# Patient Record
Sex: Male | Born: 1945
Health system: Southern US, Academic
[De-identification: ages and names within clinical notes are randomized; demographics above are authoritative.]

## PROBLEM LIST (undated history)

## (undated) ENCOUNTER — Telehealth

## (undated) ENCOUNTER — Encounter

## (undated) ENCOUNTER — Telehealth: Attending: Podiatrist

## (undated) ENCOUNTER — Telehealth: Attending: Nurse Practitioner

## (undated) ENCOUNTER — Other Ambulatory Visit

## (undated) ENCOUNTER — Encounter: Attending: Student in an Organized Health Care Education/Training Program

## (undated) ENCOUNTER — Encounter: Attending: Family Medicine

## (undated) ENCOUNTER — Encounter: Attending: Podiatrist

## (undated) ENCOUNTER — Telehealth: Attending: Cardiovascular Disease

## (undated) ENCOUNTER — Telehealth: Attending: Family Medicine

## (undated) ENCOUNTER — Encounter: Attending: Physician Assistant

## (undated) ENCOUNTER — Telehealth: Attending: Student in an Organized Health Care Education/Training Program

## (undated) ENCOUNTER — Encounter: Payer: MEDICARE | Attending: Student in an Organized Health Care Education/Training Program

## (undated) ENCOUNTER — Other Ambulatory Visit: Attending: Family Medicine

## (undated) ENCOUNTER — Ambulatory Visit

## (undated) ENCOUNTER — Encounter: Attending: "Endocrinology

## (undated) ENCOUNTER — Encounter: Attending: Nurse Practitioner

## (undated) ENCOUNTER — Encounter: Payer: MEDICARE | Attending: Nurse Practitioner

## (undated) ENCOUNTER — Inpatient Hospital Stay

## (undated) ENCOUNTER — Telehealth: Attending: "Endocrinology

## (undated) DIAGNOSIS — I10 Essential (primary) hypertension: Secondary | ICD-10-CM

## (undated) DIAGNOSIS — C801 Malignant (primary) neoplasm, unspecified: Secondary | ICD-10-CM

## (undated) DIAGNOSIS — I219 Acute myocardial infarction, unspecified: Secondary | ICD-10-CM

## (undated) DIAGNOSIS — N189 Chronic kidney disease, unspecified: Secondary | ICD-10-CM

## (undated) DIAGNOSIS — D759 Disease of blood and blood-forming organs, unspecified: Secondary | ICD-10-CM

## (undated) DIAGNOSIS — E119 Type 2 diabetes mellitus without complications: Secondary | ICD-10-CM

## (undated) DIAGNOSIS — T4145XA Adverse effect of unspecified anesthetic, initial encounter: Secondary | ICD-10-CM

## (undated) HISTORY — PX: BACK SURGERY: SHX140

---

## 1999-02-19 ENCOUNTER — Ambulatory Visit (HOSPITAL_COMMUNITY): Admission: RE | Admit: 1999-02-19 | Discharge: 1999-02-19 | Payer: Self-pay | Admitting: Family Medicine

## 1999-02-19 ENCOUNTER — Encounter: Payer: Self-pay | Admitting: Family Medicine

## 2014-04-25 DIAGNOSIS — T8859XA Other complications of anesthesia, initial encounter: Secondary | ICD-10-CM

## 2014-04-25 DIAGNOSIS — N189 Chronic kidney disease, unspecified: Secondary | ICD-10-CM

## 2014-04-25 HISTORY — DX: Chronic kidney disease, unspecified: N18.9

## 2014-04-25 HISTORY — PX: CORONARY ARTERY BYPASS GRAFT: SHX141

## 2014-04-25 HISTORY — DX: Other complications of anesthesia, initial encounter: T88.59XA

## 2014-11-13 ENCOUNTER — Other Ambulatory Visit: Payer: Self-pay | Admitting: Neurological Surgery

## 2014-11-18 ENCOUNTER — Encounter (HOSPITAL_COMMUNITY): Payer: Self-pay | Admitting: Family Medicine

## 2014-11-18 ENCOUNTER — Emergency Department (HOSPITAL_COMMUNITY): Payer: Medicare (Managed Care)

## 2014-11-18 ENCOUNTER — Inpatient Hospital Stay (HOSPITAL_COMMUNITY): Payer: Medicare (Managed Care)

## 2014-11-18 ENCOUNTER — Observation Stay (HOSPITAL_COMMUNITY)
Admission: EM | Admit: 2014-11-18 | Discharge: 2014-11-20 | Disposition: A | Discharge status: Home / Self Care | Payer: Medicare (Managed Care) | Attending: Internal Medicine | Admitting: Internal Medicine

## 2014-11-18 ENCOUNTER — Encounter (HOSPITAL_COMMUNITY): Payer: Self-pay | Admitting: *Deleted

## 2014-11-18 DIAGNOSIS — M542 Cervicalgia: Secondary | ICD-10-CM | POA: Diagnosis present

## 2014-11-18 DIAGNOSIS — I129 Hypertensive chronic kidney disease with stage 1 through stage 4 chronic kidney disease, or unspecified chronic kidney disease: Secondary | ICD-10-CM | POA: Insufficient documentation

## 2014-11-18 DIAGNOSIS — R42 Dizziness and giddiness: Secondary | ICD-10-CM

## 2014-11-18 DIAGNOSIS — N189 Chronic kidney disease, unspecified: Secondary | ICD-10-CM | POA: Diagnosis not present

## 2014-11-18 DIAGNOSIS — M5012 Cervical disc disorder with radiculopathy, mid-cervical region: Secondary | ICD-10-CM | POA: Insufficient documentation

## 2014-11-18 DIAGNOSIS — Z794 Long term (current) use of insulin: Secondary | ICD-10-CM

## 2014-11-18 DIAGNOSIS — Z79899 Other long term (current) drug therapy: Secondary | ICD-10-CM | POA: Insufficient documentation

## 2014-11-18 DIAGNOSIS — Z951 Presence of aortocoronary bypass graft: Secondary | ICD-10-CM | POA: Diagnosis not present

## 2014-11-18 DIAGNOSIS — IMO0001 Reserved for inherently not codable concepts without codable children: Secondary | ICD-10-CM

## 2014-11-18 DIAGNOSIS — N179 Acute kidney failure, unspecified: Secondary | ICD-10-CM | POA: Insufficient documentation

## 2014-11-18 DIAGNOSIS — Z85820 Personal history of malignant melanoma of skin: Secondary | ICD-10-CM | POA: Diagnosis not present

## 2014-11-18 DIAGNOSIS — I10 Essential (primary) hypertension: Secondary | ICD-10-CM | POA: Diagnosis present

## 2014-11-18 DIAGNOSIS — M4802 Spinal stenosis, cervical region: Secondary | ICD-10-CM | POA: Diagnosis not present

## 2014-11-18 DIAGNOSIS — R7989 Other specified abnormal findings of blood chemistry: Secondary | ICD-10-CM | POA: Diagnosis present

## 2014-11-18 DIAGNOSIS — Z9641 Presence of insulin pump (external) (internal): Secondary | ICD-10-CM

## 2014-11-18 DIAGNOSIS — R55 Syncope and collapse: Secondary | ICD-10-CM

## 2014-11-18 DIAGNOSIS — M4712 Other spondylosis with myelopathy, cervical region: Secondary | ICD-10-CM | POA: Diagnosis not present

## 2014-11-18 DIAGNOSIS — Z419 Encounter for procedure for purposes other than remedying health state, unspecified: Secondary | ICD-10-CM

## 2014-11-18 DIAGNOSIS — Z981 Arthrodesis status: Secondary | ICD-10-CM | POA: Insufficient documentation

## 2014-11-18 DIAGNOSIS — M5002 Cervical disc disorder with myelopathy, mid-cervical region: Secondary | ICD-10-CM | POA: Diagnosis not present

## 2014-11-18 DIAGNOSIS — I2510 Atherosclerotic heart disease of native coronary artery without angina pectoris: Secondary | ICD-10-CM | POA: Insufficient documentation

## 2014-11-18 DIAGNOSIS — E119 Type 2 diabetes mellitus without complications: Secondary | ICD-10-CM | POA: Insufficient documentation

## 2014-11-18 DIAGNOSIS — I252 Old myocardial infarction: Secondary | ICD-10-CM | POA: Diagnosis not present

## 2014-11-18 HISTORY — DX: Type 2 diabetes mellitus without complications: E11.9

## 2014-11-18 HISTORY — DX: Essential (primary) hypertension: I10

## 2014-11-18 LAB — COMPREHENSIVE METABOLIC PANEL
ALT: 19 U/L (ref 0–53)
ANION GAP: 7 (ref 5–15)
AST: 21 U/L (ref 0–37)
Albumin: 3.2 g/dL — ABNORMAL LOW (ref 3.5–5.2)
Alkaline Phosphatase: 64 U/L (ref 39–117)
BUN: 32 mg/dL — AB (ref 6–23)
CHLORIDE: 107 mmol/L (ref 96–112)
CO2: 22 mmol/L (ref 19–32)
CREATININE: 1.41 mg/dL — AB (ref 0.50–1.35)
Calcium: 8.5 mg/dL (ref 8.4–10.5)
GFR, EST AFRICAN AMERICAN: 58 mL/min — AB (ref >90)
GFR, EST NON AFRICAN AMERICAN: 50 mL/min — AB (ref >90)
Glucose, Bld: 408 mg/dL — ABNORMAL HIGH (ref 70–99)
Potassium: 4.2 mmol/L (ref 3.5–5.1)
SODIUM: 136 mmol/L (ref 135–145)
Total Bilirubin: 0.4 mg/dL (ref 0.3–1.2)
Total Protein: 5.5 g/dL — ABNORMAL LOW (ref 6.0–8.3)

## 2014-11-18 LAB — GLUCOSE, CAPILLARY
GLUCOSE-CAPILLARY: 220 mg/dL — AB (ref 70–99)
Glucose-Capillary: 350 mg/dL — ABNORMAL HIGH (ref 70–99)

## 2014-11-18 LAB — CBC WITH DIFFERENTIAL/PLATELET
BASOS PCT: 0 % (ref 0–1)
Basophils Absolute: 0 10*3/uL (ref 0.0–0.1)
EOS ABS: 0.1 10*3/uL (ref 0.0–0.7)
Eosinophils Relative: 2 % (ref 0–5)
HCT: 40 % (ref 39.0–52.0)
HEMOGLOBIN: 14 g/dL (ref 13.0–17.0)
Lymphocytes Relative: 24 % (ref 12–46)
Lymphs Abs: 1.3 10*3/uL (ref 0.7–4.0)
MCH: 32.9 pg (ref 26.0–34.0)
MCHC: 35 g/dL (ref 30.0–36.0)
MCV: 94.1 fL (ref 78.0–100.0)
Monocytes Absolute: 0.7 10*3/uL (ref 0.1–1.0)
Monocytes Relative: 13 % — ABNORMAL HIGH (ref 3–12)
NEUTROS PCT: 61 % (ref 43–77)
Neutro Abs: 3.3 10*3/uL (ref 1.7–7.7)
Platelets: 195 10*3/uL (ref 150–400)
RBC: 4.25 MIL/uL (ref 4.22–5.81)
RDW: 13 % (ref 11.5–15.5)
WBC: 5.4 10*3/uL (ref 4.0–10.5)

## 2014-11-18 LAB — CBG MONITORING, ED: Glucose-Capillary: 78 mg/dL (ref 70–99)

## 2014-11-18 LAB — TROPONIN I: Troponin I: <0.03 ng/mL (ref <0.031)

## 2014-11-18 LAB — I-STAT TROPONIN, ED: Troponin i, poc: 0.01 ng/mL (ref 0.00–0.08)

## 2014-11-18 LAB — SURGICAL PCR SCREEN
MRSA, PCR: NEGATIVE
STAPHYLOCOCCUS AUREUS: NEGATIVE

## 2014-11-18 MED ORDER — MECLIZINE HCL 12.5 MG PO TABS
12.5000 mg | ORAL_TABLET | Freq: Two times a day (BID) | ORAL | Status: DC
  Administered 2014-11-18 – 2014-11-20 (×4): 12.5 mg via ORAL
  Filled 2014-11-18 (×5): qty 1

## 2014-11-18 MED ORDER — HEPARIN SODIUM (PORCINE) 5000 UNIT/ML IJ SOLN
5000.0000 [IU] | Freq: Three times a day (TID) | INTRAMUSCULAR | Status: DC
  Administered 2014-11-18 – 2014-11-20 (×3): 5000 [IU] via SUBCUTANEOUS
  Filled 2014-11-18 (×5): qty 1

## 2014-11-18 MED ORDER — INSULIN PUMP
Freq: Three times a day (TID) | SUBCUTANEOUS | Status: DC
  Administered 2014-11-18: 23:00:00 via SUBCUTANEOUS
  Filled 2014-11-18: qty 1

## 2014-11-18 MED ORDER — ASPIRIN 81 MG PO CHEW: 81.0000 mg | CHEWABLE_TABLET | Freq: Every day | ORAL | Status: DC

## 2014-11-18 MED ORDER — GABAPENTIN 300 MG PO CAPS
300.0000 mg | ORAL_CAPSULE | Freq: Every day | ORAL | Status: DC
  Administered 2014-11-18 – 2014-11-20 (×3): 300 mg via ORAL
  Filled 2014-11-18 (×3): qty 1

## 2014-11-18 MED ORDER — OXYCODONE-ACETAMINOPHEN 10-325 MG PO TABS
1.0000 | ORAL_TABLET | ORAL | Status: DC | PRN
Start: 2014-11-18 — End: 2014-11-18

## 2014-11-18 MED ORDER — SODIUM CHLORIDE 0.9 % IV SOLN
INTRAVENOUS | Status: DC
  Administered 2014-11-18: 23:00:00 via INTRAVENOUS
  Administered 2014-11-19: 1000 mL via INTRAVENOUS

## 2014-11-18 MED ORDER — INSULIN ASPART 100 UNIT/ML ~~LOC~~ SOLN
0.0000 [IU] | Freq: Three times a day (TID) | SUBCUTANEOUS | Status: DC
  Administered 2014-11-20: 5 [IU] via SUBCUTANEOUS
  Administered 2014-11-20: 9 [IU] via SUBCUTANEOUS

## 2014-11-18 MED ORDER — INSULIN PUMP
Freq: Three times a day (TID) | SUBCUTANEOUS | Status: DC
  Filled 2014-11-18: qty 1

## 2014-11-18 MED ORDER — ALUM & MAG HYDROXIDE-SIMETH 200-200-20 MG/5ML PO SUSP: 30.0000 mL | Freq: Four times a day (QID) | ORAL | Status: DC | PRN

## 2014-11-18 MED ORDER — DEXTROSE-NACL 5-0.45 % IV SOLN: INTRAVENOUS | Status: DC

## 2014-11-18 MED ORDER — ONDANSETRON HCL 4 MG PO TABS: 4.0000 mg | ORAL_TABLET | Freq: Four times a day (QID) | ORAL | Status: DC | PRN

## 2014-11-18 MED ORDER — SODIUM CHLORIDE 0.9 % IJ SOLN
3.0000 mL | Freq: Two times a day (BID) | INTRAMUSCULAR | Status: DC
  Administered 2014-11-18 – 2014-11-20 (×3): 3 mL via INTRAVENOUS

## 2014-11-18 MED ORDER — OXYCODONE HCL 5 MG PO TABS
5.0000 mg | ORAL_TABLET | ORAL | Status: DC | PRN
  Administered 2014-11-18: 5 mg via ORAL
  Filled 2014-11-18 (×2): qty 1

## 2014-11-18 MED ORDER — METOPROLOL TARTRATE 25 MG PO TABS
25.0000 mg | ORAL_TABLET | Freq: Two times a day (BID) | ORAL | Status: DC
  Administered 2014-11-18 – 2014-11-20 (×4): 25 mg via ORAL
  Filled 2014-11-18 (×5): qty 1

## 2014-11-18 MED ORDER — PANTOPRAZOLE SODIUM 40 MG PO TBEC
40.0000 mg | DELAYED_RELEASE_TABLET | Freq: Every day | ORAL | Status: DC
  Administered 2014-11-19 – 2014-11-20 (×2): 40 mg via ORAL
  Filled 2014-11-18 (×2): qty 1

## 2014-11-18 MED ORDER — SUCCINYLCHOLINE CHLORIDE 20 MG/ML IJ SOLN: INTRAMUSCULAR | Status: DC | PRN

## 2014-11-18 MED ORDER — PRAVASTATIN SODIUM 40 MG PO TABS
40.0000 mg | ORAL_TABLET | Freq: Every day | ORAL | Status: DC
  Administered 2014-11-18: 40 mg via ORAL
  Filled 2014-11-18 (×3): qty 1

## 2014-11-18 MED ORDER — SODIUM CHLORIDE 0.9 % IV SOLN: INTRAVENOUS | Status: DC

## 2014-11-18 MED ORDER — INSULIN REGULAR BOLUS VIA INFUSION
0.0000 [IU] | Freq: Three times a day (TID) | INTRAVENOUS | Status: DC
  Filled 2014-11-18: qty 10

## 2014-11-18 MED ORDER — ONDANSETRON HCL 4 MG/2ML IJ SOLN: 4.0000 mg | Freq: Four times a day (QID) | INTRAMUSCULAR | Status: DC | PRN

## 2014-11-18 MED ORDER — DOCUSATE SODIUM 100 MG PO CAPS
100.0000 mg | ORAL_CAPSULE | Freq: Two times a day (BID) | ORAL | Status: DC
  Administered 2014-11-18 – 2014-11-20 (×3): 100 mg via ORAL
  Filled 2014-11-18 (×5): qty 1

## 2014-11-18 MED ORDER — SODIUM CHLORIDE 0.9 % IV SOLN
INTRAVENOUS | Status: DC
  Filled 2014-11-18: qty 2.5

## 2014-11-18 MED ORDER — DEXTROSE 50 % IV SOLN: 25.0000 mL | INTRAVENOUS | Status: DC | PRN

## 2014-11-18 MED ORDER — ACETAMINOPHEN 325 MG PO TABS
650.0000 mg | ORAL_TABLET | Freq: Four times a day (QID) | ORAL | Status: DC | PRN
  Administered 2014-11-19 (×2): 650 mg via ORAL
  Filled 2014-11-18 (×2): qty 2

## 2014-11-18 MED ORDER — ETOMIDATE 2 MG/ML IV SOLN: INTRAVENOUS | Status: DC | PRN

## 2014-11-18 MED ORDER — OXYCODONE-ACETAMINOPHEN 5-325 MG PO TABS
1.0000 | ORAL_TABLET | ORAL | Status: DC | PRN
  Administered 2014-11-18: 1 via ORAL
  Filled 2014-11-18 (×2): qty 1

## 2014-11-18 MED ORDER — ACETAMINOPHEN 650 MG RE SUPP: 650.0000 mg | Freq: Four times a day (QID) | RECTAL | Status: DC | PRN

## 2014-11-18 MED ORDER — TAMSULOSIN HCL 0.4 MG PO CAPS
0.4000 mg | ORAL_CAPSULE | Freq: Every day | ORAL | Status: DC
  Administered 2014-11-18: 0.4 mg via ORAL
  Filled 2014-11-18 (×3): qty 1

## 2014-11-18 NOTE — Progress Notes (Signed)
Per ED notes written today, John Patton had open heart surgery Oct 2016.  I called John Patton at Dr Clarice Pole office and ask for any  information from that surgery.  John Patton reported that they did not have any information, but she would work on it.  I went to  Ed and saw patient and had him sign release t to send to [facility] and  Dr Truman Hayward, cardiothoracic surgeon.   I faxed request for information to both locations.  John Patton said that his post op visit, I help a visitor by getting a wheel chair and pushing him to the office he was going to be seen in.  "It was a good distance and I did fine, Dr Truman Hayward happened to witness this and said that I did not need any other testing since I did this withtout any discomfort or SOB and he released me."  John Patton is not sure who cardiologist is, "someone in Dr Marguerita Beards office."

## 2014-11-18 NOTE — ED Notes (Signed)
Spoke to [employer] Aware of pt's cbg and that pt is holding shortly for MD to eval prior to transport floor

## 2014-11-18 NOTE — ED Notes (Signed)
Paged MD to make aware of cbg and for her to come see pt prior to transport to floor per her request. Will hold pt in ED for short time

## 2014-11-18 NOTE — Progress Notes (Signed)
Pt going for sx tomorrow per note. Pt with no orders for NPO after midnight, has order for insulin pump as well as SSI. K Baltazar Najjar paged and ordered to stop insulin pump at midnight and check pt CBG Q4. Will continue to monitor. Ronnette Hila, RN

## 2014-11-18 NOTE — Consult Note (Signed)
CHIEF COMPLAINT:                                          Dysesthesias in the upper extremities, neck pain.  HISTORY OF PRESENT ILLNESS:                     John Patton is an old patient of mine who I had seen years ago for spondylosis of the cervical spine.  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  Mr. Frisina tells me that last year he had open heart surgery.  During his emergence from surgery he recalls becoming aware that his neck was hyperextended while he still had a breathing tube and he was feeling a glass like sensation of pins being hooked in the back of his neck.  He struggled to try to get himself free and he notes that this only yielded more discomfort.  Ultimately he recovered from that surgery but he notes that since that time he has been having chronic severe migraines with continued dysesthesias.  He was worked up by a physician locally and an MRI of his neck was performed on March 25 of this year.  This study demonstrates that Mr. 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  I demonstrated the findings to him.  REVIEW OF SYSTEMS:                                    Notable for hearing loss, balance disturbance, high blood pressure, arm weakness, neck pain, diabetes being insulin dependent using a pump.  He has a history of some skin disease and skin cancers resected a couple of times.  He also notes that he has the chronic headaches.  PAST MEDICAL HISTORY:                                Reveals that John Patton has had significant spondylosis in his neck.  He has had coronary artery disease requiring heart surgery in October of last year.  . Medications and Allergies:  His current medications include gabapentin, glucose strips for checking his sugar, insulin, losartan, metoprolol, omeprazole, oxycodone, pravastatin, sildenafil, tamsulosin, topiramate.  He  notes allergies to metformin which gives him stomach reaction.  He does not tolerate nonsteroidal anti-inflammatories.  PHYSICAL EXAMINATION:                                On examination, I note that [name] has Lhermitte's phenomenon every time he turns his neck slightly to the right into the left.  His motor function in the upper extremities reveal 4/5 strength in his grips, his intrinsics, his deltoids, his biceps and his triceps.  He has absent reflexes in the upper extremities and in the lower extremities.  He walks with a mildly wide-based gait.  IMPRESSION:  At this point given the fact that he is having significant signs of myelopathy, I have advised [name] that he should undergo one level anterior decompression and arthrodesis at the level of C4-C5.  The surgery would be not unlike what he had experienced previously where we removed the disk in its entirety, put a bone graft in and a small titanium plate to hold his neck together.  Surgery would be done through the left side of the neck.  Because he is having significant though modest symptoms, I suggested that we proceed with this at the earliest convenience.  My concern is though that John Patton could have some modest injury that could result in significant spinal cord injury, possibly even paralysis. He is scheduled for the surgery tomorrow.  He presented to the emergency room today having had several episodes of falling down. He describes symptoms that may be consistent with vertigo but is unclear the exact nature of his episodes. He has had recent open heart surgery a year ago. Some preoperative evaluation would be appropriate as directed by the hospitalist service. I'm hopeful that we will be able to proceed with a surgical decompression tomorrow as planned. Agree with proceeding with an MRI of the brain.

## 2014-11-18 NOTE — H&P (Signed)
[facility] History and Physical  John Patton CVE:[id number] DOB: August 21, 1945 DOA: 11/18/2014  Referring physician: Dr Ezequiel Essex PCP: No primary care provider on file.   Chief Complaint: presyncope/vertigo  HPI: John Patton is a 69 y.o. male with a PMH of hypertension, diabetes (insulin pump), CABG, and neck pain.  Patient states upon awakening this morning the room started spinning and he fell to the ground.  He did not lose consciousness, and reports that he was able to gradually "sit down".  He has been having episodes where he feels like the room is spinning since February 2016.  The episodes last 15-20 minutes before he feels like he is able to safely walk again.  He can not identify any type of prodrome associated with the falls.  He believes that it is due to a neck injury that he sustained during his CABG last October.  He lives in [place], and is in [place] to undergo anterior decompression and arthrodesis at the level of C4-C5  with Dr Ellene Route which is scheduled for tomorrow.    In the ER today(11/18/14) he presented with a blood glucose today of 408.  A CT of the cervical spine was performed with no evidence of intracranial injury or cervical spine fracture.  Some degenerative changes noted.  No evidence of intracranial injury on head CT.    Review of Systems:  Constitutional:  No weight loss, night sweats, Fevers, chills, fatigue.  HEENT:  + headaches,No Difficulty swallowing, Sore throat,  No sneezing, itching, ear ache, nasal congestion, post nasal drip,  Cardio-vascular:  No chest pain, Orthopnea,  swelling in lower extremities, anasarca, palpitations GI:  No heartburn, indigestion, abdominal pain, nausea, vomiting, diarrhea, loss of appetite  Resp:  No shortness of breath with exertion or at rest  Skin:  no rash or lesions.  GU:  no dysuria, change in color of urine, no urgency or frequency.   Musculoskeletal:  No joint pain or swelling. No decreased range of  motion.  + right sided upper and lower extremity weakness, +neck pain Psych:  No change in mood or affect. No depression or anxiety. No memory loss.   Past Medical History  Diagnosis Date  . Hypertension   . Diabetes mellitus without complication   . Complication of anesthesia 04/2014    "while being extubated, they broke my previous cervical fracture."   Past Surgical History  Procedure Laterality Date  . Back surgery     Social History:  reports that he has never smoked. He does not have any smokeless tobacco history on file. He reports that he does not drink alcohol. His drug history is not on file.   Patient does not use recreational drugs.  He used to drink wine regularly- but reports he no longer drinks.  Active Allergies: Promethazine   Family history:  Prior to Admission medications   Medication Sig Start Date End Date Taking? Authorizing Provider  gabapentin (NEURONTIN) 300 MG capsule Take 300 mg by mouth daily. 09/30/14  Yes Historical Provider, MD  HYDROcodone-acetaminophen (NORCO/VICODIN) 5-325 MG per tablet Take 1 tablet by mouth every 6 (six) hours as needed. pain 10/07/14  Yes Historical Provider, MD  losartan (COZAAR) 100 MG tablet Take 100 mg by mouth daily. 11/15/14  Yes Historical Provider, MD  metoprolol tartrate (LOPRESSOR) 25 MG tablet Take 25 mg by mouth 2 (two) times daily. 11/15/14  Yes Historical Provider, MD  omeprazole (PRILOSEC) 40 MG capsule Take 40 mg by mouth daily as needed. heartburn  10/04/14  Yes Historical Provider, MD  oxyCODONE-acetaminophen (PERCOCET) 10-325 MG per tablet Take 1 tablet by mouth every 4 (four) hours as needed. pain 11/15/14  Yes Historical Provider, MD  pravastatin (PRAVACHOL) 40 MG tablet Take 40 mg by mouth daily. 10/07/14  Yes Historical Provider, MD  tamsulosin (FLOMAX) 0.4 MG CAPS capsule Take 0.4 mg by mouth daily. 09/30/14  Yes Historical Provider, MD   Physical Exam: Filed Vitals:   11/18/14 0940 11/18/14 0945 11/18/14 1100  11/18/14 1140  BP: 149/77 140/76 166/103 189/89  Pulse: 70 69 64 62  Temp: 97.9 F (36.6 C)     Resp: 18   18  Height: 5\' 10"  (1.778 m)     Weight: 82.555 kg (182 lb)     SpO2: 96% 94% 98% 98%    Wt Readings from Last 3 Encounters:  11/18/14 82.555 kg (182 lb)    General:  Patient is alert and oriented sitting up in bed eating a sandwich.   Eyes: PERRL, normal lids, irises & conjunctiva ENT: grossly normal hearing,+dry lips & tongue Neck: no LAD, masses or thyromegaly Cardiovascular: RRR, no m/r/g. No LE edema.  Respiratory: CTA bilaterally, no w/r/r. Normal air movement bilaterally.  Abdomen: soft, ntnd.  No organomegaly.   Skin: no rash or induration noted  Musculoskeletal: + Right sided upper and lower extremity weakness  Psychiatric: grossly normal mood and affect, speech fluent and appropriate Neurologic: grossly non-focal.          Labs on Admission:  Basic Metabolic Panel:  Recent Labs Lab 11/18/14 1026  NA 136  K 4.2  CL 107  CO2 22  GLUCOSE 408*  BUN 32*  CREATININE 1.41*  CALCIUM 8.5   Liver Function Tests:  Recent Labs Lab 11/18/14 1026  AST 21  ALT 19  ALKPHOS 64  BILITOT 0.4  PROT 5.5*  ALBUMIN 3.2*   CBC:  Recent Labs Lab 11/18/14 1026  WBC 5.4  NEUTROABS 3.3  HGB 14.0  HCT 40.0  MCV 94.1  PLT 195   Cardiac Enzymes:  Recent Labs Lab 11/18/14 1026  TROPONINI <0.03    Radiological Exams on Admission: Dg Chest 2 View  11/18/2014   CLINICAL DATA:  Preoperative examination. Patient for cervical surgery tomorrow.  EXAM: CHEST  2 VIEW  COMPARISON:  None.  FINDINGS: The patient is status post CABG. The lungs are clear. Heart size is normal. No pneumothorax or pleural effusion. Remote right seventh rib fracture is noted.  IMPRESSION: No acute disease.   Electronically Signed   By: Inge Rise M.D.   On: 11/18/2014 11:20   Ct Head Wo Contrast  11/18/2014   CLINICAL DATA:  Fall with dizziness and headache. Neck pain. Initial  encounter.  EXAM: CT HEAD WITHOUT CONTRAST  CT CERVICAL SPINE WITHOUT CONTRAST  TECHNIQUE: Multidetector CT imaging of the head and cervical spine was performed following the standard protocol without intravenous contrast. Multiplanar CT image reconstructions of the cervical spine were also generated.  COMPARISON:  None.  FINDINGS: CT HEAD FINDINGS  Skull and Sinuses:Negative for fracture or destructive process. The mastoids, middle ears, and imaged paranasal sinuses are clear.  Orbits: No acute abnormality.  Brain: No evidence of acute infarction, hemorrhage, hydrocephalus, or mass lesion/mass effect. Few small low densities in the bilateral centrum semiovale, nonspecific but usually from chronic small vessel disease .  CT CERVICAL SPINE FINDINGS  No evidence of acute fracture or traumatic malalignment. No prevertebral edema or gross cervical canal hematoma.  Status post  C5-6 and C7-T1 discectomy with complete bony fusion. Attempted discectomy at C6-7, but the disc space is open and degenerated with left uncovertebral spurring causing moderate foramina stenosis. There is also a notable left paracentral disc protrusion at C4-5, contacting and deforming the cord.  IMPRESSION: 1. No evidence of intracranial injury or cervical spine fracture. 2. Cervical spine postoperative and degenerative changes noted above, including a C4-5 disc herniation with moderate canal stenosis. Reference outside cervical spine MRI 10/18/2014.   Electronically Signed   By: Monte Fantasia M.D.   On: 11/18/2014 11:05   Ct Cervical Spine Wo Contrast  11/18/2014   CLINICAL DATA:  Fall with dizziness and headache. Neck pain. Initial encounter.  EXAM: CT HEAD WITHOUT CONTRAST  CT CERVICAL SPINE WITHOUT CONTRAST  TECHNIQUE: Multidetector CT imaging of the head and cervical spine was performed following the standard protocol without intravenous contrast. Multiplanar CT image reconstructions of the cervical spine were also generated.  COMPARISON:   None.  FINDINGS: CT HEAD FINDINGS  Skull and Sinuses:Negative for fracture or destructive process. The mastoids, middle ears, and imaged paranasal sinuses are clear.  Orbits: No acute abnormality.  Brain: No evidence of acute infarction, hemorrhage, hydrocephalus, or mass lesion/mass effect. Few small low densities in the bilateral centrum semiovale, nonspecific but usually from chronic small vessel disease .  CT CERVICAL SPINE FINDINGS  No evidence of acute fracture or traumatic malalignment. No prevertebral edema or gross cervical canal hematoma.  Status post C5-6 and C7-T1 discectomy with complete bony fusion. Attempted discectomy at C6-7, but the disc space is open and degenerated with left uncovertebral spurring causing moderate foramina stenosis. There is also a notable left paracentral disc protrusion at C4-5, contacting and deforming the cord.  IMPRESSION: 1. No evidence of intracranial injury or cervical spine fracture. 2. Cervical spine postoperative and degenerative changes noted above, including a C4-5 disc herniation with moderate canal stenosis. Reference outside cervical spine MRI 10/18/2014.   Electronically Signed   By: Monte Fantasia M.D.   On: 11/18/2014 11:05    EKG: Independently reviewed. Sinus rhythm of 69. Atrial premature complex.   Assessment/Plan Principal Problem: Episodic  Vertigo  -Sounds like peripheral vertigo -at least 4 episodes of vertigo and fall over the past 3-4 months. -episodes last from 5 - 20 minutes with no prodrome.  Result in sudden falls -consulted neurosurgery, start meclizine, MR brain, check 2D echo.  PT consulted. -Medications could be exacerbating episodes (percocet, gabapentin, vicodin)  Active Problems:   Elevated serum creatinine and BUN -serum creatinine of 1.41.  No baseline creatinine in epic -likely due to dehydration or kidney injury due to poorly controlled diabetes  -IV fluids, check U/A.   Hold ARB. -follow     Cervical stenosis of  spine -seen on ct scan of cervical spine -scheduled for surgery tomorrow with Dr Ellene Route -Preoperative risk assessment / clearance: The patient has had no angina, dyspnea, or palpitations.  He is highly functional and able to walk up and down stairs with no difficulty. EKG shows no Q waves or significant ST-segment elevation or depression.  2D echo is pending.  Given his recent CABG he has a moderate perioperative risk for MI.    HTN (hypertension) -blood pressure of 189/89 mmHg in ER today -Hydralazine as needed, continue metoprolol.  Hold ARB due to elevated creatinine.    IDDM (insulin dependent diabetes mellitus) -blood glucose of 408 -consulted diabetes coordinator  -Insulin pump disconnected.  Started on iv insulin drip    CAD  with CABG in 04/2014 Add daily aspirin.  Continue pravastatin.  Consults:  Dr. Ellene Route (Neurosurgery) and Diabetes coordinator for insulin pump and recs.  Code Status: Full  DVT Prophylaxis: Heparin Family Communication: Spoke with patient.  Significant other, Darlene, at bedside. Disposition Plan: To be determined by Neurosurgery.  Time spent: 60 min.  Marlou Starks [place] PA-S Imogene Burn, [place] [facility] Pager [phone]

## 2014-11-18 NOTE — Progress Notes (Signed)
Anesthesia Chart Review: Patient is a 68 year old male currently scheduled for C4-5 ACDF tomorrow by Dr. Ellene Route.  He seen in the ED this morning vertigo/pre-syncope and is being admitted by [facility] for further evaluation and pre-operative recommendations.  Echo is pending, but current H&P from [facility], PA-C (awaiting co-sign) felt that given his recent CABG, he is a moderate perioperative risk for MI.   He primarily resides in [place], [place], but comes to [facility] for Neurosurgery care. He had prior C5-6, C6-7 ACDF secondary to spinal cord compromise. Reportedly, during CABG 05/13/14, his neck was hyperextended presumable during intubation and has since had issues with dysesthesias and severe migraines. Due to significant myelopathy, Dr. Ellene Route has recommended proceeding with C4-5 ACDF at the earliest convenience, as a modest injury could result in significant spinal cord injury.    Other history includes CAD/MI s/p CABG X 3 with L[facility] 05/13/14 (LIMA to LAD, SVG to OM, SVG to PDA; Dr. Synetta Shadow, [facility], [facility]), DM2 with insulin pump, CKD, HTN, non-smoker, melanoma excision of the scalp.   Meds include gabapentin, Norco, losartan, metoprolol tartrate, omeprazole, Percocet, pravastatin, tamsulosin.   11/18/14 EKG: SR, PACs, possible lateral infarct (age undetermined), anteroseptal infarct (age undetermined), Baseline wanderer in leads I, II, III, aVR, V4. The only comparison EKG currently available is from POD#1 CABG on 05/14/14 ([facility]) which showed anterior ST elevation and right BBB. Q waves in high lateral leads and inverted T waves in septal leads are now present.  Pre-CABG cath on 05/10/14 (Dr. Marlowe Sax) showed: Severe heavily calcifications, severe multivessel CAD, preserved LVF. 60-70% stenosis proximal midsection RCA. 90% distal focal RCA. 50% LM. 80% mid LAD. 80% mid CX.   05/10/14 Carotid duplex ([facility]): < 5% BICA stenosis.    05/13/14 Spirometry ([facility]): Spirometry shows an FEV1 of 2.36 or 70% of predicted. FVC is 2.96 or 65% of predicted. FEV1/FVC ratio is 80%. Symmetric reduction in FEV1 and FVC may be suggestive of a mild restrictive defect, would get full lung volmes to further evaluate (Dr.  Tobin Chad).   11/18/14 CXR: FINDINGS: The patient is status post CABG. The lungs are clear. Heart size is normal. No pneumothorax or pleural effusion. Remote right seventh rib fracture is noted.IMPRESSION: No acute disease.  Labs from 11/18/14 noted.  Glucose elevated at 408.  Cr 1.41 which appears consistent with labs from 04/2014 from [facility].  CBC WNL.   Patient did not come through PAT (admitted thru the ED), so I have not evaluated Mr. Sample.  He will ultimately be evaluated by one of our anesthesiologist.  Hopefully echo report will be available by then, and IM will have final pre-operative recommendations.  George Hugh [facility] Short Stay Center/Anesthesiology Phone [phone] 11/18/2014 4:40 PM

## 2014-11-18 NOTE — ED Notes (Signed)
Pt having neck pain, dizziness and the room was spinning this am. sts he fell. Pt sts he is suppose to have surgery tomorrow on spine. sts Dr Ellene Route is doing surgery.

## 2014-11-18 NOTE — Progress Notes (Addendum)
Inpatient Diabetes Program Recommendations  AACE/ADA: New Consensus Statement on Inpatient Glycemic Control (2013)  Target Ranges:  Prepandial:   less than 140 mg/dL      Peak postprandial:   less than 180 mg/dL (1-2 hours)      Critically ill patients:  140 - 180 mg/dL    Inpatient Diabetes Program Recommendations Insulin - IV drip/GlucoStabilizer: Please consider using the IV insulin drip to normalize patient's glucose. Please start the drip and then have patient removie the pump insertion set.Marland Kitchen Pt may need to remain on the drip thrugh his surgery.  Spoke with RN, Maryan (sp?). Pt ordered MRI in the next few hours-this affirms the need for using the IV insulin drip per gluco-stabilizer as the insulin pump cannot be taken into the MRI  Will follow and assess throughout the day. If possible, will see patient today.   Thank you Rosita Kea, RN, MSN, CDE  Diabetes Inpatient Program Office: [phone] Pager: [phone] 8:00 am to 5:00 pm

## 2014-11-18 NOTE — Progress Notes (Signed)
Dr. Olen Pel paged regarding patient most recent CBG and pt not being on insulin drip brought from ED.   Maurene Capes RN

## 2014-11-18 NOTE — Progress Notes (Signed)
Pt high fall risk per recent hx of near syncope. Pt states he is independent and is refusing to use bed alarm. Pt states he has not passed out or fallen. Pt refusing video monitor. Pt is steady on feet, states if he feels dizzy he will notify RN.

## 2014-11-18 NOTE — ED Provider Notes (Signed)
CSN: [id number]     Arrival date & time 11/18/14  3614 History   First MD Initiated Contact with Patient 11/18/14 [phone]     Chief Complaint  Patient presents with  . Neck Pain     (Consider location/radiation/quality/duration/timing/severity/associated sxs/prior Treatment) HPI Comments: Patient complains of worsening of his chronic neck pain since falling this morning after getting out of bed. He states he got up and took a few steps and fell to the ground without warning striking his head and neck at the foot of the bed. Uncertain if he lost consciousness. Denies any preceding prodrome of dizziness or lightheadedness. No chest pain or shortness of breath. Patient with known neck injury and is scheduled for surgery tomorrow with Dr. Ellene Route. States his right arm and leg weakness are at baseline. Patient injured his neck in October while he was having surgery for his CABG. He denies any new weakness, numbness or tingling no bowel or bladder incontinence. No fever or vomiting. Patient states since October he said 4 separate episodes of losing consciousness without prodrome and has not had these evaluated. He normally lives in [place] drove to [place] yesterday in preparation for his surgery.  The history is provided by the patient.    Past Medical History  Diagnosis Date  . Hypertension   . Complication of anesthesia 04/2014    "while being extubated, they broke my previous cervical fracture."  . Myocardial infarction   . Blood dyscrasia   . Cancer     melanoma- top of scalp  . Chronic kidney disease 04/2014    AKF  . Diabetes mellitus without complication     Insulin Pump   Past Surgical History  Procedure Laterality Date  . Back surgery    . 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 Synetta Shadow; [place], [place]   History reviewed. No pertinent family history. History  Substance Use Topics  . Smoking status: Never  Smoker   . Smokeless tobacco: Not on file  . Alcohol Use: No    Review of Systems  Constitutional: Negative for fever, activity change and appetite change.  HENT: Negative for congestion and rhinorrhea.   Eyes: Negative for visual disturbance.  Respiratory: Negative for chest tightness.   Cardiovascular: Negative for chest pain.  Gastrointestinal: Negative for nausea, vomiting and abdominal pain.  Genitourinary: Negative for dysuria and hematuria.  Musculoskeletal: Positive for neck pain.  Skin: Negative for rash.  Neurological: Positive for headaches. Negative for dizziness and light-headedness.  Hematological: Negative for adenopathy.  A complete 10 system review of systems was obtained and all systems are negative except as noted in the HPI and PMH.      Allergies  Review of patient's allergies indicates no active allergies.  Home Medications   Prior to Admission medications   Medication Sig Start Date End Date Taking? Authorizing Provider  gabapentin (NEURONTIN) 300 MG capsule Take 300 mg by mouth daily. 09/30/14  Yes Historical Provider, MD  HYDROcodone-acetaminophen (NORCO/VICODIN) 5-325 MG per tablet Take 1 tablet by mouth every 6 (six) hours as needed. pain 10/07/14  Yes Historical Provider, MD  losartan (COZAAR) 100 MG tablet Take 100 mg by mouth daily. 11/15/14  Yes Historical Provider, MD  metoprolol tartrate (LOPRESSOR) 25 MG tablet Take 25 mg by mouth 2 (two) times daily. 11/15/14  Yes Historical Provider, MD  omeprazole (PRILOSEC) 40 MG capsule Take 40 mg by mouth  daily as needed. heartburn 10/04/14  Yes Historical Provider, MD  oxyCODONE-acetaminophen (PERCOCET) 10-325 MG per tablet Take 1 tablet by mouth every 4 (four) hours as needed. pain 11/15/14  Yes Historical Provider, MD  pravastatin (PRAVACHOL) 40 MG tablet Take 40 mg by mouth daily. 10/07/14  Yes Historical Provider, MD  tamsulosin (FLOMAX) 0.4 MG CAPS capsule Take 0.4 mg by mouth daily. 09/30/14  Yes Historical  Provider, MD   BP 164/88 mmHg  Pulse 66  Temp(Src) 97.7 F (36.5 C) (Oral)  Resp 18  Ht 5\' 10"  (1.778 m)  Wt 179 lb 11.2 oz (81.511 kg)  BMI 25.78 kg/m2  SpO2 100% Physical Exam  Constitutional: He is oriented to person, place, and time. He appears well-developed and well-nourished. No distress.  HENT:  Head: Normocephalic and atraumatic.  Mouth/Throat: Oropharynx is clear and moist. No oropharyngeal exudate.  Eyes: Conjunctivae and EOM are normal. Pupils are equal, round, and reactive to light.  Neck: Normal range of motion. Neck supple.  R paraspinal neck tenderness. Upper midline tenderness  Cardiovascular: Normal rate, regular rhythm, normal heart sounds and intact distal pulses.   No murmur heard. Pulmonary/Chest: Effort normal and breath sounds normal. No respiratory distress.  Abdominal: Soft. There is no tenderness. There is no rebound and no guarding.  Musculoskeletal: Normal range of motion. He exhibits no edema or tenderness.  Neurological: He is alert and oriented to person, place, and time. No cranial nerve deficit. He exhibits normal muscle tone. Coordination normal.  No ataxia on finger to nose bilaterally. No pronator drift. 4/5 strength RUE, RLE, baseline per patient. 5/5 strength LUE and LLE. CN 2-12 intact. Weaker R grip strength Sensation intact.  Skin: Skin is warm.  Psychiatric: He has a normal mood and affect. His behavior is normal.  Nursing note and vitals reviewed.   ED Course  Procedures (including critical care time) Labs Review Labs Reviewed  CBC WITH DIFFERENTIAL/PLATELET - Abnormal; Notable for the following:    Monocytes Relative 13 (*)    All other components within normal limits  COMPREHENSIVE METABOLIC PANEL - Abnormal; Notable for the following:    Glucose, Bld 408 (*)    BUN 32 (*)    Creatinine, Ser 1.41 (*)    Total Protein 5.5 (*)    Albumin 3.2 (*)    GFR calc non Af Amer 50 (*)    GFR calc Af Amer 58 (*)    All other components  within normal limits  SURGICAL PCR SCREEN  TROPONIN I  URINALYSIS, ROUTINE W REFLEX MICROSCOPIC  BASIC METABOLIC PANEL  I-STAT TROPOININ, ED  CBG MONITORING, ED    Imaging Review Dg Chest 2 View  11/18/2014   CLINICAL DATA:  Preoperative examination. Patient for cervical surgery tomorrow.  EXAM: CHEST  2 VIEW  COMPARISON:  None.  FINDINGS: The patient is status post CABG. The lungs are clear. Heart size is normal. No pneumothorax or pleural effusion. Remote right seventh rib fracture is noted.  IMPRESSION: No acute disease.   Electronically Signed   By: Inge Rise M.D.   On: 11/18/2014 11:20   Ct Head Wo Contrast  11/18/2014   CLINICAL DATA:  Fall with dizziness and headache. Neck pain. Initial encounter.  EXAM: CT HEAD WITHOUT CONTRAST  CT CERVICAL SPINE WITHOUT CONTRAST  TECHNIQUE: Multidetector CT imaging of the head and cervical spine was performed following the standard protocol without intravenous contrast. Multiplanar CT image reconstructions of the cervical spine were also generated.  COMPARISON:  None.  FINDINGS: CT HEAD FINDINGS  Skull and Sinuses:Negative for fracture or destructive process. The mastoids, middle ears, and imaged paranasal sinuses are clear.  Orbits: No acute abnormality.  Brain: No evidence of acute infarction, hemorrhage, hydrocephalus, or mass lesion/mass effect. Few small low densities in the bilateral centrum semiovale, nonspecific but usually from chronic small vessel disease .  CT CERVICAL SPINE FINDINGS  No evidence of acute fracture or traumatic malalignment. No prevertebral edema or gross cervical canal hematoma.  Status post C5-6 and C7-T1 discectomy with complete bony fusion. Attempted discectomy at C6-7, but the disc space is open and degenerated with left uncovertebral spurring causing moderate foramina stenosis. There is also a notable left paracentral disc protrusion at C4-5, contacting and deforming the cord.  IMPRESSION: 1. No evidence of intracranial  injury or cervical spine fracture. 2. Cervical spine postoperative and degenerative changes noted above, including a C4-5 disc herniation with moderate canal stenosis. Reference outside cervical spine MRI 10/18/2014.   Electronically Signed   By: Monte Fantasia M.D.   On: 11/18/2014 11:05   Ct Cervical Spine Wo Contrast  11/18/2014   CLINICAL DATA:  Fall with dizziness and headache. Neck pain. Initial encounter.  EXAM: CT HEAD WITHOUT CONTRAST  CT CERVICAL SPINE WITHOUT CONTRAST  TECHNIQUE: Multidetector CT imaging of the head and cervical spine was performed following the standard protocol without intravenous contrast. Multiplanar CT image reconstructions of the cervical spine were also generated.  COMPARISON:  None.  FINDINGS: CT HEAD FINDINGS  Skull and Sinuses:Negative for fracture or destructive process. The mastoids, middle ears, and imaged paranasal sinuses are clear.  Orbits: No acute abnormality.  Brain: No evidence of acute infarction, hemorrhage, hydrocephalus, or mass lesion/mass effect. Few small low densities in the bilateral centrum semiovale, nonspecific but usually from chronic small vessel disease .  CT CERVICAL SPINE FINDINGS  No evidence of acute fracture or traumatic malalignment. No prevertebral edema or gross cervical canal hematoma.  Status post C5-6 and C7-T1 discectomy with complete bony fusion. Attempted discectomy at C6-7, but the disc space is open and degenerated with left uncovertebral spurring causing moderate foramina stenosis. There is also a notable left paracentral disc protrusion at C4-5, contacting and deforming the cord.  IMPRESSION: 1. No evidence of intracranial injury or cervical spine fracture. 2. Cervical spine postoperative and degenerative changes noted above, including a C4-5 disc herniation with moderate canal stenosis. Reference outside cervical spine MRI 10/18/2014.   Electronically Signed   By: Monte Fantasia M.D.   On: 11/18/2014 11:05   Mr Brain Wo  Contrast  11/18/2014   CLINICAL DATA:  69 year old male with vertigo, right side weakness, several falls. Headaches. Current history of cervical spinal stenosis, surgical decompression upcoming. Initial encounter.  EXAM: MRI HEAD WITHOUT CONTRAST  MRI CERVICAL SPINE WITHOUT CONTRAST  TECHNIQUE: Multiplanar, multiecho pulse sequences of the brain and surrounding structures, and cervical spine, to include the craniocervical junction and cervicothoracic junction, were obtained without intravenous contrast.  COMPARISON:  Cervical spine CT and head CT 1040 hours today. Outside cervical spine MRI 10/18/2014 available on [facility] PACS  FINDINGS: MRI HEAD FINDINGS  Cerebral volume is within normal limits for age. No restricted diffusion to suggest acute infarction. No midline shift, mass effect, evidence of mass lesion, ventriculomegaly, extra-axial collection or acute intracranial hemorrhage. Cervicomedullary junction and pituitary are within normal limits. Major intracranial vascular flow voids are preserved. Patchy periventricular cerebral white matter T2 and FLAIR hyperintensity in both hemispheres, fairly symmetric. Extent is mild to moderate for  age. No cortical encephalomalacia identified. Deep gray matter nuclei and brainstem are within normal limits. There is a tiny chronic lacunar infarct in the left cerebellum on series 6, image 9. No chronic blood products identified in the brain.  Normal bone marrow signal. Visible internal auditory structures appear normal. Mastoids are clear. Paranasal sinus mucosal thickening most pronounced in the left maxillary sinus. Visualized orbit soft tissues are within normal limits. Visualized scalp soft tissues are within normal limits.  MRI CERVICAL SPINE FINDINGS  Previous fusion of the C5-C6 and C7-T1 levels. Stable vertebral height and alignment since 10/18/2014. No marrow edema or evidence of acute osseous abnormality.  Cervicomedullary junction is within normal limits. There  is abnormal cervical spinal cord signal corresponding to the level of stenosis with cord compression at C4-C5. This signal abnormality appears stable since 10/18/2014. Elsewhere the visible spinal cord signal is within normal limits.  Negative paraspinal soft tissues.  C2-C3:  Stable, no significant stenosis.  C3-C4: Stable circumferential disc osteophyte complex with broad-based posterior component of disc and moderate facet hypertrophy. Mild spinal stenosis without cord mass effect. Moderate to severe C4 foraminal stenosis is greater on the right and appear stable.  C4-C5: Disc space loss with circumferential disc osteophyte complex including broad-based and bulky central component of what appears to be soft disc. Superimposed ligament flavum hypertrophy. Spinal stenosis with cord mass effect and signal abnormality, stable. Uncovertebral and moderate to severe facet hypertrophy greater on the right, with severe C5 foraminal stenosis, also stable.  C5-C6:  Stable status post fusion.  C6-C7: Unfused, with severe disc space loss and left eccentric bulky disc osteophyte complex. Left paracentral component of disc osteophyte complex is stable with narrowing of the ventral CSF space, but no significant spinal stenosis. Mild left C7 foraminal stenosis is stable.  C7-T1:  Stable status post fusion.  T1-T2: Stable or regressed small right paracentral disc protrusion. No spinal stenosis. Mild T1 foraminal stenosis is stable.  IMPRESSION: 1. No acute intracranial abnormality. 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 2. Stable cervical spine compared to the outside MRI on 10/18/2014, demonstrating degenerative spinal stenosis with cord compression and focal abnormal cord signal at C4-C5. This occurs as adjacent segment disease in the setting of prior C5-C6 fusion. 3. Previous C7-T1 fusion as well, with stable adjacent segment disease at  C6-C7 not resulting in spinal stenosis.   Electronically Signed   By: Genevie Ann M.D.   On: 11/18/2014 16:27   Mr Cervical Spine Wo Contrast  11/18/2014   CLINICAL DATA:  69 year old male with vertigo, right side weakness, several falls. Headaches. Current history of cervical spinal stenosis, surgical decompression upcoming. Initial encounter.  EXAM: MRI HEAD WITHOUT CONTRAST  MRI CERVICAL SPINE WITHOUT CONTRAST  TECHNIQUE: Multiplanar, multiecho pulse sequences of the brain and surrounding structures, and cervical spine, to include the craniocervical junction and cervicothoracic junction, were obtained without intravenous contrast.  COMPARISON:  Cervical spine CT and head CT 1040 hours today. Outside cervical spine MRI 10/18/2014 available on [facility] P[facility]  FINDINGS: MRI HEAD FINDINGS  Cerebral volume is within normal limits for age. No restricted diffusion to suggest acute infarction. No midline shift, mass effect, evidence of mass lesion, ventriculomegaly, extra-axial collection or acute intracranial hemorrhage. Cervicomedullary junction and pituitary are within normal limits. Major intracranial vascular flow voids are preserved. Patchy periventricular cerebral white matter T2 and FLAIR hyperintensity in both hemispheres, fairly symmetric. Extent is mild to  moderate for age. No cortical encephalomalacia identified. Deep gray matter nuclei and brainstem are within normal limits. There is a tiny chronic lacunar infarct in the left cerebellum on series 6, image 9. No chronic blood products identified in the brain.  Normal bone marrow signal. Visible internal auditory structures appear normal. Mastoids are clear. Paranasal sinus mucosal thickening most pronounced in the left maxillary sinus. Visualized orbit soft tissues are within normal limits. Visualized scalp soft tissues are within normal limits.  MRI CERVICAL SPINE FINDINGS  Previous fusion of the C5-C6 and C7-T1 levels. Stable vertebral height and alignment  since 10/18/2014. No marrow edema or evidence of acute osseous abnormality.  Cervicomedullary junction is within normal limits. There is abnormal cervical spinal cord signal corresponding to the level of stenosis with cord compression at C4-C5. This signal abnormality appears stable since 10/18/2014. Elsewhere the visible spinal cord signal is within normal limits.  Negative paraspinal soft tissues.  C2-C3:  Stable, no significant stenosis.  C3-C4: Stable circumferential disc osteophyte complex with broad-based posterior component of disc and moderate facet hypertrophy. Mild spinal stenosis without cord mass effect. Moderate to severe C4 foraminal stenosis is greater on the right and appear stable.  C4-C5: Disc space loss with circumferential disc osteophyte complex including broad-based and bulky central component of what appears to be soft disc. Superimposed ligament flavum hypertrophy. Spinal stenosis with cord mass effect and signal abnormality, stable. Uncovertebral and moderate to severe facet hypertrophy greater on the right, with severe C5 foraminal stenosis, also stable.  C5-C6:  Stable status post fusion.  C6-C7: Unfused, with severe disc space loss and left eccentric bulky disc osteophyte complex. Left paracentral component of disc osteophyte complex is stable with narrowing of the ventral CSF space, but no significant spinal stenosis. Mild left C7 foraminal stenosis is stable.  C7-T1:  Stable status post fusion.  T1-T2: Stable or regressed small right paracentral disc protrusion. No spinal stenosis. Mild T1 foraminal stenosis is stable.  IMPRESSION: 1. No acute intracranial abnormality. 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 2. Stable cervical spine compared to the outside MRI on 10/18/2014, demonstrating degenerative spinal stenosis with cord compression and focal abnormal cord signal at C4-C5. This occurs as  adjacent segment disease in the setting of prior C5-C6 fusion. 3. Previous C7-T1 fusion as well, with stable adjacent segment disease at C6-C7 not resulting in spinal stenosis.   Electronically Signed   By: Genevie Ann M.D.   On: 11/18/2014 16:27     EKG Interpretation   Date/Time:  Monday November 18 2014 10:00:12 EDT Ventricular Rate:  69 PR Interval:  162 QRS Duration: 101 QT Interval:  418 QTC Calculation: 448 R Axis:   0 Text Interpretation:  Sinus rhythm Atrial premature complex Probable  lateral infarct, age indeterminate Anteroseptal infarct, age indeterminate  Baseline wander in lead(s) I II aVR V4 No previous ECGs available  Confirmed by Wyvonnia Dusky  MD, Odilon Cass [phone]) on 11/18/2014 10:17:37 AM      MDM   Final diagnoses:  Neck pain  Syncope, unspecified syncope type   Acute on chronic neck pain now worse after fall versus syncope. Right-sided weakness at baseline. Scheduled for surgery tomorrow  Concern for recurrent syncope without prodrome.  CT head and C spine stable.  Discussed with Dr. Ellene Route. He agrees with medical admission for syncope workup and will evaluate patient later today. Still may have surgery tomorrow.  Labs with hyperglycemia, no  DKA. Patient with insulin pump. Given R sided weakness and possible vertigo, will obtain MRI brain to evaluate for possible infarct. D/w [place].  Ezequiel Essex, MD 11/18/14 [phone]

## 2014-11-19 ENCOUNTER — Encounter (HOSPITAL_COMMUNITY): Payer: Self-pay | Admitting: Anesthesiology

## 2014-11-19 ENCOUNTER — Inpatient Hospital Stay (HOSPITAL_COMMUNITY)
Admission: RE | Admit: 2014-11-19 | Payer: Medicare (Managed Care) | Source: Ambulatory Visit | Admitting: Neurological Surgery

## 2014-11-19 ENCOUNTER — Inpatient Hospital Stay (HOSPITAL_COMMUNITY): Payer: Medicare (Managed Care) | Admitting: Vascular Surgery

## 2014-11-19 ENCOUNTER — Encounter (HOSPITAL_COMMUNITY)
Admission: EM | Disposition: A | Discharge status: Home / Self Care | Payer: Self-pay | Source: Home / Self Care | Attending: Emergency Medicine

## 2014-11-19 ENCOUNTER — Inpatient Hospital Stay (HOSPITAL_COMMUNITY): Payer: Medicare (Managed Care)

## 2014-11-19 DIAGNOSIS — M5002 Cervical disc disorder with myelopathy, mid-cervical region: Secondary | ICD-10-CM | POA: Diagnosis not present

## 2014-11-19 DIAGNOSIS — R42 Dizziness and giddiness: Secondary | ICD-10-CM | POA: Diagnosis not present

## 2014-11-19 DIAGNOSIS — M5012 Cervical disc disorder with radiculopathy, mid-cervical region: Secondary | ICD-10-CM | POA: Diagnosis not present

## 2014-11-19 DIAGNOSIS — M4712 Other spondylosis with myelopathy, cervical region: Secondary | ICD-10-CM | POA: Diagnosis present

## 2014-11-19 DIAGNOSIS — Z01810 Encounter for preprocedural cardiovascular examination: Secondary | ICD-10-CM | POA: Diagnosis not present

## 2014-11-19 DIAGNOSIS — I10 Essential (primary) hypertension: Secondary | ICD-10-CM | POA: Diagnosis not present

## 2014-11-19 HISTORY — DX: Malignant (primary) neoplasm, unspecified: C80.1

## 2014-11-19 HISTORY — PX: ANTERIOR CERVICAL DECOMP/DISCECTOMY FUSION: SHX1161

## 2014-11-19 HISTORY — DX: Adverse effect of unspecified anesthetic, initial encounter: T41.45XA

## 2014-11-19 HISTORY — DX: Disease of blood and blood-forming organs, unspecified: D75.9

## 2014-11-19 HISTORY — DX: Acute myocardial infarction, unspecified: I21.9

## 2014-11-19 HISTORY — DX: Chronic kidney disease, unspecified: N18.9

## 2014-11-19 LAB — BASIC METABOLIC PANEL
Anion gap: 9 (ref 5–15)
BUN: 27 mg/dL — ABNORMAL HIGH (ref 6–23)
CALCIUM: 8.5 mg/dL (ref 8.4–10.5)
CO2: 23 mmol/L (ref 19–32)
Chloride: 107 mmol/L (ref 96–112)
Creatinine, Ser: 1.32 mg/dL (ref 0.50–1.35)
GFR calc Af Amer: 63 mL/min — ABNORMAL LOW (ref >90)
GFR, EST NON AFRICAN AMERICAN: 54 mL/min — AB (ref >90)
GLUCOSE: 160 mg/dL — AB (ref 70–99)
Potassium: 4.7 mmol/L (ref 3.5–5.1)
Sodium: 139 mmol/L (ref 135–145)

## 2014-11-19 LAB — URINALYSIS, ROUTINE W REFLEX MICROSCOPIC
Bilirubin Urine: NEGATIVE
Glucose, UA: NEGATIVE mg/dL
Hgb urine dipstick: NEGATIVE
Ketones, ur: NEGATIVE mg/dL
Leukocytes, UA: NEGATIVE
Nitrite: NEGATIVE
Protein, ur: NEGATIVE mg/dL
SPECIFIC GRAVITY, URINE: 1.009 (ref 1.005–1.030)
Urobilinogen, UA: 0.2 mg/dL (ref 0.0–1.0)
pH: 5 (ref 5.0–8.0)

## 2014-11-19 LAB — GLUCOSE, CAPILLARY
GLUCOSE-CAPILLARY: 201 mg/dL — AB (ref 70–99)
GLUCOSE-CAPILLARY: 148 mg/dL — AB (ref 70–99)
Glucose-Capillary: 131 mg/dL — ABNORMAL HIGH (ref 70–99)
Glucose-Capillary: 143 mg/dL — ABNORMAL HIGH (ref 70–99)
Glucose-Capillary: 207 mg/dL — ABNORMAL HIGH (ref 70–99)
Glucose-Capillary: 215 mg/dL — ABNORMAL HIGH (ref 70–99)

## 2014-11-19 SURGERY — ANTERIOR CERVICAL DECOMPRESSION/DISCECTOMY FUSION 1 LEVEL
Anesthesia: General | Site: Neck

## 2014-11-19 MED ORDER — DIAZEPAM 5 MG PO TABS
5.0000 mg | ORAL_TABLET | Freq: Four times a day (QID) | ORAL | Status: DC | PRN
  Administered 2014-11-19: 5 mg via ORAL

## 2014-11-19 MED ORDER — SODIUM CHLORIDE 0.9 % IJ SOLN: 3.0000 mL | INTRAMUSCULAR | Status: DC | PRN

## 2014-11-19 MED ORDER — GLYCOPYRROLATE 0.2 MG/ML IJ SOLN
INTRAMUSCULAR | Status: DC | PRN
  Administered 2014-11-19: .7 mg via INTRAVENOUS

## 2014-11-19 MED ORDER — INSULIN ASPART 100 UNIT/ML ~~LOC~~ SOLN
2.0000 [IU] | Freq: Once | SUBCUTANEOUS | Status: AC
  Administered 2014-11-19: 2 [IU] via SUBCUTANEOUS

## 2014-11-19 MED ORDER — FENTANYL CITRATE (PF) 100 MCG/2ML IJ SOLN
INTRAMUSCULAR | Status: DC | PRN
  Administered 2014-11-19: 100 ug via INTRAVENOUS

## 2014-11-19 MED ORDER — ONDANSETRON HCL 4 MG/2ML IJ SOLN
4.0000 mg | INTRAMUSCULAR | Status: DC | PRN
  Administered 2014-11-19 – 2014-11-20 (×2): 4 mg via INTRAVENOUS
  Filled 2014-11-19 (×2): qty 2

## 2014-11-19 MED ORDER — ALUM & MAG HYDROXIDE-SIMETH 200-200-20 MG/5ML PO SUSP: 30.0000 mL | Freq: Four times a day (QID) | ORAL | Status: DC | PRN

## 2014-11-19 MED ORDER — HYDROCODONE-ACETAMINOPHEN 5-325 MG PO TABS
1.0000 | ORAL_TABLET | Freq: Four times a day (QID) | ORAL | Status: DC | PRN
  Filled 2014-11-19: qty 1

## 2014-11-19 MED ORDER — PHENOL 1.4 % MT LIQD: 1.0000 | OROMUCOSAL | Status: DC | PRN

## 2014-11-19 MED ORDER — BUPIVACAINE HCL (PF) 0.25 % IJ SOLN
INTRAMUSCULAR | Status: DC | PRN
  Administered 2014-11-19: 2.5 mL

## 2014-11-19 MED ORDER — ALBUMIN HUMAN 5 % IV SOLN
INTRAVENOUS | Status: DC | PRN
  Administered 2014-11-19: 17:00:00 via INTRAVENOUS

## 2014-11-19 MED ORDER — THROMBIN 5000 UNITS EX SOLR
CUTANEOUS | Status: DC | PRN
  Administered 2014-11-19 (×2): 5000 [IU] via TOPICAL

## 2014-11-19 MED ORDER — CEFAZOLIN SODIUM 1-5 GM-% IV SOLN
1.0000 g | Freq: Three times a day (TID) | INTRAVENOUS | Status: AC
  Administered 2014-11-19 – 2014-11-20 (×2): 1 g via INTRAVENOUS
  Filled 2014-11-19 (×2): qty 50

## 2014-11-19 MED ORDER — SODIUM CHLORIDE 0.9 % IJ SOLN
3.0000 mL | Freq: Two times a day (BID) | INTRAMUSCULAR | Status: DC
  Administered 2014-11-19 – 2014-11-20 (×2): 3 mL via INTRAVENOUS

## 2014-11-19 MED ORDER — MORPHINE SULFATE 2 MG/ML IJ SOLN: 1.0000 mg | INTRAMUSCULAR | Status: DC | PRN

## 2014-11-19 MED ORDER — HEMOSTATIC AGENTS (NO CHARGE) OPTIME
TOPICAL | Status: DC | PRN
  Administered 2014-11-19: 1 via TOPICAL

## 2014-11-19 MED ORDER — MENTHOL 3 MG MT LOZG: 1.0000 | LOZENGE | OROMUCOSAL | Status: DC | PRN

## 2014-11-19 MED ORDER — MIDAZOLAM HCL 5 MG/5ML IJ SOLN
INTRAMUSCULAR | Status: DC | PRN
  Administered 2014-11-19: 2 mg via INTRAVENOUS

## 2014-11-19 MED ORDER — ACETAMINOPHEN 325 MG PO TABS: 650.0000 mg | ORAL_TABLET | ORAL | Status: DC | PRN

## 2014-11-19 MED ORDER — PROPOFOL 10 MG/ML IV BOLUS
INTRAVENOUS | Status: DC | PRN
  Administered 2014-11-19: 150 mg via INTRAVENOUS

## 2014-11-19 MED ORDER — LIDOCAINE-EPINEPHRINE 1 %-1:100000 IJ SOLN
INTRAMUSCULAR | Status: DC | PRN
  Administered 2014-11-19: 2.5 mL

## 2014-11-19 MED ORDER — DEXAMETHASONE SODIUM PHOSPHATE 10 MG/ML IJ SOLN
INTRAMUSCULAR | Status: DC | PRN
  Administered 2014-11-19: 10 mg via INTRAVENOUS

## 2014-11-19 MED ORDER — OXYCODONE-ACETAMINOPHEN 5-325 MG PO TABS
1.0000 | ORAL_TABLET | ORAL | Status: DC | PRN
  Administered 2014-11-19 – 2014-11-20 (×2): 2 via ORAL
  Filled 2014-11-19 (×2): qty 2

## 2014-11-19 MED ORDER — HYDROMORPHONE HCL 1 MG/ML IJ SOLN
INTRAMUSCULAR | Status: AC
  Administered 2014-11-19: 0.5 mg via INTRAVENOUS
  Filled 2014-11-19: qty 2

## 2014-11-19 MED ORDER — HYDRALAZINE HCL 20 MG/ML IJ SOLN
5.0000 mg | INTRAMUSCULAR | Status: DC | PRN
  Administered 2014-11-20: 5 mg via INTRAVENOUS
  Filled 2014-11-19: qty 1

## 2014-11-19 MED ORDER — HYDROMORPHONE HCL 1 MG/ML IJ SOLN
0.2500 mg | INTRAMUSCULAR | Status: DC | PRN
  Administered 2014-11-19 (×4): 0.5 mg via INTRAVENOUS

## 2014-11-19 MED ORDER — SODIUM CHLORIDE 0.9 % IV SOLN
250.0000 mL | INTRAVENOUS | Status: DC
  Administered 2014-11-19: 250 mL via INTRAVENOUS

## 2014-11-19 MED ORDER — CEFAZOLIN SODIUM-DEXTROSE 2-3 GM-% IV SOLR
2.0000 g | INTRAVENOUS | Status: AC
  Administered 2014-11-19: 2 g via INTRAVENOUS
  Filled 2014-11-19: qty 50

## 2014-11-19 MED ORDER — ACETAMINOPHEN 650 MG RE SUPP: 650.0000 mg | RECTAL | Status: DC | PRN

## 2014-11-19 MED ORDER — ROCURONIUM BROMIDE 100 MG/10ML IV SOLN
INTRAVENOUS | Status: DC | PRN
  Administered 2014-11-19: 40 mg via INTRAVENOUS

## 2014-11-19 MED ORDER — NEOSTIGMINE METHYLSULFATE 10 MG/10ML IV SOLN
INTRAVENOUS | Status: DC | PRN
  Administered 2014-11-19: 4 mg via INTRAVENOUS

## 2014-11-19 MED ORDER — ONDANSETRON HCL 4 MG/2ML IJ SOLN
INTRAMUSCULAR | Status: DC | PRN
  Administered 2014-11-19: 4 mg via INTRAVENOUS

## 2014-11-19 MED ORDER — LIDOCAINE HCL (CARDIAC) 20 MG/ML IV SOLN
INTRAVENOUS | Status: DC | PRN
  Administered 2014-11-19: 50 mg via INTRAVENOUS

## 2014-11-19 MED ORDER — SODIUM CHLORIDE 0.9 % IR SOLN
Status: DC | PRN
  Administered 2014-11-19: 17:00:00

## 2014-11-19 MED ORDER — INSULIN GLARGINE 100 UNIT/ML ~~LOC~~ SOLN
6.0000 [IU] | Freq: Every day | SUBCUTANEOUS | Status: DC
  Administered 2014-11-19 – 2014-11-20 (×2): 6 [IU] via SUBCUTANEOUS
  Filled 2014-11-19 (×2): qty 0.06

## 2014-11-19 MED ORDER — HYDROCODONE-ACETAMINOPHEN 5-325 MG PO TABS: 1.0000 | ORAL_TABLET | ORAL | Status: DC | PRN

## 2014-11-19 MED ORDER — LACTATED RINGERS IV SOLN
INTRAVENOUS | Status: DC | PRN
  Administered 2014-11-19 (×2): via INTRAVENOUS

## 2014-11-19 MED ORDER — 0.9 % SODIUM CHLORIDE (POUR BTL) OPTIME
TOPICAL | Status: DC | PRN
  Administered 2014-11-19: 1000 mL

## 2014-11-19 SURGICAL SUPPLY — 61 items
ADH SKN CLS LQ APL DERMABOND (GAUZE/BANDAGES/DRESSINGS) ×1
ALLOGRAFT LORDOTIC CC 7X11X14 (Bone Implant) ×1 IMPLANT
BAG DECANTER FOR FLEXI CONT (MISCELLANEOUS) ×2 IMPLANT
BIT DRILL NEURO 2X3.1 SFT TUCH (MISCELLANEOUS) ×1 IMPLANT
BNDG GAUZE ELAST 4 BULKY (GAUZE/BANDAGES/DRESSINGS) IMPLANT
BUR BARREL STRAIGHT FLUTE 4.0 (BURR) ×2 IMPLANT
CANISTER SUCT 3000ML PPV (MISCELLANEOUS) ×2 IMPLANT
CONT SPEC 4OZ CLIKSEAL STRL BL (MISCELLANEOUS) ×4 IMPLANT
DECANTER SPIKE VIAL GLASS SM (MISCELLANEOUS) ×2 IMPLANT
DERMABOND ADHESIVE PROPEN (GAUZE/BANDAGES/DRESSINGS) ×1
DERMABOND ADVANCED .7 DNX6 (GAUZE/BANDAGES/DRESSINGS) IMPLANT
DRAPE LAPAROTOMY 100X72 PEDS (DRAPES) ×2 IMPLANT
DRAPE MICROSCOPE LEICA (MISCELLANEOUS) IMPLANT
DRAPE POUCH INSTRU U-SHP 10X18 (DRAPES) ×2 IMPLANT
DRILL NEURO 2X3.1 SOFT TOUCH (MISCELLANEOUS) ×2
DRSG OPSITE 4X5.5 SM (GAUZE/BANDAGES/DRESSINGS) ×1 IMPLANT
DRSG TELFA 3X8 NADH (GAUZE/BANDAGES/DRESSINGS) IMPLANT
DURAPREP 6ML APPLICATOR 50/CS (WOUND CARE) ×2 IMPLANT
ELECT REM PT RETURN 9FT ADLT (ELECTROSURGICAL) ×2
ELECTRODE REM PT RTRN 9FT ADLT (ELECTROSURGICAL) ×1 IMPLANT
GAUZE SPONGE 4X4 16PLY XRAY LF (GAUZE/BANDAGES/DRESSINGS) IMPLANT
GLOVE BIO SURGEON STRL SZ 6.5 (GLOVE) ×2 IMPLANT
GLOVE BIO SURGEON STRL SZ7.5 (GLOVE) IMPLANT
GLOVE BIOGEL PI IND STRL 6.5 (GLOVE) IMPLANT
GLOVE BIOGEL PI IND STRL 7.5 (GLOVE) IMPLANT
GLOVE BIOGEL PI IND STRL 8.5 (GLOVE) ×1 IMPLANT
GLOVE BIOGEL PI INDICATOR 6.5 (GLOVE) ×1
GLOVE BIOGEL PI INDICATOR 7.5 (GLOVE) ×1
GLOVE BIOGEL PI INDICATOR 8.5 (GLOVE) ×1
GLOVE ECLIPSE 7.5 STRL STRAW (GLOVE) ×1 IMPLANT
GLOVE ECLIPSE 8.5 STRL (GLOVE) ×2 IMPLANT
GLOVE EXAM NITRILE LRG STRL (GLOVE) IMPLANT
GLOVE EXAM NITRILE MD LF STRL (GLOVE) IMPLANT
GLOVE EXAM NITRILE XL STR (GLOVE) IMPLANT
GLOVE EXAM NITRILE XS STR PU (GLOVE) IMPLANT
GOWN STRL REUS W/ TWL LRG LVL3 (GOWN DISPOSABLE) IMPLANT
GOWN STRL REUS W/ TWL XL LVL3 (GOWN DISPOSABLE) ×1 IMPLANT
GOWN STRL REUS W/TWL 2XL LVL3 (GOWN DISPOSABLE) ×2 IMPLANT
GOWN STRL REUS W/TWL LRG LVL3 (GOWN DISPOSABLE) ×4
GOWN STRL REUS W/TWL XL LVL3 (GOWN DISPOSABLE)
HALTER HD/CHIN CERV TRACTION D (MISCELLANEOUS) ×2 IMPLANT
KIT BASIN OR (CUSTOM PROCEDURE TRAY) ×2 IMPLANT
KIT ROOM TURNOVER OR (KITS) ×2 IMPLANT
LIQUID BAND (GAUZE/BANDAGES/DRESSINGS) ×1 IMPLANT
NDL SPNL 22GX3.5 QUINCKE BK (NEEDLE) ×1 IMPLANT
NEEDLE HYPO 22GX1.5 SAFETY (NEEDLE) ×2 IMPLANT
NEEDLE SPNL 22GX3.5 QUINCKE BK (NEEDLE) ×2 IMPLANT
NS IRRIG 1000ML POUR BTL (IV SOLUTION) ×2 IMPLANT
PACK LAMINECTOMY NEURO (CUSTOM PROCEDURE TRAY) ×2 IMPLANT
PAD ARMBOARD 7.5X6 YLW CONV (MISCELLANEOUS) ×6 IMPLANT
PATTIES SURGICAL .5 X.5 (GAUZE/BANDAGES/DRESSINGS) ×2 IMPLANT
PLATE ARCHON 1-LEVEL 22MM (Plate) ×1 IMPLANT
RUBBERBAND STERILE (MISCELLANEOUS) IMPLANT
SCREW ARCHON SELFTAP 4.0X13 (Screw) ×8 IMPLANT
SPONGE INTESTINAL PEANUT (DISPOSABLE) ×2 IMPLANT
SPONGE SURGIFOAM ABS GEL SZ50 (HEMOSTASIS) ×2 IMPLANT
SUT VIC AB 3-0 SH 8-18 (SUTURE) ×2 IMPLANT
SYR 20ML ECCENTRIC (SYRINGE) ×2 IMPLANT
TOWEL OR 17X24 6PK STRL BLUE (TOWEL DISPOSABLE) ×2 IMPLANT
TOWEL OR 17X26 10 PK STRL BLUE (TOWEL DISPOSABLE) ×2 IMPLANT
WATER STERILE IRR 1000ML POUR (IV SOLUTION) ×2 IMPLANT

## 2014-11-19 NOTE — Progress Notes (Signed)
Patient Demographics  John Patton, is a 69 y.o. male, DOB - [birth year], WUJ:[id number]  Admit date - 11/18/2014   Admitting Physician Theodis Blaze, MD  Outpatient Primary MD for the patient is Default, Provider, MD  LOS - 1   Chief Complaint  Patient presents with  . Neck Pain        Subjective:   John Patton today has, No headache, No chest pain, No abdominal pain - No Nausea, No new weakness tingling or numbness, No Cough - SOB.   Assessment & Plan    Principal Problem:   Vertigo Active Problems:   Elevated serum creatinine   Cervical stenosis of spine   Hx of CABG   HTN (hypertension)   IDDM (insulin dependent diabetes mellitus)   Insulin pump in place  EpisodicVertigo  - based on the symptoms appears to be the peripheral vertigo  - agree with MRI brain showing chronic tiny lacunar infarct., -  2 D ECHO showing EF 55-60%, with grade 1 diastolic dysfunction, -  PT evaluation   Cervical stenosis of spine -Dr. Clarice Pole consult appreciated , giving patient started to develop symptoms, he will call for anterior cervical decompression today.   Acute renal failure - unclear if pt has baseline CKD - Improving on gentle hydration - hold ARB  HTN (hypertension) - blood pressure labile - Hydralazine as needed, continue metoprolol - Resume ARB patient is more stable   IDDM (insulin dependent diabetes mellitus) - consulted diabetes coordinator  - continue to hold insulin pump patient is nothing by mouth, and going for surgery today, will keep on insulin sliding scale after surgery, will avoid long acting insulin, resume back on insulin pump in a.m.Marland Kitchen   CAD with CABG in 04/2014 - Started daily aspirin. Continue pravastatin  Code Status: Full  Family Communication: None at bedside  Disposition Plan: Remains inpatient   Procedures    None, plans for anterior cervical decompression   Consults   Neurosurgery   Medications  Scheduled Meds: . [MAR Hold] aspirin  81 mg Oral Daily  . [MAR Hold] docusate sodium  100 mg Oral BID  . [MAR Hold] gabapentin  300 mg Oral Daily  . [MAR Hold] heparin  5,000 Units Subcutaneous 3 times per day  . [MAR Hold] insulin aspart  0-9 Units Subcutaneous TID WC  . [MAR Hold] insulin glargine  6 Units Subcutaneous Daily  . [MAR Hold] meclizine  12.5 mg Oral BID  . [MAR Hold] metoprolol tartrate  25 mg Oral BID  . [MAR Hold] pantoprazole  40 mg Oral QAC breakfast  . [MAR Hold] pravastatin  40 mg Oral q1800  . [MAR Hold] sodium chloride  3 mL Intravenous Q12H  . [MAR Hold] tamsulosin  0.4 mg Oral QPC supper   Continuous Infusions: . sodium chloride 1,000 mL (11/19/14 1057)   PRN Meds:.[MAR Hold] acetaminophen **OR** [MAR Hold] acetaminophen, [MAR Hold] alum & mag hydroxide-simeth, [MAR Hold] HYDROcodone-acetaminophen, [MAR Hold] ondansetron **OR** [MAR Hold] ondansetron (ZOFRAN) IV  DVT ProphylaxisHeparin -   Lab Results  Component Value Date   PLT 195 11/18/2014    Antibiotics   Anti-infectives    Start     Dose/Rate Route Frequency Ordered Stop   11/19/14 1709  bacitracin  50,000 Units in sodium chloride irrigation 0.9 % 500 mL irrigation  Status:  Discontinued       As needed 11/19/14 1709 11/19/14 1750   11/19/14 1400  ceFAZolin (ANCEF) IVPB 2 g/50 mL premix     2 g 100 mL/hr over 30 Minutes Intravenous On call to O.R. 11/19/14 1242 11/19/14 1632          Objective:   Filed Vitals:   11/18/14 2310 11/19/14 0219 11/19/14 0555 11/19/14 0957  BP: 116/84 130/71 153/93 182/92  Pulse:  63 62 70  Temp:  97.9 F (36.6 C) 97.8 F (36.6 C)   TempSrc:  Oral Oral   Resp:  17 18 18   Height:      Weight:   81.557 kg (179 lb 12.8 oz)   SpO2:  96% 96% 99%    Wt Readings from Last 3 Encounters:  11/19/14 81.557 kg (179 lb 12.8 oz)     Intake/Output Summary (Last 24  hours) at 11/19/14 1751 Last data filed at 11/19/14 1736  Gross per 24 hour  Intake   2635 ml  Output   1025 ml  Net   1610 ml     Physical Exam  Awake Alert, Oriented X 3, No new F.N deficits, Normal affect [place].AT,PERRAL Supple Neck,No JVD, No cervical lymphadenopathy appriciated.  Symmetrical Chest wall movement, Good air movement bilaterally, CTAB RRR,No Gallops,Rubs or new Murmurs, No Parasternal Heave +ve B.Sounds, Abd Soft, No tenderness, No organomegaly appriciated, No rebound - guarding or rigidity. No Cyanosis, Clubbing or edema, No new Rash or bruise     Data Review   Micro Results Recent Results (from the past 240 hour(s))  Surgical pcr screen     Status: None   Collection Time: 11/18/14  6:02 PM  Result Value Ref Range Status   MRSA, PCR NEGATIVE NEGATIVE Final   Staphylococcus aureus NEGATIVE NEGATIVE Final    Comment:        The Xpert SA Assay (FDA approved for NASAL specimens in patients over 71 years of age), is one component of a comprehensive surveillance program.  Test performance has been validated by [facility] for patients greater than or equal to 76 year old. It is not intended to diagnose infection nor to guide or monitor treatment.     Radiology Reports Dg Chest 2 View  11/18/2014   CLINICAL DATA:  Preoperative examination. Patient for cervical surgery tomorrow.  EXAM: CHEST  2 VIEW  COMPARISON:  None.  FINDINGS: The patient is status post CABG. The lungs are clear. Heart size is normal. No pneumothorax or pleural effusion. Remote right seventh rib fracture is noted.  IMPRESSION: No acute disease.   Electronically Signed   By: Inge Rise M.D.   On: 11/18/2014 11:20   Ct Head Wo Contrast  11/18/2014   CLINICAL DATA:  Fall with dizziness and headache. Neck pain. Initial encounter.  EXAM: CT HEAD WITHOUT CONTRAST  CT CERVICAL SPINE WITHOUT CONTRAST  TECHNIQUE: Multidetector CT imaging of the head and cervical spine was performed following  the standard protocol without intravenous contrast. Multiplanar CT image reconstructions of the cervical spine were also generated.  COMPARISON:  None.  FINDINGS: CT HEAD FINDINGS  Skull and Sinuses:Negative for fracture or destructive process. The mastoids, middle ears, and imaged paranasal sinuses are clear.  Orbits: No acute abnormality.  Brain: No evidence of acute infarction, hemorrhage, hydrocephalus, or mass lesion/mass effect. Few small low densities in the bilateral centrum semiovale, nonspecific but usually from  chronic small vessel disease .  CT CERVICAL SPINE FINDINGS  No evidence of acute fracture or traumatic malalignment. No prevertebral edema or gross cervical canal hematoma.  Status post C5-6 and C7-T1 discectomy with complete bony fusion. Attempted discectomy at C6-7, but the disc space is open and degenerated with left uncovertebral spurring causing moderate foramina stenosis. There is also a notable left paracentral disc protrusion at C4-5, contacting and deforming the cord.  IMPRESSION: 1. No evidence of intracranial injury or cervical spine fracture. 2. Cervical spine postoperative and degenerative changes noted above, including a C4-5 disc herniation with moderate canal stenosis. Reference outside cervical spine MRI 10/18/2014.   Electronically Signed   By: Monte Fantasia M.D.   On: 11/18/2014 11:05   Ct Cervical Spine Wo Contrast  11/18/2014   CLINICAL DATA:  Fall with dizziness and headache. Neck pain. Initial encounter.  EXAM: CT HEAD WITHOUT CONTRAST  CT CERVICAL SPINE WITHOUT CONTRAST  TECHNIQUE: Multidetector CT imaging of the head and cervical spine was performed following the standard protocol without intravenous contrast. Multiplanar CT image reconstructions of the cervical spine were also generated.  COMPARISON:  None.  FINDINGS: CT HEAD FINDINGS  Skull and Sinuses:Negative for fracture or destructive process. The mastoids, middle ears, and imaged paranasal sinuses are clear.   Orbits: No acute abnormality.  Brain: No evidence of acute infarction, hemorrhage, hydrocephalus, or mass lesion/mass effect. Few small low densities in the bilateral centrum semiovale, nonspecific but usually from chronic small vessel disease .  CT CERVICAL SPINE FINDINGS  No evidence of acute fracture or traumatic malalignment. No prevertebral edema or gross cervical canal hematoma.  Status post C5-6 and C7-T1 discectomy with complete bony fusion. Attempted discectomy at C6-7, but the disc space is open and degenerated with left uncovertebral spurring causing moderate foramina stenosis. There is also a notable left paracentral disc protrusion at C4-5, contacting and deforming the cord.  IMPRESSION: 1. No evidence of intracranial injury or cervical spine fracture. 2. Cervical spine postoperative and degenerative changes noted above, including a C4-5 disc herniation with moderate canal stenosis. Reference outside cervical spine MRI 10/18/2014.   Electronically Signed   By: Monte Fantasia M.D.   On: 11/18/2014 11:05   Mr Brain Wo Contrast  11/18/2014   CLINICAL DATA:  69 year old male with vertigo, right side weakness, several falls. Headaches. Current history of cervical spinal stenosis, surgical decompression upcoming. Initial encounter.  EXAM: MRI HEAD WITHOUT CONTRAST  MRI CERVICAL SPINE WITHOUT CONTRAST  TECHNIQUE: Multiplanar, multiecho pulse sequences of the brain and surrounding structures, and cervical spine, to include the craniocervical junction and cervicothoracic junction, were obtained without intravenous contrast.  COMPARISON:  Cervical spine CT and head CT 1040 hours today. Outside cervical spine MRI 10/18/2014 available on [facility] PACS  FINDINGS: MRI HEAD FINDINGS  Cerebral volume is within normal limits for age. No restricted diffusion to suggest acute infarction. No midline shift, mass effect, evidence of mass lesion, ventriculomegaly, extra-axial collection or acute intracranial hemorrhage.  Cervicomedullary junction and pituitary are within normal limits. Major intracranial vascular flow voids are preserved. Patchy periventricular cerebral white matter T2 and FLAIR hyperintensity in both hemispheres, fairly symmetric. Extent is mild to moderate for age. No cortical encephalomalacia identified. Deep gray matter nuclei and brainstem are within normal limits. There is a tiny chronic lacunar infarct in the left cerebellum on series 6, image 9. No chronic blood products identified in the brain.  Normal bone marrow signal. Visible internal auditory structures appear normal. Mastoids are clear. Paranasal  sinus mucosal thickening most pronounced in the left maxillary sinus. Visualized orbit soft tissues are within normal limits. Visualized scalp soft tissues are within normal limits.  MRI CERVICAL SPINE FINDINGS  Previous fusion of the C5-C6 and C7-T1 levels. Stable vertebral height and alignment since 10/18/2014. No marrow edema or evidence of acute osseous abnormality.  Cervicomedullary junction is within normal limits. There is abnormal cervical spinal cord signal corresponding to the level of stenosis with cord compression at C4-C5. This signal abnormality appears stable since 10/18/2014. Elsewhere the visible spinal cord signal is within normal limits.  Negative paraspinal soft tissues.  C2-C3:  Stable, no significant stenosis.  C3-C4: Stable circumferential disc osteophyte complex with broad-based posterior component of disc and moderate facet hypertrophy. Mild spinal stenosis without cord mass effect. Moderate to severe C4 foraminal stenosis is greater on the right and appear stable.  C4-C5: Disc space loss with circumferential disc osteophyte complex including broad-based and bulky central component of what appears to be soft disc. Superimposed ligament flavum hypertrophy. Spinal stenosis with cord mass effect and signal abnormality, stable. Uncovertebral and moderate to severe facet hypertrophy  greater on the right, with severe C5 foraminal stenosis, also stable.  C5-C6:  Stable status post fusion.  C6-C7: Unfused, with severe disc space loss and left eccentric bulky disc osteophyte complex. Left paracentral component of disc osteophyte complex is stable with narrowing of the ventral CSF space, but no significant spinal stenosis. Mild left C7 foraminal stenosis is stable.  C7-T1:  Stable status post fusion.  T1-T2: Stable or regressed small right paracentral disc protrusion. No spinal stenosis. Mild T1 foraminal stenosis is stable.  IMPRESSION: 1. No acute intracranial abnormality. 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 2. Stable cervical spine compared to the outside MRI on 10/18/2014, demonstrating degenerative spinal stenosis with cord compression and focal abnormal cord signal at C4-C5. This occurs as adjacent segment disease in the setting of prior C5-C6 fusion. 3. Previous C7-T1 fusion as well, with stable adjacent segment disease at C6-C7 not resulting in spinal stenosis.   Electronically Signed   By: Genevie Ann M.D.   On: 11/18/2014 16:27   Mr Cervical Spine Wo Contrast  11/18/2014   CLINICAL DATA:  69 year old male with vertigo, right side weakness, several falls. Headaches. Current history of cervical spinal stenosis, surgical decompression upcoming. Initial encounter.  EXAM: MRI HEAD WITHOUT CONTRAST  MRI CERVICAL SPINE WITHOUT CONTRAST  TECHNIQUE: Multiplanar, multiecho pulse sequences of the brain and surrounding structures, and cervical spine, to include the craniocervical junction and cervicothoracic junction, were obtained without intravenous contrast.  COMPARISON:  Cervical spine CT and head CT 1040 hours today. Outside cervical spine MRI 10/18/2014 available on [facility] PACS  FINDINGS: MRI HEAD FINDINGS  Cerebral volume is within normal limits for age. No restricted diffusion to suggest acute  infarction. No midline shift, mass effect, evidence of mass lesion, ventriculomegaly, extra-axial collection or acute intracranial hemorrhage. Cervicomedullary junction and pituitary are within normal limits. Major intracranial vascular flow voids are preserved. Patchy periventricular cerebral white matter T2 and FLAIR hyperintensity in both hemispheres, fairly symmetric. Extent is mild to moderate for age. No cortical encephalomalacia identified. Deep gray matter nuclei and brainstem are within normal limits. There is a tiny chronic lacunar infarct in the left cerebellum on series 6, image 9. No chronic blood products identified in the brain.  Normal bone marrow signal. Visible internal auditory structures appear normal. Mastoids are  clear. Paranasal sinus mucosal thickening most pronounced in the left maxillary sinus. Visualized orbit soft tissues are within normal limits. Visualized scalp soft tissues are within normal limits.  MRI CERVICAL SPINE FINDINGS  Previous fusion of the C5-C6 and C7-T1 levels. Stable vertebral height and alignment since 10/18/2014. No marrow edema or evidence of acute osseous abnormality.  Cervicomedullary junction is within normal limits. There is abnormal cervical spinal cord signal corresponding to the level of stenosis with cord compression at C4-C5. This signal abnormality appears stable since 10/18/2014. Elsewhere the visible spinal cord signal is within normal limits.  Negative paraspinal soft tissues.  C2-C3:  Stable, no significant stenosis.  C3-C4: Stable circumferential disc osteophyte complex with broad-based posterior component of disc and moderate facet hypertrophy. Mild spinal stenosis without cord mass effect. Moderate to severe C4 foraminal stenosis is greater on the right and appear stable.  C4-C5: Disc space loss with circumferential disc osteophyte complex including broad-based and bulky central component of what appears to be soft disc. Superimposed ligament flavum  hypertrophy. Spinal stenosis with cord mass effect and signal abnormality, stable. Uncovertebral and moderate to severe facet hypertrophy greater on the right, with severe C5 foraminal stenosis, also stable.  C5-C6:  Stable status post fusion.  C6-C7: Unfused, with severe disc space loss and left eccentric bulky disc osteophyte complex. Left paracentral component of disc osteophyte complex is stable with narrowing of the ventral CSF space, but no significant spinal stenosis. Mild left C7 foraminal stenosis is stable.  C7-T1:  Stable status post fusion.  T1-T2: Stable or regressed small right paracentral disc protrusion. No spinal stenosis. Mild T1 foraminal stenosis is stable.  IMPRESSION: 1. No acute intracranial abnormality. 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 2. Stable cervical spine compared to the outside MRI on 10/18/2014, demonstrating degenerative spinal stenosis with cord compression and focal abnormal cord signal at C4-C5. This occurs as adjacent segment disease in the setting of prior C5-C6 fusion. 3. Previous C7-T1 fusion as well, with stable adjacent segment disease at C6-C7 not resulting in spinal stenosis.   Electronically Signed   By: Genevie Ann M.D.   On: 11/18/2014 16:27    CBC  Recent Labs Lab 11/18/14 1026  WBC 5.4  HGB 14.0  HCT 40.0  PLT 195  MCV 94.1  MCH 32.9  MCHC 35.0  RDW 13.0  LYMPHSABS 1.3  MONOABS 0.7  EOSABS 0.1  BASOSABS 0.0    Chemistries   Recent Labs Lab 11/18/14 1026 11/19/14 0542  NA 136 139  K 4.2 4.7  CL 107 107  CO2 22 23  GLUCOSE 408* 160*  BUN 32* 27*  CREATININE 1.41* 1.32  CALCIUM 8.5 8.5  AST 21  --   ALT 19  --   ALKPHOS 64  --   BILITOT 0.4  --    ------------------------------------------------------------------------------------------------------------------ estimated creatinine clearance is 56.1 mL/min (by C-G formula based on Cr of  1.32). ------------------------------------------------------------------------------------------------------------------ No results for input(s): HGBA1C in the last 72 hours. ------------------------------------------------------------------------------------------------------------------ No results for input(s): CHOL, HDL, LDLCALC, TRIG, CHOLHDL, LDLDIRECT in the last 72 hours. ------------------------------------------------------------------------------------------------------------------ No results for input(s): TSH, T4TOTAL, T3FREE, THYROIDAB in the last 72 hours.  Invalid input(s): FREET3 ------------------------------------------------------------------------------------------------------------------ No results for input(s): VITAMINB12, FOLATE, FERRITIN, TIBC, IRON, RETICCTPCT in the last 72 hours.  Coagulation profile No results for input(s): INR, PROTIME in the last 168 hours.  No results for input(s): DDIMER in the last 72 hours.  Cardiac Enzymes  Recent Labs Lab 11/18/14 1026  TROPONINI <0.03   ------------------------------------------------------------------------------------------------------------------ Invalid input(s): POCBNP     Time Spent in minutes   30 minutes   Damaria Vachon M.D on 11/19/2014 at 5:51 PM  Between 7am to 7pm - Pager - [phone]  After 7pm go to [email or web address] - password [email or web address]  And look for the night coverage person covering for me after hours  [facility] Office  [phone]   **Disclaimer: This note may have been dictated with voice recognition software. Similar sounding words can inadvertently be transcribed and this note may contain transcription errors which may not have been corrected upon publication of note.**

## 2014-11-19 NOTE — Progress Notes (Signed)
Spoke with Dr. Sheran Fava this AM, requesting insulin pump settings for basal insulin rate. MD states she will call into pt room, New order for lantus this AM. Day shift RN notified.

## 2014-11-19 NOTE — Anesthesia Preprocedure Evaluation (Addendum)
Anesthesia Evaluation  Patient identified by MRN, date of birth, ID band Patient awake    Reviewed: Allergy & Precautions, NPO status , Patient's Chart, lab work & pertinent test results  Airway Mallampati: II  TM Distance: >3 FB Neck ROM: Full    Dental   Pulmonary  breath sounds clear to auscultation        Cardiovascular hypertension, + Past MI Rhythm:Regular Rate:Normal     Neuro/Psych    GI/Hepatic negative GI ROS, Neg liver ROS,   Endo/Other  diabetes  Renal/GU Renal disease     Musculoskeletal   Abdominal   Peds  Hematology   Anesthesia Other Findings   Reproductive/Obstetrics                            Anesthesia Physical Anesthesia Plan  ASA: III  Anesthesia Plan: General   Post-op Pain Management:    Induction: Intravenous  Airway Management Planned: Oral ETT  Additional Equipment:   Intra-op Plan:   Post-operative Plan: Extubation in OR  Informed Consent: I have reviewed the patients History and Physical, chart, labs and discussed the procedure including the risks, benefits and alternatives for the proposed anesthesia with the patient or authorized representative who has indicated his/her understanding and acceptance.   Dental advisory given  Plan Discussed with: CRNA and Anesthesiologist  Anesthesia Plan Comments:         Anesthesia Quick Evaluation

## 2014-11-19 NOTE — Progress Notes (Signed)
Called vascular lab regarding orders for pt to have ECHO done before pt leaves for surgery. Tiffany down in Vascular Lab stated she will let the tech know to come and do his echo ASAP.

## 2014-11-19 NOTE — Progress Notes (Signed)
  Echocardiogram 2D Echocardiogram has been performed.  John Patton 11/19/2014, 10:26 AM

## 2014-11-19 NOTE — Progress Notes (Addendum)
Dr. [facility]ron [facility] called regarding pt CBG result of 207 and being NPO for surgery at 1330. Dr. [facility] ordered 2 units insulin subQ once to be given now. Will continue to monitor pt.       Maurene Capes RN

## 2014-11-19 NOTE — Progress Notes (Signed)
Inpatient Diabetes Program Recommendations  AACE/ADA: New Consensus Statement on Inpatient Glycemic Control (2013)  Target Ranges:  Prepandial:   less than 140 mg/dL      Peak postprandial:   less than 180 mg/dL (1-2 hours)      Critically ill patients:  140 - 180 mg/dL   Inpatient Diabetes Program Recommendations Insulin - IV drip/GlucoStabilizer: Please consider using the IV insulin drip to normalize patient's glucose. Please start the drip and then have patient removie the pump insertion set.John Patton Pt may need to remain on the drip thrugh his surgery. Insulin - Basal: Inspected insulin pump with patient and reviewed pump setting. Basal insulin totals 22.7 units/24 hrs. Correction (SSI): Per pump: pt takes bolus 1 unit per 50 mg/dL greater than target range of 100-120  (sensitive correction is much the same as the bolus correction setting per pump) Insulin - Meal Coverage: Per Pump: pt takes 1 unit for every 15 grams CHO - (Meal coverage for 60 gram cho would total 4 units meal coverage)   Pt states the pump was taken off and stopped last HS. Pt's pump settings appear effective as he gave 11 units correction in the ED yesterday with glucose of 478 - next cbg within 6 hrs was 78 mg/dL. No meal coverage for supper last night, next cbg at HS was 350 mg/dL-pt gave 6 units correction per pump-fasting this am was 131 mg/dL. Pump then removed and correction ordered for q 4 hrs. Pt would benefit with correction for glucose of 201mg /dL this am. RN states she gave lantus 6 units but no correction. Please consider increase in lantus to 15 units  Pt needs the correction insulin to start asap. Will talk with Dr Sheran Fava if she requests my assistance.  Lantus ordered for 6 units-may need increase to 10-15 units (total basal per pump is 22.7  Units) Sensitive correction appears to be adequate. Once eating, would recommend using 3-4 units meal coverage tidwc and HS scale or resume insulin pump. Surgery scheduled for 2:30  this afternoon. Will follow.  Thank you Rosita Kea, RN, MSN, CDE  Diabetes Inpatient Program Office: [phone] Pager: [phone] 8:00 am to 5:00 pm

## 2014-11-19 NOTE — Op Note (Signed)
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 Anesthesia: Gen. 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 Recently he's had recurrent onset of weakness in his arms on his legs with substantial neck pain and chronic headache this is not thin getting better despite substantial passage of time an MRI demonstrates presence of a large central disc herniation with cord compression. Been advised regarding the need for surgery.  Procedure: Patient was brought to the operating room placed on the table in supine position. After the smooth induction of general endotracheal anesthesia he was placed in 5 pounds of halter traction and the neck was prepped with alcohol and DuraPrep and draped in a sterile fashion. Transverse incision was made in the neck and carried down to the platysma the plane between the sternocleidomastoid and strap muscles was then divided bluntly until the prevertebral space was reached. First identifiable disc space noted was that of C4-C5 localized on the radiograph. Then by dissecting under the longus coli muscle on either side large ventral osteophyte was removed from C4-C5 and this allowed access to the disc space. The disc space had substantial quantities of severely degenerated and desiccated disc material that was removed. The endplates were clean. The posterior longitudinal ligament was identified. This was then dissected and significant quantities of free disc material were found behind the ligament  causing cord compression. This was elevated to a thin veil of ligamentous material was left but this was now elevated back up against the vertebral body. The ligament was not completely opened. The dissection was carried out to the left and to the right side where there is substantial osteophytes that were removed. Once the decompression was completed the interspace was sized for appropriate size graft and was felt that a 7 mm lordotic graft fit best. This was inserted and traction was removed. A 22 mm invasive plate was then fitted to the ventral aspect of the vertebral bodies and secured with variable angle 13 mm screws. A final radiograph was obtained which demonstrated good fixation. Tone was irrigated copiously with antibiotic irrigating solution hemostasis was doubly checked then the platysma was closed with 3-0 Vicryl and 3-0 Vicryl is used to close the subcuticular skin. Blood loss for the procedure was estimated at less than 20 mL.

## 2014-11-19 NOTE — Evaluation (Signed)
Physical Therapy Evaluation Patient Details Name: John Patton MRN: [id number] DOB: [birth year] Today's Date: 11/19/2014   History of Present Illness  Patient is a 70 year old male currently scheduled for C4-5 ACDF tomorrow by Dr. Ellene Route. He seen in the ED this morning vertigo/pre-syncope and is being admitted by [facility] for further evaluation and pre-operative recommendations. Echo is pending, but current H&P from [facility], PA-C (awaiting co-sign) felt that given his recent CABG, he is a moderate perioperative risk for MI.   Clinical Impression  Pt admitted with above diagnosis. Pt currently with functional limitations due to the deficits listed below (see PT Problem List). Pt ambulated well overall.   Much of the vertigo assessment requires neck movement.  Limited vestibular eval performed due to this.  Modified Hallpike produced no symptoms and appears negative however unable to turn neck as much as needed.  Suspect BPPV given pts report of symptoms but just don't feel comfortable testing pt further.  Informed pt that this PT would talk to MD about getting medicine to treat if he has symptoms while recovering from ACDF and then he can go to [facility] for treatment once recovered.  Pt could also have Migraine related dizziness but again, can't treat until this surgery is complete due to contraindication of neck movement at this time.  Will need order after surgery to continue with specific precautions.    Pt will benefit from skilled PT to increase their independence and safety with mobility to allow discharge to the venue listed below.     Follow Up Recommendations Other (comment) (will determine after surgery)    Equipment Recommendations  Other (comment) (TBA after surgery)    Recommendations for Other Services       Precautions / Restrictions Precautions Precautions: Fall Restrictions Weight Bearing Restrictions: No      Mobility  Bed Mobility Overal bed mobility:  Needs Assistance Bed Mobility: Supine to Sit     Supine to sit: Independent        Transfers Overall transfer level: Needs assistance Equipment used: None Transfers: Sit to/from Stand Sit to Stand: Supervision            Ambulation/Gait Ambulation/Gait assistance: Min guard Ambulation Distance (Feet): 300 Feet Assistive device: None Gait Pattern/deviations: Step-through pattern;Decreased stride length;Trunk flexed   Gait velocity interpretation: <1.8 ft/sec, indicative of risk for recurrent falls General Gait Details: Pt without LOB with gait with no challenges given.  Did not need steadying however pt reports that he has episodes of dizziness.  No dizziness could be provoked today.    Stairs            Wheelchair Mobility    Modified Rankin (Stroke Patients Only)       Balance Overall balance assessment: Needs assistance;History of Falls         Standing balance support: No upper extremity supported;During functional activity Standing balance-Leahy Scale: Fair Standing balance comment: Can stand statically without LOB.                              Pertinent Vitals/Pain Pain Assessment: 0-10 Pain Score: 7  Pain Location: headache Pain Descriptors / Indicators: Aching Pain Intervention(s): Limited activity within patient's tolerance;Monitored during session;Repositioned  VSS    Home Living Family/patient expects to be discharged to:: Private residence Living Arrangements: Spouse/significant other Available Help at Discharge: Family;Available PRN/intermittently Type of Home: House  Home Equipment: None Additional Comments: Pt to stay with family to recover in [place]. Trying to find his own place per chart.     Prior Function Level of Independence: Independent               Hand Dominance        Extremity/Trunk Assessment   Upper Extremity Assessment: Defer to OT evaluation           Lower Extremity  Assessment: Generalized weakness      Cervical / Trunk Assessment: Kyphotic;Other exceptions (forward head and shoulders)  Communication   Communication: No difficulties  Cognition Arousal/Alertness: Awake/alert Behavior During Therapy: WFL for tasks assessed/performed Overall Cognitive Status: Within Functional Limits for tasks assessed                      General Comments General comments (skin integrity, edema, etc.): Difficult vertigo assessment given that per chart pt needs the ACDF due to possible spinal cord injury imminent due to instability.  Much of the vertigo assessment requires neck movement.  Limited vestibular eval performed due to this.  Modified Hallpike produced no symptoms and appears negative however unable to turn neck as much as needed.  Suspect BPPV given pts report of symptoms but just don't feel comfortable testing pt further.  Informed pt that this PT would talk to MD about getting medicine to treat if he has symptoms while recovering from ACDF and then he can go to [facility] for treatment once recovered.  Pt could also have Migraine related dizziness but again, can't treat until this surgery is complete due to contraindication of neck movement at this time.      Exercises        Assessment/Plan    PT Assessment Patient needs continued PT services  PT Diagnosis Generalized weakness   PT Problem List Decreased activity tolerance;Decreased mobility  PT Treatment Interventions DME instruction;Gait training;Functional mobility training;Therapeutic activities;Therapeutic exercise;Balance training;Patient/family education   PT Goals (Current goals can be found in the Care Plan section) Acute Rehab PT Goals Patient Stated Goal: to get better PT Goal Formulation: With patient Time For Goal Achievement: 12/03/14 Potential to Achieve Goals: Good    Frequency Min 3X/week   Barriers to discharge        Co-evaluation               End of Session  Equipment Utilized During Treatment: Gait belt Activity Tolerance: Patient tolerated treatment well Patient left: in bed;with call bell/phone within reach Nurse Communication: Mobility status         Time: 4825-0037 PT Time Calculation (min) (ACUTE ONLY): 28 min   Charges:   PT Evaluation $Initial PT Evaluation Tier I: 1 Procedure PT Treatments $Gait Training: 8-22 mins   PT G CodesDenice Paradise 2014-12-05, 12:26 PM Unique Sillas,PT Acute Rehabilitation [phone] (pager)

## 2014-11-19 NOTE — Anesthesia Postprocedure Evaluation (Signed)
  Anesthesia Post-op Note  Patient: John Patton  Procedure(s) Performed: Procedure(s) with comments: Cervical four-five Anterior cervical decompression/diskectomy/fusion (N/A) - C4-5 Anterior cervical decompression/diskectomy/fusion  Patient Location: PACU  Anesthesia Type: General   Level of Consciousness: awake, alert  and oriented  Airway and Oxygen Therapy: Patient Spontanous Breathing  Post-op Pain: mild  Post-op Assessment: Post-op Vital signs reviewed  Post-op Vital Signs: Reviewed  Last Vitals:  Filed Vitals:   11/19/14 1810  BP: 133/68  Pulse: 61  Temp:   Resp: 14    Complications: No apparent anesthesia complications

## 2014-11-19 NOTE — Progress Notes (Signed)
Spoke with John Patton this AM regarding pt NPO/CBG checks/insulin orders. Ordered to hold insulin until AM and call if CBG greater than 150. Pt also requesting vicadin instead of percocet, pt states percocet is too strong for him. New order for home vicadin dose.

## 2014-11-19 NOTE — Progress Notes (Signed)
Patient ID: John Patton, male   DOB: 20-Jun-1946, 69 y.o.   MRN: [id number] Vital signs are stable. Motor function is intact. Patient is for anterior cervical decompression C4-C5.

## 2014-11-19 NOTE — Progress Notes (Signed)
Patient ID: John Patton, male   DOB: May 22, 1946, 69 y.o.   MRN: [id number] I will signs are stable Motor function appears intact in upper extremities Lower extremity is her strong Stable postop

## 2014-11-19 NOTE — Anesthesia Procedure Notes (Signed)
Procedure Name: Intubation Date/Time: 11/19/2014 4:34 PM Performed by: ADAMI, RICHARD Pre-anesthesia Checklist: Patient identified, Timeout performed, Emergency Drugs available, Suction available and Patient being monitored Patient Re-evaluated:Patient Re-evaluated prior to inductionOxygen Delivery Method: Circle system utilized Preoxygenation: Pre-oxygenation with 100% oxygen Intubation Type: IV induction Ventilation: Mask ventilation without difficulty and Oral airway inserted - appropriate to patient size Grade View: Grade II Tube type: Oral Tube size: 7.5 mm Airway Equipment and Method: Stylet,  LTA kit utilized and Video-laryngoscopy Placement Confirmation: ETT inserted through vocal cords under direct vision,  breath sounds checked- equal and bilateral and positive ETCO2 Secured at: 22 cm Tube secured with: Tape Dental Injury: Teeth and Oropharynx as per pre-operative assessment      

## 2014-11-19 NOTE — Progress Notes (Signed)
Dr. Ellene Route called regarding his pt being on 3E and asked if pt will be transferred to neurology floor for quality patient care after surgery. Dr. Ellene Route stated pt will be transferred after surgery.  Maurene Capes RN

## 2014-11-19 NOTE — Progress Notes (Signed)
Pt picked up by OR to be taken for neck surgery. Pt will not be returning to 3e but will be transferred to 4n per Dr. Waldron Labs.

## 2014-11-19 NOTE — Addendum Note (Signed)
Addendum  created 11/19/14 1854 by Eligha Bridegroom, CRNA   Modules edited: Anesthesia Flowsheet, Anesthesia Medication Administration, Charges VN

## 2014-11-19 NOTE — Progress Notes (Addendum)
CSW order received to assist patient with SNF placement.  MD: Please order OT evaluation as soon as appropriate- will be needed for insurance auth and referral. (PT order in placed.   CSW will meet with patient and significant other to initiate process today.  Lorie Phenix. Pauline Good, [place]

## 2014-11-19 NOTE — Progress Notes (Addendum)
Pt insulin pump discontinued per MD order, pt requested that pump remain in his black belonging bag.

## 2014-11-19 NOTE — Progress Notes (Signed)
Neuro OR called and instructed to let Dr. Ellene Route who is in OR know that there are no orders received for pt surgery scheduled to be picked up at 1330. Pt has been NPO since midnight and has been prepped with CHG bath. Awaiting orders from Dr. Ellene Route. Will continue to monitor pt.       Maurene Capes RN

## 2014-11-19 NOTE — Transfer of Care (Signed)
Immediate Anesthesia Transfer of Care Note  Patient: John Patton  Procedure(s) Performed: Procedure(s) with comments: Cervical four-five Anterior cervical decompression/diskectomy/fusion (N/A) - C4-5 Anterior cervical decompression/diskectomy/fusion  Patient Location: PACU  Anesthesia Type:General  Level of Consciousness: awake, alert  and oriented  Airway & Oxygen Therapy: Patient Spontanous Breathing and Patient connected to nasal cannula oxygen  Post-op Assessment: Report given to RN and Post -op Vital signs reviewed and stable  Post vital signs: Reviewed and stable  Last Vitals:  Filed Vitals:   11/19/14 0957  BP: 182/92  Pulse: 70  Temp:   Resp: 18    Complications: No apparent anesthesia complications

## 2014-11-20 ENCOUNTER — Encounter (HOSPITAL_COMMUNITY): Payer: Self-pay | Admitting: Neurological Surgery

## 2014-11-20 LAB — GLUCOSE, CAPILLARY
GLUCOSE-CAPILLARY: 266 mg/dL — AB (ref 70–99)
GLUCOSE-CAPILLARY: 336 mg/dL — AB (ref 70–99)
GLUCOSE-CAPILLARY: 379 mg/dL — AB (ref 70–99)
Glucose-Capillary: 362 mg/dL — ABNORMAL HIGH (ref 70–99)

## 2014-11-20 MED ORDER — OXYCODONE-ACETAMINOPHEN 5-325 MG PO TABS: 1.0000 | ORAL_TABLET | ORAL | Status: AC | PRN

## 2014-11-20 MED ORDER — DIAZEPAM 5 MG PO TABS: 5.0000 mg | ORAL_TABLET | Freq: Four times a day (QID) | ORAL | Status: AC | PRN

## 2014-11-20 NOTE — Clinical Social Work Note (Addendum)
CSW Consult Acknowledged:   CSW received a consult for SNF placement. CSW awaiting PT/OT evaluation to determine the appropriate level of care.      Addendum: Per PT evaluation the appropriate level of care is outpatient PT. The pt decline SNF placement. The pt reported after discharge he and his signifiant other will drive back to [facility]. CSW will sign off.   [place], MSW, [place]

## 2014-11-20 NOTE — Progress Notes (Signed)
Patient arrived to 4N18 from PACU, alert and oriented. Patient oriented to room and equipment. Cervical incision is clean and dry. MAEW. No acute distress noted. Will monitor closely overnight.

## 2014-11-20 NOTE — Progress Notes (Signed)
   11/19/14 1230  PT G-Codes **NOT FOR INPATIENT CLASS**  Functional Assessment Tool Used clinical judement  Functional Limitation Mobility: Walking and moving around  Mobility: Walking and Moving Around Current Status (L2440) CI  Mobility: Walking and Moving Around Goal Status (N0272) CH   Late entry for miss G gode based on evaluation by Irwin Brakeman, PT.  Kittie Plater, PT, DPT Pager #: [phone] Office #: [phone]

## 2014-11-20 NOTE — Discharge Summary (Signed)
Physician Discharge Summary  Patient ID: John Patton MRN: [id number] DOB/AGE: [birth year] 69 y.o.  Admit date: 11/18/2014 Discharge date: 11/20/2014  Admission Diagnoses: Cervical spondylosis with myelopathy C4-C5 status post arthrodesis C5-C7  Discharge Diagnoses: Cervical spondylosis with myelopathy C4-C5, status post arthrodesis C5-C7 diabetes mellitus Principal Problem:   Vertigo Active Problems:   Elevated serum creatinine   Cervical stenosis of spine   Hx of CABG   HTN (hypertension)   IDDM (insulin dependent diabetes mellitus)   Insulin pump in place   Cervical spondylosis with myelopathy   Discharged Condition: good  Hospital Course: Patient was admitted after being seen emergency room because of some episodes that seem consistent with vertigo and falling down. It been scheduled for surgery on the 26 to decompress C4-C5. Workup included an MRI of the brain which did not disclose any abnormalities. Anterior cervical decompression was performed at C4-C5. Patient is better  Consults: Neurosurgery and [facility]  Significant Diagnostic Studies: None  Treatments: surgery: Anterior cervical decompression C4-C5 arthrodesis with structural allograft.  Discharge Exam: Blood pressure 121/100, pulse 75, temperature 98.2 F (36.8 C), temperature source Oral, resp. rate 18, height 5\' 10"  (1.778 m), weight 81.557 kg (179 lb 12.8 oz), SpO2 98 %. Incision is clean and dry motor function is intact in upper and lower extremities station and gait are normal at this time. Romberg's test is negative.  Disposition: Discharge home  Discharge Instructions    Call MD for:  redness, tenderness, or signs of infection (pain, swelling, redness, odor or green/yellow discharge around incision site)    Complete by:  As directed      Call MD for:  severe uncontrolled pain    Complete by:  As directed      Call MD for:  temperature >100.4    Complete by:  As directed      Diet - low  sodium heart healthy    Complete by:  As directed      Discharge instructions    Complete by:  As directed   Okay to shower. Do not apply salves or appointments to incision. No heavy lifting with the upper extremities greater than 15 pounds. May resume driving when not requiring pain medication and patient feels comfortable with doing so.     Increase activity slowly    Complete by:  As directed             Medication List    TAKE these medications        diazepam 5 MG tablet  Commonly known as:  VALIUM  Take 1 tablet (5 mg total) by mouth every 6 (six) hours as needed for muscle spasms.     gabapentin 300 MG capsule  Commonly known as:  NEURONTIN  Take 300 mg by mouth daily.     HYDROcodone-acetaminophen 5-325 MG per tablet  Commonly known as:  NORCO/VICODIN  Take 1 tablet by mouth every 6 (six) hours as needed. pain     losartan 100 MG tablet  Commonly known as:  COZAAR  Take 100 mg by mouth daily.     metoprolol tartrate 25 MG tablet  Commonly known as:  LOPRESSOR  Take 25 mg by mouth 2 (two) times daily.     omeprazole 40 MG capsule  Commonly known as:  PRILOSEC  Take 40 mg by mouth daily as needed. heartburn     oxyCODONE-acetaminophen 10-325 MG per tablet  Commonly known as:  PERCOCET  Take 1 tablet by mouth every  4 (four) hours as needed. pain     oxyCODONE-acetaminophen 5-325 MG per tablet  Commonly known as:  PERCOCET/ROXICET  Take 1-2 tablets by mouth every 4 (four) hours as needed for moderate pain.     pravastatin 40 MG tablet  Commonly known as:  PRAVACHOL  Take 40 mg by mouth daily.     tamsulosin 0.4 MG Caps capsule  Commonly known as:  FLOMAX  Take 0.4 mg by mouth daily.         SignedEarleen Newport 11/20/2014, 9:18 AM

## 2014-11-20 NOTE — Progress Notes (Signed)
Discharge instructions given. Pt verbalized understanding and all questions were answered.  

## 2014-11-20 NOTE — Evaluation (Signed)
Occupational Therapy Evaluation Patient Details Name: John Patton MRN: [id number] DOB: Mar 16, 1946 Today's Date: 11/20/2014    History of Present Illness Patient is a 69 year old male s/p C4-5 ACDF on 11/20/14. He seen in the ED this morning vertigo/pre-syncope and is being admitted by [facility] for further evaluation and pre-operative recommendations. Echo is pending, but current H&P from [facility], PA-C (awaiting co-sign) felt that given his recent CABG, he is a moderate perioperative risk for MI.    Clinical Impression   Pt s/p above. Education provided in session and pt leaving today. No further OT needs/OT signing off.    Follow Up Recommendations  No OT follow up;Supervision - Intermittent    Equipment Recommendations  None recommended by OT    Recommendations for Other Services       Precautions / Restrictions Precautions Precautions: Cervical;Fall Precaution Comments: discussed precautions in session Restrictions Weight Bearing Restrictions: No      Mobility Bed Mobility               General bed mobility comments: not assessed-instructed pt to use log roll technique which he verbalizes he knows how to do.  Transfers Overall transfer level: Modified independent                    Balance  No LOB in session- balance not formally assessed                                          ADL Overall ADL's : Needs assistance/impaired                 Upper Body Dressing : Set up;Supervision/safety;Sitting   Lower Body Dressing: Set up;Supervision/safety;Sit to/from stand   Toilet Transfer: Supervision/safety;Ambulation (bed)           Functional mobility during ADLs: Supervision/safety General ADL Comments: Discussed incorporating precautions into functional activities. Discussed UB clothing that could help pt maintain precautions. Educated on safety such as sitting for LB ADLs (pt states he usually stands to  get LB clothing on), and rugs/items on floor. Educated on LB ADL technique of crossing legs over knees.  Recommended pt avoid sharp/dangerous items/hot surfaces due to decreased sensation in Rt hand. Pt felt that fine motor coordination was off slightly in right hand so encouraged him to be using it for activities and explained some he could be doing.     Vision     Perception     Praxis      Pertinent Vitals/Pain Pain Assessment: 0-10 Pain Score:  (7-8) Pain Location: neck Pain Descriptors / Indicators: Aching Pain Intervention(s): Monitored during session     Hand Dominance     Extremity/Trunk Assessment Upper Extremity Assessment Upper Extremity Assessment: RUE deficits/detail RUE Sensation: decreased light touch RUE Coordination:  (fine motor coordination looked fine in evaluation/WFL, but pt felt like it was off)    Lower Extremity Assessment Lower Extremity Assessment: Defer to PT evaluation       Communication Communication Communication: No difficulties   Cognition Arousal/Alertness: Awake/alert Behavior During Therapy: WFL for tasks assessed/performed Overall Cognitive Status: Within Functional Limits for tasks assessed (VERY talkative)                     General Comments       Exercises       Shoulder Instructions  Home Living Family/patient expects to be discharged to:: Private residence Living Arrangements: Spouse/significant other Available Help at Discharge: Family;Available PRN/intermittently Type of Home: House             Bathroom Shower/Tub: Walk-in shower         Home Equipment: Shower seat   Additional Comments: Pt to stay with family to recover in [place]. Trying to find his own place per chart.       Prior Functioning/Environment Level of Independence: Independent             OT Diagnosis: Acute pain   OT Problem List:     OT Treatment/Interventions:      OT Goals(Current goals can be found in the  care plan section)    OT Frequency:     Barriers to D/C:            Co-evaluation              End of Session    Activity Tolerance: Patient tolerated treatment well Patient left: with family/visitor present;Other (comment) (up in room)   Time: 1140-1158 OT Time Calculation (min): 18 min Charges:  OT General Charges $OT Visit: 1 Procedure OT Evaluation $Initial OT Evaluation Tier I: 1 Procedure G-Codes: OT G-codes **NOT FOR INPATIENT CLASS** Functional Assessment Tool Used: clinical judgment Functional Limitation: Self care Self Care Current Status (J8832): At least 1 percent but less than 20 percent impaired, limited or restricted Self Care Goal Status (P4982): At least 1 percent but less than 20 percent impaired, limited or restricted Self Care Discharge Status [phone]): At least 1 percent but less than 20 percent impaired, limited or restricted  Benito Mccreedy OTR/L [phone] 11/20/2014, 1:24 PM

## 2014-11-20 NOTE — Progress Notes (Signed)
Physical Therapy Treatment Patient Details Name: John Patton MRN: [id number] DOB: 1946/04/23 Today's Date: 11/20/2014    History of Present Illness Patient is a 69 year old male s/p C4-5 ACDF on 11/20/14. He seen in the ED this morning vertigo/pre-syncope and is being admitted by [facility] for further evaluation and pre-operative recommendations. Echo is pending, but current H&P from [facility], PA-C (awaiting co-sign) felt that given his recent CABG, he is a moderate perioperative risk for MI.     PT Comments    Pt received sitting on futon with back rounded and wife in bed, stated that they switch places every so often due to the fact that she has back problems.  Pt demonstrated supervision level function with ambulation and transfers with no AD, but had mild gait deviations.  Pt lives in [place] with wife.  They plan on driving back 9 hours after medically cleared, but have family locally they can stay with for a few days.  Recommend home with outpatient neuroPT for vestibular dysfunction once medically cleared by neurology.  Acute PT to follow to address remaining balance and gait deviations.     Follow Up Recommendations  Outpatient PT;Supervision - Intermittent (Outpatient neuroPT once cleared by neurology)     Equipment Recommendations  None recommended by PT    Recommendations for Other Services       Precautions / Restrictions Precautions Precautions: Cervical;Fall Precaution Comments: Gave pt precations handout, reviewed precations with pt Restrictions Weight Bearing Restrictions: No    Mobility  Bed Mobility               General bed mobility comments: Pt received sitting on futon with wife in bed  Transfers Overall transfer level: Needs assistance Equipment used: None Transfers: Sit to/from Stand Sit to Stand: Supervision         General transfer comment: Supervision for safety only  Ambulation/Gait Ambulation/Gait assistance:  Supervision Ambulation Distance (Feet): 450 Feet Assistive device: None Gait Pattern/deviations: Step-through pattern;Drifts right/left     General Gait Details: Pt able to ambulate with supervision, and demonstrated slight staggering/drifting during ambulation; would go from scissoring to wide steps, and drift left and right.  Pt had noted fatigue after 300 feet.  Pt stated he is able to walk better without staggering at baseline.   Stairs            Wheelchair Mobility    Modified Rankin (Stroke Patients Only)       Balance Overall balance assessment: Needs assistance;History of Falls         Standing balance support: No upper extremity supported;During functional activity Standing balance-Leahy Scale: Fair Standing balance comment: Able to ambulate with no UE support, however slight unsteadiness noted with ambulation                    Cognition Arousal/Alertness: Awake/alert Behavior During Therapy: WFL for tasks assessed/performed;Impulsive Overall Cognitive Status: Within Functional Limits for tasks assessed                      Exercises      General Comments General comments (skin integrity, edema, etc.): No complaints of vertigo today. Lives in [place], educated on maintaining precautions during the drive back.       Pertinent Vitals/Pain Pain Assessment: 0-10 Pain Score: 8  Pain Location: neck Pain Descriptors / Indicators: Sore Pain Intervention(s): Monitored during session    Home Living  Prior Function            PT Goals (current goals can now be found in the care plan section) Acute Rehab PT Goals Patient Stated Goal: Go back to [place] Progress towards PT goals: Progressing toward goals    Frequency  Min 3X/week    PT Plan Frequency needs to be updated    Co-evaluation             End of Session Equipment Utilized During Treatment: Gait belt Activity Tolerance: Patient tolerated  treatment well Patient left: in chair;with call bell/phone within reach;with family/visitor present     Time: 0730-0757 PT Time Calculation (min) (ACUTE ONLY): 27 min  Charges:  $Gait Training: 8-22 mins $Therapeutic Activity: 8-22 mins                    G Codes:      John Patton 2014-12-17, 9:48 AM  John Patton, SPT (student physical therapist) Office phone: [phone]

## 2015-01-20 ENCOUNTER — Other Ambulatory Visit: Payer: Self-pay

## 2016-01-05 IMAGING — DX DG CHEST 2V
2 series · 2 of 2 positions shown · non-contrast
Comparison: None.

CLINICAL DATA: Preoperative examination. Patient for cervical
surgery tomorrow.

EXAM:
CHEST  2 VIEW

[chest lat]
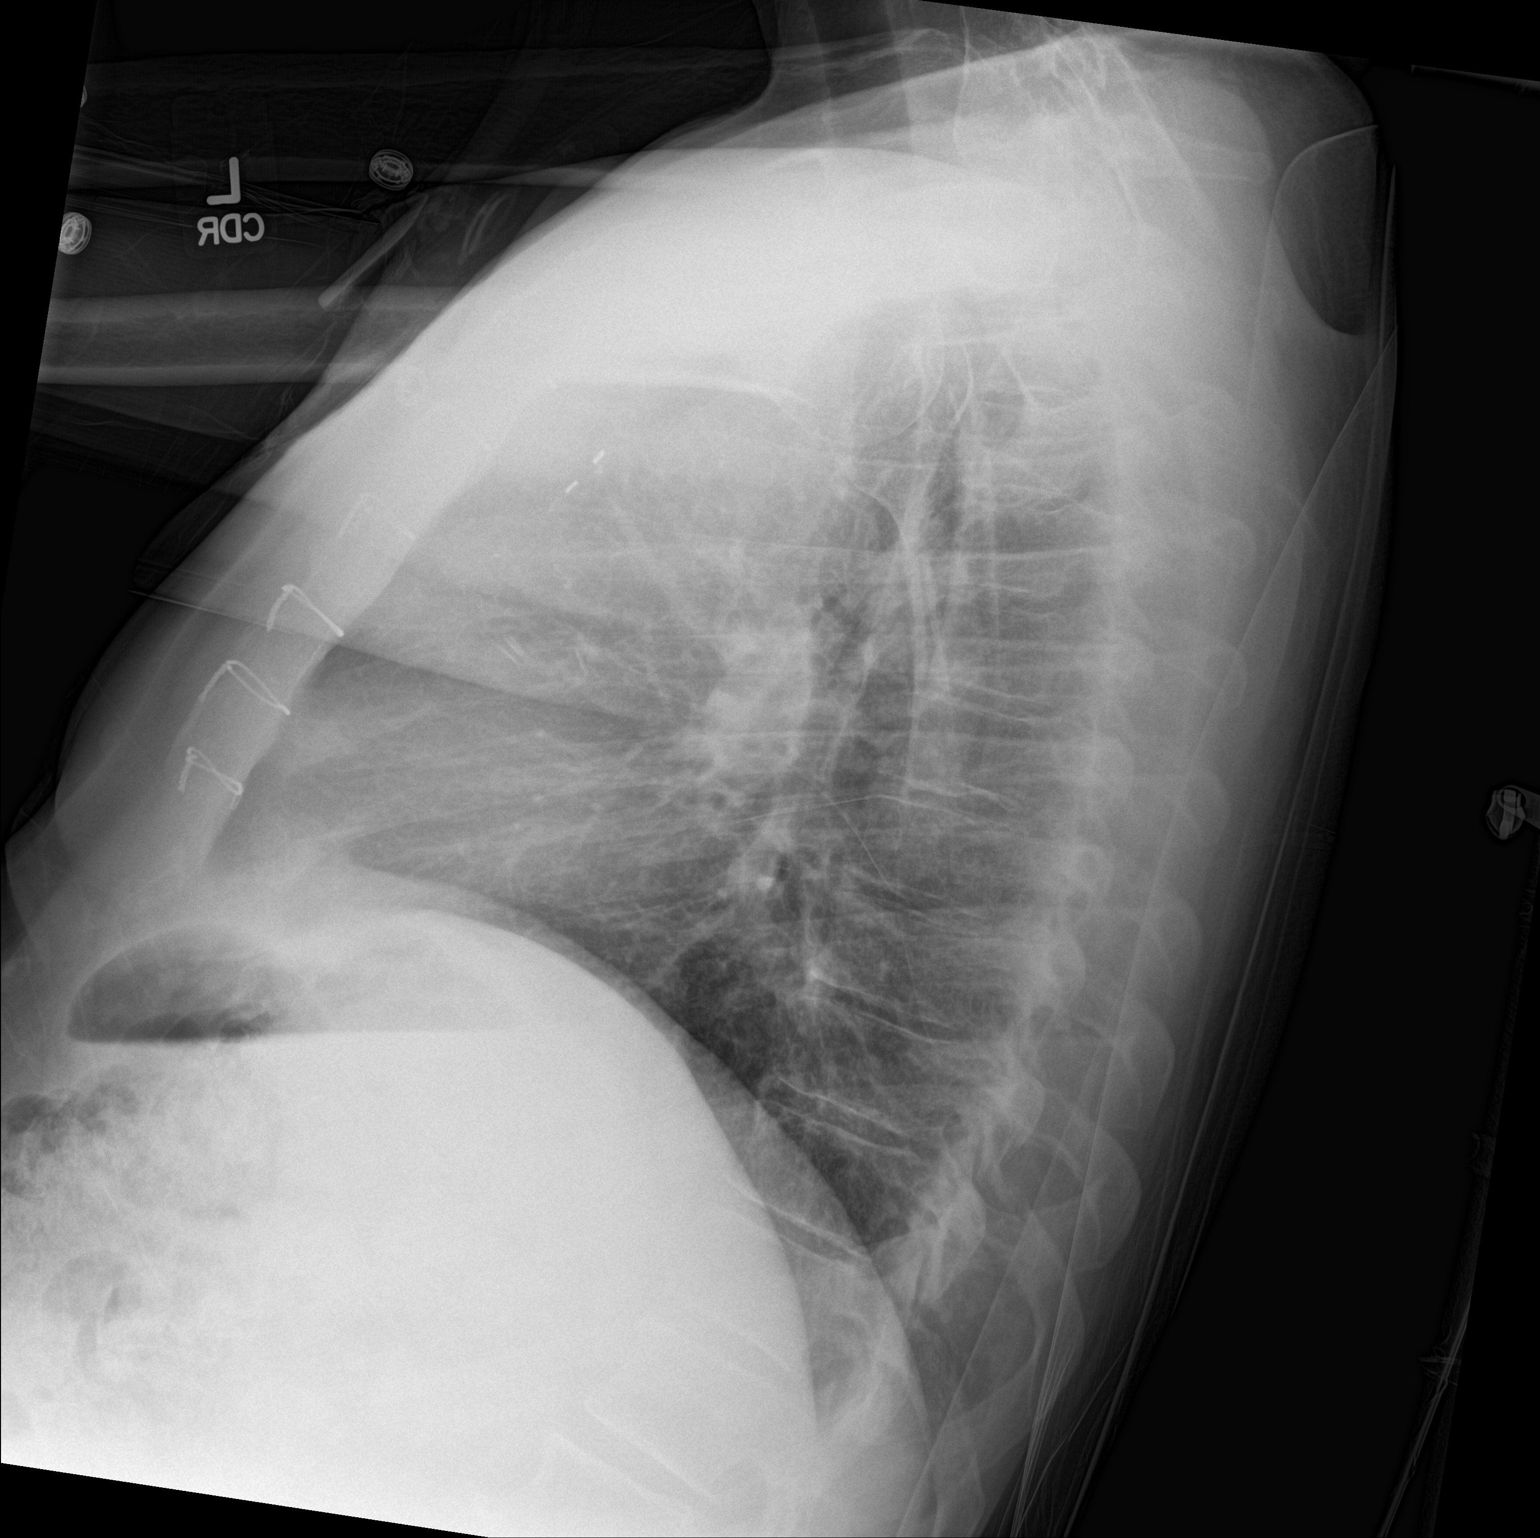

[chest ap]
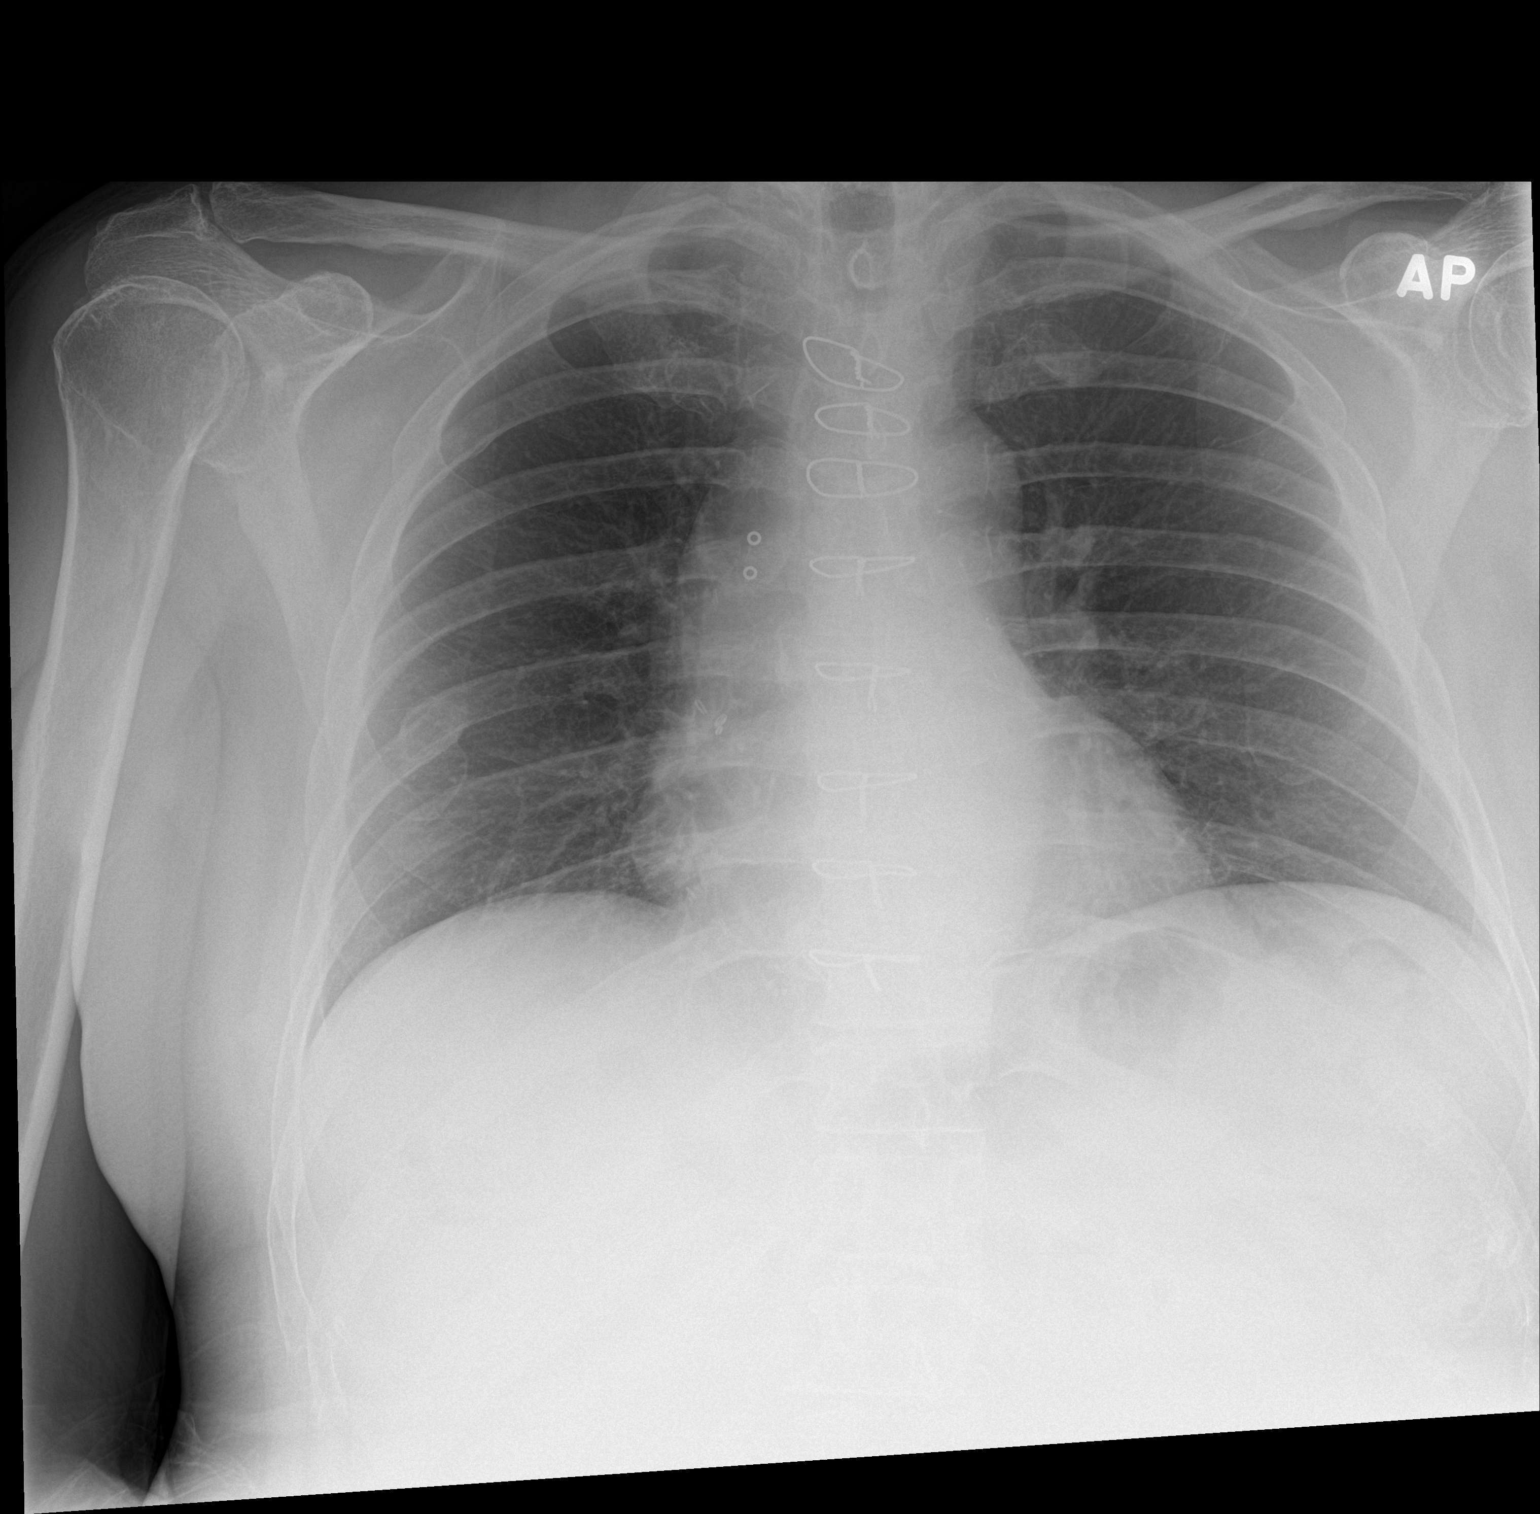

[2 of 2 positions shown; findings below may reference images not displayed]

FINDINGS: The patient is status post CABG. The lungs are clear. Heart size is
normal. No pneumothorax or pleural effusion. Remote right seventh
rib fracture is noted.
IMPRESSION: No acute disease.

## 2016-01-06 IMAGING — CR DG CERVICAL SPINE 2 OR 3 VIEWS
2 series · 2 of 2 positions shown · non-contrast
Comparison: None.

CLINICAL DATA: Anterior cervical fusion

EXAM:
CERVICAL SPINE - 2-3 VIEW

[lat (1 of 2)]
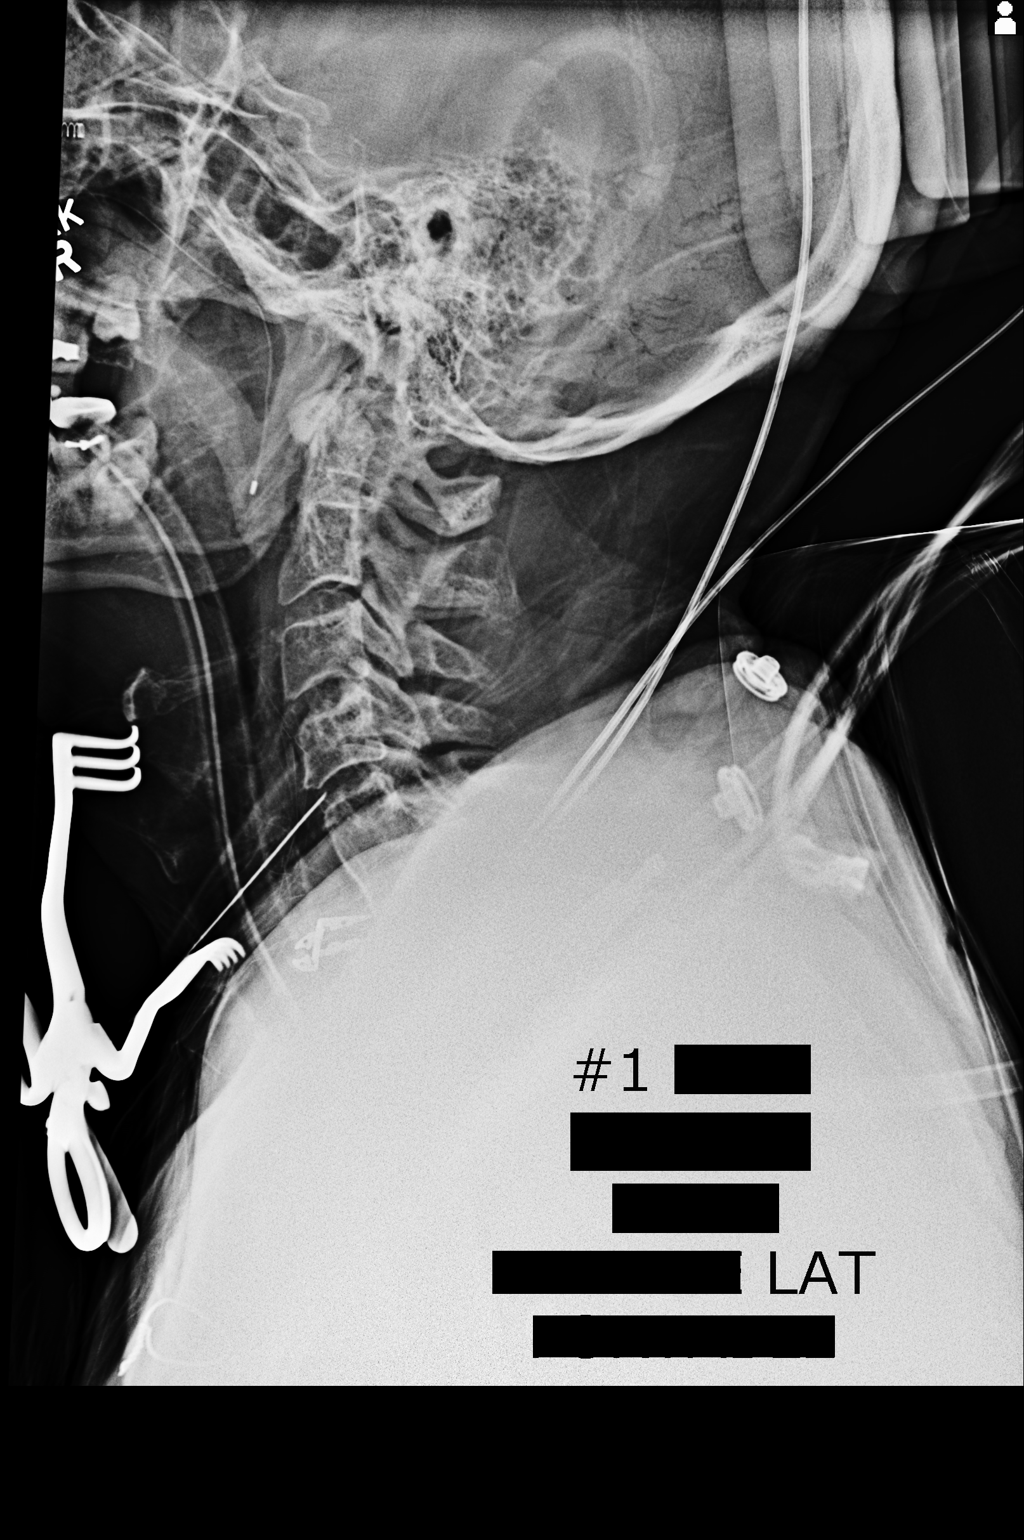

[lat (2 of 2)]
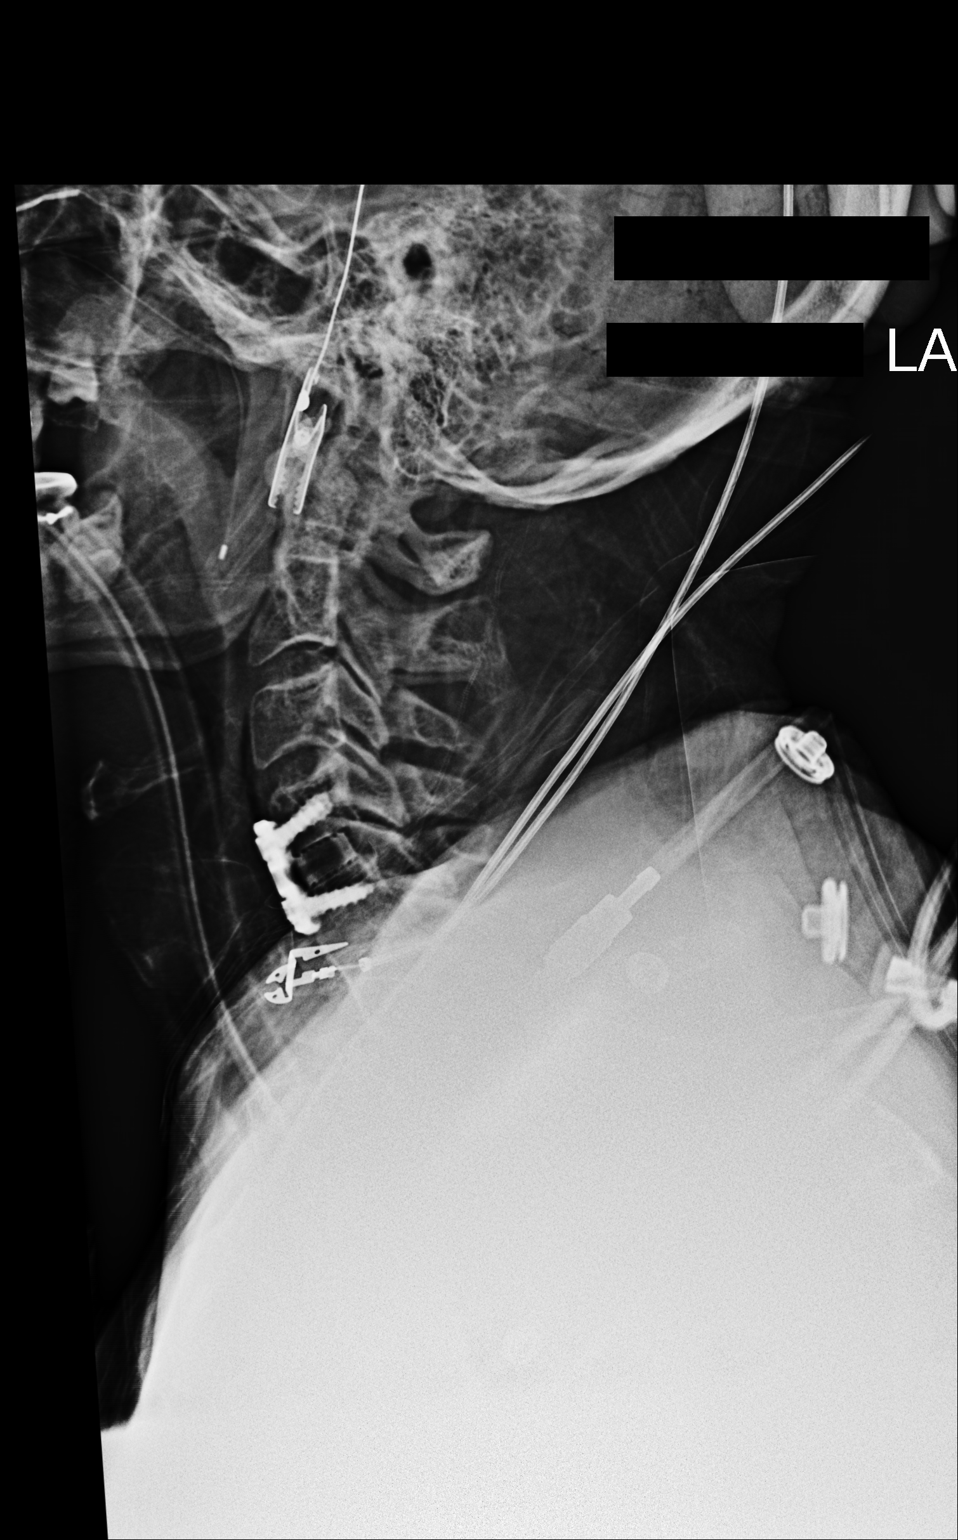

[2 of 2 positions shown; findings below may reference images not displayed]

FINDINGS: Two intraoperative cross-table lateral portable radiographs of the
cervical spine are provided. The first radiograph demonstrates a
anterior metallic needle with the tip projecting over the C4-5 disc
space. The second image demonstrates anterior cervical fusion at
C4-5 without failure or complication.
IMPRESSION: Anterior cervical disc fusion at C4-5.

## 2016-02-02 ENCOUNTER — Inpatient Hospital Stay: Admit: 2016-02-02 | Discharge: 2016-02-03 | Primary: Family Medicine

## 2016-02-02 ENCOUNTER — Ambulatory Visit: Attending: Orthopaedic Surgery | Primary: Family Medicine

## 2016-02-02 DIAGNOSIS — M25511 Pain in right shoulder: Principal | ICD-10-CM

## 2016-02-02 DIAGNOSIS — M7521 Bicipital tendinitis, right shoulder: Principal | ICD-10-CM

## 2016-02-02 DIAGNOSIS — C801 Malignant (primary) neoplasm, unspecified: Secondary | ICD-10-CM

## 2016-02-02 DIAGNOSIS — M19011 Primary osteoarthritis, right shoulder: Secondary | ICD-10-CM

## 2016-02-02 DIAGNOSIS — N189 Chronic kidney disease, unspecified: Secondary | ICD-10-CM

## 2016-02-02 DIAGNOSIS — E119 Type 2 diabetes mellitus without complications: Principal | ICD-10-CM

## 2016-02-02 DIAGNOSIS — I10 Essential (primary) hypertension: Secondary | ICD-10-CM

## 2016-02-02 DIAGNOSIS — I2510 Atherosclerotic heart disease of native coronary artery without angina pectoris: Secondary | ICD-10-CM

## 2016-02-02 MED ORDER — CEPHALEXIN 500 MG PO CAPS
500 mg | Freq: Three times a day (TID) | ORAL | 0 refills | Status: CP
Start: 2016-02-02 — End: 2016-03-31

## 2016-02-02 NOTE — Progress Notes
?   History of cutaneous melanoma   ? Status post cervical spinal fusion   ? Hammer toe   ? Plantar fibromatosis   ? Osteoarthritis of the knees   ? CAD (coronary artery disease)   ? History of 3V CABG in 2015   ? History of myocardial infarction in 2015   ? CKD (chronic kidney disease)   ? Cervical spinal stenosis   ? History of shingles   ? Status post insulin pump placement   ? DOE (dyspnea on exertion)   ? Right rotator cuff tendinopathy   ? Biceps tendinitis of the right upper extremity   ? Allergic rhinitis   ? Diastolic dysfunction       PMH:  Past Medical History:   Diagnosis Date   ? CAD (coronary artery disease)    ? Cancer    ? CKD (chronic kidney disease) 08/20/2014   ? Diabetes mellitus    ? Hypertension        PSH:  Past Surgical History:   Procedure Laterality Date   ? CARDIAC CATHERIZATION     ? CERVICAL FUSION     ? CORONARY ANGIOPLASTY     ? CORONARY ARTERY BYPASS GRAFT         Social Hx:  Social History     Social History   ? Marital status: Married     Spouse name: N/A   ? Number of children: N/A   ? Years of education: N/A     Occupational History   ? Not on file.     Social History Main Topics   ? Smoking status: Never Smoker   ? Smokeless tobacco: Never Used   ? Alcohol use No   ? Drug use: No   ? Sexual activity: Yes     Partners: Female     Other Topics Concern   ? Not on file     Social History Narrative       Family Hx:  Family History   Problem Relation Age of Onset   ? Cancer Mother    ? Diabetes Father    ? Blindness Father    ? Cancer Sister    ? Cancer Brother        ROS:   Constitutional: denies fever, chills, or night sweats  HEENT: denies headaches, visual disturbance, or changes in hearing    Vital Signs:     VITAL SIGNS (all recorded)      Clinic Vitals       02/02/16 1256             Amb Encounter Vitals    Weight 87.5 kg (193 lb)    -DM at 02/02/16 1302       Height 1.778 m (5\' 10" )    -DM at 02/02/16 1302       BMI (Calculated) 27.75    -DM at 02/02/16 1302

## 2016-02-02 NOTE — Progress Notes
BSA (Calculated - sq m) 2.08    -DM at 02/02/16 1302       BP 148/87    -DM at 02/02/16 1302       BP Location Right upper arm    -DM at 02/02/16 1302       Position Sitting    -DM at 02/02/16 1302       Pulse 100    -DM at 02/02/16 1302       Pulse Source Radial    -DM at 02/02/16 1302       Pulse Quality Normal    -DM at 02/02/16 1302       Resp 18    -DM at 02/02/16 1302       Respiration Quality Normal    -DM at 02/02/16 1302       Temp 36.2 ?C (97.1 ?F)    -DM at 02/02/16 1302       Temperature Source Oral    -DM at 02/02/16 1302       Pain Score Three    -DM at 02/02/16 1302       Fall Risk Assessment    Had recent fall / Last 6 months? No recent fall    -DM at 02/02/16 1302       Does patient have a fear of falling? No    -DM at 02/02/16 1302         User Key  (r) = Recorded By, (t) = Taken By, (c) = Cosigned By    Initials Name Effective Dates    DM Ainsley SpinnerMcCraw, Dandria, MA 10/28/15 -             Physical Exam:  SHOULDER    Right  General Appearance: normal muscular tone AAAOX3  Incision: None  Scapular Motion: abnormal  AROM (left): FF 140 ABD 140 Ext Rot@0  50  Int Rot@0  lumbar   AROM (right): FF 140 ABD 120 Ext Rot@0  50  Int Rot@0  lumbar   PROM: FF 140 ABD 140 Ext Rot@0  50   Point tenderness: AC- GT- Post Cap+ Biceps+ Coracoid-    Lateral Impingement- Hawkins+ Neer- O'Brienequivocal Speed+ Spurling-  Pulses: Rad2+ Ulnar2+  Stability: Ant0 Post0  Muscle Strength: Jobe4/5 Ext rot5/5 Int Rot4/5 Tricep5/5 Bicep5/5 Grip5/5  Sensation: Radintact Ulnintact Medintact Axintact Muscintact          Test Conclusion:   Per Radiology: Xr Shoulder Right Complete 2 Or 3 Views    Result Date: 02/02/2016  Study: XR SHOULDER RIGHT COMPLETE 3 VIEWS History:69 years Male Right shoulder pain, unspecified chronicity COMPARISON: Dec 03, 2014     Findings/Impression: Negative for acute fracture or dislocation. Glenohumeral joint unremarkable. AC joint demonstrates very mild

## 2016-02-02 NOTE — Progress Notes
Department of Orthopedics   Established Patient Visit        Assessment:     ICD-10-CM ICD-9-CM   1. Right shoulder pain, unspecified chronicity M25.511 719.41   2. Biceps tendinitis of the right upper extremity M75.21 726.12         Plan:   Orders Placed This Encounter   Procedures   ? XR Shoulder Right Complete 2 or 3 views     No orders of the following type(s) were placed in this encounter: Medications.       Rt shoulder steroid injection today, he will monitor his blood sugars.   F/U PRN      Referring Physician/Primary Care Physician:  Gearlean AlfHadzic, [name], MD    Chief Complaint/Reason for Return:  Chief Complaint   Patient presents with   ? Shoulder Pain     right         HPI:  Gary Walters is a 70 yo RHD male presents to clinic for eval of Rt arm pain, onset about 2 years ago without injury. . Right upper arm pain radiates down the arm. Pain worse with overhead activities.   Has tried activity modification, ice with no relief. Heat provides some relief. He cannot take NSAIDs, on plavix. Denies N/T. He had an injection in Jan 2017 that helped his symptoms until recently. He would like a repeat injection at this time.     He is a Product managerisherman/ farmer/ retired. He is IDDM, his FSBS have been running in the 130's.  He does not smoke.   Allergies:  Allergies   Allergen Reactions   ? Metformin Other (See Comments)     Unspecified   ? Nsaids Other (See Comments)     ?Azotemia       Current Meds:  Current Outpatient Prescriptions   Medication Sig Dispense Refill   ? aspirin 81 MG tablet Take 1 Tablet by mouth daily. 30 Tablet 0   ? atorvastatin (LIPITOR) 40 MG Tablet Take 1 tablet by mouth daily. 90 tablet 3   ? cephALEXin (KEFLEX) 500 MG Capsule Take 1 capsule by mouth 3 times daily. 21 capsule 0   ? cetirizine (ZyrTEC) 10 MG Tablet Take 5 mg (1/2 tablet) by mouth daily. 90 tablet 1   ? clopidogrel (PLAVIX) 75 MG Tablet Take 1 tablet by mouth daily. 90 tablet 3

## 2016-02-02 NOTE — Progress Notes
degenerative changes. Median sternotomy wires noted. Read By Christianne Dolin- Dinesh Rao  Electronically Verified By - Christianne Dolininesh Rao  Released Date Time - 02/02/2016 3:16 PM  Resident -         Procedure:  Major Joint Aspiration/Injection  Date/Time: 02/02/2016 1:39 PM  Performed by: Venancio PoissonBRUNELLI, JEFFREY ALLEN  Authorized by: Venancio PoissonBRUNELLI, JEFFREY ALLEN   Preparation: Patient was prepped and draped in the usual sterile fashion.  Local anesthesia used: yes    Anesthesia:  Local anesthesia used: yes  Local Anesthetic: lidocaine 1% without epinephrine and bupivacaine 0.5% without epinephrine   Anesthetic total: 5 mL  Sedation:  Patient sedated: no    Patient tolerance: Patient tolerated the procedure well with no immediate complications  Comments: Right shoulder IA injection consisting of 3cc bupivacaine, 2 cc lidocaine, 1 cc (40mg ) Kenalog was performed  Verbal informed consent obtained prior to injection             Dr Hetty ElyBrunelli spent substantial face to face time with patient.

## 2016-02-02 NOTE — Progress Notes
?   gabapentin (NEURONTIN) 300 MG Capsule Take 1 capsule by mouth 2 times daily. 60 capsule 3   ? glucose blood Strip 1 strip as instructed 5 times daily. One Touch  DX: E11.40  Test 3-5 times daily 150 Each 11   ? HYDROcodone-acetaminophen (NORCO) 5-325 MG Tablet Take 1 tablet by mouth every 6 hours as needed for pain. Earliest Fill Date: 01/23/16 120 tablet 0   ? insulin aspart (NovoLOG) vial 100 units daily via insulin pump 9 vial 4   ? insulin glargine (LANTUS SOLOSTAR) 100 UNIT/ML Solution Pen-injector Inject 25 Units into the skin nightly at bedtime. 15 mL 6   ? ipratropium (ATROVENT) 0.06 % Solution Spray 1 spray in each nostril 4 times daily. 15 mL 1   ? losartan (COZAAR) 100 MG Tablet Take 1 Tablet by mouth daily. 1/2-1 pill a day (Patient taking differently: Take 50 mg by mouth daily. (1/2 tablet)) 90 Tablet 3   ? metoprolol succinate (TOPROL-XL) 25 MG Tablet Extended Release 24 Hour Take 1 tablet by mouth nightly at bedtime. 90 tablet 1   ? omeprazole (PriLOSEC) 40 MG Capsule Delayed Release Take 1 capsule by mouth daily. 90 capsule 3   ? ONE TOUCH ULTRA 2 W/DEVICE Kit Use as instructed. 1 kit 1   ? ONE TOUCH ULTRA TEST Strip USE ONE STRIP TO CHECK GLUCOSE THREE TO FIVE TIMES DAILY BEFORE MEAL(S) AS INSTRUCTED 50 strip 0   ? ONE TOUCH ULTRA TEST Strip USE 1 STRIP TO CHECK GLUCOSE 3 TO 5 TIMES DAILY BEFORE MEAL(S) AS INSTRUCTED 50 strip 0   ? sildenafil (VIAGRA) 100 MG tablet Take 1 Tablet by mouth as needed for erectile dysfunction. 5 Tablet 3     No current facility-administered medications for this visit.        Active Problems:  Patient Active Problem List   Diagnosis   ? Diabetes mellitus   ? BPH (benign prostatic hyperplasia)   ? Esophageal dysmotility   ? History of noncompliance   ? Unspecified cataract   ? Chronic headaches   ? Vitamin B12 deficiency   ? GERD (gastroesophageal reflux disease)   ? Labile essential hypertension   ? Dyslipidemia   ? Erectile dysfunction

## 2016-02-03 NOTE — Progress Notes
Teaching/Attestation Statement  I saw and evaluated the patient.  I reviewed the Nurse Practitioner note and agree with the assessment and plan. See Nurse Practitioner's note for details.

## 2016-02-23 DIAGNOSIS — I25810 Atherosclerosis of coronary artery bypass graft(s) without angina pectoris: Principal | ICD-10-CM

## 2016-02-23 DIAGNOSIS — I10 Essential (primary) hypertension: Secondary | ICD-10-CM

## 2016-02-23 MED ORDER — METOPROLOL TARTRATE 100 MG PO TABS
50 mg | Freq: Two times a day (BID) | ORAL
Start: 2016-02-23 — End: 2016-08-12

## 2016-02-23 MED ORDER — CLOPIDOGREL BISULFATE 75 MG PO TABS
75 mg | Freq: Every day | ORAL | 0 refills | Status: CP
Start: 2016-02-23 — End: 2016-08-16

## 2016-02-23 MED ORDER — LOSARTAN POTASSIUM-HCTZ 100-25 MG PO TABS
ORAL_TABLET
Start: 2016-02-23 — End: 2016-02-23

## 2016-02-23 MED ORDER — LOSARTAN POTASSIUM-HCTZ 100-25 MG PO TABS
ORAL_TABLET | 0 refills | Status: CP
Start: 2016-02-23 — End: 2016-08-02

## 2016-02-23 MED ORDER — AMLODIPINE BESYLATE 10 MG PO TABS
0 refills | Status: CP
Start: 2016-02-23 — End: 2016-04-19

## 2016-02-23 NOTE — Telephone Encounter
Called and spoke with Pt advised of recomendations pt stated that since yesterday he switched the losartan 50mg  to Losartan HCTZ 50 Mg twice a day since he ran out of the prescribed losartan    Pt requested to speak with ARNP to discuss.

## 2016-02-23 NOTE — Addendum Note
Addended by: Malcolm Metro on: 02/23/2016 02:48 PM      Modules accepted: Orders

## 2016-02-23 NOTE — Telephone Encounter
Called and spoke with Pt advised  Pt requested to speak with ARNP to discuss

## 2016-02-23 NOTE — Telephone Encounter
I spoke with the patient and clarified his current antihypertensive regimen:  Metoprolol tartrate 50 mg bid  Losartan/HCTZ 50/12.5 mg bid    Of note, the patient is NOT TAKING AMLODIPINE. I instructed him to resume Amlodipine at 5 mg daily immediately. I also sent a prescription to the [facility] in [place], [place] per his request.    SKS

## 2016-02-23 NOTE — Telephone Encounter
Called and advised pt of recommendations. Pt stated that he's been taking Losartan/HCTZ twice daily since yesterday because he ran out of Losartan. He also requested to speak with the ARNP to discuss his issue.

## 2016-03-16 ENCOUNTER — Ambulatory Visit: Attending: Family Medicine | Primary: Family Medicine

## 2016-03-16 DIAGNOSIS — N183 Chronic kidney disease, stage 3 (moderate): Secondary | ICD-10-CM

## 2016-03-16 DIAGNOSIS — Z23 Encounter for immunization: Secondary | ICD-10-CM

## 2016-03-16 DIAGNOSIS — N189 Chronic kidney disease, unspecified: Secondary | ICD-10-CM

## 2016-03-16 DIAGNOSIS — I10 Essential (primary) hypertension: Secondary | ICD-10-CM

## 2016-03-16 DIAGNOSIS — C801 Malignant (primary) neoplasm, unspecified: Secondary | ICD-10-CM

## 2016-03-16 DIAGNOSIS — I25810 Atherosclerosis of coronary artery bypass graft(s) without angina pectoris: Secondary | ICD-10-CM

## 2016-03-16 DIAGNOSIS — I2510 Atherosclerotic heart disease of native coronary artery without angina pectoris: Secondary | ICD-10-CM

## 2016-03-16 DIAGNOSIS — E119 Type 2 diabetes mellitus without complications: Secondary | ICD-10-CM

## 2016-03-16 DIAGNOSIS — M542 Cervicalgia: Secondary | ICD-10-CM

## 2016-03-16 DIAGNOSIS — M4802 Spinal stenosis, cervical region: Secondary | ICD-10-CM

## 2016-03-16 DIAGNOSIS — Z6826 Body mass index (BMI) 26.0-26.9, adult: Principal | ICD-10-CM

## 2016-03-16 MED ORDER — HYDROCODONE-ACETAMINOPHEN 5-325 MG PO TABS
1 | ORAL_TABLET | Freq: Four times a day (QID) | ORAL | 0 refills | Status: CP | PRN
Start: 2016-03-16 — End: 2016-04-26

## 2016-03-16 NOTE — Progress Notes
?   glucose blood Strip 1 strip as instructed 5 times daily. One Touch  DX: E11.40  Test 3-5 times daily   ? [DISCONTINUED] HYDROcodone-acetaminophen (NORCO) 5-325 MG Tablet Take 1 tablet by mouth every 6 hours as needed for pain. Earliest Fill Date: 01/23/16   ? insulin aspart (NovoLOG) vial 100 units daily via insulin pump   ? insulin glargine (LANTUS SOLOSTAR) 100 UNIT/ML Solution Pen-injector Inject 25 Units into the skin nightly at bedtime.   ? ipratropium (ATROVENT) 0.06 % Solution Spray 1 spray in each nostril 4 times daily.   ? losartan (COZAAR) 100 MG Tablet Take 1 Tablet by mouth daily. 1/2-1 pill a day (Patient taking differently: Take 50 mg by mouth daily. (1/2 tablet))   ? losartan-hydroCHLOROthiazide (HYZAAR) 100-25 MG Tablet Take 50/12.5 mg (1/2 tablet) by mouth 2 times daily.   ? metoprolol succinate (TOPROL-XL) 25 MG Tablet Extended Release 24 Hour Take 1 tablet by mouth nightly at bedtime.   ? metoprolol tartrate (LOPRESSOR) 100 MG Tablet Take 50 mg by mouth 2 times daily.   ? omeprazole (PriLOSEC) 40 MG Capsule Delayed Release Take 1 capsule by mouth daily.   ? ONE TOUCH ULTRA 2 W/DEVICE Kit Use as instructed.   ? ONE TOUCH ULTRA TEST Strip USE ONE STRIP TO CHECK GLUCOSE THREE TO FIVE TIMES DAILY BEFORE MEAL(S) AS INSTRUCTED   ? ONE TOUCH ULTRA TEST Strip USE 1 STRIP TO CHECK GLUCOSE 3 TO 5 TIMES DAILY BEFORE MEAL(S) AS INSTRUCTED   ? sildenafil (VIAGRA) 100 MG tablet Take 1 Tablet by mouth as needed for erectile dysfunction.     No current facility-administered medications on file prior to visit.      Allergies   Allergen Reactions   ? Metformin Other (See Comments)     Unspecified   ? Nsaids Other (See Comments)     ?Azotemia         Review of Systems  Review of Systems   All other systems reviewed and are negative.        Objective:        VITAL SIGNS (all recorded)      Clinic Vitals       03/16/16 1315             Amb Encounter Vitals    Weight 85.1 kg (187 lb 11.2 oz)

## 2016-03-16 NOTE — Progress Notes
Continuation of current medications listed above,the importance of follow up visits.    Risk verses benefits of medication and treatments were discussed.  Patient voiced understanding.      Rev notes   rev labs   rf meds   cont current meds   fu specialties   pneumovax  Fu 1 monthy  Orders Placed This Encounter   Medications   ? HYDROcodone-acetaminophen (NORCO) 5-325 MG Tablet     Sig: Take 1 tablet by mouth every 6 hours as needed for pain. Earliest Fill Date: 03/16/16     Dispense:  120 tablet     Refill:  0     No orders of the following type(s) were placed in this encounter: Procedures      Health Maintenance was reviewed. The patient's HM Topic list was:                                            Health Maintenance   Topic Date Due   ? Pneumovax / Prevnar (2 of 2 - PPSV23) 01/21/2016   ? Hemoglobin A1C  02/10/2016   ? USPSTF Hepatitis C Screening  05/29/2016 (Originally Nov 25, 1945)   ? Preventive Wellness Visit  10/26/2016 (Originally 05/10/2014)   ? Influenza Vaccine (1) 03/26/2016   ? Diabetic Foot Exam  03/31/2016   ? Diabetic Eye Exam  04/27/2016   ? Urine Protein  08/03/2016   ? Lipid Profile  11/10/2016   ? Creatinine  11/17/2016   ? Basic Metabolic Panel  11/17/2016   ? Colonoscopy  11/08/2022   ? DTaP,Tdap,and Td Vaccines (2 - Td) 01/17/2024   ? Zoster Vaccine  Excluded           HM    PSA1/16  PROSTATE  COLONOSC    ADV DIRY    FLU10/16  PRENV 6/16  PNEUMOV 8/17  ZOSTAV 6/16r    CBC5/18  CMP  LIPIDS  TSH  B12 1/16  VIT D  URINE DIP    HBA1C 5/16  FEET 5/16 podiatry fu  OPHTH10/16r  URINE MICROALB     EKG 4/16  ECHO 2016  STRESS TEST p    SEG DOPPLER 8/16    CARDIO FU  ENDOCRINOL FU DR CHEHADE -  PAIN MNG FU  NUTRITIONIST 8/16R    PAIN CONTRACT  URINE DOA

## 2016-03-16 NOTE — Progress Notes
-  Gary at 03/16/16 1315       Height 1.778 m (5\' 10" )    -Gary at 03/16/16 1315       BMI (Calculated) 26.99    -Gary at 03/16/16 1315       BSA (Calculated - sq m) 2.05    -Gary at 03/16/16 1315       BP 108/58    -Gary at 03/16/16 1315       BP Location Left upper arm    -Gary at 03/16/16 1315       Position Sitting    -Gary at 03/16/16 1315       Pulse 66    -Gary at 03/16/16 1315       Pulse Source Monitor;Brachial    -Gary at 03/16/16 1315       Pulse Quality Normal    -Gary at 03/16/16 1315       Resp 14    -Gary at 03/16/16 1315       Respiration Quality Normal    -Gary at 03/16/16 1315       Temp 35.9 ?C (96.6 ?F)    -Gary at 03/16/16 1315       Temperature Source Oral    -Gary at 03/16/16 1315       Pain Score Three    -Gary at 03/16/16 1315         User Key  (r) = Recorded By, (t) = Taken By, (c) = Cosigned By    Initials Name Effective Dates    Debby FreibergJJ Walters, Jennifer, MA 01/20/16 -         Physical Exam   Constitutional: He appears well-developed and well-nourished. No distress.   HENT:   Head: Normocephalic.   Eyes: Pupils are equal, round, and reactive to light.   Cardiovascular: Normal rate, regular rhythm and normal heart sounds.  Exam reveals no gallop and no friction rub.    No murmur heard.  Pulmonary/Chest: Effort normal and breath sounds normal. No respiratory distress. He has no wheezes. He has no rales.   Abdominal: Soft. There is no tenderness.       Assessment:       ICD-10-CM ICD-9-CM    1. BMI 26.0-26.9,adult Z68.26 V85.22    2. Cervicalgia M54.2 723.1 HYDROcodone-acetaminophen (NORCO) 5-325 MG Tablet   3. Coronary artery disease involving coronary bypass graft of native heart without angina pectoris I25.810 414.05    4. Cervical spinal stenosis M48.02 723.0    5. CKD (chronic kidney disease), stage 3 (moderate) N18.3 585.3    6. Type 2 diabetes mellitus without complication, without long-term current use of insulin E11.9 250.00           Plan:   Patient advised to various Tx options and agreed to the following:

## 2016-03-16 NOTE — Progress Notes
Subjective:   Gary Walters is a 70 y.o. male being seen today for Medications Refill       HPI Patient is here for follow up of chronic medical conditions and is currently using the medications listed below.  The current medical conditions are mostly stable.     Please see the Review of symtoms below.   fu neck pain cad cri dm   All cond stble no cp sob abp a in  Needs rf meds   missing pneumovax      Past Medical History:   Diagnosis Date   ? CAD (coronary artery disease)    ? Cancer    ? CKD (chronic kidney disease) 08/20/2014   ? Diabetes mellitus    ? Hypertension      Past Surgical History:   Procedure Laterality Date   ? CARDIAC CATHERIZATION     ? CERVICAL FUSION     ? CORONARY ANGIOPLASTY     ? CORONARY ARTERY BYPASS GRAFT       Family History   Problem Relation Age of Onset   ? Cancer Mother    ? Diabetes Father    ? Blindness Father    ? Cancer Sister    ? Cancer Brother      Social History     Social History   ? Marital status: Married     Spouse name: N/A   ? Number of children: N/A   ? Years of education: N/A     Occupational History   ? Not on file.     Social History Main Topics   ? Smoking status: Never Smoker   ? Smokeless tobacco: Never Used   ? Alcohol use No   ? Drug use: No   ? Sexual activity: Yes     Partners: Female     Other Topics Concern   ? Not on file     Social History Narrative     Current Outpatient Prescriptions on File Prior to Visit   Medication Sig   ? amLODIPine (NORVASC) 10 MG Tablet Take 5 mg (1/2 tablet) by mouth daily.   ? aspirin 81 MG tablet Take 1 Tablet by mouth daily.   ? atorvastatin (LIPITOR) 40 MG Tablet Take 1 tablet by mouth daily.   ? cephALEXin (KEFLEX) 500 MG Capsule Take 1 capsule by mouth 3 times daily.   ? cetirizine (ZyrTEC) 10 MG Tablet Take 5 mg (1/2 tablet) by mouth daily.   ? clopidogrel (PLAVIX) 75 MG Tablet Take 1 tablet by mouth daily.   ? gabapentin (NEURONTIN) 300 MG Capsule Take 1 capsule by mouth 2 times daily.

## 2016-03-25 ENCOUNTER — Ambulatory Visit: Attending: Otolaryngology | Primary: Family Medicine

## 2016-03-25 DIAGNOSIS — J309 Allergic rhinitis, unspecified: Secondary | ICD-10-CM

## 2016-03-25 DIAGNOSIS — I2510 Atherosclerotic heart disease of native coronary artery without angina pectoris: Secondary | ICD-10-CM

## 2016-03-25 DIAGNOSIS — I10 Essential (primary) hypertension: Secondary | ICD-10-CM

## 2016-03-25 DIAGNOSIS — J342 Deviated nasal septum: Secondary | ICD-10-CM

## 2016-03-25 DIAGNOSIS — E119 Type 2 diabetes mellitus without complications: Principal | ICD-10-CM

## 2016-03-25 DIAGNOSIS — C801 Malignant (primary) neoplasm, unspecified: Secondary | ICD-10-CM

## 2016-03-25 DIAGNOSIS — N189 Chronic kidney disease, unspecified: Secondary | ICD-10-CM

## 2016-03-25 DIAGNOSIS — E1142 Type 2 diabetes mellitus with diabetic polyneuropathy: Principal | ICD-10-CM

## 2016-03-25 MED ORDER — CETIRIZINE HCL 10 MG PO TABS
10 mg | Freq: Every day | ORAL | 5 refills | Status: CP
Start: 2016-03-25 — End: 2016-03-31

## 2016-03-25 NOTE — Progress Notes
NEUROLOGIC:  No gross CN deficits. No nystagmus noted.  Romberg  normal. Gait normal. Finger to nose normal.  RESPIRATORY: No Stridor or labored breathing.  CARDIOVASCULAR:  Equal and symmetric pulses in all extremeties  LYMPHATIC:  No enlarged nodes palpable in the neck , normal thyroid.              ASSESSMENT & PLAN:        ICD-10-CM ICD-9-CM    1. Allergic rhinitis J30.9 477.9    2. Nasal septal deviation J34.2 470      Ongoing rhinorrhea. ? Chemical sensitivity/food allergy. Some improvement zyrtec. Concerned that would like allergy evaluation. Will put in referral.

## 2016-03-25 NOTE — Progress Notes
hearing loss, mouth sores, nosebleeds, sneezing, sore throat, tinnitus, trouble swallowing and voice change.    Eyes: Negative for itching and visual disturbance.   Respiratory: Negative for apnea, cough, choking, shortness of breath, wheezing and stridor.    Cardiovascular: Negative for chest pain and palpitations.   Gastrointestinal: Negative for abdominal pain, nausea and vomiting.   Skin: Negative for color change and rash.   Allergic/Immunologic: Negative for environmental allergies.   Neurological: Negative for dizziness, syncope, facial asymmetry, speech difficulty, weakness, numbness and headaches.   Hematological: Negative for adenopathy. Does not bruise/bleed easily.       Vitals:    03/25/16 1316   BP: 104/64   Pulse: 60   Resp: 16   Temp: 36.4 ?C (97.6 ?F)   Weight: 87.1 kg (192 lb)   Height: 1.778 m (5\' 10" )       Physical Exam:      Vitals noted above.    APPEARANCE: Well developed, well nourished, in no acute distress.  Normal mood & affect.  COMMUNICATION: Normal voice    HEAD & FACE:  No scars, lesions or masses of head and face.  Sinuses nontender to palpation.  Salivary glands without mass or tenderness.  Facial strength symmetric.  No facial lesion, scars, or mass.  EYES: EOMI. Visual acuity grossly intact.  PERRLA  EXTERNAL EAR & NOSE: No scars, lesions or masses   EAC & TYMPANIC MEMBRANE:  EAC shows no obstructing lesions or debris and tympanic membranes are normal bilaterally with good movement to insufflation.  GROSS HEARING: Normal      TMJ:  Nontender   INTRANASAL EXAM: Anterior rhinoscopic exam reveals normal mucosa, no polyps or obvious mass. . Septum is deviated to left. Bilateral ITH     LIPS, TEETH & GUMS: No lip lesions and normal gums. normal dentition  ORAL CAVITY/OROPHARYNX:  Oral mucosa moist without lesion or asymmetry of the palate, tongue, tonsil or posterior pharynx.     NECK:  Supple without adenopathy or mass.

## 2016-03-25 NOTE — Progress Notes
Review of Systems   Constitutional: Negative.    HENT: Positive for congestion and hearing loss. Negative for ear discharge, ear pain, nosebleeds, sinus pain, sore throat and tinnitus.    Eyes: Negative.    Respiratory: Positive for cough. Negative for hemoptysis, sputum production, shortness of breath, wheezing and stridor.    Cardiovascular: Negative.    Gastrointestinal: Negative.    Genitourinary: Negative.    Musculoskeletal: Positive for back pain, falls and joint pain. Negative for myalgias and neck pain.   Skin: Negative.    Neurological: Negative.    Endo/Heme/Allergies: Negative.    Psychiatric/Behavioral: Negative.

## 2016-03-25 NOTE — Progress Notes
03/25/16  1:39 PM    Referring Provider: Gearlean AlfHadzic, [name]    Chief Complaint:   Chief Complaint   Patient presents with   ? Follow-up       History of Present Illness: 70 y.o. year old Not Hispanic or Latino male presents to ENT clinic as  a established Patient. The patient presents with f/u 10/09/15 with nasal congestive issues thought be side effect of pain medicine and HPTN meds. Still with anterior rhinorrhea associated with foods. Zyrtec seems to help.      Past Medical History:  Past Medical History:   Diagnosis Date   ? CAD (coronary artery disease)    ? Cancer    ? CKD (chronic kidney disease) 08/20/2014   ? Diabetes mellitus    ? Hypertension        Past Surgical History:  Past Surgical History:   Procedure Laterality Date   ? CARDIAC CATHERIZATION     ? CERVICAL FUSION     ? CORONARY ANGIOPLASTY     ? CORONARY ARTERY BYPASS GRAFT         Allergies:  Metformin and Nsaids    Medication:  Outpatient Encounter Prescriptions as of 03/25/2016   Medication Sig Dispense Refill   ? amLODIPine (NORVASC) 10 MG Tablet Take 5 mg (1/2 tablet) by mouth daily. 90 tablet 0   ? aspirin 81 MG tablet Take 1 Tablet by mouth daily. 30 Tablet 0   ? atorvastatin (LIPITOR) 40 MG Tablet Take 1 tablet by mouth daily. 90 tablet 3   ? cephALEXin (KEFLEX) 500 MG Capsule Take 1 capsule by mouth 3 times daily. 21 capsule 0   ? cetirizine (ZyrTEC) 10 MG Tablet Take 5 mg (1/2 tablet) by mouth daily. 90 tablet 1   ? clopidogrel (PLAVIX) 75 MG Tablet Take 1 tablet by mouth daily. 90 tablet 0   ? gabapentin (NEURONTIN) 300 MG Capsule Take 1 capsule by mouth 2 times daily. 60 capsule 3   ? glucose blood Strip 1 strip as instructed 5 times daily. One Touch  DX: E11.40  Test 3-5 times daily 150 Each 11   ? HYDROcodone-acetaminophen (NORCO) 5-325 MG Tablet Take 1 tablet by mouth every 6 hours as needed for pain. Earliest Fill Date: 03/16/16 120 tablet 0   ? insulin aspart (NovoLOG) vial 100 units daily via insulin pump 9 vial 4

## 2016-03-25 NOTE — Progress Notes
?   insulin glargine (LANTUS SOLOSTAR) 100 UNIT/ML Solution Pen-injector Inject 25 Units into the skin nightly at bedtime. 15 mL 6   ? ipratropium (ATROVENT) 0.06 % Solution Spray 1 spray in each nostril 4 times daily. 15 mL 1   ? losartan (COZAAR) 100 MG Tablet Take 1 Tablet by mouth daily. 1/2-1 pill a day (Patient taking differently: Take 50 mg by mouth daily. (1/2 tablet)) 90 Tablet 3   ? losartan-hydroCHLOROthiazide (HYZAAR) 100-25 MG Tablet Take 50/12.5 mg (1/2 tablet) by mouth 2 times daily. 90 tablet 0   ? metoprolol succinate (TOPROL-XL) 25 MG Tablet Extended Release 24 Hour Take 1 tablet by mouth nightly at bedtime. 90 tablet 1   ? metoprolol tartrate (LOPRESSOR) 100 MG Tablet Take 50 mg by mouth 2 times daily.     ? omeprazole (PriLOSEC) 40 MG Capsule Delayed Release Take 1 capsule by mouth daily. 90 capsule 3   ? ONE TOUCH ULTRA 2 W/DEVICE Kit Use as instructed. 1 kit 1   ? ONE TOUCH ULTRA TEST Strip USE ONE STRIP TO CHECK GLUCOSE THREE TO FIVE TIMES DAILY BEFORE MEAL(S) AS INSTRUCTED 50 strip 0   ? ONE TOUCH ULTRA TEST Strip USE 1 STRIP TO CHECK GLUCOSE 3 TO 5 TIMES DAILY BEFORE MEAL(S) AS INSTRUCTED 50 strip 0   ? sildenafil (VIAGRA) 100 MG tablet Take 1 Tablet by mouth as needed for erectile dysfunction. 5 Tablet 3     No facility-administered encounter medications on file as of 03/25/2016.        Social History   Substance Use Topics   ? Smoking status: Never Smoker   ? Smokeless tobacco: Never Used   ? Alcohol use No       Family History   Problem Relation Age of Onset   ? Cancer Mother    ? Diabetes Father    ? Blindness Father    ? Cancer Sister    ? Cancer Brother        Review of Systems:    Review of Systems   Constitutional: Negative for activity change, chills, fatigue, fever and unexpected weight change.   HENT: Positive for congestion, postnasal drip, rhinorrhea, sinus pain and sinus pressure. Negative for ear discharge, ear pain, facial swelling,

## 2016-03-26 MED ORDER — ONETOUCH ULTRA BLUE VI STRP
ORAL_STRIP | 0 refills | Status: CP
Start: 2016-03-26 — End: 2016-03-31

## 2016-03-30 DIAGNOSIS — E119 Type 2 diabetes mellitus without complications: Principal | ICD-10-CM

## 2016-03-31 ENCOUNTER — Ambulatory Visit: Attending: "Endocrinology | Primary: Family Medicine

## 2016-03-31 ENCOUNTER — Ambulatory Visit: Attending: Cardiovascular Disease | Primary: Family Medicine

## 2016-03-31 DIAGNOSIS — I2510 Atherosclerotic heart disease of native coronary artery without angina pectoris: Secondary | ICD-10-CM

## 2016-03-31 DIAGNOSIS — Z794 Long term (current) use of insulin: Secondary | ICD-10-CM

## 2016-03-31 DIAGNOSIS — I10 Essential (primary) hypertension: Secondary | ICD-10-CM

## 2016-03-31 DIAGNOSIS — E785 Hyperlipidemia, unspecified: Secondary | ICD-10-CM

## 2016-03-31 DIAGNOSIS — E1142 Type 2 diabetes mellitus with diabetic polyneuropathy: Principal | ICD-10-CM

## 2016-03-31 DIAGNOSIS — C801 Malignant (primary) neoplasm, unspecified: Secondary | ICD-10-CM

## 2016-03-31 DIAGNOSIS — E119 Type 2 diabetes mellitus without complications: Principal | ICD-10-CM

## 2016-03-31 DIAGNOSIS — N189 Chronic kidney disease, unspecified: Secondary | ICD-10-CM

## 2016-03-31 DIAGNOSIS — I5189 Other ill-defined heart diseases: Secondary | ICD-10-CM

## 2016-03-31 DIAGNOSIS — I25810 Atherosclerosis of coronary artery bypass graft(s) without angina pectoris: Secondary | ICD-10-CM

## 2016-03-31 DIAGNOSIS — R0789 Other chest pain: Principal | ICD-10-CM

## 2016-03-31 MED ORDER — GLUCOSE BLOOD VI STRP
ORAL_STRIP | 11 refills | Status: CP
Start: 2016-03-31 — End: 2016-05-26

## 2016-03-31 NOTE — Patient Instructions
-   If needed, can go to [facility] to get over the counter ReliOn R - it is the CLEAR insulin   - Continue the same insulin pump settings  - Get blood work done now

## 2016-03-31 NOTE — Patient Instructions
-   If needed, can go to [facility] to get over the counter ReliO

## 2016-04-02 ENCOUNTER — Ambulatory Visit: Attending: Podiatrist | Primary: Family Medicine

## 2016-04-02 DIAGNOSIS — E1149 Type 2 diabetes mellitus with other diabetic neurological complication: Secondary | ICD-10-CM

## 2016-04-02 DIAGNOSIS — M205X9 Other deformities of toe(s) (acquired), unspecified foot: Secondary | ICD-10-CM

## 2016-04-02 DIAGNOSIS — E1140 Type 2 diabetes mellitus with diabetic neuropathy, unspecified: Principal | ICD-10-CM

## 2016-04-02 DIAGNOSIS — Z794 Long term (current) use of insulin: Secondary | ICD-10-CM

## 2016-04-02 DIAGNOSIS — E119 Type 2 diabetes mellitus without complications: Secondary | ICD-10-CM

## 2016-04-02 NOTE — Progress Notes
?   Diabetes mellitus   ? BPH (benign prostatic hyperplasia)   ? Esophageal dysmotility   ? History of noncompliance   ? Unspecified cataract   ? Chronic headaches   ? Vitamin B12 deficiency   ? GERD (gastroesophageal reflux disease)   ? Labile essential hypertension   ? Dyslipidemia   ? Erectile dysfunction   ? History of cutaneous melanoma   ? Status post cervical spinal fusion   ? Hammer toe   ? Plantar fibromatosis   ? Osteoarthritis of the knees   ? CAD (coronary artery disease)   ? History of 3V CABG in 2015   ? History of myocardial infarction in 2015   ? CKD (chronic kidney disease)   ? Cervical spinal stenosis   ? History of shingles   ? Status post insulin pump placement   ? DOE (dyspnea on exertion)   ? Right rotator cuff tendinopathy   ? Biceps tendinitis of the right upper extremity   ? Allergic rhinitis   ? Diastolic dysfunction       PMH:  Past Medical History:   Diagnosis Date   ? CAD (coronary artery disease)    ? Cancer    ? CKD (chronic kidney disease) 08/20/2014   ? Diabetes mellitus    ? Hypertension        PSH:  Past Surgical History:   Procedure Laterality Date   ? CARDIAC CATHERIZATION     ? CERVICAL FUSION     ? CORONARY ANGIOPLASTY     ? CORONARY ARTERY BYPASS GRAFT         Social Hx:  Social History     Social History   ? Marital status: Married     Spouse name: N/A   ? Number of children: N/A   ? Years of education: N/A     Occupational History   ? Not on file.     Social History Main Topics   ? Smoking status: Never Smoker   ? Smokeless tobacco: Never Used   ? Alcohol use No   ? Drug use: No   ? Sexual activity: Yes     Partners: Female     Other Topics Concern   ? Not on file     Social History Narrative       Family Hx:  Family History   Problem Relation Age of Onset   ? Cancer Mother    ? Diabetes Father    ? Blindness Father    ? Cancer Sister    ? Cancer Brother        ROS:   Constitutional: denies fever, chills, or night sweats  HENT: denies headaches or changes in hearing

## 2016-04-02 NOTE — Progress Notes
Department of Orthopedics   Established Patient Visit  Division Of Podiatric Medicine and Surgery         Assessment:     ICD-10-CM ICD-9-CM   1. Diabetic neuropathy with neurologic complication E11.40 250.60    E11.49 357.2   2. Type 2 diabetes mellitus with diabetic neuropathy, with long-term current use of insulin E11.40 250.60    Z79.4 357.2     V58.67   3. Comprehensive diabetic foot examination, type 2 DM, encounter for E11.9 250.00   4. Toe contracture, unspecified laterality M20.5X9 735.8       ADA Classification:   0 = No loss of protective sensation    Plan:  1) Pt seen and evaluated   2) Rx for Dm shoes   3) Discussed Dm foot care with the patient   4) RTC in 4 months     RTC 1 week for wound check    No orders of the following type(s) were placed in this encounter: Procedures    No orders of the following type(s) were placed in this encounter: Medications.       Referring Physician/Primary Care Physician:  Gearlean AlfHadzic, [name], MD    Chief Complaint/Reason for Return:  No chief complaint on file.        HPI:  [age] y.o. male presents to clinic fodiabetic foot evaluation and new DM shoes. He had an issue with his last pair of DM shoes that caused irritation and ulceration.  He would like a new pair today. Denies any n/v/f/c/sob or cp.      Results for Brantley FlingCOKER, Khaliq (MRN [id number]) as of 12/04/2015 08:26   Ref. Range 11/11/2015 10:55   HGB A1C % Latest Ref Range: <5.7 % of total Hgb 8.2 (H)       Allergies:  Allergies   Allergen Reactions   ? Metformin Other (See Comments)     Unspecified   ? Nsaids Other (See Comments)     ?Azotemia       Current Meds:  Outpatient Medications Prior to Visit   Medication Sig   ? amLODIPine (NORVASC) 10 MG Tablet Take 5 mg (1/2 tablet) by mouth daily.   ? aspirin 81 MG tablet Take 1 Tablet by mouth daily.   ? atorvastatin (LIPITOR) 40 MG Tablet Take 1 tablet by mouth daily.   ? cetirizine (ZyrTEC) 10 MG Tablet Take 5 mg (1/2 tablet) by mouth daily.

## 2016-04-02 NOTE — Progress Notes
?   clopidogrel (PLAVIX) 75 MG Tablet Take 1 tablet by mouth daily.   ? gabapentin (NEURONTIN) 300 MG Capsule Take 1 capsule by mouth 2 times daily.   ? glucose blood (ONE TOUCH ULTRA TEST) Strip USE 1 STRIP TO CHECK GLUCOSE 3 TO 5 TIMES DAILY BEFORE MEALS AS INSTRUCTED   ? glucose blood Strip 1 strip as instructed 5 times daily. One Touch  DX: E11.40  Test 3-5 times daily   ? HYDROcodone-acetaminophen (NORCO) 5-325 MG Tablet Take 1 tablet by mouth every 6 hours as needed for pain. Earliest Fill Date: 03/16/16   ? insulin aspart (NovoLOG) vial 100 units daily via insulin pump   ? insulin glargine (LANTUS SOLOSTAR) 100 UNIT/ML Solution Pen-injector Inject 25 Units into the skin nightly at bedtime.   ? ipratropium (ATROVENT) 0.06 % Solution Spray 1 spray in each nostril 4 times daily.   ? losartan (COZAAR) 100 MG Tablet Take 1 Tablet by mouth daily. 1/2-1 pill a day (Patient taking differently: Take 50 mg by mouth daily. (1/2 tablet) Reasons: Pt taking half in the AM in and half in the PM)   ? losartan-hydroCHLOROthiazide (HYZAAR) 100-25 MG Tablet Take 50/12.5 mg (1/2 tablet) by mouth 2 times daily.   ? metoprolol succinate (TOPROL-XL) 25 MG Tablet Extended Release 24 Hour Take 1 tablet by mouth nightly at bedtime. (Patient taking differently: Take 25 mg by mouth nightly at bedtime.  Reasons: Pt takes half of prescribed 25 mg two times daily)   ? metoprolol tartrate (LOPRESSOR) 100 MG Tablet Take 50 mg by mouth 2 times daily.   ? omeprazole (PriLOSEC) 40 MG Capsule Delayed Release Take 1 capsule by mouth daily.   ? ONE TOUCH ULTRA 2 W/DEVICE Kit Use as instructed.   ? ONE TOUCH ULTRA TEST Strip USE ONE STRIP TO CHECK GLUCOSE THREE TO FIVE TIMES DAILY BEFORE MEAL(S) AS INSTRUCTED   ? sildenafil (VIAGRA) 100 MG tablet Take 1 Tablet by mouth as needed for erectile dysfunction.     Last reviewed on 04/02/2016  1:06 PM by [name], DPM    Active Problems:  Patient Active Problem List   Diagnosis

## 2016-04-02 NOTE — Progress Notes
Eyes: denies visual disturbance  Respiratory: denies cough, shortness of breath  Cardiovascular: denies chest pain or palpitations  GI: denies nausea, vomiting, diarrhea, constipation, anorexia, and weight loss or gain  Neurological: denies numbness, weakness,   Endocrine: denies malaise, sleepiness, hot flashes  Hematologic/Lymphatic: denies fatigue, lumps or bumps anywhere, groin or armpit lumps, edema  GU: denies dysuria, hematuria, and increase or decrease in urination  Integumentary: denies rashes or other lesions  Psychiatric: denies changes in mood or affect        Physical Exam:  There were no vitals taken for this visit.    General: Oriented x 3 in NAD. Well nourished    Podiatric Specific    Vascular: DP pulse 2/4 and PT pulse 1/4 Left. Capillary refill time less than 3 seconds to all digits. Feet temperature WNL.     Neuro: Epicritic sense of light touch intact  bilat to tibial, deep peroneal, superficial peroneal, sural, and saphenous distributions.    Derm: Skin is warm, dry and supple. Superficial abrasion to dorsal R hallux with 2cm's surrounding erythema, mild warmth, no purulent drainage.     Musculoskeletal: no history of amputation. Hammertoes 2-5 bilat. Muscle strength 5/5 Bilateral. Active EHL, FHL, TA, GSC bilat. Neg for pain on palpation or ROM. Pain on palption about R great toe.

## 2016-04-08 NOTE — Progress Notes
Basal rate 0.8  midnight till 7 am   Basal 1   Carb ratio 1/15    1 for ecvery 50 > 100   Need to bolus more     Blood work now - BMP and HbA1c     If difficult with cost of insulin - can get over the counter insulin at [facility] - discussed with patient.     Orders Placed This Encounter   Procedures   ? Hemoglobin A1c   ? Basic Metabolic Panel     Medication orders placed this encounter   Medications   ? glucose blood (ONE TOUCH ULTRA TEST) Strip     Sig: USE 1 STRIP TO CHECK GLUCOSE 3 TO 5 TIMES DAILY BEFORE MEALS AS INSTRUCTED     Dispense:  50 strip     Refill:  11      Hypertension   Taking Metoprolol tartrate 50mg  BID   Taking losartan 50 mg daily  norvac 5 bid - states that he taking only a "bite" of the dose, not twice a day  Has follow up with cardiology regarding reducing BP medications.     F/u 3 months

## 2016-04-08 NOTE — Progress Notes
no  fever chills or night sweat. Energy level good for stated age.  Eyes: Denies any vision problem.  Cardiovascular:  Denies any acute chest pain at rest or with exertion.  No lower extremities edema. No shortness of breath with minimal or moderate exertion.  Respiratory:  No  coughing hemoptysis or wheezing.  Gastrointestinal: No history of nausea, vomiting,  yes heartburn,  no diarrhea, constipation, or abdominal pain. + gerd   Neurological: No  memory problem +   feet numbness or burning sensation. Musculoskeletal: No muscles aching or joints aching.  Psychiatric:  No sign of depression, no  irritability or nervousness.  Endocrine: (as reviewed in HPI).  No heat or cold intolerance.  Genitourinary:  No dysuria or hematuria + nocturia   Hematologic:  No easy bruising.    Vital Signs     Vitals:    03/31/16 1321   BP: 130/62   Pulse: 60   Resp: 18   Weight: 86.2 kg (190 lb)   Height: 1.778 m (5\' 10" )     Physical Exam   Constitution:   1-Vital sign as reviewed in the chart   2-Appearance: Well nourished, Normocephalic.   Cardiovascular:   1-Heart auscultation: regular rhythm no appreciate murmur.   3-Lower extremities: no edema   Respiratory:   1-Respiratory effort: No labored breathing.   2-Auscultation: lungs are clear to auscultation bilaterally;no wheezing, rhonchi or rales.   Abdomen   soft, non tender, non distended   Musculosketetal:   1-Gait & Station: Normal gait and station, gait is smooth and coordinated.   Psychiatric:   1-Orientation: Patient appears well oriented to place, person and time.   2-Mood & affect: Patient appears in normal mood & affect, no agitation, anxiety or blunt affect.    Monofilament test wnl in both feet at previous visit   Sensation intact now     Assessment   1-DM type 2, uncontrolled  2-Hypertension  3-Hyperlipidemia  4 neuropathy.    Plan   Downloaded pump bolusing better, but still missing boluses and not always consistent with checking glucose     Cont

## 2016-04-08 NOTE — Progress Notes
gabapentin (NEURONTIN) 300 MG Capsule Take 1 capsule by mouth 2 times daily. 01/23/16  Yes [name], [name], MD   glucose blood Strip 1 strip as instructed 5 times daily. One Touch  DX: E11.40  Test 3-5 times daily 09/25/14  Yes Chehade, Joyce Copa, MD   HYDROcodone-acetaminophen (NORCO) 5-325 MG Tablet Take 1 tablet by mouth every 6 hours as needed for pain. Earliest Fill Date: 03/16/16 03/16/16  Yes Delton Coombes, MD   insulin aspart (NovoLOG) vial 100 units daily via insulin pump 11/14/15  Yes Chehade, Joyce Copa, MD   insulin glargine (LANTUS SOLOSTAR) 100 UNIT/ML Solution Pen-injector Inject 25 Units into the skin nightly at bedtime. 04/30/15  Yes Rosemary Holms, MD   ipratropium (ATROVENT) 0.06 % Solution Spray 1 spray in each nostril 4 times daily. 09/29/15  Yes Darrol Poke, MD   losartan (COZAAR) 100 MG Tablet Take 1 Tablet by mouth daily. 1/2-1 pill a day  Patient taking differently: Take 50 mg by mouth daily. (1/2 tablet) 04/28/15  Yes [name], [name], MD   losartan-hydroCHLOROthiazide (HYZAAR) 100-25 MG Tablet Take 50/12.5 mg (1/2 tablet) by mouth 2 times daily. 02/23/16  Yes Mat Carne, MD   metoprolol succinate (TOPROL-XL) 25 MG Tablet Extended Release 24 Hour Take 1 tablet by mouth nightly at bedtime. 12/25/15  Yes [name], [name], MD   metoprolol tartrate (LOPRESSOR) 100 MG Tablet Take 50 mg by mouth 2 times daily.   Yes Information, Historical   omeprazole (PriLOSEC) 40 MG Capsule Delayed Release Take 1 capsule by mouth daily. 10/04/14  Yes Minna Antis, ARNP   ONE TOUCH ULTRA 2 W/DEVICE Kit Use as instructed. 09/27/14  Yes Chehade, Joyce Copa, MD   ONE TOUCH ULTRA TEST Strip USE ONE STRIP TO CHECK GLUCOSE THREE TO FIVE TIMES DAILY BEFORE MEAL(S) AS INSTRUCTED 10/21/15  Yes [name], [name], MD   ONE TOUCH ULTRA TEST Strip USE 1 STRIP TO CHECK GLUCOSE 3 TO 5 TIMES DAILY BEFORE MEALS AS INSTRUCTED 03/26/16  Yes [name], [name], MD   sildenafil (VIAGRA) 100 MG tablet Take 1 Tablet by mouth as needed for

## 2016-04-08 NOTE — Progress Notes
Basal rate 0.8  midnight till 7 am   Basal 1   Carb ratio 1/15    1 for ecvery 50 > 100   Need to bolus more     Blood work now - BMP and HbA1c     If difficult with cost of insulin - can get over the counter insulin at [facility] - discussed with patient.     Orders Placed This Encounter   Procedures   ? Hemoglobin A1c   ? Basic Metabolic Panel     Medication orders placed this encounter   Medications   ? glucose blood (ONE TOUCH ULTRA TEST) Strip     Sig: USE 1 STRIP TO CHECK GLUCOSE 3 TO 5 TIMES DAILY BEFORE MEALS AS INSTRUCTED     Dispense:  50 strip     Refill:  11      Hypertension   Taking Metoprolol tartrate 50mg  BID   Taking losartan 50 mg daily  norvac 5 bid - states that he taking only a "bite" of the dose, not twice a day  Has follow up with cardiology regarding reducing BP medications.     F/u 3 months          I saw and examined the patient. I agree with the Fellow's assessment and plan as documented with the following addition/exception: none

## 2016-04-08 NOTE — Progress Notes
Chief Complaint   ? [name] is a 70  year old male here today for follow-up on type 2  diabetes  HPI     Type 2 DM    insulin regimen :  Basal rate 0.9 midnight till 7 am   1 u /h 7am - MN   1/15 units ratio with each meal    1 units for every 50 glucose increase  Off  METFORMIN     Has difficulty getting insulin due to cost, and is getting from a friend now. Occasionally using expired insulin as well.     Accuchecks varies but only checking 2-3 times a week, usually AM and glucose elevated in 180-200s  Reviewed insulin pump download - only for the past week, and not consistent. Avg glucose 208 and one low of 74    Lowest glucose of 51 - 3 weeks ago  Hypoglycemia once every 1-2 mo. with symptoms - lightheadedness, difficulty focusing     Usually does not eat breakfast  Eats 2 meals per day; not always counting carbs     Changing site every 3-4 days     Not had labs done. Due for A1c. Last A1c in April 2017 - 8.2%    Creat  previously 1.37. On ARB    On atorvastatin. LDL 54 in April 2017  Neuropathy - on gabapentin   Due for eye exam   HTN - on norvasc, metoprolol and losartan   .  Current Meds   Current Outpatient Medications    Medication Sig Start Date End Date Taking? Authorizing Provider   amLODIPine (NORVASC) 10 MG Tablet Take 5 mg (1/2 tablet) by mouth daily. 02/23/16  Yes Gary MetroSuryadevara, Siva K, MD   aspirin 81 MG tablet Take 1 Tablet by mouth daily. 06/25/14  Yes Gary Walters, ARNP   atorvastatin (LIPITOR) 40 MG Tablet Take 1 tablet by mouth daily. 06/05/15  Yes [name], [name], MD   cephALEXin (KEFLEX) 500 MG Capsule Take 1 capsule by mouth 3 times daily. 02/02/16  Yes Gary Walters, ARNP   cetirizine (ZyrTEC) 10 MG Tablet Take 5 mg (1/2 tablet) by mouth daily. 10/17/15  Yes Gary MetroSuryadevara, Siva K, MD   cetirizine (ZyrTEC) 10 MG Tablet Take 1 tablet by mouth daily. 03/25/16  Yes Gary AltoFraker, John Temple, MD   clopidogrel (PLAVIX) 75 MG Tablet Take 1 tablet by mouth daily. 02/23/16  Yes Gary MetroSuryadevara, Siva K, MD

## 2016-04-08 NOTE — Progress Notes
erectile dysfunction. 03/21/14  Yes Rosemary Holms, MD     Allergies   No Known Drug Allergies.    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  1-DM type 2, uncontrolled  2-ED  3-Hypertension  4-Hyperlipidemia.    [place]   Neck Surgery.    Family Hx   Paternal history of Diabetes Mellitus  Paternal history of Essential Hypertension  Family history of thyroid cancer?  no  Family history of thyroid disease? no     Office Visit on 11/26/2015   Component Date Value   ? Glucose Random 11/26/2015 159    Office Visit on 11/18/2015   Component Date Value   ? WHITE BLOOD CELL COUNT 11/18/2015 4.4    ? RBC 11/18/2015 3.80*   ? Hemoglobin 11/18/2015 12.7*   ? Hematocrit 11/18/2015 37.2*   ? MCV 11/18/2015 97.9    ? [facility] 11/18/2015 33.4*   ? MCHC 11/18/2015 34.1    ? RDW 11/18/2015 12.4    ? Platelets 11/18/2015 220    ? MPV 11/18/2015 10.8    ? Neutrophils Absolute 11/18/2015 2732    ? Lymphocytes Absolute 11/18/2015 1087    ? Monocytes Absolute 11/18/2015 462    ? Eosinophils Absolute 11/18/2015 79    ? Basophils Absolute 11/18/2015 40    ? Neutrophils 11/18/2015 62.1    ? LYMPHOCYTES 11/18/2015 24.7    ? MONOCYTES 11/18/2015 10.5    ? EOSINOPHILS 11/18/2015 1.8    ? BASOPHILS 11/18/2015 0.9    ? Glucose 11/18/2015 383*   ? Urea Nitrogen 11/18/2015 40*   ? Creatinine 11/18/2015 1.52*   ? EGFR 11/18/2015 46*   ? Glom Filt Rate, Est Afri* 11/18/2015 54*   ? BUN/Creatinine Ratio 11/18/2015 26*   ? Sodium 11/18/2015 137    ? Potassium 11/18/2015 5.2    ? Chloride 11/18/2015 106    ? CARBON DIOXIDE 11/18/2015 21    ? Calcium 11/18/2015 8.6    ? Protein, Total 11/18/2015 6.1    ? ALBUMIN 11/18/2015 3.7    ? Globulin 11/18/2015 2.4    ? ALBUMIN/GLOBULIN RATIO 11/18/2015 1.5    ? Total Bilirubin 11/18/2015 0.4    ? Alkaline Phosphatase 11/18/2015 73    ? AST 11/18/2015 18    ? ALT 11/18/2015 17        Personal Hx   Denied Alcohol Use  Never A Smoker    ROS   Constitution: weight stable

## 2016-04-18 DIAGNOSIS — I2510 Atherosclerotic heart disease of native coronary artery without angina pectoris: Secondary | ICD-10-CM

## 2016-04-18 DIAGNOSIS — I10 Essential (primary) hypertension: Secondary | ICD-10-CM

## 2016-04-18 DIAGNOSIS — N189 Chronic kidney disease, unspecified: Secondary | ICD-10-CM

## 2016-04-18 DIAGNOSIS — E119 Type 2 diabetes mellitus without complications: Principal | ICD-10-CM

## 2016-04-18 DIAGNOSIS — C801 Malignant (primary) neoplasm, unspecified: Secondary | ICD-10-CM

## 2016-04-18 MED ORDER — AMLODIPINE BESYLATE 10 MG PO TABS: Start: 2016-04-18 — End: 2016-08-16

## 2016-04-19 NOTE — Progress Notes
significant abnormalities.   2. TTE performed in February 2017 revealed normal LV size and systolic function (LVEF of 60-65%), diastolic dysfunction with elevated LV filling pressure, and no evidence of significant valvular heart disease or PH.  3. MPS performed in February 2017 revealed a fixed apical defect.   4. LHC performed in November 2016 revealed 30% distal LMCA stenosis, diffuse proximal and mid LAD disease with up to 80% stenosis, 90% ostial D1 stenosis, 95% proximal D2 stenosis, 80% proximal LCx stenosis, diffuse mid and distal RCA disease with up to 80% stenosis, a patent LIMA graft to the LAD, a patent SVG to OM1 with 90% stenosis at the anastomosis, a patent SVG to the PDA with 70% distal PDA stenosis, and a LVEDP of 18 mmHg. PCI of the SVG to OM1 and the distal PDA was subsequently performed with implantation of Xience 2.5 x 15 mm and Resolute Integrity 2.25 x 18 mm drug-eluting stents.  5. LE segmental doppler ultrasound performed in September 2016 revealed no evidence of significant peripheral vascular disease.   6. Labs drawn in May 2016: LDL 87, HDL 44, triglycerides 73.  7. Labs drawn in October 2016: Hb 14.9, BUN 25, Cr 1.35, K 4.9, albumin 4, TSH 1.06.  8. Labs drawn in February 2017: Hb 13.3, BUN 15, Cr 1.14, K 3.4.    ASSESSMENT:  1. Chest wall pain  2. CAD without angina at this time  3. Diastolic dysfunction  4. Labile essential hypertension  5. Dyslipidemia     RECOMMENDATIONS:  1. Change AMLODIPINE to 5 mg daily  2. Continue medical therapy otherwise  3. RTC in 6 months plus prn    Woody SellerSIVA SURYADEVARA, MD

## 2016-04-19 NOTE — Progress Notes
[facility] CARDIOLOGY PROGRESS NOTE     NAME: Gary Walters, Gary  DOB: 07-May-1946  MRN: [id number]     DATE OF SERVICE: 03/31/2016     ATTENDING PHYSICIAN: Woody SellerSIVA SURYADEVARA, MD  PRIMARY CARE PHYSICIAN: Gearlean AlfAMRA [name], MD     REASON FOR VISIT: Follow-up of CAD and labile essential hypertension    HPI: The patient is a 70 yo male with a history of CAD, MI, 3V CABG in October 2015 (LIMA graft to the LAD and SVGs to OM1 and the PDA), PCI of the SVG to OM1 and the distal PDA in November 2016, diastolic dysfunction, DM, labile essential hypertension, dyslipidemia, GERD, CKD, BPH, allergic rhinitis, senile cataracts, and chronic pain syndrome. He describes rare episodes of chest wall pain. Additionally, he does not describe fatigue, syncope, lightheadedness, angina, palpitations, uncontrolled hypertension, undue DOE, orthopnea, PND, LE edema, amaurosis fugax, or claudication.     ROS: Positive for chronic pain. All other systems reviewed and negative.     MEDICAL HISTORY: As stated above.     PERTINENT MEDICATIONS:  1. CLOPIDOGREL 75 mg daily  2. METOPROLOL TARTRATE 50 mg bid  3. LOSARTAN/HCTZ 50/12.5 mg bid  4. AMLODIPINE 2.5 mg bid  5. ATORVASTATIN 40 mg daily     ALLERGIES: NO KNOWN DRUG ALLERGIES.     FAMILY HISTORY: Negative for premature CAD and cardiomyopathy.     SOCIAL HISTORY: The patient is a nonsmoker and he does not describe alcohol use.     PE:  VS: T Afebrile, P 54, BP 144/77, R 14, Wt 191 pounds (was 189 pounds on 11/28/2015)  GEN: Well-developed male, no distress  HEENT: Conjunctivae pink and moist, no scleral icterus  NECK: No JVD or thyromegaly, carotid pulses 2+ and with normal upstrokes, no carotid bruits  CVS: Pulse regular in rhythm, S1 and S2 normal, 1/6 early-peaking ESM at the RUSB, no rubs  RESP: Clear lung fields, resonant on percussion  EXT: No cyanosis or edema, warm peripheries, pedal pulses 1+    DIAGNOSTIC DATA:  1. EKG performed on 09/18/2015 revealed sinus bradycardia at 59 bpm and no

## 2016-04-26 ENCOUNTER — Ambulatory Visit: Attending: Family Medicine | Primary: Family Medicine

## 2016-04-26 DIAGNOSIS — N183 Chronic kidney disease, stage 3 (moderate): Secondary | ICD-10-CM

## 2016-04-26 DIAGNOSIS — M4802 Spinal stenosis, cervical region: Secondary | ICD-10-CM

## 2016-04-26 DIAGNOSIS — I10 Essential (primary) hypertension: Secondary | ICD-10-CM

## 2016-04-26 DIAGNOSIS — M542 Cervicalgia: Principal | ICD-10-CM

## 2016-04-26 DIAGNOSIS — I2510 Atherosclerotic heart disease of native coronary artery without angina pectoris: Secondary | ICD-10-CM

## 2016-04-26 DIAGNOSIS — N400 Enlarged prostate without lower urinary tract symptoms: Secondary | ICD-10-CM

## 2016-04-26 DIAGNOSIS — I25810 Atherosclerosis of coronary artery bypass graft(s) without angina pectoris: Secondary | ICD-10-CM

## 2016-04-26 DIAGNOSIS — C801 Malignant (primary) neoplasm, unspecified: Secondary | ICD-10-CM

## 2016-04-26 DIAGNOSIS — E119 Type 2 diabetes mellitus without complications: Principal | ICD-10-CM

## 2016-04-26 DIAGNOSIS — N189 Chronic kidney disease, unspecified: Secondary | ICD-10-CM

## 2016-04-26 DIAGNOSIS — G4700 Insomnia, unspecified: Secondary | ICD-10-CM

## 2016-04-26 DIAGNOSIS — M7521 Bicipital tendinitis, right shoulder: Secondary | ICD-10-CM

## 2016-04-26 MED ORDER — HYDROCODONE-ACETAMINOPHEN 5-325 MG PO TABS
1 | ORAL_TABLET | Freq: Four times a day (QID) | ORAL | 0 refills | Status: CP | PRN
Start: 2016-04-26 — End: 2016-05-26

## 2016-04-26 MED ORDER — CEPHALEXIN 500 MG PO CAPS
500 mg | Freq: Three times a day (TID) | ORAL | 0 refills | Status: CP
Start: 2016-04-26 — End: 2016-06-29

## 2016-04-26 MED ORDER — LORAZEPAM 1 MG PO TABS
0 refills | Status: CP
Start: 2016-04-26 — End: 2016-05-26

## 2016-04-26 NOTE — Progress Notes
?   glucose blood (ONE TOUCH ULTRA TEST) Strip USE 1 STRIP TO CHECK GLUCOSE 3 TO 5 TIMES DAILY BEFORE MEALS AS INSTRUCTED   ? [DISCONTINUED] HYDROcodone-acetaminophen (NORCO) 5-325 MG Tablet Take 1 tablet by mouth every 6 hours as needed for pain. Earliest Fill Date: 03/16/16   ? insulin aspart (NovoLOG) vial 100 units daily via insulin pump   ? insulin glargine (LANTUS SOLOSTAR) 100 UNIT/ML Solution Pen-injector Inject 25 Units into the skin nightly at bedtime.   ? losartan-hydroCHLOROthiazide (HYZAAR) 100-25 MG Tablet Take 50/12.5 mg (1/2 tablet) by mouth 2 times daily.   ? metoprolol tartrate (LOPRESSOR) 100 MG Tablet Take 50 mg by mouth 2 times daily.   ? omeprazole (PriLOSEC) 40 MG Capsule Delayed Release Take 1 capsule by mouth daily.   ? ONE TOUCH ULTRA 2 W/DEVICE Kit Use as instructed.   ? sildenafil (VIAGRA) 100 MG tablet Take 1 Tablet by mouth as needed for erectile dysfunction.     No current facility-administered medications on file prior to visit.      Allergies   Allergen Reactions   ? Metformin Other (See Comments)     Unspecified   ? Nsaids Other (See Comments)     ?Azotemia         Review of Systems  Review of Systems   All other systems reviewed and are negative.          Objective:        VITAL SIGNS (all recorded)      Clinic Vitals       04/26/16 1325             Amb Encounter Vitals    Weight 84.8 kg (187 lb)    -JJ at 04/26/16 1326       Height 1.778 m (5' 10")    -JJ at 04/26/16 1326       BMI (Calculated) 26.89    -JJ at 04/26/16 1326       BSA (Calculated - sq m) 2.05    -JJ at 04/26/16 1326       BP 123/80    -JJ at 04/26/16 1326       BP Location Left upper arm    -JJ at 04/26/16 1326       Position Sitting    -JJ at 04/26/16 1326       Pulse 70    -JJ at 04/26/16 1326       Pulse Source Monitor;Brachial    -JJ at 04/26/16 1326       Pulse Quality Normal    -JJ at 04/26/16 1326       Resp 15    -JJ at 04/26/16 1326       Respiration Quality Normal    -JJ at 04/26/16 1326

## 2016-04-26 NOTE — Progress Notes
Temp 36.7 ?C (98 ?F)    -JJ at 04/26/16 1326       Temperature Source Oral    -JJ at 04/26/16 1326       Pain Score Four    -JJ at 04/26/16 1326         User Key  (r) = Recorded By, (t) = Taken By, (c) = Cosigned By    Initials Name Effective Dates    Debby FreibergJJ Joudi, Jennifer, MA 01/20/16 -         Physical Exam   Constitutional: He appears well-developed and well-nourished. No distress.   HENT:   Head: Normocephalic.   Eyes: Pupils are equal, round, and reactive to light.   Cardiovascular: Normal rate, regular rhythm and normal heart sounds.  Exam reveals no gallop and no friction rub.    No murmur heard.  Pulmonary/Chest: Effort normal and breath sounds normal. No respiratory distress. He has no wheezes. He has no rales.   Abdominal: Soft. There is no tenderness.   Skin:   L lower leg scttered papulo pustular lesions with one on his heel that is raised and excoriated no cellulitis no fluctuance        Assessment:       ICD-10-CM ICD-9-CM    1. Cervicalgia M54.2 723.1 HYDROcodone-acetaminophen (NORCO) 5-325 MG Tablet   2. Biceps tendinitis of the right upper extremity M75.21 726.12 cephALEXin (KEFLEX) 500 MG Capsule   3. Insomnia, unspecified type G47.00 780.52 LORazepam (ATIVAN) 1 MG Tablet   4. Benign prostatic hyperplasia, unspecified whether lower urinary tract symptoms present N40.0 600.00    5. Coronary artery disease involving coronary bypass graft of native heart without angina pectoris I25.810 414.05    6. Cervical spinal stenosis M48.02 723.0    7. Stage 3 chronic kidney disease N18.3 585.3    8. Type 2 diabetes mellitus without complication, without long-term current use of insulin E11.9 250.00           Plan:   Patient advised to various Tx options and agreed to the following:  Continuation of current medications listed above,the importance of follow up visits.    Risk verses benefits of medication and treatments were discussed.  Patient voiced understanding.      Rev notes   rev labs   rf meds
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
## 2016-04-26 NOTE — Progress Notes
will hm next month  Rd keflex   adviosed not to traumatize his wound  No need for fire ants lesions to be treated   fu 1 mo nth  Orders Placed This Encounter   Medications   ? HYDROcodone-acetaminophen (NORCO) 5-325 MG Tablet     Sig: Take 1 tablet by mouth every 6 hours as needed for pain. Earliest Fill Date: 04/26/16     Dispense:  120 tablet     Refill:  0   ? cephALEXin (KEFLEX) 500 MG Capsule     Sig: Take 1 capsule by mouth 3 times daily.     Dispense:  21 capsule     Refill:  0   ? LORazepam (ATIVAN) 1 MG Tablet     Sig: 1 pill hs pr n insomnia     Dispense:  30 tablet     Refill:  0     No orders of the following type(s) were placed in this encounter: Procedures      Health Maintenance was reviewed. The patient's HM Topic list was:                                            Health Maintenance   Topic Date Due   ? Colon Cancer Screening  03/07/1997   ? Hemoglobin A1C  02/10/2016   ? Influenza Vaccine (1) 03/26/2016   ? USPSTF Hepatitis C Screening  05/29/2016 (Originally [birth year])   ? Preventive Wellness Visit  10/26/2016 (Originally 05/10/2014)   ? Diabetic Eye Exam  04/27/2016   ? Urine Protein  08/03/2016   ? Lipid Profile  11/10/2016   ? Creatinine  11/17/2016   ? Basic Metabolic Panel  11/17/2016   ? Diabetic Foot Exam  04/02/2017   ? DTaP,Tdap,and Td Vaccines (2 - Td) 01/17/2024   ? Pneumovax / Prevnar  Excluded   ? Zoster Vaccine  Excluded           HM    PSA1/16  PROSTATE  COLONOSC    ADV DIRY    FLU10/16  PRENV 6/16  PNEUMOV 8/17  ZOSTAV 6/16r    CBC5/18  CMP  LIPIDS  TSH  B12 1/16  VIT D  URINE DIP    HBA1C 5/16  FEET 5/16 podiatry fu  OPHTH10/16r  URINE MICROALB     EKG 4/16  ECHO 2016  STRESS TEST p    SEG DOPPLER 8/16    CARDIO FU  ENDOCRINOL FU DR CHEHADE -  PAIN MNG FU  NUTRITIONIST 8/16R    PAIN CONTRACT  URINE DOA

## 2016-05-05 ENCOUNTER — Encounter: Attending: Podiatrist | Primary: Family Medicine

## 2016-05-26 ENCOUNTER — Ambulatory Visit: Attending: Family Medicine | Primary: Family Medicine

## 2016-05-26 ENCOUNTER — Ambulatory Visit: Attending: "Endocrinology | Primary: Family Medicine

## 2016-05-26 DIAGNOSIS — G4700 Insomnia, unspecified: Secondary | ICD-10-CM

## 2016-05-26 DIAGNOSIS — K219 Gastro-esophageal reflux disease without esophagitis: Secondary | ICD-10-CM

## 2016-05-26 DIAGNOSIS — E119 Type 2 diabetes mellitus without complications: Principal | ICD-10-CM

## 2016-05-26 DIAGNOSIS — N189 Chronic kidney disease, unspecified: Secondary | ICD-10-CM

## 2016-05-26 DIAGNOSIS — M542 Cervicalgia: Secondary | ICD-10-CM

## 2016-05-26 DIAGNOSIS — E538 Deficiency of other specified B group vitamins: Secondary | ICD-10-CM

## 2016-05-26 DIAGNOSIS — I2510 Atherosclerotic heart disease of native coronary artery without angina pectoris: Secondary | ICD-10-CM

## 2016-05-26 DIAGNOSIS — Z794 Long term (current) use of insulin: Secondary | ICD-10-CM

## 2016-05-26 DIAGNOSIS — Z981 Arthrodesis status: Secondary | ICD-10-CM

## 2016-05-26 DIAGNOSIS — I10 Essential (primary) hypertension: Secondary | ICD-10-CM

## 2016-05-26 DIAGNOSIS — N183 Chronic kidney disease, stage 3 (moderate): Secondary | ICD-10-CM

## 2016-05-26 DIAGNOSIS — C801 Malignant (primary) neoplasm, unspecified: Secondary | ICD-10-CM

## 2016-05-26 DIAGNOSIS — E1142 Type 2 diabetes mellitus with diabetic polyneuropathy: Secondary | ICD-10-CM

## 2016-05-26 DIAGNOSIS — I252 Old myocardial infarction: Secondary | ICD-10-CM

## 2016-05-26 DIAGNOSIS — Z6826 Body mass index (BMI) 26.0-26.9, adult: Principal | ICD-10-CM

## 2016-05-26 DIAGNOSIS — N529 Male erectile dysfunction, unspecified: Secondary | ICD-10-CM

## 2016-05-26 MED ORDER — LORAZEPAM 1 MG PO TABS
1 refills | Status: CP
Start: 2016-05-26 — End: 2016-08-02

## 2016-05-26 MED ORDER — HYDROCODONE-ACETAMINOPHEN 5-325 MG PO TABS
1 | ORAL_TABLET | Freq: Four times a day (QID) | ORAL | 0 refills | Status: CP | PRN
Start: 2016-05-26 — End: 2016-08-02

## 2016-05-26 MED ORDER — LORAZEPAM 1 MG PO TABS
0 refills | Status: CP
Start: 2016-05-26 — End: 2016-05-26

## 2016-05-26 MED ORDER — GLUCOSE BLOOD VI STRP
ORAL_STRIP | 11 refills | Status: CP
Start: 2016-05-26 — End: 2016-07-05

## 2016-05-26 MED ORDER — HYDROCODONE-ACETAMINOPHEN 5-325 MG PO TABS
1 | ORAL_TABLET | Freq: Four times a day (QID) | ORAL | 0 refills | Status: CP | PRN
Start: 2016-05-26 — End: 2016-05-26

## 2016-05-26 NOTE — Progress Notes
anxiety or blunt affect.    Assessment   1-DM type 2, uncontrolled  2-Hypertension  3-Hyperlipidemia  4 neuropathy.      Plan   Downloaded pump bolusing better, but still missing boluses and not always consistent with checking glucose     Cont   Basal rate 0.8  midnight till 7 am   Basal 1   Carb ratio 1/15    1 for ecvery 50 > 100   bolusing better not checking       Orders Placed This Encounter   Procedures   ? Basic Metabolic Panel   ? Hemoglobin A1c     Medication orders placed this encounter   Medications   ? glucose blood (ONE TOUCH ULTRA TEST) Strip     Sig: USE 1 STRIP TO CHECK GLUCOSE 3 TO 5 TIMES DAILY BEFORE MEALS AS INSTRUCTED     Dispense:  50 strip     Refill:  11      Hypertension BP ok   Taking Metoprolol tartrate 50mg  BID   Taking losartan 50 mg daily  Still on norvac 5 bid - states that he taking only a "bite" of the dose, not twice a day  Has follow up with cardiology regarding reducing BP medications.     F/u 3 months

## 2016-05-26 NOTE — Progress Notes
?   glucose blood (ONE TOUCH ULTRA TEST) Strip USE 1 STRIP TO CHECK GLUCOSE 3 TO 5 TIMES DAILY BEFORE MEALS AS INSTRUCTED   ? [DISCONTINUED] HYDROcodone-acetaminophen (NORCO) 5-325 MG Tablet Take 1 tablet by mouth every 6 hours as needed for pain. Earliest Fill Date: 04/26/16   ? insulin aspart (NovoLOG) vial 100 units daily via insulin pump   ? insulin glargine (LANTUS SOLOSTAR) 100 UNIT/ML Solution Pen-injector Inject 25 Units into the skin nightly at bedtime.   ? [DISCONTINUED] LORazepam (ATIVAN) 1 MG Tablet 1 pill hs pr n insomnia   ? losartan-hydroCHLOROthiazide (HYZAAR) 100-25 MG Tablet Take 50/12.5 mg (1/2 tablet) by mouth 2 times daily.   ? metoprolol tartrate (LOPRESSOR) 100 MG Tablet Take 50 mg by mouth 2 times daily.   ? omeprazole (PriLOSEC) 40 MG Capsule Delayed Release Take 1 capsule by mouth daily.   ? ONE TOUCH ULTRA 2 W/DEVICE Kit Use as instructed.   ? sildenafil (VIAGRA) 100 MG tablet Take 1 Tablet by mouth as needed for erectile dysfunction.     No current facility-administered medications on file prior to visit.      Allergies   Allergen Reactions   ? Metformin Other (See Comments)     Unspecified   ? Nsaids Other (See Comments)     ?Azotemia         Review of Systems  Review of Systems   All other systems reviewed and are negative.          Objective:        VITAL SIGNS (all recorded)      Clinic Vitals       05/26/16 1129             Amb Encounter Vitals    Weight 85 kg (187 lb 8 oz)    -JJ at 05/26/16 1130       Height 1.778 m (5' 10")    -JJ at 05/26/16 1130       BMI (Calculated) 26.96    -JJ at 05/26/16 1130       BSA (Calculated - sq m) 2.05    -JJ at 05/26/16 1130       BP 122/62    -JJ at 05/26/16 1130       BP Location Left upper arm    -JJ at 05/26/16 1130       Position Sitting    -JJ at 05/26/16 1130       Pulse 57    -JJ at 05/26/16 1130       Pulse Source Monitor;Brachial    -JJ at 05/26/16 1130       Pulse Quality Normal    -JJ at 05/26/16 1130       Resp 14

## 2016-05-26 NOTE — Progress Notes
?   BUN/Creatinine Ratio 05/25/2016 21    ? Sodium 05/25/2016 140    ? Potassium 05/25/2016 5.1    ? Chloride 05/25/2016 109    ? CARBON DIOXIDE 05/25/2016 25    ? Calcium 05/25/2016 9.4    Office Visit on 11/26/2015   Component Date Value   ? Glucose Random 11/26/2015 159        Personal Hx   Denied Alcohol Use  Never A Smoker    ROS   Constitution: weight same       no  fever chills or night sweat. Energy level good for stated age.  Eyes: Denies any vision problem.  Cardiovascular:  Denies any acute chest pain at rest or with exertion.  No lower extremities edema. No shortness of breath with minimal or moderate exertion.  Respiratory:  No  coughing hemoptysis or wheezing.  Gastrointestinal: No history of nausea, vomiting,  yes heartburn,  no diarrhea, constipation, or abdominal pain. + gerd   Neurological: No  memory problem +   feet numbness or burning sensation. Musculoskeletal: No muscles aching or joints aching.  Psychiatric:  No sign of depression, no  irritability or nervousness.  Endocrine: (as reviewed in HPI).  No heat or cold intolerance.  Genitourinary:  No dysuria or hematuria + nocturia   Hematologic:  No easy bruising.    Vital Signs     Vitals:    05/26/16 1341   BP: 124/68   Pulse: 70   Resp: 16   Weight: 84.8 kg (187 lb)   Height: 1.778 m (5\' 10" )     Physical Exam   Constitution:   1-Vital sign as reviewed in the chart   2-Appearance: Well nourished, Normocephalic.   Cardiovascular:   1-Heart auscultation: regular rhythm no appreciate murmur.   3-Lower extremities: no edema   Respiratory:   1-Respiratory effort: No labored breathing.   2-Auscultation: lungs are clear to auscultation bilaterally;no wheezing, rhonchi or rales.     Musculosketetal:   1-Gait & Station: Normal gait and station, gait is smooth and coordinated.   Psychiatric:   1-Orientation: Patient appears well oriented to place, person and time.   2-Mood & affect: Patient appears in normal mood & affect, no agitation,

## 2016-05-26 NOTE — Progress Notes
Chief Complaint   ? Mr. Lendell CapriceCOKER is a 70   year old male here today for follow-up on type 2  diabetes  HPI     Type 2 DM    insulin regimen :  Basal rate 0.8 midnight till 7 am   1 u /h 7am - MN   1/15 units ratio with each meal    1 units for every 50 glucose increase  Off  METFORMIN       Accuchecks varies   130-291 last 2 weeks   Once low 43     Reviewed insulin pump download -    Hypoglycemia once     Usually does not eat breakfast  Eats 2 meals per day; not always counting carbs     Changing site every 3-4 days     A1c. 8.8    Creat  1.42    On atorvastatin. LDL 54 in April 2017  Neuropathy - on gabapentin   Due for eye exam   HTN - on norvasc, metoprolol and losartan   .  Current Meds   Current Outpatient Medications    Medication Sig Start Date End Date Taking? Authorizing Provider   amLODIPine (NORVASC) 10 MG Tablet Take 5 mg (1/2 tablet) by mouth daily. 04/18/16   Malcolm MetroSuryadevara, Siva K, MD   atorvastatin (LIPITOR) 40 MG Tablet Take 1 tablet by mouth daily. 06/05/15   [name], Ermalene SearingAmra, MD   cephALEXin (KEFLEX) 500 MG Capsule Take 1 capsule by mouth 3 times daily. 04/26/16   Gearlean AlfHadzic, [name], MD   cetirizine (ZyrTEC) 10 MG Tablet Take 5 mg (1/2 tablet) by mouth daily. 10/17/15   Malcolm MetroSuryadevara, Siva K, MD   clopidogrel (PLAVIX) 75 MG Tablet Take 1 tablet by mouth daily. 02/23/16   Malcolm MetroSuryadevara, Siva K, MD   gabapentin (NEURONTIN) 300 MG Capsule Take 1 capsule by mouth 2 times daily. 01/23/16   [name], [name], MD   glucose blood (ONE TOUCH ULTRA TEST) Strip USE 1 STRIP TO CHECK GLUCOSE 3 TO 5 TIMES DAILY BEFORE MEALS AS INSTRUCTED 03/31/16   Harlin Rainhehade, Joe M, MD   HYDROcodone-acetaminophen (NORCO) 5-325 MG Tablet Take 1 tablet by mouth every 6 hours as needed for pain. Earliest Fill Date: 06/25/16 06/25/16   Gearlean AlfHadzic, [name], MD   HYDROcodone-acetaminophen (NORCO) 5-325 MG Tablet Take 1 tablet by mouth every 6 hours as needed for pain. Earliest Fill Date: 04/26/16 04/26/16 05/26/16  Gearlean AlfHadzic, [name], MD

## 2016-05-26 NOTE — Progress Notes
-  JJ at 05/26/16 1130       Respiration Quality Normal    -JJ at 05/26/16 1130       Temp 36.7 ?C (98 ?F)    -JJ at 05/26/16 1130       Temperature Source Oral    -JJ at 05/26/16 1130       Pain Score Four    -JJ at 05/26/16 1130         User Key  (r) = Recorded By, (t) = Taken By, (c) = Cosigned By    Initials Name Effective Dates    Debby FreibergJJ Joudi, Jennifer, MA 01/20/16 -         Physical Exam   Constitutional: He appears well-developed and well-nourished. No distress.   HENT:   Head: Normocephalic.   Eyes: Pupils are equal, round, and reactive to light.   Cardiovascular: Normal rate, regular rhythm and normal heart sounds.  Exam reveals no gallop and no friction rub.    No murmur heard.  Pulmonary/Chest: Effort normal and breath sounds normal. No respiratory distress. He has no wheezes. He has no rales.   Abdominal: Soft. There is no tenderness.        Assessment:       ICD-10-CM ICD-9-CM    1. BMI 26.0-26.9,adult Z68.26 V85.22    2. Cervicalgia M54.2 723.1 HYDROcodone-acetaminophen (NORCO) 5-325 MG Tablet      DISCONTINUED: HYDROcodone-acetaminophen (NORCO) 5-325 MG Tablet   3. Insomnia, unspecified type G47.00 780.52 LORazepam (ATIVAN) 1 MG Tablet      DISCONTINUED: LORazepam (ATIVAN) 1 MG Tablet   4. Vitamin B12 deficiency E53.8 266.2    5. Type 2 diabetes mellitus without complication, without long-term current use of insulin E11.9 250.00    6. Stage 3 chronic kidney disease N18.3 585.3    7. Gastroesophageal reflux disease without esophagitis K21.9 530.81    8. History of myocardial infarction in 2015 I25.2 412    9. Status post cervical spinal fusion Z98.1 V45.4           Plan:   Patient is here for follow up of chronic medical conditions and is currently using the medications listed below.  The current medical conditions are mostly stable.     Please see the Review of symtoms below.  Rev notes   rev labs   fu endocrinology  Pending bw results  Rf meds   fu 2 months  Declines flu shot

## 2016-05-26 NOTE — Progress Notes
HYDROcodone-acetaminophen (NORCO) 5-325 MG Tablet Take 1 tablet by mouth every 6 hours as needed for pain. Earliest Fill Date: 05/26/16 05/26/16 05/26/16  Delton Coombes, MD   insulin aspart (NovoLOG) vial 100 units daily via insulin pump 11/14/15   Rosemary Holms, MD   insulin glargine (LANTUS SOLOSTAR) 100 UNIT/ML Solution Pen-injector Inject 25 Units into the skin nightly at bedtime. 04/30/15   Rosemary Holms, MD   LORazepam (ATIVAN) 1 MG Tablet 1 pill hs pr n insomnia 05/26/16   Delton Coombes, MD   LORazepam (ATIVAN) 1 MG Tablet 1 pill hs pr n insomnia 04/26/16 05/26/16  Delton Coombes, MD   LORazepam (ATIVAN) 1 MG Tablet 1 pill hs pr n insomnia 05/26/16 05/26/16  [name], [name], MD   losartan-hydroCHLOROthiazide (HYZAAR) 100-25 MG Tablet Take 50/12.5 mg (1/2 tablet) by mouth 2 times daily. 02/23/16   Mat Carne, MD   metoprolol tartrate (LOPRESSOR) 100 MG Tablet Take 50 mg by mouth 2 times daily.    Information, Historical   omeprazole (PriLOSEC) 40 MG Capsule Delayed Release Take 1 capsule by mouth daily. 10/04/14   Minna Antis, ARNP   ONE TOUCH ULTRA 2 W/DEVICE Kit Use as instructed. 09/27/14   Rosemary Holms, MD   sildenafil (VIAGRA) 100 MG tablet Take 1 Tablet by mouth as needed for erectile dysfunction. 03/21/14   Rosemary Holms, MD     Allergies   No Known Drug Allergies.    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  1-DM type 2, uncontrolled  2-ED  3-Hypertension  4-Hyperlipidemia.    [place]   Neck Surgery.    Family Hx   Paternal history of Diabetes Mellitus  Paternal history of Essential Hypertension  Family history of thyroid cancer?  no  Family history of thyroid disease? no     Office Visit on 03/31/2016   Component Date Value   ? Hemoglobin A1C 05/25/2016 8.8*   ? EAG (MG/DL) 05/25/2016 206    ? EAG (MMOL/L) 05/25/2016 11.4    ? Glucose 05/25/2016 154*   ? Urea Nitrogen 05/25/2016 30*   ? Creatinine 05/25/2016 1.42*   ? EGFR 05/25/2016 50*   ? Glom Filt Rate, Est Afri* 05/25/2016 58*

## 2016-05-26 NOTE — Progress Notes
Subjective:   Gary Walters is a 70 y.o. male being seen today for Medications Refill (i am here for medication refills)       HPI Patient is here for follow up of chronic medical conditions and is currently using the medications listed below.  The current medical conditions are mostly stable.     Please see the Review of symtoms below.   h/o cad cri dm htn backache   all cond stable  No cp sob apbian  Followed by the endocrinology just had his bw done  Needs rf meds    Past Medical History:   Diagnosis Date   ? CAD (coronary artery disease)    ? Cancer    ? CKD (chronic kidney disease) 08/20/2014   ? Diabetes mellitus    ? Hypertension      Past Surgical History:   Procedure Laterality Date   ? CARDIAC CATHERIZATION     ? CERVICAL FUSION     ? CORONARY ANGIOPLASTY     ? CORONARY ARTERY BYPASS GRAFT       Family History   Problem Relation Age of Onset   ? Cancer Mother    ? Diabetes Father    ? Blindness Father    ? Cancer Sister    ? Cancer Brother      Social History     Social History   ? Marital status: Married     Spouse name: N/A   ? Number of children: N/A   ? Years of education: N/A     Occupational History   ? Not on file.     Social History Main Topics   ? Smoking status: Never Smoker   ? Smokeless tobacco: Never Used   ? Alcohol use No   ? Drug use: No   ? Sexual activity: Yes     Partners: Female     Other Topics Concern   ? Not on file     Social History Narrative     Current Outpatient Prescriptions on File Prior to Visit   Medication Sig   ? amLODIPine (NORVASC) 10 MG Tablet Take 5 mg (1/2 tablet) by mouth daily.   ? atorvastatin (LIPITOR) 40 MG Tablet Take 1 tablet by mouth daily.   ? cephALEXin (KEFLEX) 500 MG Capsule Take 1 capsule by mouth 3 times daily.   ? cetirizine (ZyrTEC) 10 MG Tablet Take 5 mg (1/2 tablet) by mouth daily.   ? clopidogrel (PLAVIX) 75 MG Tablet Take 1 tablet by mouth daily.   ? gabapentin (NEURONTIN) 300 MG Capsule Take 1 capsule by mouth 2 times daily.

## 2016-05-26 NOTE — Progress Notes
Orders Placed This Encounter   Medications   ? DISCONTD: HYDROcodone-acetaminophen (NORCO) 5-325 MG Tablet     Sig: Take 1 tablet by mouth every 6 hours as needed for pain. Earliest Fill Date: 05/26/16     Dispense:  120 tablet     Refill:  0   ? DISCONTD: LORazepam (ATIVAN) 1 MG Tablet     Sig: 1 pill hs pr n insomnia     Dispense:  30 tablet     Refill:  0   ? HYDROcodone-acetaminophen (NORCO) 5-325 MG Tablet     Sig: Take 1 tablet by mouth every 6 hours as needed for pain. Earliest Fill Date: 06/25/16     Dispense:  120 tablet     Refill:  0   ? LORazepam (ATIVAN) 1 MG Tablet     Sig: 1 pill hs pr n insomnia     Dispense:  30 tablet     Refill:  1     No orders of the following type(s) were placed in this encounter: Procedures      Health Maintenance was reviewed. The patient's HM Topic list was:                                            Health Maintenance   Topic Date Due   ? Diabetic Eye Exam  04/27/2016   ? USPSTF Hepatitis C Screening  05/29/2016 (Originally 1946-05-15)   ? Preventive Wellness Visit  10/26/2016 (Originally 05/10/2014)   ? Urine Protein  08/03/2016   ? Hemoglobin A1C  08/25/2016   ? Lipid Profile  11/10/2016   ? Diabetic Foot Exam  04/02/2017   ? Creatinine  05/25/2017   ? Basic Metabolic Panel  05/25/2017   ? Colon Cancer Screening  11/08/2022   ? DTaP,Tdap,and Td Vaccines (2 - Td) 01/17/2024   ? Influenza Vaccine  Addressed   ? Pneumovax / Prevnar  Excluded   ? Zoster Vaccine  Excluded           HM    PSA1/16  PROSTATE  COLONOSC    ADV DIRY    FLU10/16;11/17d  PRENV 6/16  PNEUMOV 8/87F  ZOSTAV 6/16r    CBC5/18  CMP  LIPIDS  TSH  B12 1/16  VIT D  URINE DIP    HBA1C 5/16;9/17  FEET 5/16 podiatry fu  OPHTH10/16r;7/17  URINE MICROALB endo     EKG 4/16  ECHO 2016  STRESS TEST p    SEG DOPPLER 8/16    CARDIO FU  ENDOCRINOL FU DR CHEHADE -  PAIN MNG FU  NUTRITIONIST 8/16R    PAIN CONTRACT  URINE DOA

## 2016-05-27 ENCOUNTER — Encounter: Attending: Family Medicine | Primary: Family Medicine

## 2016-06-02 DIAGNOSIS — I25118 Atherosclerotic heart disease of native coronary artery with other forms of angina pectoris: Principal | ICD-10-CM

## 2016-06-02 DIAGNOSIS — R05 Cough: Principal | ICD-10-CM

## 2016-06-02 MED ORDER — ATORVASTATIN CALCIUM 40 MG PO TABS
3 refills | Status: CP
Start: 2016-06-02 — End: 2017-11-22

## 2016-06-03 MED ORDER — BENZONATATE 200 MG PO CAPS
0 refills | Status: CP
Start: 2016-06-03 — End: 2016-07-05

## 2016-06-08 ENCOUNTER — Encounter: Attending: Family Medicine | Primary: Family Medicine

## 2016-06-25 ENCOUNTER — Ambulatory Visit: Admit: 2016-06-25 | Discharge: 2016-06-25 | Attending: Podiatrist | Primary: Family Medicine

## 2016-06-25 DIAGNOSIS — M205X9 Other deformities of toe(s) (acquired), unspecified foot: Secondary | ICD-10-CM

## 2016-06-25 DIAGNOSIS — Z79899 Other long term (current) drug therapy: Secondary | ICD-10-CM

## 2016-06-25 DIAGNOSIS — I252 Old myocardial infarction: Secondary | ICD-10-CM

## 2016-06-25 DIAGNOSIS — M2041 Other hammer toe(s) (acquired), right foot: Secondary | ICD-10-CM

## 2016-06-25 DIAGNOSIS — E1149 Type 2 diabetes mellitus with other diabetic neurological complication: Secondary | ICD-10-CM

## 2016-06-25 DIAGNOSIS — E119 Type 2 diabetes mellitus without complications: Secondary | ICD-10-CM

## 2016-06-25 DIAGNOSIS — E1122 Type 2 diabetes mellitus with diabetic chronic kidney disease: Secondary | ICD-10-CM

## 2016-06-25 DIAGNOSIS — I10 Essential (primary) hypertension: Secondary | ICD-10-CM

## 2016-06-25 DIAGNOSIS — B351 Tinea unguium: Secondary | ICD-10-CM

## 2016-06-25 DIAGNOSIS — N189 Chronic kidney disease, unspecified: Secondary | ICD-10-CM

## 2016-06-25 DIAGNOSIS — Z794 Long term (current) use of insulin: Secondary | ICD-10-CM

## 2016-06-25 DIAGNOSIS — E1140 Type 2 diabetes mellitus with diabetic neuropathy, unspecified: Principal | ICD-10-CM

## 2016-06-25 DIAGNOSIS — M2042 Other hammer toe(s) (acquired), left foot: Secondary | ICD-10-CM

## 2016-06-25 DIAGNOSIS — C801 Malignant (primary) neoplasm, unspecified: Secondary | ICD-10-CM

## 2016-06-25 DIAGNOSIS — Z7902 Long term (current) use of antithrombotics/antiplatelets: Secondary | ICD-10-CM

## 2016-06-25 DIAGNOSIS — K219 Gastro-esophageal reflux disease without esophagitis: Secondary | ICD-10-CM

## 2016-06-25 DIAGNOSIS — E1165 Type 2 diabetes mellitus with hyperglycemia: Secondary | ICD-10-CM

## 2016-06-25 DIAGNOSIS — E1142 Type 2 diabetes mellitus with diabetic polyneuropathy: Secondary | ICD-10-CM

## 2016-06-25 DIAGNOSIS — I129 Hypertensive chronic kidney disease with stage 1 through stage 4 chronic kidney disease, or unspecified chronic kidney disease: Secondary | ICD-10-CM

## 2016-06-25 DIAGNOSIS — E785 Hyperlipidemia, unspecified: Secondary | ICD-10-CM

## 2016-06-25 DIAGNOSIS — I2510 Atherosclerotic heart disease of native coronary artery without angina pectoris: Secondary | ICD-10-CM

## 2016-06-25 MED ORDER — LIDOCAINE-PRILOCAINE 2.5-2.5 % EX CREA
5 refills | Status: CP
Start: 2016-06-25 — End: 2016-08-02

## 2016-06-28 NOTE — Progress Notes
Department of Orthopedics   Established Patient Visit  Division Of Podiatric Medicine and Surgery         Assessment:     ICD-10-CM ICD-9-CM   1. Diabetic neuropathy with neurologic complication E11.40 250.60    E11.49 357.2   2. Type 2 diabetes mellitus with diabetic neuropathy, with long-term current use of insulin E11.40 250.60    Z79.4 357.2     V58.67   3. Comprehensive diabetic foot examination, type 2 DM, encounter for E11.9 250.00   4. Toe contracture, unspecified laterality M20.5X9 735.8   5. Type 2 diabetes mellitus with peripheral neuropathy E11.42 250.60     357.2   6. Uncontrolled type 2 diabetes mellitus with peripheral neuropathy E11.42 250.62    E11.65 357.2   7. Onychomycosis B35.1 110.1       ADA Classification:   1 = Loss of protective sensation or PAD or deformity    Plan:  1) Pt seen and evaluated   2) Rx for Dm shoes, sent to [facility], patient handed written referral today to bring with as supplement.  3) Discussed Dm foot care with the patient including strict glucose regulation, daily inspections of the feet, and prompt reporting of any lesions, cuts, or other non-healing wounds to podiatric physicians.  4) RTC in 6 months     - Patient to use moisturizer to the bilateral feet daily  - No wounds appreciated at this time.  -Rx provided for EMLA cream for the patient to apply to the bilateral feet once daily as needed for pain.  RTC 6 week for wound check    Orders Placed This Encounter   Procedures   ? Refer to DME     Orders Placed This Encounter   Medications   ? lidocaine-prilocaine (EMLA) 2.5-2.5 % EX Cream     Sig: Apply 1 g topically to the bilateral feet     Dispense:  1 Bottle     Refill:  5       Referring Physician/Primary Care Physician:  Gearlean AlfHadzic, [name], MD    Chief Complaint/Reason for Return:  Chief Complaint   Patient presents with   ? Diabetic Foot Care   ? Toe Pain     right foot big toe         HPI:  Patient seen today in office for DM foot care. Patient reports most recent

## 2016-06-28 NOTE — Progress Notes
Occupational History   ? Not on file.     Social History Main Topics   ? Smoking status: Never Smoker   ? Smokeless tobacco: Never Used   ? Alcohol use No   ? Drug use: No   ? Sexual activity: Yes     Partners: Female     Other Topics Concern   ? Not on file     Social History Narrative       Family Hx:  Family History   Problem Relation Age of Onset   ? Cancer Mother    ? Diabetes Father    ? Blindness Father    ? Cancer Sister    ? Cancer Brother        ROS:   Constitutional: denies fever, chills, or night sweats  HENT: denies headaches or changes in hearing  Eyes: denies visual disturbance  Respiratory: denies cough, shortness of breath  Cardiovascular: denies chest pain or palpitations  GI: denies nausea, vomiting, diarrhea, constipation, anorexia, and weight loss or gain  Neurological: denies numbness, weakness,   Endocrine: denies malaise, sleepiness, hot flashes  Hematologic/Lymphatic: denies fatigue, lumps or bumps anywhere, groin or armpit lumps, edema  GU: denies dysuria, hematuria, and increase or decrease in urination  Integumentary: denies rashes or other lesions  Psychiatric: denies changes in mood or affect        Physical Exam:  Blood pressure 129/70, pulse 66, temperature 36.2 ?C (97.2 ?F), resp. rate 16, height 1.778 m (5\' 10" ), weight 87.7 kg (193 lb 6.4 oz).    General: Oriented x 3 in NAD. Well nourished    Podiatric Specific    Vascular: DP pulse 2/4 and PT pulse 1/4 Left. Capillary refill time less than 3 seconds to all digits. Feet temperature WNL.     Neuro: Epicritic sense of light touch intact  bilat to tibial, deep peroneal, superficial peroneal, sural, and saphenous distributions.    Derm: Skin is warm, dry and supple. Superficial abrasion to dorsal R hallux with 2cm's surrounding erythema, mild warmth, no purulent drainage.     Musculoskeletal: no history of amputation. Hammertoes 2-5 bilat. Muscle strength 5/5 Bilateral. Active EHL, FHL, TA, GSC bilat. Neg for pain on
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
## 2016-06-28 NOTE — Progress Notes
palpation or ROM. Pain on palption about R great toe.    Counseling/Education:  Patient was counseled on proper DM foot care including but not limited to a thorough inspection of the feet daily. The use of a moisturizer is suggested to avoid xerosis which may allow for the breakdown of high pressure areas as well as an entry of pathogens into the breached epithelium. Patient educated on the use of proper footwear to provide cushioning in high risk areas and areas of bony prominences. The use of white socks as well as inspection of tile when exiting the shower/bathtub is also suggested to prevent an open sore or cut to go un-noticed. The importance of a tight regulation of blood glucose was also thoroughly explained and advised to avoid future complications. Patient was advised to perform routine visits to PCP as well as other specialties realizing that diabetes is a multi-systemic disorder affecting the microvasculature of both the eyes and kidneys. Patient advised to call immediately if any complications or wounds were to arise.

## 2016-06-28 NOTE — Progress Notes
?   LORazepam (ATIVAN) 1 MG Tablet 1 pill hs pr n insomnia   ? losartan-hydroCHLOROthiazide (HYZAAR) 100-25 MG Tablet Take 50/12.5 mg (1/2 tablet) by mouth 2 times daily.   ? metoprolol tartrate (LOPRESSOR) 100 MG Tablet Take 50 mg by mouth 2 times daily.   ? omeprazole (PriLOSEC) 40 MG Capsule Delayed Release Take 1 capsule by mouth daily.   ? ONE TOUCH ULTRA 2 W/DEVICE Kit Use as instructed.   ? sildenafil (VIAGRA) 100 MG tablet Take 1 Tablet by mouth as needed for erectile dysfunction.     Last reviewed on 06/25/2016 10:15 AM by Pryor Montes, MA    Active Problems:  Patient Active Problem List   Diagnosis   ? Diabetes mellitus   ? BPH (benign prostatic hyperplasia)   ? Esophageal dysmotility   ? History of noncompliance   ? Unspecified cataract   ? Chronic headaches   ? Vitamin B12 deficiency   ? GERD (gastroesophageal reflux disease)   ? Labile essential hypertension   ? Dyslipidemia   ? Erectile dysfunction   ? History of cutaneous melanoma   ? Status post cervical spinal fusion   ? Hammer toe   ? Plantar fibromatosis   ? Osteoarthritis of the knees   ? CAD (coronary artery disease)   ? History of 3V CABG in 2015   ? History of myocardial infarction in 2015   ? CKD (chronic kidney disease)   ? Cervical spinal stenosis   ? History of shingles   ? Status post insulin pump placement   ? DOE (dyspnea on exertion)   ? Right rotator cuff tendinopathy   ? Allergic rhinitis   ? Diastolic dysfunction       PMH:  Past Medical History:   Diagnosis Date   ? CAD (coronary artery disease)    ? Cancer    ? CKD (chronic kidney disease) 08/20/2014   ? Diabetes mellitus    ? Hypertension        PSH:  Past Surgical History:   Procedure Laterality Date   ? CARDIAC CATHERIZATION     ? CERVICAL FUSION     ? CORONARY ANGIOPLASTY     ? CORONARY ARTERY BYPASS GRAFT         Social Hx:  Social History     Social History   ? Marital status: Married     Spouse name: N/A   ? Number of children: N/A   ? Years of education: N/A

## 2016-06-29 NOTE — Progress Notes
Teaching/Attestation Statement  I saw and evaluated the patient. I discussed with the resident and agree with resident?s findings and plan as documented in the resident?s note. I was present for the entire procedure.

## 2016-07-05 ENCOUNTER — Ambulatory Visit: Attending: Family Medicine | Primary: Family Medicine

## 2016-07-05 DIAGNOSIS — E119 Type 2 diabetes mellitus without complications: Principal | ICD-10-CM

## 2016-07-05 DIAGNOSIS — N521 Erectile dysfunction due to diseases classified elsewhere: Secondary | ICD-10-CM

## 2016-07-05 DIAGNOSIS — N401 Enlarged prostate with lower urinary tract symptoms: Secondary | ICD-10-CM

## 2016-07-05 DIAGNOSIS — I2510 Atherosclerotic heart disease of native coronary artery without angina pectoris: Secondary | ICD-10-CM

## 2016-07-05 DIAGNOSIS — R05 Cough: Secondary | ICD-10-CM

## 2016-07-05 DIAGNOSIS — I5189 Other ill-defined heart diseases: Secondary | ICD-10-CM

## 2016-07-05 DIAGNOSIS — Z794 Long term (current) use of insulin: Secondary | ICD-10-CM

## 2016-07-05 DIAGNOSIS — I10 Essential (primary) hypertension: Secondary | ICD-10-CM

## 2016-07-05 DIAGNOSIS — E1142 Type 2 diabetes mellitus with diabetic polyneuropathy: Secondary | ICD-10-CM

## 2016-07-05 DIAGNOSIS — N189 Chronic kidney disease, unspecified: Secondary | ICD-10-CM

## 2016-07-05 DIAGNOSIS — Z6828 Body mass index (BMI) 28.0-28.9, adult: Principal | ICD-10-CM

## 2016-07-05 DIAGNOSIS — Z0000 Encounter for general adult medical examination without abnormal findings: Secondary | ICD-10-CM

## 2016-07-05 DIAGNOSIS — R351 Nocturia: Secondary | ICD-10-CM

## 2016-07-05 DIAGNOSIS — C801 Malignant (primary) neoplasm, unspecified: Secondary | ICD-10-CM

## 2016-07-05 DIAGNOSIS — I25810 Atherosclerosis of coronary artery bypass graft(s) without angina pectoris: Secondary | ICD-10-CM

## 2016-07-05 MED ORDER — BENZONATATE 200 MG PO CAPS
0 refills | Status: CP
Start: 2016-07-05 — End: 2016-12-22

## 2016-07-05 MED ORDER — SILDENAFIL CITRATE 100 MG PO TABS
100 mg | ORAL | 3 refills | Status: CP | PRN
Start: 2016-07-05 — End: 2017-03-17

## 2016-07-05 MED ORDER — GLUCOSE BLOOD VI STRP
ORAL_STRIP | 11 refills | Status: CP
Start: 2016-07-05 — End: 2016-09-01

## 2016-07-05 MED ORDER — TAMSULOSIN HCL 0.4 MG PO CAPS
.4 mg | Freq: Every day | ORAL | 3 refills | Status: CP
Start: 2016-07-05 — End: 2016-08-02

## 2016-07-05 MED ORDER — METOPROLOL SUCCINATE ER 25 MG PO TB24
25 mg | Freq: Two times a day (BID) | ORAL | 1 refills | Status: CP
Start: 2016-07-05 — End: 2016-12-14

## 2016-07-05 NOTE — Progress Notes
?   gabapentin (NEURONTIN) 300 MG Capsule Take 1 capsule by mouth 2 times daily.   ? [DISCONTINUED] glucose blood (ONE TOUCH ULTRA TEST) Strip USE 1 STRIP TO CHECK GLUCOSE 3 TO 5 TIMES DAILY BEFORE MEALS AS INSTRUCTED   ? HYDROcodone-acetaminophen (NORCO) 5-325 MG Tablet Take 1 tablet by mouth every 6 hours as needed for pain. Earliest Fill Date: 06/25/16   ? insulin aspart (NovoLOG) vial 100 units daily via insulin pump   ? insulin glargine (LANTUS SOLOSTAR) 100 UNIT/ML Solution Pen-injector Inject 25 Units into the skin nightly at bedtime.   ? lidocaine-prilocaine (EMLA) 2.5-2.5 % EX Cream Apply 1 g topically to the bilateral feet   ? LORazepam (ATIVAN) 1 MG Tablet 1 pill hs pr n insomnia   ? losartan-hydroCHLOROthiazide (HYZAAR) 100-25 MG Tablet Take 50/12.5 mg (1/2 tablet) by mouth 2 times daily.   ? metoprolol tartrate (LOPRESSOR) 100 MG Tablet Take 50 mg by mouth 2 times daily.   ? omeprazole (PriLOSEC) 40 MG Capsule Delayed Release Take 1 capsule by mouth daily.   ? ONE TOUCH ULTRA 2 W/DEVICE Kit Use as instructed.   ? [DISCONTINUED] sildenafil (VIAGRA) 100 MG tablet Take 1 Tablet by mouth as needed for erectile dysfunction.     No current facility-administered medications on file prior to visit.      Allergies   Allergen Reactions   ? Metformin Other (See Comments)     Unspecified   ? Nsaids Other (See Comments)     ?Azotemia         Review of Systems  Review of Systems        Objective:        VITAL SIGNS (all recorded)      Clinic Vitals       07/05/16 0950             Amb Encounter Vitals    Weight 88.9 kg (196 lb)    -JJ at 07/05/16 0951       Height 1.778 m (5' 10")    -JJ at 07/05/16 0951       BMI (Calculated) 28.18    -JJ at 07/05/16 0951       BSA (Calculated - sq m) 2.1    -JJ at 07/05/16 0951       BP 135/85    -JJ at 07/05/16 0951       BP Location Left upper arm    -JJ at 07/05/16 0951       Position Sitting    -JJ at 07/05/16 0951       Pulse 85    -JJ at 07/05/16 0347

## 2016-07-05 NOTE — Progress Notes
Pulse Source Monitor;Brachial    -JJ at 07/05/16 0951       Pulse Quality Normal    -JJ at 07/05/16 0951       Resp 15    -JJ at 07/05/16 0951       Respiration Quality Normal    -JJ at 07/05/16 0951       Temp 36.7 ?C (98 ?F)    -JJ at 07/05/16 0951       Temperature Source Oral    -JJ at 07/05/16 0951       Pain Score Five    -JJ at 07/05/16 [id number]         User Key  (r) = Recorded By, (t) = Taken By, (c) = Cosigned By    Initials Name Effective Dates    Debby FreibergJJ Joudi, Jennifer, [place] 01/20/16 -         Physical Exam     Assessment:       ICD-10-CM ICD-9-CM    1. BMI 28.0-28.9,adult Z68.28 V85.24    2. Type 2 diabetes mellitus with diabetic polyneuropathy, with long-term current use of insulin E11.42 250.60 glucose blood (ONE TOUCH ULTRA TEST) VI Strip    Z79.4 357.2      V58.67    3. Coronary artery disease without angina pectoris I25.810 414.05 metoprolol succinate (TOPROL-XL) 25 MG PO Tablet Extended Release 24 Hour   4. Diastolic dysfunction I51.9 429.9 metoprolol succinate (TOPROL-XL) 25 MG PO Tablet Extended Release 24 Hour   5. Labile essential hypertension I10 401.9 metoprolol succinate (TOPROL-XL) 25 MG PO Tablet Extended Release 24 Hour   6. Cough R05 786.2 benzonatate (TESSALON) 200 MG PO Capsule   7. Erectile dysfunction due to diseases classified elsewhere N52.1 607.84 sildenafil (VIAGRA) 100 MG PO Tablet   8. BPH associated with nocturia N40.1 600.01 tamsulosin (FLOMAX) 0.4 MG PO Capsule    R35.1 788.43    9. Medicare annual wellness visit, initial Z00.00 V70.0           Plan:   Patient advised to various Tx options and agreed to the following:  Continuation of current medications listed above,the importance of follow up visits.    Risk verses benefits of medication and treatments were discussed.  Patient voiced understanding.      Rev notes   fu endocrinol  [facility] for hypoglycemia and discuss with endocrinologist  Fu cardiol in 2 weeks   if any cp go to er   rev dda scr   fu as sch

## 2016-07-05 NOTE — Progress Notes
MINI-COG TOTAL.  Score 0-2 positive for cognitive impairment, 3-5 negative screen 4           VITAL SIGNS (all recorded)      Clinic Vitals       07/05/16 0950             Amb Encounter Vitals    Weight 88.9 kg (196 lb)    -JJ at 07/05/16 0951       Height 1.778 m (5\' 10" )    -JJ at 07/05/16 0951       BMI (Calculated) 28.18    -JJ at 07/05/16 0951       BSA (Calculated - sq m) 2.1    -JJ at 07/05/16 0951       BP 135/85    -JJ at 07/05/16 0951       BP Location Left upper arm    -JJ at 07/05/16 0951       Position Sitting    -JJ at 07/05/16 0951       Pulse 85    -JJ at 07/05/16 0951       Pulse Source Monitor;Brachial    -JJ at 07/05/16 0951       Pulse Quality Normal    -JJ at 07/05/16 0951       Resp 15    -JJ at 07/05/16 0951       Respiration Quality Normal    -JJ at 07/05/16 0951       Temp 36.7 ?C (98 ?F)    -JJ at 07/05/16 0951       Temperature Source Oral    -JJ at 07/05/16 0951       Pain Score Five    -JJ at 07/05/16 [id number]         User Key  (r) = Recorded By, (t) = Taken By, (c) = Cosigned By    Initials Name Effective Dates    Debby FreibergJJ Joudi, Jennifer, MA 01/20/16 -           Assessment:       ICD-10-CM ICD-9-CM    1. BMI 28.0-28.9,adult Z68.28 V85.24    2. Type 2 diabetes mellitus with diabetic polyneuropathy, with long-term current use of insulin E11.42 250.60 glucose blood (ONE TOUCH ULTRA TEST) VI Strip    Z79.4 357.2      V58.67    3. Coronary artery disease without angina pectoris I25.810 414.05 metoprolol succinate (TOPROL-XL) 25 MG PO Tablet Extended Release 24 Hour   4. Diastolic dysfunction I51.9 429.9 metoprolol succinate (TOPROL-XL) 25 MG PO Tablet Extended Release 24 Hour   5. Labile essential hypertension I10 401.9 metoprolol succinate (TOPROL-XL) 25 MG PO Tablet Extended Release 24 Hour   6. Cough R05 786.2 benzonatate (TESSALON) 200 MG PO Capsule   7. Erectile dysfunction due to diseases classified elsewhere N52.1 607.84 sildenafil (VIAGRA) 100 MG PO Tablet

## 2016-07-05 NOTE — Progress Notes
Medication list documented 1159F - Medication list documented in medical record   Medication reviewed and reconciled 1160F - Medication review and reconciliation   Functional Status Assessment 1170F - Functional Status Assessment (ADL's, IADL's, Ambulation Status, Cognitive Status)   Eating Feeds self completely.   Continence: Complete bowel and/or bladder control, or able to self-cath.   Bathing: Bathes or showers without help.   Dressing: Dresses self completely.   Toileting: Uses toilet by self.   Transferring: Is able to move in and out of bed or chair.   Home Maintenance Independent   Cooking Independent   Management consultantLaundry Independent   Telephone Independent   Finances Independent   Shopping Independent   Medications Independent   Transport Independent   Ambulation status Independent   Cognitive status:  The patient  does not have cognitive impairment;does not have dementia;does not have problems with memory or concentration   Depression screening 3725F - Screening for Depression   Feeling down, depressed, or hopeless? No   Loss of interest or pleasure in doing things? No   Pain screening 1125F - Pain screening severity quantified.  Pain issues are present.   Has patient fallen or had problems with walking or balance in the past year? 1101F - No falls in past year or 1 fall with no injury   Smoking Status 1033F - Current non-smoker   Tobacco Cessation:  4004F - Tobacco screening and tobacco cessation intervention (counseling and/or pharmacotherapy)   Patient who had an inpatient admission should be seen within 30 days of discharge and their medications should be reviewed, including a list of current medications. 1111F - Medication reconciliation post hospital discharge.  All meds reviewed and discussed with patient.   Select if you assessed the patient for Urinary Incontinence 1090F - Urinary Incontinence Assessment       Objective:      Mini Cog  07/05/2016

## 2016-07-05 NOTE — Progress Notes
Subjective:   Gary Walters is a 70 y.o. male here for his Initial Medicare Annual Wellness (AWV) (i am here for AWV)      Patient Care Team:  Gearlean AlfHadzic, [name], MD as PCP - General (Family Medicine)     Past Medical History:   Diagnosis Date   ? CAD (coronary artery disease)    ? Cancer    ? CKD (chronic kidney disease) 08/20/2014   ? Diabetes mellitus    ? Hypertension      Past Surgical History:   Procedure Laterality Date   ? CARDIAC CATHERIZATION     ? CERVICAL FUSION     ? CORONARY ANGIOPLASTY     ? CORONARY ARTERY BYPASS GRAFT       Family History   Problem Relation Age of Onset   ? Cancer Mother    ? Diabetes Father    ? Blindness Father    ? Cancer Sister    ? Cancer Brother      Social History   Substance Use Topics   ? Smoking status: Never Smoker   ? Smokeless tobacco: Never Used   ? Alcohol use No     Current Outpatient Prescriptions on File Prior to Visit   Medication Sig   ? amLODIPine (NORVASC) 10 MG Tablet Take 5 mg (1/2 tablet) by mouth daily.   ? atorvastatin (LIPITOR) 40 MG Tablet TAKE ONE TABLET BY MOUTH ONCE DAILY   ? [DISCONTINUED] benzonatate (TESSALON) 200 MG Capsule TAKE ONE CAPSULE BY MOUTH THREE TIMES DAILY AS NEEDED   ? cetirizine (ZyrTEC) 10 MG Tablet Take 5 mg (1/2 tablet) by mouth daily.   ? clopidogrel (PLAVIX) 75 MG Tablet Take 1 tablet by mouth daily.   ? gabapentin (NEURONTIN) 300 MG Capsule Take 1 capsule by mouth 2 times daily.   ? [DISCONTINUED] glucose blood (ONE TOUCH ULTRA TEST) Strip USE 1 STRIP TO CHECK GLUCOSE 3 TO 5 TIMES DAILY BEFORE MEALS AS INSTRUCTED   ? HYDROcodone-acetaminophen (NORCO) 5-325 MG Tablet Take 1 tablet by mouth every 6 hours as needed for pain. Earliest Fill Date: 06/25/16   ? insulin aspart (NovoLOG) vial 100 units daily via insulin pump   ? insulin glargine (LANTUS SOLOSTAR) 100 UNIT/ML Solution Pen-injector Inject 25 Units into the skin nightly at bedtime.   ? lidocaine-prilocaine (EMLA) 2.5-2.5 % EX Cream Apply 1 g topically to the bilateral feet

## 2016-07-05 NOTE — Progress Notes
Subjective:   Gary Walters is a 70 y.o. male being seen today for Initial Medicare Annual Wellness (AWV) (i am here for AWV)       HPI Patient is here for follow up of chronic medical conditions and is currently using the medications listed below.  The current medical conditions are mostly stable.     Please see the Review of symtoms below.   h/o dm cad htn bph  All cond stabl  No angina, he is very active   Physically  Pending appt with cardiol  Here for the awv    Past Medical History:   Diagnosis Date   ? CAD (coronary artery disease)    ? Cancer    ? CKD (chronic kidney disease) 08/20/2014   ? Diabetes mellitus    ? Hypertension      Past Surgical History:   Procedure Laterality Date   ? CARDIAC CATHERIZATION     ? CERVICAL FUSION     ? CORONARY ANGIOPLASTY     ? CORONARY ARTERY BYPASS GRAFT       Family History   Problem Relation Age of Onset   ? Cancer Mother    ? Diabetes Father    ? Blindness Father    ? Cancer Sister    ? Cancer Brother      Social History     Social History   ? Marital status: Married     Spouse name: N/A   ? Number of children: N/A   ? Years of education: N/A     Occupational History   ? Not on file.     Social History Main Topics   ? Smoking status: Never Smoker   ? Smokeless tobacco: Never Used   ? Alcohol use No   ? Drug use: No   ? Sexual activity: Yes     Partners: Female     Other Topics Concern   ? Not on file     Social History Narrative     Current Outpatient Prescriptions on File Prior to Visit   Medication Sig   ? amLODIPine (NORVASC) 10 MG Tablet Take 5 mg (1/2 tablet) by mouth daily.   ? atorvastatin (LIPITOR) 40 MG Tablet TAKE ONE TABLET BY MOUTH ONCE DAILY   ? [DISCONTINUED] benzonatate (TESSALON) 200 MG Capsule TAKE ONE CAPSULE BY MOUTH THREE TIMES DAILY AS NEEDED   ? cetirizine (ZyrTEC) 10 MG Tablet Take 5 mg (1/2 tablet) by mouth daily.   ? clopidogrel (PLAVIX) 75 MG Tablet Take 1 tablet by mouth daily.

## 2016-07-05 NOTE — Progress Notes
ENDOCRINOL FU DR Maye HidesHEHADE -  PAIN MNG FU  NUTRITIONIST 8/16R    PAIN CONTRACT  URINE DOA

## 2016-07-05 NOTE — Progress Notes
8. BPH associated with nocturia N40.1 600.01 tamsulosin (FLOMAX) 0.4 MG PO Capsule    R35.1 788.43    9. Medicare annual wellness visit, initial Z00.00 V70.0      Medicare AWV completed.      Plan:     During the exam the following risks were identified and plans for these risks are discussed below:      Orders Placed This Encounter   Medications   ? glucose blood (ONE TOUCH ULTRA TEST) VI Strip     Sig: USE 1 STRIP TO CHECK GLUCOSE 3 TO 5 TIMES DAILY BEFORE MEALS AS INSTRUCTED     Dispense:  50 strip     Refill:  11   ? tamsulosin (FLOMAX) 0.4 MG PO Capsule     Sig: Take 1 capsule by mouth daily.     Dispense:  90 capsule     Refill:  3   ? metoprolol succinate (TOPROL-XL) 25 MG PO Tablet Extended Release 24 Hour     Sig: Take 1 tablet by mouth 2 times daily.     Dispense:  180 tablet     Refill:  1   ? benzonatate (TESSALON) 200 MG PO Capsule     Sig: TAKE ONE CAPSULE BY MOUTH THREE TIMES DAILY AS NEEDED     Dispense:  45 capsule     Refill:  0     Please consider 90 day supplies to promote better adherence   ? sildenafil (VIAGRA) 100 MG PO Tablet     Sig: Take 1 tablet by mouth as needed for erectile dysfunction.     Dispense:  5 tablet     Refill:  3     No orders of the following type(s) were placed in this encounter: Procedures    No orders of the following type were placed in this encounter: Referrals      During the course of the visit the patient was educated and counseled about appropriate screening and preventive services as follows:  Health Maintenance Reminders       Due Completion Dates    USPSTF Hepatitis C Screening 06/08/2017 (Originally 02/13/1946) ---    Urine Protein 08/03/2016 08/04/2015, 04/29/2015, 06/25/2014    Hemoglobin A1C 08/25/2016 05/25/2016, 11/11/2015, 05/30/2015    Lipid Profile 11/10/2016 11/11/2015, 05/30/2015, 12/11/2014    Diabetic Eye Exam 02/10/2017 02/11/2016 (Done), 04/28/2015 (Done), 04/25/2013 (Done)    Creatinine 05/25/2017 05/25/2016, 11/18/2015, 11/11/2015

## 2016-07-05 NOTE — Progress Notes
Basic Metabolic Panel 05/25/2017 05/25/2016, 11/18/2015, 11/11/2015    Diabetic Foot Exam 06/25/2017 06/25/2016, 04/02/2016, 04/02/2016    Preventive Wellness Visit 07/05/2017 07/05/2016 (Done), 05/10/2013    Colon Cancer Screening 11/08/2022 11/07/2012    DTaP,Tdap,and Td Vaccines (2 - Td) 01/17/2024 01/16/2014          Health Maintenance Topics with due status: Excluded       Topic Date Due    Pneumovax / Prevnar Excluded    Zoster Vaccine Excluded       Preventive Health Counseling    Age appropriate anticipatory guidelines discussed with the patient.    Nutrition: Stressed importance of moderation in sodium/caffeine intake, saturated fat and cholesterol, caloric balance, sufficient intake of fresh fruits, vegetables, fiber, calcium, and iron.  Exercise: Stressed the importance of regular exercise.   Substance Abuse: Discussed cessation/primary prevention of tobacco, alcohol, or other drug use; driving or other dangerous activities under the influence; availability of treatment for abuse.   Sexuality: Discussed sexually transmitted diseases, partner selection, and use of condoms.  Dental health: Discussed importance of regular tooth brushing, flossing, and dental visits.  End of Life:  Voluntary advance care planning was held with patient and/or surrogates about creating/reviewing their advance directive. An advance directive is a document appointing an agent and/or recording the wishes of a patient pertaining to his/her medical treatment at a future tie should he/she lack decisional capacity at that time.    The patient verbalized understanding of the above assessments and treatment and agrees to follow through.         After visit summary provided with screening schedule, treatment options and education for established conditions or risk factors, and personized health advice.

## 2016-07-05 NOTE — Progress Notes
Continence: Complete bowel and/or bladder control, or able to self-cath.   Bathing: Bathes or showers without help.   Dressing: Dresses self completely.   Toileting: Uses toilet by self.   Transferring: Is able to move in and out of bed or chair.   Home Maintenance Independent   Cooking Independent   Laundry Independent   Telephone Independent   Finances Independent   Medications Independent   Transport Independent   Ambulation status Independent   Cognitive status:  The patient  does not have cognitive impairment;does not have dementia;does not have problems with memory or concentration        PHQ-9/Mood Disorder Scores 07/05/2016   PHQ-9 Total Score 1        GAD7 07/05/2016   GAD7 Total Score 2       Fall risk - Encounter Level       One or more falls in the last year:  Yes   Advised to use a cane or walker to get around safely:  No   Feels unsteady when walking:  No   Steadies self on furniture while walking at home:  No   Worried about falling:  No   Needs to push with hands when rising from a chair:  No   Has trouble stepping up onto a curb:  No   Often has to rush to the toilet:  No   Has lost some feeling in feet:  Yes   Takes medicine that makes him/her feel lightheaded or more tired than usual:  No   Takes medicine to sleep or improve mood:  Yes   Often feels sad or depressed:  No          HEDIS 07/05/2016   Hgb A1C 3045F - Most recent A1C level within 12 months 7.0% to 9.0%   Lipid Management  3048F - Most recent LDL-C level within 12 months <100 mg/dL   Nephropathy Screening 3061F - Negative micro albuminuria test result   Systolic blood pressure  3075F - Systolic blood pressure 130-15139mm Hg   Diastolic blood pressure 3079F - Diastolic blood pressure 80-10089mm Hg   Documentation in record  1157F - Advanced Care Planning documentation in record   Discussion documented in record 1158F - Advanced Care Planning discussion documented in record

## 2016-07-05 NOTE — Progress Notes
Orders Placed This Encounter   Medications   ? glucose blood (ONE TOUCH ULTRA TEST) VI Strip     Sig: USE 1 STRIP TO CHECK GLUCOSE 3 TO 5 TIMES DAILY BEFORE MEALS AS INSTRUCTED     Dispense:  50 strip     Refill:  11   ? tamsulosin (FLOMAX) 0.4 MG PO Capsule     Sig: Take 1 capsule by mouth daily.     Dispense:  90 capsule     Refill:  3   ? metoprolol succinate (TOPROL-XL) 25 MG PO Tablet Extended Release 24 Hour     Sig: Take 1 tablet by mouth 2 times daily.     Dispense:  180 tablet     Refill:  1   ? benzonatate (TESSALON) 200 MG PO Capsule     Sig: TAKE ONE CAPSULE BY MOUTH THREE TIMES DAILY AS NEEDED     Dispense:  45 capsule     Refill:  0     Please consider 90 day supplies to promote better adherence   ? sildenafil (VIAGRA) 100 MG PO Tablet     Sig: Take 1 tablet by mouth as needed for erectile dysfunction.     Dispense:  5 tablet     Refill:  3     No orders of the following type(s) were placed in this encounter: Procedures      Health Maintenance was reviewed. The patient's HM Topic list was:                                            Health Maintenance   Topic Date Due   ? USPSTF Hepatitis C Screening  06/08/2017 (Originally Oct 02, 1945)   ? Urine Protein  08/03/2016   ? Hemoglobin A1C  08/25/2016   ? Lipid Profile  11/10/2016   ? Diabetic Eye Exam  02/10/2017   ? Creatinine  05/25/2017   ? Basic Metabolic Panel  05/25/2017   ? Diabetic Foot Exam  06/25/2017   ? Preventive Wellness Visit  07/05/2017   ? Colon Cancer Screening  11/08/2022   ? DTaP,Tdap,and Td Vaccines (2 - Td) 01/17/2024   ? Influenza Vaccine  Addressed   ? Pneumovax / Prevnar  Excluded   ? Zoster Vaccine  Excluded           HM    PSA1/16  PROSTATE  COLONOSC    ADV DIRY    FLU10/16;11/17d  PRENV 6/16  PNEUMOV 8/60F  ZOSTAV 6/16r    CBC5/18  CMP  LIPIDS  TSH  B12 1/16  VIT D  URINE DIP    HBA1C 5/16;9/17  FEET 5/16 podiatry fu  OPHTH10/16r;7/17  URINE MICROALB endo     EKG 4/16  ECHO 2016  STRESS TEST p    SEG DOPPLER 8/16    CARDIO FU

## 2016-07-05 NOTE — Progress Notes
?   LORazepam (ATIVAN) 1 MG Tablet 1 pill hs pr n insomnia   ? losartan-hydroCHLOROthiazide (HYZAAR) 100-25 MG Tablet Take 50/12.5 mg (1/2 tablet) by mouth 2 times daily.   ? metoprolol tartrate (LOPRESSOR) 100 MG Tablet Take 50 mg by mouth 2 times daily.   ? omeprazole (PriLOSEC) 40 MG Capsule Delayed Release Take 1 capsule by mouth daily.   ? ONE TOUCH ULTRA 2 W/DEVICE Kit Use as instructed.   ? [DISCONTINUED] sildenafil (VIAGRA) 100 MG tablet Take 1 Tablet by mouth as needed for erectile dysfunction.     No current facility-administered medications on file prior to visit.      Allergies   Allergen Reactions   ? Metformin Other (See Comments)     Unspecified   ? Nsaids Other (See Comments)     ?Azotemia       Health Risk Assessment  General Care Management - Patient Level       Assessment completed with:  patient   Living arrangement:  spouse   Support system:  spouse, family, friends, children, faith based   Family conflict:  No   Type of residence:  private residence   Equipment used at home:  none   Communication device:  No   Financial problems:  Yes   Transportation issues:  No   Transportation means:  accessible car, regular car   Bed or wheelchair confined:  No   Diet:  regular   Inadequate nutrition:  No   Exercise:  currently not exercising   Inadequate activity/exercise:  Yes   Medication adherence problem:  No   Experiencing side effects from current medications:  Yes (Comment: sweat at night)   History of fall(s) in last 6 months:  Yes   Difficulty keeping appointments:  Yes   Family aware of the patient's advance care planning wishes:  Yes   Religious or spiritual beliefs that impact treatment:  No   Chronic pain:  Yes   Location of chronic pain:  neck,    Chronic pain timing:  intermittent   Chronic pain severity:  5   Limitation of routine activities due to chronic pain:  mild          Functional Ability Assessment 07/05/2016   Eating Feeds self completely.

## 2016-07-23 ENCOUNTER — Encounter: Attending: Cardiovascular Disease | Primary: Family Medicine

## 2016-07-28 DIAGNOSIS — I25118 Atherosclerotic heart disease of native coronary artery with other forms of angina pectoris: Principal | ICD-10-CM

## 2016-07-29 MED ORDER — CLOPIDOGREL BISULFATE 75 MG PO TABS
3 refills | Status: CP
Start: 2016-07-29 — End: 2017-11-22

## 2016-07-30 ENCOUNTER — Encounter: Attending: Podiatrist | Primary: Family Medicine

## 2016-08-02 ENCOUNTER — Encounter: Attending: Cardiovascular Disease | Primary: Family Medicine

## 2016-08-02 ENCOUNTER — Ambulatory Visit: Attending: Family Medicine | Primary: Family Medicine

## 2016-08-02 DIAGNOSIS — M542 Cervicalgia: Secondary | ICD-10-CM

## 2016-08-02 DIAGNOSIS — Z794 Long term (current) use of insulin: Secondary | ICD-10-CM

## 2016-08-02 DIAGNOSIS — N401 Enlarged prostate with lower urinary tract symptoms: Secondary | ICD-10-CM

## 2016-08-02 DIAGNOSIS — I10 Essential (primary) hypertension: Secondary | ICD-10-CM

## 2016-08-02 DIAGNOSIS — Z6828 Body mass index (BMI) 28.0-28.9, adult: Secondary | ICD-10-CM

## 2016-08-02 DIAGNOSIS — E119 Type 2 diabetes mellitus without complications: Principal | ICD-10-CM

## 2016-08-02 DIAGNOSIS — E1149 Type 2 diabetes mellitus with other diabetic neurological complication: Secondary | ICD-10-CM

## 2016-08-02 DIAGNOSIS — R51 Headache: Secondary | ICD-10-CM

## 2016-08-02 DIAGNOSIS — M4802 Spinal stenosis, cervical region: Secondary | ICD-10-CM

## 2016-08-02 DIAGNOSIS — M25532 Pain in left wrist: Secondary | ICD-10-CM

## 2016-08-02 DIAGNOSIS — I25810 Atherosclerosis of coronary artery bypass graft(s) without angina pectoris: Principal | ICD-10-CM

## 2016-08-02 DIAGNOSIS — C801 Malignant (primary) neoplasm, unspecified: Secondary | ICD-10-CM

## 2016-08-02 DIAGNOSIS — E1140 Type 2 diabetes mellitus with diabetic neuropathy, unspecified: Secondary | ICD-10-CM

## 2016-08-02 DIAGNOSIS — I2510 Atherosclerotic heart disease of native coronary artery without angina pectoris: Secondary | ICD-10-CM

## 2016-08-02 DIAGNOSIS — G4700 Insomnia, unspecified: Secondary | ICD-10-CM

## 2016-08-02 DIAGNOSIS — N189 Chronic kidney disease, unspecified: Secondary | ICD-10-CM

## 2016-08-02 DIAGNOSIS — R351 Nocturia: Secondary | ICD-10-CM

## 2016-08-02 DIAGNOSIS — M25531 Pain in right wrist: Secondary | ICD-10-CM

## 2016-08-02 DIAGNOSIS — E1142 Type 2 diabetes mellitus with diabetic polyneuropathy: Secondary | ICD-10-CM

## 2016-08-02 DIAGNOSIS — N183 Chronic kidney disease, stage 3 (moderate): Secondary | ICD-10-CM

## 2016-08-02 MED ORDER — HYDROCODONE-ACETAMINOPHEN 5-325 MG PO TABS
1 | ORAL_TABLET | Freq: Four times a day (QID) | ORAL | 0 refills | Status: CP | PRN
Start: 2016-08-02 — End: 2016-08-03

## 2016-08-02 MED ORDER — GABAPENTIN 300 MG PO CAPS
300 mg | Freq: Two times a day (BID) | ORAL | 3 refills | Status: CP
Start: 2016-08-02 — End: 2016-09-23

## 2016-08-02 MED ORDER — HYDROCODONE-ACETAMINOPHEN 5-325 MG PO TABS
1 | ORAL_TABLET | Freq: Four times a day (QID) | ORAL | 0 refills | Status: CP | PRN
Start: 2016-08-02 — End: 2016-10-05

## 2016-08-02 MED ORDER — LORAZEPAM 1 MG PO TABS
1 refills | Status: CP
Start: 2016-08-02 — End: 2016-08-12

## 2016-08-02 MED ORDER — DICLOFENAC SODIUM 1 % TD GEL
2 g | Freq: Four times a day (QID) | TOPICAL | 6 refills | Status: CP
Start: 2016-08-02 — End: 2018-05-03

## 2016-08-02 MED ORDER — LOSARTAN POTASSIUM-HCTZ 100-25 MG PO TABS
ORAL_TABLET | 3 refills | Status: CP
Start: 2016-08-02 — End: 2017-11-22

## 2016-08-02 MED ORDER — TAMSULOSIN HCL 0.4 MG PO CAPS
.4 mg | Freq: Every day | ORAL | 3 refills | Status: CP
Start: 2016-08-02 — End: 2017-11-22

## 2016-08-02 MED ORDER — LIDOCAINE-PRILOCAINE 2.5-2.5 % EX CREA
5 refills | Status: CP
Start: 2016-08-02 — End: 2016-08-19

## 2016-08-02 NOTE — Progress Notes
?   Pneumovax / Prevnar  Completed   ? Zoster Vaccine  Excluded           HM    PSA1/16  PROSTATE  COLONOSC    ADV DIRY    FLU10/16;11/17d  PRENV 6/16  PNEUMOV 8/46F  ZOSTAV 6/16r    CBC5/18  CMP  LIPIDS  TSH  B12 1/16  VIT D  URINE DIP    HBA1C 5/16;9/17  FEET 5/16 podiatry fu  OPHTH10/16r;7/17  URINE MICROALB endo     EKG 4/16  ECHO 2016  STRESS TEST p    SEG DOPPLER 8/16    CARDIO FU  ENDOCRINOL FU DR CHEHADE -  PAIN MNG FU  NUTRITIONIST 8/16R    PAIN CONTRACT  URINE DOA

## 2016-08-02 NOTE — Progress Notes
Eyes: Pupils are equal, round, and reactive to light.   Cardiovascular: Normal rate, regular rhythm and normal heart sounds.  Exam reveals no gallop and no friction rub.    No murmur heard.  Pulmonary/Chest: Effort normal and breath sounds normal. No respiratory distress. He has no wheezes. He has no rales.   Abdominal: Soft. There is no tenderness.        Assessment:       ICD-10-CM ICD-9-CM    1. Coronary artery disease involving coronary bypass graft of native heart without angina pectoris I25.810 414.05    2. Cervical spinal stenosis M48.02 723.0    3. Chronic nonintractable headache, unspecified headache type R51 784.0    4. Stage 3 chronic kidney disease N18.3 585.3    5. Type 2 diabetes mellitus without complication, without long-term current use of insulin E11.9 250.00           Plan:   Patient is here for follow up of chronic medical conditions and is currently using the medications listed below.  The current medical conditions are mostly stable.     Please see the Review of symtoms below.  Rev notes   rev labs   rev specialties notes   rf meds   cont current meds   fu cardiol as sch  To er if cp  Fu 2 months  Rx voltaren gel for arthralgias    No orders of the following type(s) were placed in this encounter: Medications.     No orders of the following type(s) were placed in this encounter: Procedures      Health Maintenance was reviewed. The patient's HM Topic list was:                                            Health Maintenance   Topic Date Due   ? USPSTF Hepatitis C Screening  06/08/2017 (Originally 02-27-1946)   ? Urine Microalbumin  08/03/2016   ? Hemoglobin A1C  08/25/2016   ? Lipid Profile  11/10/2016   ? Diabetic Eye Exam  02/10/2017   ? Creatinine  05/25/2017   ? Basic Metabolic Panel  05/25/2017   ? Diabetic Foot Exam  06/25/2017   ? Preventive Wellness Visit  07/05/2017   ? Colon Cancer Screening  11/08/2022   ? DTaP,Tdap,and Td Vaccines (2 - Td) 01/17/2024   ? Influenza Vaccine  Addressed

## 2016-08-02 NOTE — Progress Notes
?   clopidogrel (PLAVIX) 75 MG PO Tablet TAKE ONE TABLET BY MOUTH ONCE DAILY   ? clopidogrel (PLAVIX) 75 MG Tablet Take 1 tablet by mouth daily.   ? gabapentin (NEURONTIN) 300 MG Capsule Take 1 capsule by mouth 2 times daily.   ? glucose blood (ONE TOUCH ULTRA TEST) VI Strip USE 1 STRIP TO CHECK GLUCOSE 3 TO 5 TIMES DAILY BEFORE MEALS AS INSTRUCTED   ? HYDROcodone-acetaminophen (NORCO) 5-325 MG Tablet Take 1 tablet by mouth every 6 hours as needed for pain. Earliest Fill Date: 06/25/16   ? insulin aspart (NovoLOG) vial 100 units daily via insulin pump   ? insulin glargine (LANTUS SOLOSTAR) 100 UNIT/ML Solution Pen-injector Inject 25 Units into the skin nightly at bedtime.   ? lidocaine-prilocaine (EMLA) 2.5-2.5 % EX Cream Apply 1 g topically to the bilateral feet   ? LORazepam (ATIVAN) 1 MG Tablet 1 pill hs pr n insomnia   ? losartan-hydroCHLOROthiazide (HYZAAR) 100-25 MG Tablet Take 50/12.5 mg (1/2 tablet) by mouth 2 times daily.   ? metoprolol succinate (TOPROL-XL) 25 MG PO Tablet Extended Release 24 Hour Take 1 tablet by mouth 2 times daily.   ? metoprolol tartrate (LOPRESSOR) 100 MG Tablet Take 50 mg by mouth 2 times daily.   ? omeprazole (PriLOSEC) 40 MG Capsule Delayed Release Take 1 capsule by mouth daily.   ? ONE TOUCH ULTRA 2 W/DEVICE Kit Use as instructed.   ? sildenafil (VIAGRA) 100 MG PO Tablet Take 1 tablet by mouth as needed for erectile dysfunction.   ? tamsulosin (FLOMAX) 0.4 MG PO Capsule Take 1 capsule by mouth daily.     No current facility-administered medications on file prior to visit.      Allergies   Allergen Reactions   ? Metformin Other (See Comments)     Unspecified   ? Nsaids Other (See Comments)     ?Azotemia         Review of Systems  Review of Systems   All other systems reviewed and are negative.          Objective:       Physical Exam   Constitutional: He appears well-developed and well-nourished. No distress.   HENT:   Head: Normocephalic.

## 2016-08-02 NOTE — Progress Notes
Subjective:   Gary Walters is a 71 y.o. male being seen today for No chief complaint on file.       HPI Patient is here for follow up of chronic medical conditions and is currently using the medications listed below.  The current medical conditions are mostly stable.     Please see the Review of symtoms below.   h/o cad cervicalgia cri dm   All cond stable no cp sob apbain  Needs rf meds  Seen by endo and ortho  [place] and needs stress test  Was rescheduled with cardil but will try to be seen today  Currently feeling well no cp sob     Past Medical History:   Diagnosis Date   ? CAD (coronary artery disease)    ? Cancer    ? CKD (chronic kidney disease) 08/20/2014   ? Diabetes mellitus    ? Hypertension      Past Surgical History:   Procedure Laterality Date   ? CARDIAC CATHERIZATION     ? CERVICAL FUSION     ? CORONARY ANGIOPLASTY     ? CORONARY ARTERY BYPASS GRAFT       Family History   Problem Relation Age of Onset   ? Cancer Mother    ? Diabetes Father    ? Blindness Father    ? Cancer Sister    ? Cancer Brother      Social History     Social History   ? Marital status: Married     Spouse name: N/A   ? Number of children: N/A   ? Years of education: N/A     Occupational History   ? Not on file.     Social History Main Topics   ? Smoking status: Never Smoker   ? Smokeless tobacco: Never Used   ? Alcohol use No   ? Drug use: No   ? Sexual activity: Yes     Partners: Female     Other Topics Concern   ? Not on file     Social History Narrative     Current Outpatient Prescriptions on File Prior to Visit   Medication Sig   ? amLODIPine (NORVASC) 10 MG Tablet Take 5 mg (1/2 tablet) by mouth daily.   ? atorvastatin (LIPITOR) 40 MG Tablet TAKE ONE TABLET BY MOUTH ONCE DAILY   ? benzonatate (TESSALON) 200 MG PO Capsule TAKE ONE CAPSULE BY MOUTH THREE TIMES DAILY AS NEEDED   ? cetirizine (ZyrTEC) 10 MG Tablet Take 5 mg (1/2 tablet) by mouth daily.

## 2016-08-06 ENCOUNTER — Encounter: Attending: Cardiovascular Disease | Primary: Family Medicine

## 2016-08-11 ENCOUNTER — Ambulatory Visit: Attending: Orthopaedic Surgery | Primary: Family Medicine

## 2016-08-11 DIAGNOSIS — I10 Essential (primary) hypertension: Secondary | ICD-10-CM

## 2016-08-11 DIAGNOSIS — C801 Malignant (primary) neoplasm, unspecified: Secondary | ICD-10-CM

## 2016-08-11 DIAGNOSIS — I2510 Atherosclerotic heart disease of native coronary artery without angina pectoris: Secondary | ICD-10-CM

## 2016-08-11 DIAGNOSIS — M67911 Unspecified disorder of synovium and tendon, right shoulder: Principal | ICD-10-CM

## 2016-08-11 DIAGNOSIS — E119 Type 2 diabetes mellitus without complications: Principal | ICD-10-CM

## 2016-08-11 DIAGNOSIS — N189 Chronic kidney disease, unspecified: Secondary | ICD-10-CM

## 2016-08-12 NOTE — Progress Notes
Teaching/Attestation Statement  I saw and evaluated the patient.  I reviewed the Resident note and agree with the assessment and plan. See Resident's note for details.

## 2016-08-16 ENCOUNTER — Ambulatory Visit: Attending: Cardiovascular Disease | Primary: Family Medicine

## 2016-08-16 DIAGNOSIS — I5189 Other ill-defined heart diseases: Secondary | ICD-10-CM

## 2016-08-16 DIAGNOSIS — I10 Essential (primary) hypertension: Secondary | ICD-10-CM

## 2016-08-16 DIAGNOSIS — E119 Type 2 diabetes mellitus without complications: Principal | ICD-10-CM

## 2016-08-16 DIAGNOSIS — I25810 Atherosclerosis of coronary artery bypass graft(s) without angina pectoris: Principal | ICD-10-CM

## 2016-08-16 DIAGNOSIS — N189 Chronic kidney disease, unspecified: Secondary | ICD-10-CM

## 2016-08-16 DIAGNOSIS — F439 Reaction to severe stress, unspecified: Secondary | ICD-10-CM

## 2016-08-16 DIAGNOSIS — C801 Malignant (primary) neoplasm, unspecified: Secondary | ICD-10-CM

## 2016-08-16 DIAGNOSIS — I2510 Atherosclerotic heart disease of native coronary artery without angina pectoris: Secondary | ICD-10-CM

## 2016-08-16 MED ORDER — CLONIDINE HCL 0.1 MG PO TABS
0 refills | Status: CP
Start: 2016-08-16 — End: 2016-10-13

## 2016-08-16 MED ORDER — AMLODIPINE BESYLATE 5 MG PO TABS
2.5 mg | Freq: Two times a day (BID) | ORAL
Start: 2016-08-16 — End: 2016-08-16

## 2016-08-16 MED ORDER — AMLODIPINE BESYLATE 5 MG PO TABS
5 mg | Freq: Two times a day (BID) | ORAL | 3 refills | Status: CP
Start: 2016-08-16 — End: 2016-10-10

## 2016-08-18 NOTE — Progress Notes
Preparation: Patient was prepped and draped in the usual sterile fashion.  Local anesthesia used: yes    Anesthesia:  Local anesthesia used: yes  Local Anesthetic: lidocaine 1% without epinephrine and bupivacaine 0.25% without epinephrine    Sedation:  Patient sedated: no  Patient tolerance: Patient tolerated the procedure well with no immediate complications  Comments: R Shoulder SA joint injection           Dr Hetty ElyBrunelli spent substantial face to face time with patient.

## 2016-08-18 NOTE — Progress Notes
?   clopidogrel (PLAVIX) 75 MG PO Tablet TAKE ONE TABLET BY MOUTH ONCE DAILY 90 tablet 3   ? clopidogrel (PLAVIX) 75 MG Tablet Take 1 tablet by mouth daily. 90 tablet 0   ? diclofenac (VOLTAREN GEL) 1 % TD Gel Apply 2 g topically 4 times daily. **Measure dose using enclosed dose card** 100 g 6   ? gabapentin (NEURONTIN) 300 MG PO Capsule Take 1 capsule by mouth 2 times daily. 180 capsule 3   ? glucose blood (ONE TOUCH ULTRA TEST) VI Strip USE 1 STRIP TO CHECK GLUCOSE 3 TO 5 TIMES DAILY BEFORE MEALS AS INSTRUCTED 50 strip 11   ? [START ON 09/02/2016] HYDROcodone-acetaminophen (NORCO) 5-325 MG PO Tablet Take 1 tablet by mouth every 6 hours as needed for pain. Earliest Fill Date: 09/02/16 120 tablet 0   ? insulin aspart (NovoLOG) vial 100 units daily via insulin pump 9 vial 4   ? lidocaine-prilocaine (EMLA) 2.5-2.5 % EX Cream Apply 1 g topically to the bilateral feet 1 Bottle 5   ? losartan-hydroCHLOROthiazide (HYZAAR) 100-25 MG PO Tablet Take 50/12.5 mg (1/2 tablet) by mouth 2 times daily. 90 tablet 3   ? metoprolol succinate (TOPROL-XL) 25 MG PO Tablet Extended Release 24 Hour Take 1 tablet by mouth 2 times daily. 180 tablet 1   ? omeprazole (PriLOSEC) 40 MG Capsule Delayed Release Take 1 capsule by mouth daily. 90 capsule 3   ? ONE TOUCH ULTRA 2 W/DEVICE Kit Use as instructed. 1 kit 1   ? sildenafil (VIAGRA) 100 MG PO Tablet Take 1 tablet by mouth as needed for erectile dysfunction. 5 tablet 3   ? tamsulosin (FLOMAX) 0.4 MG PO Capsule Take 1 capsule by mouth daily. 90 capsule 3     No current facility-administered medications for this visit.        Active Problems:  Patient Active Problem List   Diagnosis   ? Diabetes mellitus   ? BPH (benign prostatic hyperplasia)   ? Esophageal dysmotility   ? History of noncompliance   ? Unspecified cataract   ? Chronic headaches   ? Vitamin B12 deficiency   ? GERD (gastroesophageal reflux disease)   ? Labile essential hypertension   ? Dyslipidemia   ? Erectile dysfunction

## 2016-08-18 NOTE — Progress Notes
?   History of cutaneous melanoma   ? Status post cervical spinal fusion   ? Hammer toe   ? Plantar fibromatosis   ? Osteoarthritis of the knees   ? CAD (coronary artery disease)   ? History of 3V CABG in 2015   ? History of myocardial infarction in 2015   ? CKD (chronic kidney disease)   ? Cervical spinal stenosis   ? History of shingles   ? Status post insulin pump placement   ? DOE (dyspnea on exertion)   ? Right rotator cuff tendinopathy   ? Allergic rhinitis   ? Diastolic dysfunction       PMH:  Past Medical History:   Diagnosis Date   ? CAD (coronary artery disease)    ? Cancer    ? CKD (chronic kidney disease) 08/20/2014   ? Diabetes mellitus    ? Hypertension        PSH:  Past Surgical History:   Procedure Laterality Date   ? CARDIAC CATHERIZATION     ? CERVICAL FUSION     ? CORONARY ANGIOPLASTY     ? CORONARY ARTERY BYPASS GRAFT         Social Hx:  Social History     Social History   ? Marital status: Married     Spouse name: N/A   ? Number of children: N/A   ? Years of education: N/A     Occupational History   ? Not on file.     Social History Main Topics   ? Smoking status: Never Smoker   ? Smokeless tobacco: Never Used   ? Alcohol use No   ? Drug use: No   ? Sexual activity: Yes     Partners: Female     Other Topics Concern   ? Not on file     Social History Narrative       Family Hx:  Family History   Problem Relation Age of Onset   ? Cancer Mother    ? Diabetes Father    ? Blindness Father    ? Cancer Sister    ? Cancer Brother        ROS:   Constitutional: denies fever, chills, or night sweats  HEENT: denies headaches, visual disturbance, or changes in hearing    Vital Signs:     VITAL SIGNS (all recorded)      Clinic Vitals       08/11/16 1505             Amb Encounter Vitals    Weight 88 kg (194 lb)    -CY at 08/11/16 1508       Height 1.778 m (5\' 10" )    -CY at 08/11/16 1508       BMI (Calculated) 27.89    -CY at 08/11/16 1508       BSA (Calculated - sq m) 2.08    -CY at 08/11/16 1508

## 2016-08-18 NOTE — Progress Notes
Department of Orthopedics   Established Patient Visit        Assessment:     ICD-10-CM ICD-9-CM   1. Right rotator cuff tendinopathy M67.911 727.9         Plan:   No orders of the following type(s) were placed in this encounter: Procedures    No orders of the following type(s) were placed in this encounter: Medications.       Rt shoulder steroid injection today, he will monitor his blood sugars.   F/U 8 weeks for repeat exam  Will order MRI R shoulder at next visit if patient continues to have pain.       Referring Physician/Primary Care Physician:  Gearlean AlfHadzic, [name], MD    Chief Complaint/Reason for Return:  Chief Complaint   Patient presents with   ? Shoulder Pain     Right         HPI:  Gary Walters is a 71 yo RHD male presents to clinic for eval of Rt arm pain, onset about 2 years ago without injury. Last seen in clinic on 02/02/16 . Right upper arm pain radiates down the arm. Pain worse with overhead activities.   Has tried activity modification, ice with no relief. Heat provides some relief. He cannot take NSAIDs, on plavix. Denies N/T. He had an injection in July 2017 that helped his symptoms until recently. He would like a repeat injection at this time.     He is a Product managerisherman/ farmer/ retired. He is IDDM, his FSBS have been running in the 130's.  He does not smoke.   Allergies:  Allergies   Allergen Reactions   ? Metformin Other (See Comments)     Unspecified   ? Nsaids Other (See Comments)     ?Azotemia       Current Meds:  Current Outpatient Prescriptions   Medication Sig Dispense Refill   ? amLODIPine (NORVASC) 10 MG Tablet Take 5 mg (1/2 tablet) by mouth daily.     ? atorvastatin (LIPITOR) 40 MG Tablet TAKE ONE TABLET BY MOUTH ONCE DAILY 90 tablet 3   ? benzonatate (TESSALON) 200 MG PO Capsule TAKE ONE CAPSULE BY MOUTH THREE TIMES DAILY AS NEEDED 45 capsule 0   ? cetirizine (ZyrTEC) 10 MG Tablet Take 5 mg (1/2 tablet) by mouth daily. 90 tablet 1

## 2016-08-18 NOTE — Progress Notes
BP 133/70    -CY at 08/11/16 1508       BP Location Left upper arm    -CY at 08/11/16 1508       Position Sitting    -CY at 08/11/16 1508       Pulse 67    -CY at 08/11/16 1508       Temp 36.1 ?C (97 ?F)    -CY at 08/11/16 1508       Temperature Source Oral    -CY at 08/11/16 1508       O2 Saturation 97 %    -CY at 08/11/16 1508       Pain Score SEVEN    -CY at 08/11/16 1512         User Key  (r) = Recorded By, (t) = Taken By, (c) = Cosigned By    Initials Name Effective Dates    CY You, Briarcliffhanrath, MA 10/28/15 -             Physical Exam:  SHOULDER    Right  General Appearance: normal muscular tone [id number]  Incision: None  Scapular Motion: abnormal  AROM (left): FF 140 ABD 140 Ext Rot@0  50  Int Rot@0  lumbar   AROM (right): FF 140 ABD 120 Ext Rot@0  50  Int Rot@0  lumbar   PROM: FF 140 ABD 140 Ext Rot@0  50   Point tenderness: AC- GT- Post Cap+ Biceps+ Coracoid-    Lateral Impingement- Hawkins+ Neer- O'Brienequivocal Speed+ Spurling-  Pulses: Rad2+ Ulnar2+  Stability: Ant0 Post0  Muscle Strength: Jobe4/5 Ext rot5/5 Int Rot4/5 Tricep5/5 Bicep5/5 Grip5/5  Sensation: Radintact Ulnintact Medintact Axintact Muscintact          Test Conclusion:   Per Radiology: Xr Shoulder Right Complete 2 Or 3 Views    Result Date: 02/02/2016  Study: XR SHOULDER RIGHT COMPLETE 3 VIEWS History:71 years Male Right shoulder pain, unspecified chronicity COMPARISON: Dec 03, 2014     Findings/Impression: Negative for acute fracture or dislocation. Glenohumeral joint unremarkable. AC joint demonstrates very mild degenerative changes. Median sternotomy wires noted. Read By Christianne Dolin- Dinesh Rao  Electronically Verified By - Christianne Dolininesh Rao  Released Date Time - 02/02/2016 3:16 PM  Resident -     MRI R shoulder (Jan 2017): RTC tendinopathy without discrete tear     Procedure:  Major Joint Aspiration/Injection  Date/Time: 08/11/2016 4:23 PM  Performed by: Garrison ColumbusHANKINS, DAVID A  Authorized by: Garrison ColumbusHANKINS, DAVID A

## 2016-08-18 NOTE — Progress Notes
Preparation: Patient was prepped and draped in the usual sterile fashion.  Local anesthesia used: yes    Anesthesia:  Local anesthesia used: yes  Local Anesthetic: lidocaine 1% without epinephrine and bupivacaine 0.25% without epinephrine    Sedation:  Patient sedated: no  Patient tolerance: Patient tolerated the procedure well with no immediate complications  Comments: R Shoulder SA joint injection with 1cc 40mg  kenalog           Dr Hetty ElyBrunelli spent substantial face to face time with patient.

## 2016-08-19 DIAGNOSIS — E1142 Type 2 diabetes mellitus with diabetic polyneuropathy: Secondary | ICD-10-CM

## 2016-08-19 MED ORDER — LIDOCAINE-PRILOCAINE 2.5-2.5 % EX CREA
5 refills | Status: CP
Start: 2016-08-19 — End: 2017-01-12

## 2016-08-27 DIAGNOSIS — N189 Chronic kidney disease, unspecified: Secondary | ICD-10-CM

## 2016-08-27 DIAGNOSIS — I2510 Atherosclerotic heart disease of native coronary artery without angina pectoris: Secondary | ICD-10-CM

## 2016-08-27 DIAGNOSIS — E119 Type 2 diabetes mellitus without complications: Principal | ICD-10-CM

## 2016-08-27 DIAGNOSIS — I10 Essential (primary) hypertension: Secondary | ICD-10-CM

## 2016-08-27 DIAGNOSIS — C801 Malignant (primary) neoplasm, unspecified: Secondary | ICD-10-CM

## 2016-08-27 NOTE — Progress Notes
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 He describes acute stress and severe hypertension over the past few weeks. However, he does not describe fatigue, syncope, lightheadedness, angina, palpitations, undue DOE, orthopnea, PND, LE edema, amaurosis fugax, or claudication.     ROS: Positive for chronic pain. All other systems reviewed and negative.     MEDICAL HISTORY: As stated above.     PERTINENT MEDICATIONS:  1. CLOPIDOGREL 75 mg daily  2. METOPROLOL SUCCINATE 25 mg bid  3. LOSARTAN/HCTZ 50/12.5 mg bid  4. AMLODIPINE 2.5 mg bid  5. ATORVASTATIN 40 mg daily     ALLERGIES: NO KNOWN DRUG ALLERGIES.     FAMILY HISTORY: Negative for premature CAD and cardiomyopathy.     SOCIAL HISTORY: The patient is a nonsmoker and he does not describe alcohol use.     PE:  VS: T Afebrile, P 67, BP 147/94, R 14, Wt 188 pounds   GEN: Well-developed male, no distress  HEENT: Conjunctivae pink and moist, no scleral icterus  NECK: No JVD or thyromegaly, carotid pulses 2+ and with normal upstrokes, no carotid bruits  CVS: Pulse regular in rhythm, S1 and S2 normal, 1/6 early-peaking ESM at the RUSB, no rubs  RESP: Clear lung fields, resonant on percussion  EXT: No cyanosis or edema, warm peripheries, pedal pulses 1+    DIAGNOSTIC DATA:  1. EKG performed on 09/18/2015 revealed sinus bradycardia at 59 bpm and no significant abnormalities.

## 2016-08-27 NOTE — Progress Notes
2. TTE performed in February 2017 revealed normal LV size and systolic function (LVEF of 60-65%), diastolic dysfunction with elevated LV filling pressure, and no evidence of significant valvular heart disease or PH.  3. MPS performed in February 2017 revealed a fixed apical defect.   4. LHC performed in November 2016 revealed 30% distal LMCA stenosis, diffuse proximal and mid LAD disease with up to 80% stenosis, 90% ostial D1 stenosis, 95% proximal D2 stenosis, 80% proximal LCx stenosis, diffuse mid and distal RCA disease with up to 80% stenosis, a patent LIMA graft to the LAD, a patent SVG to OM1 with 90% stenosis at the anastomosis, a patent SVG to the PDA with 70% distal PDA stenosis, and a LVEDP of 18 mmHg. PCI of the SVG to OM1 and the distal PDA was subsequently performed with implantation of Xience 2.5 x 15 mm and Resolute Integrity 2.25 x 18 mm drug-eluting stents.  5. LE segmental doppler ultrasound performed in September 2016 revealed no evidence of significant peripheral vascular disease.   6. Labs drawn in May 2016: LDL 87, HDL 44, triglycerides 73.  7. Labs drawn in October 2016: Hb 14.9, BUN 25, Cr 1.35, K 4.9, albumin 4, TSH 1.06.  8. Labs drawn in February 2017: Hb 13.3, BUN 15, Cr 1.14, K 3.4.    ASSESSMENT:  1. CAD without angina at this time  2. Diastolic dysfunction  3. Labile essential hypertension, which appears to be uncontrolled  4. Acute stress     RECOMMENDATIONS:  1. Increase AMLODIPINE to 5 mg bid  2. Start CLONIDINE 0.1 mg bid prn for severe hypertension  3. Continue medical therapy otherwise  4. RTC in 6 months plus prn    Woody SellerSIVA SURYADEVARA, MD

## 2016-09-01 ENCOUNTER — Encounter: Attending: "Endocrinology | Primary: Family Medicine

## 2016-09-01 MED ORDER — GLUCOSE BLOOD VI STRP
ORAL_STRIP | 11 refills | Status: CP
Start: 2016-09-01 — End: 2016-09-23

## 2016-09-22 ENCOUNTER — Ambulatory Visit: Attending: "Endocrinology | Primary: Family Medicine

## 2016-09-22 DIAGNOSIS — E7849 Other hyperlipidemia: Secondary | ICD-10-CM

## 2016-09-22 DIAGNOSIS — I10 Essential (primary) hypertension: Secondary | ICD-10-CM

## 2016-09-22 DIAGNOSIS — I2510 Atherosclerotic heart disease of native coronary artery without angina pectoris: Secondary | ICD-10-CM

## 2016-09-22 DIAGNOSIS — E119 Type 2 diabetes mellitus without complications: Principal | ICD-10-CM

## 2016-09-22 DIAGNOSIS — C801 Malignant (primary) neoplasm, unspecified: Secondary | ICD-10-CM

## 2016-09-22 DIAGNOSIS — M542 Cervicalgia: Secondary | ICD-10-CM

## 2016-09-22 DIAGNOSIS — I25810 Atherosclerosis of coronary artery bypass graft(s) without angina pectoris: Secondary | ICD-10-CM

## 2016-09-22 DIAGNOSIS — Z794 Long term (current) use of insulin: Secondary | ICD-10-CM

## 2016-09-22 DIAGNOSIS — N189 Chronic kidney disease, unspecified: Secondary | ICD-10-CM

## 2016-09-22 DIAGNOSIS — E1142 Type 2 diabetes mellitus with diabetic polyneuropathy: Principal | ICD-10-CM

## 2016-09-22 MED ORDER — GLUCOSE BLOOD VI STRP
ORAL_STRIP | 11 refills | Status: CP
Start: 2016-09-22 — End: 2016-10-05

## 2016-09-22 MED ORDER — GABAPENTIN 300 MG PO CAPS
300 mg | Freq: Three times a day (TID) | ORAL | 3 refills | Status: SS
Start: 2016-09-22 — End: 2016-11-16

## 2016-09-23 ENCOUNTER — Encounter: Attending: Family Medicine | Primary: Family Medicine

## 2016-10-01 MED ORDER — GLUCOSE BLOOD VI STRP
1 | ORAL_STRIP | Freq: Every day | 11 refills | Status: SS
Start: 2016-10-01 — End: 2016-11-16

## 2016-10-01 NOTE — Progress Notes
mouth every 6 hours as needed for pain. Earliest Fill Date: 09/02/16 09/02/16   Delton Coombes, MD   insulin aspart (NovoLOG) vial 100 units daily via insulin pump 11/14/15   Rosemary Holms, MD   lidocaine-prilocaine (EMLA) 2.5-2.5 % EX Cream Apply to feet once a day 08/19/16   Delton Coombes, MD   losartan-hydroCHLOROthiazide (HYZAAR) 100-25 MG PO Tablet Take 50/12.5 mg (1/2 tablet) by mouth 2 times daily. 08/02/16   Delton Coombes, MD   metoprolol succinate (TOPROL-XL) 25 MG PO Tablet Extended Release 24 Hour Take 1 tablet by mouth 2 times daily. 07/05/16   Delton Coombes, MD   omeprazole (PriLOSEC) 40 MG Capsule Delayed Release Take 1 capsule by mouth daily. 10/04/14   Minna Antis, ARNP   ONE TOUCH ULTRA 2 W/DEVICE Kit Use as instructed. 09/27/14   Rosemary Holms, MD   sildenafil (VIAGRA) 100 MG PO Tablet Take 1 tablet by mouth as needed for erectile dysfunction. 07/05/16   [name], Milas Gain, MD   tamsulosin (FLOMAX) 0.4 MG PO Capsule Take 1 capsule by mouth daily. 08/02/16   Delton Coombes, MD     Allergies   No Known Drug Allergies.    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  1-DM type 2, uncontrolled  2-ED  3-Hypertension  4-Hyperlipidemia.    [place]   Neck Surgery.  cabg     Family Hx   Paternal history of Diabetes Mellitus  Paternal history of Essential Hypertension  Family history of thyroid cancer?  no  Family history of thyroid disease? no     Documentation Encounter on 06/01/2016   Component Date Value   ? Colonoscopy Comment 11/07/2012 Report available under Encounter Tab, DOS 01/10/13, done at [facility] by DR. Rose Fillers, recommended 51yrfollow up.     Office Visit on 03/31/2016   Component Date Value   ? Hemoglobin A1C 05/25/2016 8.8*   ? EAG (MG/DL) 05/25/2016 206    ? EAG (MMOL/L) 05/25/2016 11.4    ? Glucose 05/25/2016 154*   ? Urea Nitrogen 05/25/2016 30*   ? Creatinine 05/25/2016 1.42*   ? EGFR 05/25/2016 50*   ? Glom Filt Rate, Est Afri* 05/25/2016 58*   ? BUN/Creatinine Ratio 05/25/2016 21    ? Sodium 05/25/2016 140

## 2016-10-01 NOTE — Progress Notes
?   Potassium 05/25/2016 5.1    ? Chloride 05/25/2016 109    ? CARBON DIOXIDE 05/25/2016 25    ? Calcium 05/25/2016 9.4          Personal Hx   Denied Alcohol Use  Never A Smoker    ROS   Constitution: weight stable 189     no  fever chills or night sweat. Energy level good for stated age.  Eyes:no  vision problem.  Cardiovascular:  Denies any acute chest pain at rest or with exertion.  No lower extremities edema. No shortness of breath with minimal or moderate exertion.  Respiratory:  No  coughing hemoptysis or wheezing.  Gastrointestinal: No history of nausea, vomiting,  yes heartburn,  no diarrhea, constipation, or abdominal pain. + gerd   Neurological: No  memory problem +   feet numbness or burning sensation. Musculoskeletal: No muscles aching or joints aching.  Psychiatric:  No sign of depression, no  irritability or nervousness.  Endocrine: (as reviewed in HPI).  No heat or cold intolerance.  Genitourinary:  No dysuria or hematuria + nocturia   Hematologic:  No easy bruising.    Vital Signs     Vitals:    09/22/16 1446   BP: 104/66   Pulse: 72   Resp: 18   Weight: 85.7 kg (189 lb)   Height: 1.778 m (5\' 10" )     Physical Exam   Constitution:   1-Vital sign as reviewed in the chart   2-Appearance: Well nourished, Normocephalic.   Cardiovascular:   1-Heart auscultation: regular rhythm no appreciate murmur.   3-Lower extremities: no edema   Respiratory:   1-Respiratory effort: No labored breathing.   2-Auscultation: lungs are clear to auscultation bilaterally;no wheezing, rhonchi or rales.   Abdomen soft benign site of pump looks ok   Musculosketetal:   1-Gait & Station: Normal gait and station, gait is smooth and coordinated.   Psychiatric:   1-Orientation: Patient appears well oriented to place, person and time.   2-Mood & affect: Patient appears in normal mood & affect, no agitation, anxiety or blunt affect.    Assessment   1-DM type 2, uncontrolled  2-Hypertension  3-Hyperlipidemia  4 neuropathy.      Plan

## 2016-10-01 NOTE — Progress Notes
Chief Complaint   ? [name] is a 71   year old male here today for follow-up on type 2  diabetes  HPI   No labs  Has new pump need training and update     Type 2 DM    insulin regimen :  Basal rate 0.8  midnight till 7 am   Basal 1   Carb ratio 1/15    1 for ecvery 50 > 100   Off  METFORMIN       Accuchecks varies 130150 when bolusing not checking very often   occ 300 no lows lately       Reviewed insulin pump download -    Hypoglycemia requiring assistance no     Changing site every 3-4 days     A1c. 8.02 May 2016     Creat  1.42    On atorvastatin. LDL 54 in April 2017  Neuropathy - on gabapentin   Due for eye exam   HTN - on norvasc, metoprolol and losartan   .  Current Meds   Current Outpatient Medications    Medication Sig Start Date End Date Taking? Authorizing Provider   amLODIPine (NORVASC) 5 MG PO Tablet Take 1 tablet by mouth 2 times daily. 08/16/16   Malcolm MetroSuryadevara, Siva K, MD   atorvastatin (LIPITOR) 40 MG Tablet TAKE ONE TABLET BY MOUTH ONCE DAILY 06/02/16   [name], [name], MD   benzonatate (TESSALON) 200 MG PO Capsule TAKE ONE CAPSULE BY MOUTH THREE TIMES DAILY AS NEEDED 07/05/16   [name], [name], MD   cetirizine (ZyrTEC) 10 MG Tablet Take 5 mg (1/2 tablet) by mouth daily. 10/17/15   Malcolm MetroSuryadevara, Siva K, MD   cloNIDine (CATAPRES) 0.1 MG PO Tablet Take 1 tablet by mouth 2 times daily as needed for severe hypertension. 08/16/16   Malcolm MetroSuryadevara, Siva K, MD   clopidogrel (PLAVIX) 75 MG PO Tablet TAKE ONE TABLET BY MOUTH ONCE DAILY 07/29/16   Gearlean AlfHadzic, [name], MD   diclofenac (VOLTAREN GEL) 1 % TD Gel Apply 2 g topically 4 times daily. **Measure dose using enclosed dose card** 08/02/16   [name], [name], MD   gabapentin (NEURONTIN) 300 MG PO Capsule Take 1 capsule by mouth 2 times daily. 08/02/16   [name], [name], MD   glucose blood (ONE TOUCH ULTRA TEST) VI Strip USE 1 STRIP TO CHECK GLUCOSE 3 TO 5 TIMES DAILY BEFORE MEALS AS INSTRUCTED 09/01/16   [name], [name], MD   HYDROcodone-acetaminophen (NORCO) 5-325 MG PO Tablet Take 1 tablet by

## 2016-10-01 NOTE — Progress Notes
Downloaded pump and reviewed data   Cont   Basal rate 0.8  midnight till 7 am   Basal 1   Carb ratio 1/15    1 for ecvery 50 > 100   bolusing better not checking still see below reviewed         Orders Placed This Encounter   Procedures   ? Comprehensive Metabolic Panel   ? TSH   ? Hemoglobin A1c   ? Lipid Panel   ? CBC and Differential     Medication orders placed this encounter   Medications   ? glucose blood (ONE TOUCH ULTRA TEST) VI Strip     Sig: USE 1 STRIP TO CHECK GLUCOSE 3 TO 5 TIMES DAILY BEFORE MEALS AS INSTRUCTED     Dispense:  150 strip     Refill:  11   ? gabapentin (NEURONTIN) 300 MG PO Capsule     Sig: Take 1 capsule by mouth 3 times daily.     Dispense:  270 capsule     Refill:  3      Hypertension BP ok   Taking Metoprolol tartrate 50mg  BID   Taking losartan 50 mg daily  Still on norvac 5 bid - states that he taking only a "bite" of the dose, not twice a day  Has follow up with cardiology regarding reducing BP medications.     F/u 1  months

## 2016-10-05 ENCOUNTER — Ambulatory Visit: Attending: Family Medicine | Primary: Family Medicine

## 2016-10-05 DIAGNOSIS — N189 Chronic kidney disease, unspecified: Secondary | ICD-10-CM

## 2016-10-05 DIAGNOSIS — N183 Chronic kidney disease, stage 3 (moderate): Secondary | ICD-10-CM

## 2016-10-05 DIAGNOSIS — I25810 Atherosclerosis of coronary artery bypass graft(s) without angina pectoris: Principal | ICD-10-CM

## 2016-10-05 DIAGNOSIS — I10 Essential (primary) hypertension: Secondary | ICD-10-CM

## 2016-10-05 DIAGNOSIS — I2510 Atherosclerotic heart disease of native coronary artery without angina pectoris: Secondary | ICD-10-CM

## 2016-10-05 DIAGNOSIS — G4700 Insomnia, unspecified: Secondary | ICD-10-CM

## 2016-10-05 DIAGNOSIS — C801 Malignant (primary) neoplasm, unspecified: Secondary | ICD-10-CM

## 2016-10-05 DIAGNOSIS — E119 Type 2 diabetes mellitus without complications: Principal | ICD-10-CM

## 2016-10-05 DIAGNOSIS — M67911 Unspecified disorder of synovium and tendon, right shoulder: Principal | ICD-10-CM

## 2016-10-05 DIAGNOSIS — Z79899 Other long term (current) drug therapy: Secondary | ICD-10-CM

## 2016-10-05 DIAGNOSIS — M542 Cervicalgia: Secondary | ICD-10-CM

## 2016-10-05 DIAGNOSIS — N400 Enlarged prostate without lower urinary tract symptoms: Secondary | ICD-10-CM

## 2016-10-05 DIAGNOSIS — M4802 Spinal stenosis, cervical region: Secondary | ICD-10-CM

## 2016-10-05 DIAGNOSIS — Z6827 Body mass index (BMI) 27.0-27.9, adult: Secondary | ICD-10-CM

## 2016-10-05 MED ORDER — LORAZEPAM 1 MG PO TABS
1 refills | Status: CP
Start: 2016-10-05 — End: 2017-01-19

## 2016-10-05 MED ORDER — HYDROCODONE-ACETAMINOPHEN 5-325 MG PO TABS
1 | ORAL_TABLET | Freq: Four times a day (QID) | ORAL | 0 refills | Status: CP | PRN
Start: 2016-10-05 — End: 2016-11-22

## 2016-10-05 MED ORDER — HYDROCODONE-ACETAMINOPHEN 5-325 MG PO TABS
1 | ORAL_TABLET | Freq: Four times a day (QID) | ORAL | 0 refills | Status: CP | PRN
Start: 2016-10-05 — End: 2016-10-05

## 2016-10-05 NOTE — Progress Notes
?   Pneumovax / Prevnar  Completed   ? Zoster Vaccine  Excluded           HM    PSA1/16  PROSTATE  COLONOSC    ADV DIRY    FLU10/16;11/17d  PRENV 6/16  PNEUMOV 8/11F  ZOSTAV 6/16r    CBC5/18  CMP  LIPIDS  TSH  B12 1/16  VIT D  URINE DIP    HBA1C 5/16;9/17;3/18  FEET 5/16 podiatry fu  OPHTH10/16r;7/17  URINE MICROALB endo;3/18     EKG 4/16  ECHO 2016  STRESS TEST p    SEG DOPPLER 8/16    CARDIO FU  ENDOCRINOL FU DR CHEHADE -  PAIN [facility] FU  NUTRITIONIST 8/16R    PAIN CONTRACT  URINE DOA

## 2016-10-05 NOTE — Progress Notes
No murmur heard.  Pulmonary/Chest: Effort normal and breath sounds normal. No respiratory distress. He has no wheezes. He has no rales.   Abdominal: Soft. There is no tenderness.        Assessment:       ICD-10-CM ICD-9-CM    1. Coronary artery disease involving coronary bypass graft of native heart without angina pectoris I25.810 414.05    2. Benign prostatic hyperplasia, unspecified whether lower urinary tract symptoms present N40.0 600.00    3. Cervical spinal stenosis M48.02 723.0    4. Stage 3 chronic kidney disease N18.3 585.3    5. Type 2 diabetes mellitus without complication, without long-term current use of insulin E11.9 250.00    6. Labile essential hypertension I10 401.9    7. Right rotator cuff tendinopathy M67.911 727.9           Plan:   Patient advised to various Tx options and agreed to the following:  Continuation of current medications listed above,the importance of follow up visits.    Risk verses benefits of medication and treatments were discussed.  Patient voiced understanding.      Rev notes   rev labs   rf meds   cont current meds   change  Cardiol to [facility]  Fu endocrinol  Fu ortho  Urine microalb  Hba1c8.8  Fu 1 month    No orders of the following type(s) were placed in this encounter: Medications.     No orders of the following type(s) were placed in this encounter: Procedures      Health Maintenance was reviewed. The patient's HM Topic list was:                                            Health Maintenance   Topic Date Due   ? Urine Microalbumin  08/03/2016   ? Hemoglobin A1C  08/25/2016   ? USPSTF Hepatitis C Screening  06/08/2017 (Originally 1946-06-22)   ? Lipid Profile  11/10/2016   ? Diabetic Eye Exam  02/10/2017   ? Creatinine  05/25/2017   ? Basic Metabolic Panel  05/25/2017   ? Diabetic Foot Exam  06/25/2017   ? Preventive Wellness Visit  07/05/2017   ? Colon Cancer Screening  11/08/2022   ? DTaP,Tdap,and Td Vaccines (2 - Td) 01/17/2024   ? Influenza Vaccine  Addressed

## 2016-10-05 NOTE — Progress Notes
Subjective:   Gary Walters is a 71 y.o. male being seen today for No chief complaint on file.       HPI Patient is here for follow up of chronic medical conditions and is currently using the medications listed below.  The current medical conditions are mostly stable.     Please see the Review of symtoms below.   h/o cervicalgia cad dm htn   All cond stable no cp sob apbpain  Needs rf meds  W[facility] to [facility] cardiologist [facility]  He cannot wait till next appt with [facility] cardi[facility]    Past Medical History:   Diagnosis Date   ? CAD (coronary artery disease)    ? Cancer    ? CKD (chronic kidney disease) 08/20/2014   ? Diabetes mellitus    ? Hypertension      Past Surgical History:   Procedure Laterality Date   ? CARDIAC CATHERIZATION     ? CERVICAL FUSION     ? CORONARY ANGIOPLASTY     ? CORONARY ARTERY BYPASS GRAFT       Family History   Problem Relation Age of Onset   ? Cancer Mother    ? Diabetes Father    ? Blindness Father    ? Cancer Sister    ? Cancer Brother      Social History     Social History   ? Marital status: Married     Spouse name: N/A   ? Number of children: N/A   ? Years of education: N/A     Occupational History   ? Not on file.     Social History Main Topics   ? Smoking status: Never Smoker   ? Smokeless tobacco: Never Used   ? Alcohol use No   ? Drug use: No   ? Sexual activity: Yes     Partners: Female     Other Topics Concern   ? Not on file     Social History Narrative     Current Outpatient Prescriptions on File Prior to Visit   Medication Sig   ? amLODIPine (NORVASC) 5 MG PO Tablet Take 1 tablet by mouth 2 times daily.   ? atorvastatin (LIPITOR) 40 MG Tablet TAKE ONE TABLET BY MOUTH ONCE DAILY   ? benzonatate (TESSALON) 200 MG PO Capsule TAKE ONE CAPSULE BY MOUTH THREE TIMES DAILY AS NEEDED   ? cetirizine (ZyrTEC) 10 MG Tablet Take 5 mg (1/2 tablet) by mouth daily.   ? cloNIDine (CATAPRES) 0.1 MG PO Tablet Take 1 tablet by mouth 2 times daily as needed for severe hypertension.

## 2016-10-05 NOTE — Progress Notes
?   clopidogrel (PLAVIX) 75 MG PO Tablet TAKE ONE TABLET BY MOUTH ONCE DAILY   ? diclofenac (VOLTAREN GEL) 1 % TD Gel Apply 2 g topically 4 times daily. **Measure dose using enclosed dose card**   ? gabapentin (NEURONTIN) 300 MG PO Capsule Take 1 capsule by mouth 3 times daily.   ? glucose blood (BAYER CONTOUR TEST) VI Strip 1 strip as instructed 5 times daily.   ? glucose blood (ONE TOUCH ULTRA TEST) VI Strip USE 1 STRIP TO CHECK GLUCOSE 3 TO 5 TIMES DAILY BEFORE MEALS AS INSTRUCTED   ? HYDROcodone-acetaminophen (NORCO) 5-325 MG PO Tablet Take 1 tablet by mouth every 6 hours as needed for pain. Earliest Fill Date: 09/02/16   ? insulin aspart (NovoLOG) vial 100 units daily via insulin pump   ? lidocaine-prilocaine (EMLA) 2.5-2.5 % EX Cream Apply to feet once a day   ? losartan-hydroCHLOROthiazide (HYZAAR) 100-25 MG PO Tablet Take 50/12.5 mg (1/2 tablet) by mouth 2 times daily.   ? metoprolol succinate (TOPROL-XL) 25 MG PO Tablet Extended Release 24 Hour Take 1 tablet by mouth 2 times daily.   ? omeprazole (PriLOSEC) 40 MG Capsule Delayed Release Take 1 capsule by mouth daily.   ? ONE TOUCH ULTRA 2 W/DEVICE Kit Use as instructed.   ? sildenafil (VIAGRA) 100 MG PO Tablet Take 1 tablet by mouth as needed for erectile dysfunction.   ? tamsulosin (FLOMAX) 0.4 MG PO Capsule Take 1 capsule by mouth daily.     No current facility-administered medications on file prior to visit.      Allergies   Allergen Reactions   ? Metformin Other (See Comments)     Unspecified   ? Nsaids Other (See Comments)     ?Azotemia         Review of Systems  Review of Systems   All other systems reviewed and are negative.          Objective:       Physical Exam   Constitutional: He appears well-developed and well-nourished. No distress.   HENT:   Head: Normocephalic.   Eyes: Pupils are equal, round, and reactive to light.   Cardiovascular: Normal rate, regular rhythm and normal heart sounds.  Exam reveals no gallop and no friction rub.

## 2016-10-06 ENCOUNTER — Encounter: Attending: Orthopaedic Surgery | Primary: Family Medicine

## 2016-10-09 DIAGNOSIS — I10 Essential (primary) hypertension: Principal | ICD-10-CM

## 2016-10-10 MED ORDER — AMLODIPINE BESYLATE 10 MG PO TABS
3 refills | Status: CP
Start: 2016-10-10 — End: 2018-02-13

## 2016-10-13 DIAGNOSIS — I10 Essential (primary) hypertension: Principal | ICD-10-CM

## 2016-10-13 MED ORDER — CLONIDINE HCL 0.1 MG PO TABS
0 refills | Status: CP
Start: 2016-10-13 — End: 2017-10-20

## 2016-10-13 MED ORDER — CLONIDINE HCL 0.1 MG PO TABS
0 refills | Status: CN
Start: 2016-10-13 — End: ?

## 2016-10-13 NOTE — Telephone Encounter
Pt of Dr. Wess BottsSuryadevara   [facility]  Medicare     The pt called because of fatigue and hypertension - his BP was 175/111 earlier today. Also, he attempted to fill his prescription for clonidine but his pharmacist informed him that the prescription was already filled on 2/23. However, he cannot find the medication bottle.    He would like a new prescription sent to his pharmacy and a call back at [phone]. Thank you.

## 2016-10-13 NOTE — Telephone Encounter
I just sent a new prescription for clonidine to the patient's pharmacy. He should use this prn for severe hypertension as we discussed during his visit in January.    SKS

## 2016-10-13 NOTE — Telephone Encounter
Dr. Lenise HeraldSiva & Luther Parodyaitlin,    Can you please call the patient to discuss his BP and medication refill request?    Thank you,  Clydie BraunKaren

## 2016-10-13 NOTE — Telephone Encounter
Dr Lenise HeraldSiva & Luther Parodyaitlin,    Can you please call the patient to discuss his BP and medication request?    Thank you,  Clydie BraunKaren

## 2016-10-13 NOTE — Telephone Encounter
Gary Walters,    [facility] Wal[facility] the [facility]

## 2016-10-13 NOTE — Telephone Encounter
Pt of Dr Wess BottsSuryadevara   [facility]  Medic[facility]         Pt called and stated he had his blood pressure checked approx 10 min ago and was 175/111. Pt stated he has been feeling a little weak. Pt stated he tried to pick up rx cloNIDine (CATAPRES) 0.1 MG PO [facility]. Pt stated he is not able to locate the medication and will like to know if a new script could be written. Pt will like a call back at  [phone]. Thank you

## 2016-10-13 NOTE — Telephone Encounter
Called and advised pt he expressed understanding.

## 2016-10-26 ENCOUNTER — Inpatient Hospital Stay: Admit: 2016-10-26 | Discharge: 2016-10-27 | Primary: Family Medicine

## 2016-10-26 ENCOUNTER — Ambulatory Visit: Admit: 2016-10-26 | Discharge: 2016-10-27 | Attending: Podiatrist | Primary: Family Medicine

## 2016-10-26 DIAGNOSIS — Z7902 Long term (current) use of antithrombotics/antiplatelets: Secondary | ICD-10-CM

## 2016-10-26 DIAGNOSIS — M2042 Other hammer toe(s) (acquired), left foot: Secondary | ICD-10-CM

## 2016-10-26 DIAGNOSIS — I1310 Hypertensive heart and chronic kidney disease without heart failure, with stage 1 through stage 4 chronic kidney disease, or unspecified chronic kidney disease: Secondary | ICD-10-CM

## 2016-10-26 DIAGNOSIS — M67911 Unspecified disorder of synovium and tendon, right shoulder: Secondary | ICD-10-CM

## 2016-10-26 DIAGNOSIS — Z981 Arthrodesis status: Secondary | ICD-10-CM

## 2016-10-26 DIAGNOSIS — Z9861 Coronary angioplasty status: Secondary | ICD-10-CM

## 2016-10-26 DIAGNOSIS — E119 Type 2 diabetes mellitus without complications: Secondary | ICD-10-CM

## 2016-10-26 DIAGNOSIS — E1151 Type 2 diabetes mellitus with diabetic peripheral angiopathy without gangrene: Secondary | ICD-10-CM

## 2016-10-26 DIAGNOSIS — Z79899 Other long term (current) drug therapy: Secondary | ICD-10-CM

## 2016-10-26 DIAGNOSIS — I252 Old myocardial infarction: Secondary | ICD-10-CM

## 2016-10-26 DIAGNOSIS — M7730 Calcaneal spur, unspecified foot: Principal | ICD-10-CM

## 2016-10-26 DIAGNOSIS — C801 Malignant (primary) neoplasm, unspecified: Secondary | ICD-10-CM

## 2016-10-26 DIAGNOSIS — S90221D Contusion of right lesser toe(s) with damage to nail, subsequent encounter: Secondary | ICD-10-CM

## 2016-10-26 DIAGNOSIS — Z888 Allergy status to other drugs, medicaments and biological substances status: Secondary | ICD-10-CM

## 2016-10-26 DIAGNOSIS — M2041 Other hammer toe(s) (acquired), right foot: Secondary | ICD-10-CM

## 2016-10-26 DIAGNOSIS — Z951 Presence of aortocoronary bypass graft: Secondary | ICD-10-CM

## 2016-10-26 DIAGNOSIS — Z7983 Long term (current) use of bisphosphonates: Secondary | ICD-10-CM

## 2016-10-26 DIAGNOSIS — I2510 Atherosclerotic heart disease of native coronary artery without angina pectoris: Secondary | ICD-10-CM

## 2016-10-26 DIAGNOSIS — Z794 Long term (current) use of insulin: Secondary | ICD-10-CM

## 2016-10-26 DIAGNOSIS — E1122 Type 2 diabetes mellitus with diabetic chronic kidney disease: Secondary | ICD-10-CM

## 2016-10-26 DIAGNOSIS — E785 Hyperlipidemia, unspecified: Secondary | ICD-10-CM

## 2016-10-26 DIAGNOSIS — N189 Chronic kidney disease, unspecified: Secondary | ICD-10-CM

## 2016-10-26 DIAGNOSIS — S90221A Contusion of right lesser toe(s) with damage to nail, initial encounter: Secondary | ICD-10-CM

## 2016-10-26 DIAGNOSIS — I10 Essential (primary) hypertension: Secondary | ICD-10-CM

## 2016-10-26 DIAGNOSIS — I739 Peripheral vascular disease, unspecified: Secondary | ICD-10-CM

## 2016-10-26 DIAGNOSIS — K219 Gastro-esophageal reflux disease without esophagitis: Secondary | ICD-10-CM

## 2016-10-26 MED ORDER — CEPHALEXIN 500 MG PO CAPS: Start: 2016-10-26 — End: 2016-11-29

## 2016-10-26 NOTE — Progress Notes
Hour Take 1 tablet by mouth 2 times daily.   ? omeprazole (PriLOSEC) 40 MG Capsule Delayed Release Take 1 capsule by mouth daily.   ? sildenafil (VIAGRA) 100 MG PO Tablet Take 1 tablet by mouth as needed for erectile dysfunction.   ? tamsulosin (FLOMAX) 0.4 MG PO Capsule Take 1 capsule by mouth daily.     Last reviewed on 10/26/2016  8:43 AM by Charlett Blake    Active Problems:  Patient Active Problem List   Diagnosis   ? Diabetes mellitus   ? BPH (benign prostatic hyperplasia)   ? Esophageal dysmotility   ? History of noncompliance   ? Unspecified cataract   ? Chronic headaches   ? Vitamin B12 deficiency   ? GERD (gastroesophageal reflux disease)   ? Labile essential hypertension   ? Dyslipidemia   ? Erectile dysfunction   ? History of cutaneous melanoma   ? Status post cervical spinal fusion   ? Hammer toe   ? Plantar fibromatosis   ? Osteoarthritis of the knees   ? CAD (coronary artery disease)   ? History of 3V CABG in 2015   ? History of myocardial infarction in 2015   ? CKD (chronic kidney disease)   ? Cervical spinal stenosis   ? History of shingles   ? Status post insulin pump placement   ? DOE (dyspnea on exertion)   ? Right rotator cuff tendinopathy   ? Allergic rhinitis   ? Diastolic dysfunction       PMH:  Past Medical History:   Diagnosis Date   ? CAD (coronary artery disease)    ? Cancer    ? CKD (chronic kidney disease) 08/20/2014   ? Diabetes mellitus    ? Hypertension        PSH:  Past Surgical History:   Procedure Laterality Date   ? CARDIAC CATHERIZATION     ? CERVICAL FUSION     ? CORONARY ANGIOPLASTY     ? CORONARY ARTERY BYPASS GRAFT         Social Hx:  Social History     Social History   ? Marital status: Married     Spouse name: N/A   ? Number of children: N/A   ? Years of education: N/A     Occupational History   ? Not on file.     Social History Main Topics   ? Smoking status: Never Smoker   ? Smokeless tobacco: Never Used   ? Alcohol use No   ? Drug use: No   ? Sexual activity: Yes

## 2016-10-26 NOTE — Progress Notes
pressure areas as well as an entry of pathogens into the breached epithelium. Patient educated on the use of proper footwear to provide cushioning in high risk areas and areas of bony prominences. The use of white socks as well as inspection of tile when exiting the shower/bathtub is also suggested to prevent an open sore or cut to go un-noticed. The importance of a tight regulation of blood glucose was also thoroughly explained and advised to avoid future complications. Patient was advised to perform routine visits to PCP as well as other specialties realizing that diabetes is a multi-systemic disorder affecting the microvasculature of both the eyes and kidneys. Patient advised to call immediately if any complications or wounds were to arise.

## 2016-10-26 NOTE — Progress Notes
Partners: Female     Other Topics Concern   ? Not on file     Social History Narrative       Family Hx:  Family History   Problem Relation Age of Onset   ? Cancer Mother    ? Diabetes Father    ? Blindness Father    ? Cancer Sister    ? Cancer Brother        ROS:   Constitutional: denies fever, chills, or night sweats  HENT: denies headaches or changes in hearing  Eyes: denies visual disturbance  Respiratory: denies cough, shortness of breath  Cardiovascular: denies chest pain or palpitations  GI: denies nausea, vomiting, diarrhea, constipation, anorexia, and weight loss or gain  Neurological: denies numbness, weakness,   Endocrine: denies malaise, sleepiness, hot flashes  Hematologic/Lymphatic: denies fatigue, lumps or bumps anywhere, groin or armpit lumps, edema  GU: denies dysuria, hematuria, and increase or decrease in urination  Integumentary: denies rashes or other lesions  Psychiatric: denies changes in mood or affect    Physical Exam:  Height 1.803 m ( ), weight 86.2 kg (190 lb).    General: Oriented x 3 in NAD. Well nourished    Podiatric Specific    Vascular: DP pulse 2/4 and PT pulse 1/4 Left. Capillary refill time less than 3 seconds to all digits. Feet temperature WNL.     Neuro: Epicritic sense of light touch intact  bilat to tibial, deep peroneal, superficial peroneal, sural, and saphenous distributions.    Derm: Skin is warm, dry and supple. Right 2nd digit subungual hematoma <25%. No wounds noted. No erythema or other SOI noted.     Musculoskeletal: no history of amputation. Hammertoes 2-5 bilat. Muscle strength 5/5 Bilateral. Active EHL, FHL, TA, GSC bilat. Neg for pain on palpation or ROM. Pain on palption about R great toe.    Counseling/Education:  Patient was counseled on proper DM foot care including but not limited to a thorough inspection of the feet daily. The use of a moisturizer is suggested to avoid xerosis which may allow for the breakdown of high

## 2016-10-26 NOTE — Progress Notes
Department of Orthopedics   Established Patient Visit  Division Of Podiatric Medicine and Surgery     Assessment:     ICD-10-CM ICD-9-CM   1. Subungual hematoma of right foot, initial encounter S90.221A 924.20   2. PVD (peripheral vascular disease) I73.9 443.9   3. Encounter for diabetic foot exam E11.9 250.00     ADA Classification:   1 = Loss of protective sensation or PAD or deformity    Plan:  Pt seen and evaluated with all questions and concerns answered.   Xrays right foot taken today: no cortical irregularies noted.   Labs from [place] reviewed: No evidence of active infection noted. Clinically no signs of infection.   Segmental Dopplers were ordered to further investigate into circulatory compromise.   Advised patient if results are abnormal will inform and send referral to interventional cardiology team for outpatient followup.   Discussed etiology of peripheral vascular disease.   Discussed possible intervention that can be done both medical vs endovascular.   Discussed in depth with patient regarding fungal nail and hematoma. Subungual hematoma is <25% of nailbed. Will continue to monitor. Discussed using tea tree oils for fungal nails.   Discussed with patient at after dopplers are reviewied if perfusion is adequate a digital fasciotomy can be done for the right 2nd DIPJ to reduce to flexible deformity.   RTC 3 months, advised to call or return sooner if issues occur.     Orders Placed This Encounter   Procedures   ? XR Foot Right 3 Views   ? [place] Segmental Doppler     No orders of the following type(s) were placed in this encounter: Medications.       Referring Physician/Primary Care Physician:  Gearlean Alf, MD    Chief Complaint/Reason for Return:  Chief Complaint   Patient presents with   ? Toe Pain     Right Foot         HPI:  Patient seen today in office for right 2nd toe subungual hematoma. States last week noticed his toe was darkened and discolored. Patient states he

## 2016-10-26 NOTE — Progress Notes
went to [facility] where he was given IV zosyn and admitted for 3-4 days due to a possible infection. States left AMA due to "no one doing anything to help him". States has no pain with toe. Denies F/C/N/V. Presents with DM shoes.       Results for Gary Walters, Gary Walters (MRN [id number]) as of 12/04/2015 08:26   Ref. Range 11/11/2015 10:55   HGB A1C % Latest Ref Range: <5.7 % of total Hgb 8.2 (H)       Allergies:  Allergies   Allergen Reactions   ? Nsaids Other (See Comments)     ?Azotemia       Current Meds:  Outpatient Medications Prior to Visit   Medication Sig   ? amLODIPine (NORVASC) 10 MG PO Tablet Take 5 mg (1/2 tablet) by mouth 2 times daily.   ? atorvastatin (LIPITOR) 40 MG Tablet TAKE ONE TABLET BY MOUTH ONCE DAILY   ? benzonatate (TESSALON) 200 MG PO Capsule TAKE ONE CAPSULE BY MOUTH THREE TIMES DAILY AS NEEDED   ? cetirizine (ZyrTEC) 10 MG Tablet Take 5 mg (1/2 tablet) by mouth daily.   ? cloNIDine (CATAPRES) 0.1 MG PO Tablet Take 1 tablet by mouth 2 times daily as needed for severe hypertension.   ? clopidogrel (PLAVIX) 75 MG PO Tablet TAKE ONE TABLET BY MOUTH ONCE DAILY   ? diclofenac (VOLTAREN GEL) 1 % TD Gel Apply 2 g topically 4 times daily. **Measure dose using enclosed dose card**   ? gabapentin (NEURONTIN) 300 MG PO Capsule Take 1 capsule by mouth 3 times daily.   ? glucose blood (BAYER CONTOUR TEST) VI Strip 1 strip as instructed 5 times daily.   ? [START ON 11/05/2016] HYDROcodone-acetaminophen (NORCO) 5-325 MG PO Tablet Take 1 tablet by mouth every 6 hours as needed for pain. Earliest Fill Date: 11/05/16   ? insulin aspart (NovoLOG) vial 100 units daily via insulin pump   ? lidocaine-prilocaine (EMLA) 2.5-2.5 % EX Cream Apply to feet once a day   ? LORazepam (ATIVAN) 1 MG PO Tablet 1 pill hs pr n insomnia   ? losartan-hydroCHLOROthiazide (HYZAAR) 100-25 MG PO Tablet Take 50/12.5 mg (1/2 tablet) by mouth 2 times daily.   ? metoprolol succinate (TOPROL-XL) 25 MG PO Tablet Extended Release 24

## 2016-10-27 NOTE — Progress Notes
Teaching/Attestation Statement  I saw and evaluated the patient. I discussed with the resident and agree with resident?s findings and plan as documented in the resident?s note. I was present for the entire procedure.

## 2016-11-01 ENCOUNTER — Ambulatory Visit: Admit: 2016-11-01 | Discharge: 2016-11-02 | Attending: Podiatrist | Primary: Family Medicine

## 2016-11-01 ENCOUNTER — Encounter

## 2016-11-01 ENCOUNTER — Inpatient Hospital Stay: Admit: 2016-11-01 | Discharge: 2016-11-02 | Primary: Family Medicine

## 2016-11-01 DIAGNOSIS — N189 Chronic kidney disease, unspecified: Secondary | ICD-10-CM

## 2016-11-01 DIAGNOSIS — I2510 Atherosclerotic heart disease of native coronary artery without angina pectoris: Secondary | ICD-10-CM

## 2016-11-01 DIAGNOSIS — Z981 Arthrodesis status: Secondary | ICD-10-CM

## 2016-11-01 DIAGNOSIS — Z7902 Long term (current) use of antithrombotics/antiplatelets: Secondary | ICD-10-CM

## 2016-11-01 DIAGNOSIS — I739 Peripheral vascular disease, unspecified: Secondary | ICD-10-CM

## 2016-11-01 DIAGNOSIS — Z79899 Other long term (current) drug therapy: Secondary | ICD-10-CM

## 2016-11-01 DIAGNOSIS — Z7983 Long term (current) use of bisphosphonates: Secondary | ICD-10-CM

## 2016-11-01 DIAGNOSIS — Z859 Personal history of malignant neoplasm, unspecified: Secondary | ICD-10-CM

## 2016-11-01 DIAGNOSIS — K219 Gastro-esophageal reflux disease without esophagitis: Secondary | ICD-10-CM

## 2016-11-01 DIAGNOSIS — Z794 Long term (current) use of insulin: Secondary | ICD-10-CM

## 2016-11-01 DIAGNOSIS — I1310 Hypertensive heart and chronic kidney disease without heart failure, with stage 1 through stage 4 chronic kidney disease, or unspecified chronic kidney disease: Secondary | ICD-10-CM

## 2016-11-01 DIAGNOSIS — E1122 Type 2 diabetes mellitus with diabetic chronic kidney disease: Secondary | ICD-10-CM

## 2016-11-01 DIAGNOSIS — M7989 Other specified soft tissue disorders: Secondary | ICD-10-CM

## 2016-11-01 DIAGNOSIS — C801 Malignant (primary) neoplasm, unspecified: Secondary | ICD-10-CM

## 2016-11-01 DIAGNOSIS — Z951 Presence of aortocoronary bypass graft: Secondary | ICD-10-CM

## 2016-11-01 DIAGNOSIS — L97502 Non-pressure chronic ulcer of other part of unspecified foot with fat layer exposed: Secondary | ICD-10-CM

## 2016-11-01 DIAGNOSIS — Z9861 Coronary angioplasty status: Secondary | ICD-10-CM

## 2016-11-01 DIAGNOSIS — M2041 Other hammer toe(s) (acquired), right foot: Secondary | ICD-10-CM

## 2016-11-01 DIAGNOSIS — E1151 Type 2 diabetes mellitus with diabetic peripheral angiopathy without gangrene: Secondary | ICD-10-CM

## 2016-11-01 DIAGNOSIS — Z886 Allergy status to analgesic agent status: Secondary | ICD-10-CM

## 2016-11-01 DIAGNOSIS — I70201 Unspecified atherosclerosis of native arteries of extremities, right leg: Secondary | ICD-10-CM

## 2016-11-01 DIAGNOSIS — I10 Essential (primary) hypertension: Secondary | ICD-10-CM

## 2016-11-01 DIAGNOSIS — M79671 Pain in right foot: Secondary | ICD-10-CM

## 2016-11-01 DIAGNOSIS — I252 Old myocardial infarction: Secondary | ICD-10-CM

## 2016-11-01 DIAGNOSIS — M4802 Spinal stenosis, cervical region: Secondary | ICD-10-CM

## 2016-11-01 DIAGNOSIS — Z85820 Personal history of malignant melanoma of skin: Secondary | ICD-10-CM

## 2016-11-01 DIAGNOSIS — M2042 Other hammer toe(s) (acquired), left foot: Secondary | ICD-10-CM

## 2016-11-01 DIAGNOSIS — Z833 Family history of diabetes mellitus: Secondary | ICD-10-CM

## 2016-11-01 DIAGNOSIS — R609 Edema, unspecified: Secondary | ICD-10-CM

## 2016-11-01 DIAGNOSIS — L03031 Cellulitis of right toe: Secondary | ICD-10-CM

## 2016-11-01 DIAGNOSIS — M792 Neuralgia and neuritis, unspecified: Secondary | ICD-10-CM

## 2016-11-01 DIAGNOSIS — E11621 Type 2 diabetes mellitus with foot ulcer: Principal | ICD-10-CM

## 2016-11-01 DIAGNOSIS — E119 Type 2 diabetes mellitus without complications: Principal | ICD-10-CM

## 2016-11-01 MED ORDER — LIDOCAINE HCL URETHRAL/MUCOSAL 2 % EX GEL
3 refills | Status: CP
Start: 2016-11-01 — End: 2017-12-28

## 2016-11-01 MED ORDER — SULFAMETHOXAZOLE-TRIMETHOPRIM 800-160 MG PO TABS
1 | ORAL_TABLET | Freq: Two times a day (BID) | ORAL | 0 refills | Status: CP
Start: 2016-11-01 — End: 2016-11-08

## 2016-11-01 NOTE — Progress Notes
Derm: Hyperkeratotic lesion at the distal plantar tuft of the R 2nd digit. Erythema noted at R 2nd digit, extends to R forefoot without streaking. R 2nd digit taut on compression. No drainage, no purulence.     Musculoskeletal: No history of amputation. Hammertoes 2-5 bilat. Muscle strength 5/5 Bilateral. Active EHL, FHL, TA, GSC bilat. Pain at R midfoot. No pain at R 2nd digit 2/2 neuropathy. All compartments are soft and compressible.

## 2016-11-01 NOTE — Progress Notes
Betadine prep performed at R forefoot. R 2nd digit nail avulsion performed without issue, no underlying wound. Distal hyperkeratotic lesion sharply debrided utilizing a #15 blade. Scant seropurulent fluid released and swabbed, sent to micro for further analysis.   - Dressed R foot with betadine, DSD. Instructed patient to leave dressing intact for 48h then to dress R foot 2nd digit once a day with betadine WTD.  - Surgical shoe dispensed; WBAT at RLE.   - Lidocaine gel prescribed on today's visit. The purpose and proper application discussed at length.   - Bactrim e-prescribed on today's visit; 1w PO course. Failed keflex previously.   - Signs and symptoms of an active infection were reviewed with the patient. Should any of these progress the patient was instructed to seek immediate medical treatment.  - Discussed the importance of maintaining a blood glucose within a normal range as well as the detrimental impacts a persistently elevated level has on ones health and wound healing.  - Patient to f/u in 1 week, but was instructed to contact the clinic sooner should any problems arise.            Orders Placed This Encounter   Procedures   ? AEROBIC AND ANAEROBIC CULTURE   ? XR Foot Right 3 Views   ? ABI - Cardiology Performed     Orders Placed This Encounter   Medications   ? lidocaine (XYLOCAINE) 2 % EX Gel     Sig: Apply topically as needed     Dispense:  30 mL     Refill:  3     Patient has failed OTC topicals and alternative topical pain medication. At this time, topical lidocaine gel is indicated.   ? sulfamethoxazole-trimethoprim (BACTRIM DS,SEPTRA DS) 800-160 MG PO Tablet     Sig: Take 1 tablet by mouth 2 times daily for 7 days.     Dispense:  14 tablet     Refill:  0       Referring Physician/Primary Care Physician:  Gearlean Alf, MD    Chief Complaint/Reason for Return:  Chief Complaint   Patient presents with   ? Foot Pain     Right Foot         HPI:

## 2016-11-01 NOTE — Progress Notes
?   insulin aspart (NovoLOG) vial 100 units daily via insulin pump   ? lidocaine-prilocaine (EMLA) 2.5-2.5 % EX Cream Apply to feet once a day   ? LORazepam (ATIVAN) 1 MG PO Tablet 1 pill hs pr n insomnia   ? losartan-hydroCHLOROthiazide (HYZAAR) 100-25 MG PO Tablet Take 50/12.5 mg (1/2 tablet) by mouth 2 times daily.   ? metoprolol succinate (TOPROL-XL) 25 MG PO Tablet Extended Release 24 Hour Take 1 tablet by mouth 2 times daily.   ? omeprazole (PriLOSEC) 40 MG Capsule Delayed Release Take 1 capsule by mouth daily.   ? sildenafil (VIAGRA) 100 MG PO Tablet Take 1 tablet by mouth as needed for erectile dysfunction.   ? tamsulosin (FLOMAX) 0.4 MG PO Capsule Take 1 capsule by mouth daily.     Last reviewed on 11/01/2016  8:50 AM by Layla Maw, DPM    Active Problems:  Patient Active Problem List   Diagnosis   ? Diabetes mellitus   ? BPH (benign prostatic hyperplasia)   ? Esophageal dysmotility   ? History of noncompliance   ? Unspecified cataract   ? Chronic headaches   ? Vitamin B12 deficiency   ? GERD (gastroesophageal reflux disease)   ? Labile essential hypertension   ? Dyslipidemia   ? Erectile dysfunction   ? History of cutaneous melanoma   ? Status post cervical spinal fusion   ? Hammer toe   ? Plantar fibromatosis   ? Osteoarthritis of the knees   ? CAD (coronary artery disease)   ? History of 3V CABG in 2015   ? History of myocardial infarction in 2015   ? CKD (chronic kidney disease)   ? Cervical spinal stenosis   ? History of shingles   ? Status post insulin pump placement   ? DOE (dyspnea on exertion)   ? Right rotator cuff tendinopathy   ? Allergic rhinitis   ? Diastolic dysfunction       PMH:  Past Medical History:   Diagnosis Date   ? CAD (coronary artery disease)    ? Cancer    ? CKD (chronic kidney disease) 08/20/2014   ? Diabetes mellitus    ? Hypertension        PSH:  Past Surgical History:   Procedure Laterality Date   ? CARDIAC CATHERIZATION     ? CERVICAL FUSION

## 2016-11-01 NOTE — Progress Notes
?   CORONARY ANGIOPLASTY     ? CORONARY ARTERY BYPASS GRAFT         Social Hx:  Social History     Social History   ? Marital status: Married     Spouse name: N/A   ? Number of children: N/A   ? Years of education: N/A     Occupational History   ? Not on file.     Social History Main Topics   ? Smoking status: Never Smoker   ? Smokeless tobacco: Never Used   ? Alcohol use No   ? Drug use: No   ? Sexual activity: Yes     Partners: Female     Other Topics Concern   ? Not on file     Social History Narrative       Family Hx:  Family History   Problem Relation Age of Onset   ? Cancer Mother    ? Diabetes Father    ? Blindness Father    ? Cancer Sister    ? Cancer Brother        ROS:   Constitutional: denies fever, chills, or night sweats  HENT: denies headaches or changes in hearing  Eyes: denies visual disturbance  Respiratory: denies cough, shortness of breath  Cardiovascular: denies chest pain or palpitations  GI: denies nausea, vomiting, diarrhea, constipation, anorexia, and weight loss or gain  Neurological: denies numbness, weakness  Endocrine: denies malaise, sleepiness, hot flashes  Hematologic/Lymphatic: denies fatigue, lumps or bumps anywhere, groin or armpit lumps, edema  GU: denies dysuria, hematuria, and increase or decrease in urination  Integumentary: reports redness at R forefoot; most notable at 2nd digit  Psychiatric: denies changes in mood or affect      Physical Exam:  Height 1.778 m ( ), weight 85.3 kg (188 lb).    General: Oriented x 3 in NAD. Well nourished    Podiatric Specific    Vascular: DP pulse 1/4 and PT pulse barely palpable at RLE. Capillary refill time less than 35 seconds to all digits. R forefoot warm on touch. Pedal hair intact. Peripheral edema at R forefoot; most notable at 2nd digit.     Neuro: Epicritic sense of light touch diminished bilat to tibial, deep peroneal, superficial peroneal, sural, and saphenous distributions.

## 2016-11-01 NOTE — Progress Notes
Department of Orthopedics   Established Patient Visit  Division Of Podiatric Medicine and Surgery     Assessment:     ICD-10-CM ICD-9-CM   1. Cellulitis of toe of right foot L03.031 681.10   2. Diabetes mellitus with peripheral vascular disease E11.51 250.70     443.81   3. Peripheral edema R60.9 782.3   4. PVD (peripheral vascular disease) I73.9 443.9   5. Intermittent claudication I73.9 443.9   6. 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 Right foot pain M79.671 729.5   8. Nerve pain M79.2 729.2     ADA Classification:   3 = History of plantar ulceration, neuropathic fracture (Charcot foot) or amputation      Plan:  - Patient examined and evaluated with all questions and concerns addressed.  - R 2nd digit with erythema, warmth, taut on compression; no streaking, drainage from distal tuft. Suspicion for underlying abscess at distal R 2nd digit.  - XR R foot: Mild soft tissue swelling along the dorsum of the midfoot but no fractures or erosions. Negative for soft tissue gas.  - The nature of the problem and alternative treatments were discussed in detail with the patient. The anticipated benefits and expected outcomes of the proposed treatments were discussed, as well as the risks and benefits associated with the treatment options. In addition, the consequences of no treatment were discussed in detail. The patient was given a chance to ask any questions they may have had about the proposed treatment plan, and these questions were answered by the treating physician.  - Discussed performing R 2nd digit nail avulsion with I&D of R distal 2nd digit. Risks, benefits and potential complications reviewed at length. Patient expressed understanding and wished to continue with the aforementioned procedure. Due to neuropathy no local anesthesia required.
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
## 2016-11-02 NOTE — Addendum Note
Addended by: Ramond Dial on: 11/01/2016 08:28 PM      Modules accepted: Orders

## 2016-11-03 ENCOUNTER — Ambulatory Visit: Attending: "Endocrinology | Primary: Family Medicine

## 2016-11-03 DIAGNOSIS — Z794 Long term (current) use of insulin: Secondary | ICD-10-CM

## 2016-11-03 DIAGNOSIS — E119 Type 2 diabetes mellitus without complications: Principal | ICD-10-CM

## 2016-11-03 DIAGNOSIS — C801 Malignant (primary) neoplasm, unspecified: Secondary | ICD-10-CM

## 2016-11-03 DIAGNOSIS — I10 Essential (primary) hypertension: Secondary | ICD-10-CM

## 2016-11-03 DIAGNOSIS — E1142 Type 2 diabetes mellitus with diabetic polyneuropathy: Principal | ICD-10-CM

## 2016-11-03 DIAGNOSIS — E7849 Other hyperlipidemia: Secondary | ICD-10-CM

## 2016-11-03 DIAGNOSIS — I2510 Atherosclerotic heart disease of native coronary artery without angina pectoris: Secondary | ICD-10-CM

## 2016-11-03 DIAGNOSIS — I25810 Atherosclerosis of coronary artery bypass graft(s) without angina pectoris: Secondary | ICD-10-CM

## 2016-11-03 DIAGNOSIS — N189 Chronic kidney disease, unspecified: Secondary | ICD-10-CM

## 2016-11-03 MED ORDER — METFORMIN HCL 1000 MG PO TABS
1000 mg | Freq: Two times a day (BID) | ORAL | 6 refills | Status: CP
Start: 2016-11-03 — End: 2018-06-27

## 2016-11-05 ENCOUNTER — Inpatient Hospital Stay: Admit: 2016-11-05 | Discharge: 2016-11-06 | Primary: Family Medicine

## 2016-11-05 DIAGNOSIS — I739 Peripheral vascular disease, unspecified: Principal | ICD-10-CM

## 2016-11-08 ENCOUNTER — Ambulatory Visit: Admit: 2016-11-08 | Discharge: 2016-11-08 | Attending: Podiatrist | Primary: Family Medicine

## 2016-11-08 DIAGNOSIS — C801 Malignant (primary) neoplasm, unspecified: Secondary | ICD-10-CM

## 2016-11-08 DIAGNOSIS — I739 Peripheral vascular disease, unspecified: Secondary | ICD-10-CM

## 2016-11-08 DIAGNOSIS — M2042 Other hammer toe(s) (acquired), left foot: Secondary | ICD-10-CM

## 2016-11-08 DIAGNOSIS — L97502 Non-pressure chronic ulcer of other part of unspecified foot with fat layer exposed: Secondary | ICD-10-CM

## 2016-11-08 DIAGNOSIS — R609 Edema, unspecified: Secondary | ICD-10-CM

## 2016-11-08 DIAGNOSIS — Z886 Allergy status to analgesic agent status: Secondary | ICD-10-CM

## 2016-11-08 DIAGNOSIS — Z79899 Other long term (current) drug therapy: Secondary | ICD-10-CM

## 2016-11-08 DIAGNOSIS — K219 Gastro-esophageal reflux disease without esophagitis: Secondary | ICD-10-CM

## 2016-11-08 DIAGNOSIS — Z981 Arthrodesis status: Secondary | ICD-10-CM

## 2016-11-08 DIAGNOSIS — I10 Essential (primary) hypertension: Secondary | ICD-10-CM

## 2016-11-08 DIAGNOSIS — E785 Hyperlipidemia, unspecified: Secondary | ICD-10-CM

## 2016-11-08 DIAGNOSIS — Z01810 Encounter for preprocedural cardiovascular examination: Secondary | ICD-10-CM

## 2016-11-08 DIAGNOSIS — Z794 Long term (current) use of insulin: Secondary | ICD-10-CM

## 2016-11-08 DIAGNOSIS — E11621 Type 2 diabetes mellitus with foot ulcer: Principal | ICD-10-CM

## 2016-11-08 DIAGNOSIS — E1169 Type 2 diabetes mellitus with other specified complication: Secondary | ICD-10-CM

## 2016-11-08 DIAGNOSIS — I2510 Atherosclerotic heart disease of native coronary artery without angina pectoris: Secondary | ICD-10-CM

## 2016-11-08 DIAGNOSIS — Z951 Presence of aortocoronary bypass graft: Secondary | ICD-10-CM

## 2016-11-08 DIAGNOSIS — N189 Chronic kidney disease, unspecified: Secondary | ICD-10-CM

## 2016-11-08 DIAGNOSIS — L03031 Cellulitis of right toe: Secondary | ICD-10-CM

## 2016-11-08 DIAGNOSIS — M79671 Pain in right foot: Secondary | ICD-10-CM

## 2016-11-08 DIAGNOSIS — I1310 Hypertensive heart and chronic kidney disease without heart failure, with stage 1 through stage 4 chronic kidney disease, or unspecified chronic kidney disease: Secondary | ICD-10-CM

## 2016-11-08 DIAGNOSIS — Z7902 Long term (current) use of antithrombotics/antiplatelets: Secondary | ICD-10-CM

## 2016-11-08 DIAGNOSIS — M2041 Other hammer toe(s) (acquired), right foot: Secondary | ICD-10-CM

## 2016-11-08 DIAGNOSIS — M7730 Calcaneal spur, unspecified foot: Secondary | ICD-10-CM

## 2016-11-08 DIAGNOSIS — E1151 Type 2 diabetes mellitus with diabetic peripheral angiopathy without gangrene: Secondary | ICD-10-CM

## 2016-11-08 DIAGNOSIS — Z9861 Coronary angioplasty status: Secondary | ICD-10-CM

## 2016-11-08 DIAGNOSIS — E1122 Type 2 diabetes mellitus with diabetic chronic kidney disease: Secondary | ICD-10-CM

## 2016-11-08 DIAGNOSIS — Z833 Family history of diabetes mellitus: Secondary | ICD-10-CM

## 2016-11-08 DIAGNOSIS — M79674 Pain in right toe(s): Secondary | ICD-10-CM

## 2016-11-08 DIAGNOSIS — M869 Osteomyelitis, unspecified: Secondary | ICD-10-CM

## 2016-11-08 DIAGNOSIS — I998 Other disorder of circulatory system: Principal | ICD-10-CM

## 2016-11-08 DIAGNOSIS — I96 Gangrene, not elsewhere classified: Secondary | ICD-10-CM

## 2016-11-08 DIAGNOSIS — I252 Old myocardial infarction: Secondary | ICD-10-CM

## 2016-11-08 DIAGNOSIS — Z7983 Long term (current) use of bisphosphonates: Secondary | ICD-10-CM

## 2016-11-08 DIAGNOSIS — E119 Type 2 diabetes mellitus without complications: Principal | ICD-10-CM

## 2016-11-08 DIAGNOSIS — L97509 Non-pressure chronic ulcer of other part of unspecified foot with unspecified severity: Secondary | ICD-10-CM

## 2016-11-08 MED ORDER — ACETAMINOPHEN 500 MG PO TABS
1000 mg | Freq: Once | ORAL | Status: CN
Start: 2016-11-08 — End: ?

## 2016-11-08 MED ORDER — CELECOXIB 200 MG PO CAPS
400 mg | Freq: Once | ORAL | Status: CN
Start: 2016-11-08 — End: ?

## 2016-11-08 MED ORDER — SULFAMETHOXAZOLE-TRIMETHOPRIM 800-160 MG PO TABS
1 | ORAL_TABLET | Freq: Two times a day (BID) | ORAL | 0 refills | Status: CP
Start: 2016-11-08 — End: 2016-11-12

## 2016-11-08 MED ORDER — SODIUM CHLORIDE 0.9 % IV SOLN
1000 mL | INTRAVENOUS | Status: CN
Start: 2016-11-08 — End: ?

## 2016-11-08 MED ORDER — CEFAZOLIN SODIUM 1 G IJ SOLR
2 g | Freq: Once | INTRAVENOUS | Status: CN
Start: 2016-11-08 — End: ?

## 2016-11-08 MED ORDER — GABAPENTIN 300 MG PO CAPS
600 mg | Freq: Once | ORAL | Status: CN
Start: 2016-11-08 — End: ?

## 2016-11-08 NOTE — H&P (View-Only)
?   History of noncompliance   ? Unspecified cataract   ? Chronic headaches   ? Vitamin B12 deficiency   ? GERD (gastroesophageal reflux disease)   ? Labile essential hypertension   ? Dyslipidemia   ? Erectile dysfunction   ? History of cutaneous melanoma   ? Status post cervical spinal fusion   ? Hammer toe   ? Plantar fibromatosis   ? Osteoarthritis of the knees   ? CAD (coronary artery disease)   ? History of 3V CABG in 2015   ? History of myocardial infarction in 2015   ? CKD (chronic kidney disease)   ? Cervical spinal stenosis   ? History of shingles   ? Status post insulin pump placement   ? DOE (dyspnea on exertion)   ? Right rotator cuff tendinopathy   ? Allergic rhinitis   ? Diastolic dysfunction       PMH:  Past Medical History:   Diagnosis Date   ? CAD (coronary artery disease)    ? Cancer    ? CKD (chronic kidney disease) 08/20/2014   ? Diabetes mellitus    ? Hypertension        PSH:  Past Surgical History:   Procedure Laterality Date   ? CARDIAC CATHERIZATION     ? CERVICAL FUSION     ? CORONARY ANGIOPLASTY     ? CORONARY ARTERY BYPASS GRAFT         Social Hx:  Social History     Social History   ? Marital status: Married     Spouse name: N/A   ? Number of children: N/A   ? Years of education: N/A     Occupational History   ? Not on file.     Social History Main Topics   ? Smoking status: Never Smoker   ? Smokeless tobacco: Never Used   ? Alcohol use No   ? Drug use: No   ? Sexual activity: Yes     Partners: Female     Other Topics Concern   ? Not on file     Social History Narrative       Family Hx:  Family History   Problem Relation Age of Onset   ? Cancer Mother    ? Diabetes Father    ? Blindness Father    ? Cancer Sister    ? Cancer Brother        ROS:   Constitutional: denies fever, chills, or night sweats  HENT: denies headaches or changes in hearing  Eyes: denies visual disturbance  Respiratory: denies cough, shortness of breath  Cardiovascular: denies chest pain or palpitations

## 2016-11-08 NOTE — H&P (View-Only)
?   cetirizine (ZyrTEC) 10 MG Tablet Take 5 mg (1/2 tablet) by mouth daily.   ? cloNIDine (CATAPRES) 0.1 MG PO Tablet Take 1 tablet by mouth 2 times daily as needed for severe hypertension.   ? clopidogrel (PLAVIX) 75 MG PO Tablet TAKE ONE TABLET BY MOUTH ONCE DAILY   ? diclofenac (VOLTAREN GEL) 1 % TD Gel Apply 2 g topically 4 times daily. **Measure dose using enclosed dose card**   ? gabapentin (NEURONTIN) 300 MG PO Capsule Take 1 capsule by mouth 3 times daily.   ? glucose blood (BAYER CONTOUR TEST) VI Strip 1 strip as instructed 5 times daily.   ? HYDROcodone-acetaminophen (NORCO) 5-325 MG PO Tablet Take 1 tablet by mouth every 6 hours as needed for pain. Earliest Fill Date: 11/05/16   ? insulin aspart (NovoLOG) vial 100 units daily via insulin pump   ? lidocaine (XYLOCAINE) 2 % EX Gel Apply topically as needed   ? lidocaine-prilocaine (EMLA) 2.5-2.5 % EX Cream Apply to feet once a day   ? LORazepam (ATIVAN) 1 MG PO Tablet 1 pill hs pr n insomnia   ? losartan-hydroCHLOROthiazide (HYZAAR) 100-25 MG PO Tablet Take 50/12.5 mg (1/2 tablet) by mouth 2 times daily.   ? metFORMIN (GLUCOPHAGE) 1000 MG PO Tablet Take 1 tablet by mouth 2 times daily (with meals). SrCr: 1.60* mg/dl (16/10 9604) [last value]   ? metoprolol succinate (TOPROL-XL) 25 MG PO Tablet Extended Release 24 Hour Take 1 tablet by mouth 2 times daily.   ? omeprazole (PriLOSEC) 40 MG Capsule Delayed Release Take 1 capsule by mouth daily.   ? sildenafil (VIAGRA) 100 MG PO Tablet Take 1 tablet by mouth as needed for erectile dysfunction.   ? sulfamethoxazole-trimethoprim (BACTRIM DS,SEPTRA DS) 800-160 MG PO Tablet Take 1 tablet by mouth 2 times daily for 7 days.   ? tamsulosin (FLOMAX) 0.4 MG PO Capsule Take 1 capsule by mouth daily.     Last reviewed on 11/08/2016 12:10 PM by Claude Manges, MA    Active Problems:  Patient Active Problem List   Diagnosis   ? Diabetes mellitus   ? BPH (benign prostatic hyperplasia)   ? Esophageal dysmotility

## 2016-11-08 NOTE — H&P (View-Only)
Department of Orthopedics   Established Patient Visit  Division Of Podiatric Medicine and Surgery     Assessment:     ICD-10-CM ICD-9-CM   1. 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 Diabetes mellitus with peripheral vascular disease E11.51 250.70     443.81   3. Peripheral edema R60.9 782.3   4. 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 Cellulitis of toe of right foot L03.031 681.10   6. Right foot pain M79.671 729.5   7. Pre-operative cardiovascular examination Z01.810 V72.81     ADA Classification:   3 = History of plantar ulceration, neuropathic fracture (Charcot foot) or amputation      Plan:  - Patient examined and evaluated with all questions and concerns addressed.  - XR R foot: Mild soft tissue swelling along the dorsum of the midfoot but no fractures or erosions. Negative for soft tissue gas.  - 11/01/16 Culture results reviewed with pt: enterobacter cloacae   Susceptible to bactrim  - Rx for Bactrim sent to his pharmacy   - Segmental Dopplers reviewed: abnormal waveforms  - Sharp debridement with #15 blade of distal tip of R 2nd toe performed including all non viable tissue down to the level of subcutaneous tissue.  - Wound did probe to distal phalanx.  - Discussion held with patient about the possibility of infection traveling proximally which may lead to amputation of his entire 2nd toe vs partial 2nd ray vs TMA. Decision was made to proceed with amputation of right 2nd toe at the DIPJ vs PIPJ next Tuesday (11/16/16)   - Discussed patient with Dr. Myrtie Hawk who will plan for angio next Tuesday after the amputation.   - Dressed R foot with betadine, DSD. Instructed patient to leave dressing intact for 24h then to dress R foot 2nd digit once a day with betadine WTD.  - Signs and symptoms of an active infection were reviewed with the

## 2016-11-08 NOTE — H&P (View-Only)
GI: denies nausea, vomiting, diarrhea, constipation, anorexia, and weight loss or gain  Neurological: denies numbness, weakness  Endocrine: denies malaise, sleepiness, hot flashes  Hematologic/Lymphatic: denies fatigue, lumps or bumps anywhere, groin or armpit lumps, edema  GU: denies dysuria, hematuria, and increase or decrease in urination  Integumentary: reports redness at R forefoot; most notable at 2nd digit  Psychiatric: denies changes in mood or affect      Physical Exam:  Height 1.778 m ( ), weight 85.7 kg (189 lb).    General: Oriented x 3 in NAD. Well nourished    Podiatric Specific    Vascular: DP pulse barely palpable and PT pulse barely palpable at RLE. Capillary refill time less than 5 seconds to all digits. R forefoot warm on touch. Pedal hair intact. Peripheral edema at R forefoot; most notable at 2nd digit.     Neuro: Epicritic sense of light touch diminished bilat to tibial, deep peroneal, superficial peroneal, sural, and saphenous distributions.    Derm: Ulceration to distal tip of R 2nd toe, upon debridement of non viable tissue wound did probe to distal phalanx. Erythema noted at R 2nd digit, extends to R forefoot without streaking. No expressible purulence or mal odor.     Musculoskeletal: No history of amputation. Hammertoes 2-5 bilat. Muscle strength 5/5 Bilateral. Active EHL, FHL, TA, GSC bilat. Pain at R midfoot. No pain at R 2nd digit 2/2 neuropathy. All compartments are soft and compressible.     Conclusion: Cx from R 2nd toe  11/03/2016 12:26 PM - Alphonsus Sias I   ?   Culture    ? Enterobacter cloacae   ?    ?   Stain    ? Few neutrophils   ?    ? No squamous epithelial cells   ?    ? Gram negative rods   ?    ?   Susceptibility    ?  Enterobacter cloacae    ? Cefazolin R    ? Ceftriaxone Susc    ? Gentamicin Susc    ? Levofloxacin Susc    ? Piperacillin/Tazobactam Susc    ? Tobramycin Susc    ? Trimethoprim/Sulfamethoxazole Susc        Per Radiology: Xr Foot Right 3 Views

## 2016-11-08 NOTE — H&P (View-Only)
patient. Should any of these progress the patient was instructed to seek immediate medical treatment.  - Discussed the importance of maintaining a blood glucose within a normal range as well as the detrimental impacts a persistently elevated level has on ones health and wound healing.    RTC surgery scheduler to contact           Orders Placed This Encounter   Procedures   ? ECG - Adult     Orders Placed This Encounter   Medications   ? sulfamethoxazole-trimethoprim (BACTRIM DS,SEPTRA DS) 800-160 MG PO Tablet     Sig: Take 1 tablet by mouth 2 times daily for 7 days.     Dispense:  14 tablet     Refill:  0       Referring Physician/Primary Care Physician:  Gearlean Alf, MD    Chief Complaint/Reason for Return:  Chief Complaint   Patient presents with   ? Follow-up     right foot          HPI:  71 y/o M presents for continued care of R forefoot redness and R 2nd digit pain. Patient states that since last visit he has performed daily betadine dressing changes. He states he believes there has been mild improvement since last visit although he does report the tip of his toe is beginning to turn black. Has been taking the Bactrim as prescribed. Has completed Seg Dop test and would like to review results. Denies current N/V/F/C or SOB. Previous HgA1c was 8.2% on 11-11-15.      Results for HESTER, FORGET (MRN [id number]) as of 12/04/2015 08:26   Ref. Range 11/11/2015 10:55   HGB A1C % Latest Ref Range: <5.7 % of total Hgb 8.2 (H)       Allergies:  Allergies   Allergen Reactions   ? Nsaids Other (See Comments)     ?Azotemia       Current Meds:  Outpatient Medications Prior to Visit   Medication Sig   ? amLODIPine (NORVASC) 10 MG PO Tablet Take 5 mg (1/2 tablet) by mouth 2 times daily.   ? atorvastatin (LIPITOR) 40 MG Tablet TAKE ONE TABLET BY MOUTH ONCE DAILY   ? benzonatate (TESSALON) 200 MG PO Capsule TAKE ONE CAPSULE BY MOUTH THREE TIMES DAILY AS NEEDED   ? cephALEXin (KEFLEX) 500 MG PO Capsule

## 2016-11-08 NOTE — H&P (View-Only)
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 Normal alignment. Atherosclerotic vascular disease noted. Negative for radiopaque foreign bodies or soft tissue gas. Read By Alysia Penna D.O.  Electronically Verified By Alysia Penna D.O.  Released Date Time - 11/01/2016 10:53 AM  Resident -     Per Radiology: Karie Georges Foot Right 3 Views    Result Date: 10/26/2016  XR FOOT RIGHT 3 VIEWS Clinical indication:70 years Male Subungual hematoma of right foot, initial encounter Comparison:  None Findings: Negative for fracture or dislocation. Plantar calcaneal spur. The joint spaces are normal.  Vascular calcifications are present.      Impression: No acute bony abnormalities. I personally reviewed the images and the residents findings and agree with the above. Read By Florencia Reasons M.D.  Electronically Verified By - Florencia Reasons M.D.  Released Date Time - 10/26/2016 1:00 PM  Resident - Floyce Stakes    Debridement, Skin Sub-Q Tissue =< 20 sq cm  Date/Time: 11/08/2016 4:46 PM  Performed by: Eual Fines  Authorized by: Eual Fines   Preparation: Patient was prepped and draped in the usual sterile fashion.  Local anesthesia used: no    Anesthesia:  Local anesthesia used: no    Sedation:  Patient sedated: no  Patient tolerance: Patient tolerated the procedure well with no immediate complications  Comments: Sharp debridement with #15 blade of distal tip of R 2nd toe performed including all non viable tissue down to the level of subcutaneous tissue.

## 2016-11-08 NOTE — H&P (View-Only)
patient. Should any of these progress the patient was instructed to seek immediate medical treatment.  - Discussed the importance of maintaining a blood glucose within a normal range as well as the detrimental impacts a persistently elevated level has on ones health and wound healing.    RTC surgery scheduler to contact           Orders Placed This Encounter   Procedures   ? ECG - Adult     Orders Placed This Encounter   Medications   ? sulfamethoxazole-trimethoprim (BACTRIM DS,SEPTRA DS) 800-160 MG PO Tablet     Sig: Take 1 tablet by mouth 2 times daily for 7 days.     Dispense:  14 tablet     Refill:  0       Referring Physician/Primary Care Physician:  Gearlean Alf, MD    Chief Complaint/Reason for Return:  Chief Complaint   Patient presents with   ? Follow-up     right foot          HPI:  71 y/o M presents for continued care of R forefoot redness and R 2nd digit pain. Patient states that since last visit he has performed daily betadine dressing changes. He states he believes there has been mild improvement since last visit although he does report the tip of his toe is beginning to turn black. Has been taking the Bactrim as prescribed. Has completed Seg Dop test and would like to review results. Denies current N/V/F/C or SOB. Previous HgA1c was 8.2% on 11-11-15.      Results for GRANVILLE, WHITEFIELD (MRN [id number]) as of 12/04/2015 08:26   Ref. Range 11/11/2015 10:55   HGB A1C % Latest Ref Range: <5.7 % of total Hgb 8.2 (H)       Allergies:  Allergies   Allergen Reactions   ? Nsaids Other (See Comments)     ?Azotemia       Current Meds:  Outpatient Medications Prior to Visit   Medication Sig   ? amLODIPine (NORVASC) 10 MG PO Tablet Take 5 mg (1/2 tablet) by mouth 2 times daily.   ? atorvastatin (LIPITOR) 40 MG Tablet TAKE ONE TABLET BY MOUTH ONCE DAILY   ? benzonatate (TESSALON) 200 MG PO Capsule TAKE ONE CAPSULE BY MOUTH THREE TIMES DAILY AS NEEDED   ? cephALEXin (KEFLEX) 500 MG PO Capsule

## 2016-11-09 DIAGNOSIS — I739 Peripheral vascular disease, unspecified: Principal | ICD-10-CM

## 2016-11-09 MED ORDER — SODIUM CHLORIDE FLUSH 0.9 % IV SOLN
5 mL | Freq: Two times a day (BID) | INTRAVENOUS | Status: CN
Start: 2016-11-09 — End: ?

## 2016-11-09 MED ORDER — SODIUM CHLORIDE FLUSH 0.9 % IV SOLN
5 mL | INTRAVENOUS | Status: CN | PRN
Start: 2016-11-09 — End: ?

## 2016-11-09 MED ORDER — ASPIRIN 81 MG PO CHEW
81 mg | Freq: Once | ORAL | Status: CN
Start: 2016-11-09 — End: ?

## 2016-11-09 MED ORDER — SODIUM CHLORIDE 0.9 % IV SOLN
INTRAVENOUS | Status: CN
Start: 2016-11-09 — End: ?

## 2016-11-09 NOTE — Progress Notes
Endocrine: (as reviewed in HPI).  No heat or cold intolerance.  Genitourinary:  No dysuria or hematuria + nocturia   Hematologic:  No easy bruising.    Vital Signs     Vitals:    11/03/16 1253   BP: 134/80   Pulse: 80   Resp: 16   Weight: 85.7 kg (189 lb)   Height: 1.778 m ( )     Physical Exam   Constitution:   1-Vital sign as reviewed in the chart   2-Appearance: Well nourished, Normocephalic.   Cardiovascular:   1-Heart auscultation: regular rhythm no appreciate murmur.   3-Lower extremities: no edema   Right foot 2d toes ulcer   Respiratory:   1-Respiratory effort: No labored breathing.   2-Auscultation: lungs are clear to auscultation bilaterally;no wheezing, rhonchi or rales.   Musculosketetal:   1-Gait & Station: Normal gait and station, gait is smooth and coordinated.   Psychiatric:   1-Orientation: Patient appears well oriented to place, person and time.   2-Mood & affect: Patient appears in normal mood & affect, no agitation, anxiety or blunt affect.    Assessment   1-DM type 2, uncontrolled  2-Hypertension  3-Hyperlipidemia  4 neuropathy.      Plan   a1c 9.0    Downloaded pump and reviewed data   Cont   Basal rate 0.8  midnight till 7 am   Basal 1   Carb ratio 1/15    1 for ecvery 50 > 100   Restart metformin     Orders Placed This Encounter   Procedures   ? Basic Metabolic Panel     Medication orders placed this encounter   Medications   ? metFORMIN (GLUCOPHAGE) 1000 MG PO Tablet     Sig: Take 1 tablet by mouth 2 times daily (with meals). SrCr: 1.60* mg/dl (91/47 8295) [last value]     Dispense:  60 tablet     Refill:  6      Hypertension BP ok       F/u 1  months

## 2016-11-09 NOTE — Progress Notes
?   WHITE BLOOD CELL COUNT 10/05/2016 5.2    ? RBC 10/05/2016 4.58    ? Hemoglobin 10/05/2016 15.2    ? Hematocrit 10/05/2016 44.6    ? MCV 10/05/2016 97.4    ? [facility] 10/05/2016 33.2*   ? MCHC 10/05/2016 34.1    ? RDW 10/05/2016 12.3    ? Platelets 10/05/2016 221    ? MPV 10/05/2016 10.4    ? Neutrophils Absolute 10/05/2016 3318    ? Lymphocytes Absolute 10/05/2016 1097    ? Monocytes Absolute 10/05/2016 655    ? Eosinophils Absolute 10/05/2016 99    ? Basophils Absolute 10/05/2016 31    ? Neutrophils 10/05/2016 63.8    ? LYMPHOCYTES 10/05/2016 21.1    ? MONOCYTES 10/05/2016 12.6    ? EOSINOPHILS 10/05/2016 1.9    ? BASOPHILS 10/05/2016 0.6    ? TSH, 3RD GENERATION 10/05/2016 2.30    ? Hemoglobin A1C 10/05/2016 9.0*   ? EAG (MG/DL) 11/91/4782 956    ? EAG (MMOL/L) 10/05/2016 11.7    ? Cholesterol, Total 10/05/2016 119    ? HDL 10/05/2016 53    ? Triglycerides 10/05/2016 51    ? LDL Cholesterol 10/05/2016 53    ? LDl/HDL Ratio 10/05/2016 2.2    ? NON-HDL CHOLESTEROL 10/05/2016 66    Documentation Encounter on 06/01/2016   Component Date Value   ? Colonoscopy Comment 11/07/2012 Report available under Encounter Tab, DOS 01/10/13, done at [facility] by DR. Shaune Spittle, recommended 26yr follow up.           Personal Hx   Denied Alcohol Use  Never A Smoker    ROS   Constitution: weight same     no  fever chills or night sweat. Energy level fair  for stated age.  Eyes:no  vision problem.  Cardiovascular:  Denies any acute chest pain at rest or with exertion.  No lower extremities edema. No shortness of breath with minimal or moderate exertion.  Respiratory:  No  coughing hemoptysis or wheezing.  Gastrointestinal: No history of nausea, vomiting,  yes heartburn,  no diarrhea, constipation, or abdominal pain. + gerd   Neurological: No  memory problem +   feet numbness or burning sensation. Musculoskeletal: No muscles aching or joints aching.  Psychiatric:  No sign of depression, no  irritability or nervousness.

## 2016-11-09 NOTE — Progress Notes
HYDROcodone-acetaminophen (NORCO) 5-325 MG PO Tablet Take 1 tablet by mouth every 6 hours as needed for pain. Earliest Fill Date: 11/05/16 11/05/16  Yes Gearlean Alf, MD   insulin aspart (NovoLOG) vial 100 units daily via insulin pump 11/14/15  Yes Harlin Rain, MD   lidocaine (XYLOCAINE) 2 % EX Gel Apply topically as needed 11/01/16  Yes Harris, Richard C III, DPM   lidocaine-prilocaine (EMLA) 2.5-2.5 % EX Cream Apply to feet once a day 08/19/16  Yes [name], [name], MD   LORazepam (ATIVAN) 1 MG PO Tablet 1 pill hs pr n insomnia 10/05/16  Yes [name], [name], MD   losartan-hydroCHLOROthiazide (HYZAAR) 100-25 MG PO Tablet Take 50/12.5 mg (1/2 tablet) by mouth 2 times daily. 08/02/16  Yes [name], [name], MD   metoprolol succinate (TOPROL-XL) 25 MG PO Tablet Extended Release 24 Hour Take 1 tablet by mouth 2 times daily. 07/05/16  Yes [name], [name], MD   omeprazole (PriLOSEC) 40 MG Capsule Delayed Release Take 1 capsule by mouth daily. 10/04/14  Yes Schuyler Amor, ARNP   sildenafil (VIAGRA) 100 MG PO Tablet Take 1 tablet by mouth as needed for erectile dysfunction. 07/05/16  Yes [name], [name], MD   sulfamethoxazole-trimethoprim (BACTRIM DS,SEPTRA DS) 800-160 MG PO Tablet Take 1 tablet by mouth 2 times daily for 7 days. 11/01/16 11/08/16 Yes Harris, Richard C III, DPM   tamsulosin (FLOMAX) 0.4 MG PO Capsule Take 1 capsule by mouth daily. 08/02/16  Yes Gearlean Alf, MD     Allergies   No Known Drug Allergies.    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  1-DM type 2, uncontrolled  2-ED  3-Hypertension  4-Hyperlipidemia.    PSH   Neck Surgery.  cabg     Family Hx   Paternal history of Diabetes Mellitus  Paternal history of Essential Hypertension  Family history of thyroid cancer?  no  Family history of thyroid disease? no     Orders Only on 11/01/2016   Component Date Value   ? Culture Result 11/01/2016 Culture negative    ? Culture Result 11/01/2016 Enterobacter cloacae    ? Direct Smear Suggests 11/01/2016 Few neutrophils

## 2016-11-09 NOTE — Progress Notes
Chief Complaint   ? Gary Walters is a 70   year old male here today for follow-up on type 2  diabetes  HPI   admitted in between for toe infection       Type 2 DM    insulin regimen :  Basal rate 0.8  midnight till 7 am   Basal 1   Carb ratio 1/15    1 for ecvery 50 > 100   Off  METFORMIN       Accuchecks still no pattern when bolusing well accuchecks acceptable   Some days no checking no bolusing  Reviewed insulin pump download -  Hypoglycemia requiring assistance no   Changing site every 3-4 days     A1c. 9    Creat  1.6    On atorvastatin. LDL 53  Neuropathy - on gabapentin   Due for eye exam   HTN - on norvasc, metoprolol and losartan   .  Current Meds   Current Outpatient Medications    Medication Sig Start Date End Date Taking? Authorizing Provider   amLODIPine (NORVASC) 10 MG PO Tablet Take 5 mg (1/2 tablet) by mouth 2 times daily. 10/10/16  Yes Suryadevara, Germaine Pomfret, MD   atorvastatin (LIPITOR) 40 MG Tablet TAKE ONE TABLET BY MOUTH ONCE DAILY 06/02/16  Yes [name], [name], MD   benzonatate (TESSALON) 200 MG PO Capsule TAKE ONE CAPSULE BY MOUTH THREE TIMES DAILY AS NEEDED 07/05/16  Yes [name], [name], MD   cephALEXin (KEFLEX) 500 MG PO Capsule  10/22/16  Yes Information, Historical   cetirizine (ZyrTEC) 10 MG Tablet Take 5 mg (1/2 tablet) by mouth daily. 10/17/15  Yes Malcolm Metro, MD   cloNIDine (CATAPRES) 0.1 MG PO Tablet Take 1 tablet by mouth 2 times daily as needed for severe hypertension. 10/13/16  Yes Suryadevara, Germaine Pomfret, MD   clopidogrel (PLAVIX) 75 MG PO Tablet TAKE ONE TABLET BY MOUTH ONCE DAILY 07/29/16  Yes [name], [name], MD   diclofenac (VOLTAREN GEL) 1 % TD Gel Apply 2 g topically 4 times daily. **Measure dose using enclosed dose card** 08/02/16  Yes [name], [name], MD   gabapentin (NEURONTIN) 300 MG PO Capsule Take 1 capsule by mouth 3 times daily. 09/22/16  Yes Harlin Rain, MD   glucose blood (BAYER CONTOUR TEST) VI Strip 1 strip as instructed 5 times daily. 10/01/16  Yes Harlin Rain, MD

## 2016-11-09 NOTE — Progress Notes
?   Direct Smear Suggests 11/01/2016 No squamous epithelial cells    ? Direct Smear Suggests 11/01/2016 Gram negative rods    Office Visit on 10/05/2016   Component Date Value   ? Hemoglobin A1C, POC 10/05/2016 8.8    ? MICROALBUMIN 10/05/2016 2.4    ? RAM 10/05/2016     ? CREATININE 10/05/2016 129.4    ? [place] 10/05/2016 6.41    ? OXIDANT 10/05/2016 NEGATIVE    ? AMPHETAMINES 10/05/2016 NEGATIVE    ? BARBITURATES 10/05/2016 NEGATIVE    ? BENZODIAZEPINES 10/05/2016 POSITIVE*   ? ALPHAHYDROXYALPRAZOLAM 10/05/2016 NEGATIVE    ? Alphahydroxymidazolam 10/05/2016 NEGATIVE    ? ALPHAHYDROXYTRIAZOLAM 10/05/2016 NEGATIVE    ? AMINOCLONAZEPAM 10/05/2016 NEGATIVE    ? Hydroxyethylflurazepam 10/05/2016 NEGATIVE    ? LORAZEPAM 10/05/2016 230*   ? NORDIAZEPAM 10/05/2016 NEGATIVE    ? OXAZEPAM 10/05/2016 NEGATIVE    ? TEMAZEPAM 10/05/2016 NEGATIVE    ? MARIJUANA METABOLITE 10/05/2016 NEGATIVE    ? COCAINE METABOLITE 10/05/2016 NEGATIVE    ? METHADONE 10/05/2016 NEGATIVE    ? OPIATES 10/05/2016 POSITIVE*   ? CODEINE 10/05/2016 NEGATIVE    ? HYDROCODONE 10/05/2016 504*   ? HYDROMORPHONE 10/05/2016 339*   ? MORPHINE 10/05/2016 NEGATIVE    ? Norhydrocodone 10/05/2016 936*   ? OXYCODONE 10/05/2016 NEGATIVE    ? PHENCYCLIDINE 10/05/2016 NEGATIVE    ? COMMENT 10/05/2016     Office Visit on 09/22/2016   Component Date Value   ? Glucose 10/05/2016 235*   ? Urea Nitrogen 10/05/2016 30*   ? Creatinine 10/05/2016 1.60*   ? EGFR 10/05/2016 43*   ? Glom Filt Rate, Est Afri* 10/05/2016 50*   ? BUN/Creatinine Ratio 10/05/2016 19    ? Sodium 10/05/2016 136    ? Potassium 10/05/2016 5.5*   ? Chloride 10/05/2016 102    ? CARBON DIOXIDE 10/05/2016 27    ? Calcium 10/05/2016 9.3    ? Protein, Total 10/05/2016 6.7    ? ALBUMIN 10/05/2016 4.1    ? Globulin 10/05/2016 2.6    ? ALBUMIN/GLOBULIN RATIO 10/05/2016 1.6    ? Total Bilirubin 10/05/2016 0.6    ? Alkaline Phosphatase 10/05/2016 80    ? AST 10/05/2016 19    ? ALT 10/05/2016 22

## 2016-11-10 DIAGNOSIS — N400 Enlarged prostate without lower urinary tract symptoms: Secondary | ICD-10-CM

## 2016-11-10 DIAGNOSIS — L97509 Non-pressure chronic ulcer of other part of unspecified foot with unspecified severity: Secondary | ICD-10-CM

## 2016-11-10 DIAGNOSIS — E1169 Type 2 diabetes mellitus with other specified complication: Principal | ICD-10-CM

## 2016-11-10 DIAGNOSIS — I739 Peripheral vascular disease, unspecified: Secondary | ICD-10-CM

## 2016-11-10 DIAGNOSIS — L97502 Non-pressure chronic ulcer of other part of unspecified foot with fat layer exposed: Secondary | ICD-10-CM

## 2016-11-10 DIAGNOSIS — L03031 Cellulitis of right toe: Secondary | ICD-10-CM

## 2016-11-10 DIAGNOSIS — I2510 Atherosclerotic heart disease of native coronary artery without angina pectoris: Secondary | ICD-10-CM

## 2016-11-10 DIAGNOSIS — E785 Hyperlipidemia, unspecified: Secondary | ICD-10-CM

## 2016-11-10 DIAGNOSIS — I129 Hypertensive chronic kidney disease with stage 1 through stage 4 chronic kidney disease, or unspecified chronic kidney disease: Secondary | ICD-10-CM

## 2016-11-10 DIAGNOSIS — L03115 Cellulitis of right lower limb: Secondary | ICD-10-CM

## 2016-11-10 DIAGNOSIS — Z79899 Other long term (current) drug therapy: Secondary | ICD-10-CM

## 2016-11-10 DIAGNOSIS — E1151 Type 2 diabetes mellitus with diabetic peripheral angiopathy without gangrene: Secondary | ICD-10-CM

## 2016-11-10 DIAGNOSIS — Z951 Presence of aortocoronary bypass graft: Secondary | ICD-10-CM

## 2016-11-10 DIAGNOSIS — R609 Edema, unspecified: Secondary | ICD-10-CM

## 2016-11-10 DIAGNOSIS — K219 Gastro-esophageal reflux disease without esophagitis: Secondary | ICD-10-CM

## 2016-11-10 DIAGNOSIS — M869 Osteomyelitis, unspecified: Secondary | ICD-10-CM

## 2016-11-10 DIAGNOSIS — M868X7 Other osteomyelitis, ankle and foot: Secondary | ICD-10-CM

## 2016-11-10 DIAGNOSIS — Z9889 Other specified postprocedural states: Secondary | ICD-10-CM

## 2016-11-10 DIAGNOSIS — E1122 Type 2 diabetes mellitus with diabetic chronic kidney disease: Secondary | ICD-10-CM

## 2016-11-10 DIAGNOSIS — M79671 Pain in right foot: Secondary | ICD-10-CM

## 2016-11-10 DIAGNOSIS — N183 Chronic kidney disease, stage 3 (moderate): Secondary | ICD-10-CM

## 2016-11-10 DIAGNOSIS — E11621 Type 2 diabetes mellitus with foot ulcer: Secondary | ICD-10-CM

## 2016-11-11 DIAGNOSIS — I129 Hypertensive chronic kidney disease with stage 1 through stage 4 chronic kidney disease, or unspecified chronic kidney disease: Secondary | ICD-10-CM

## 2016-11-11 DIAGNOSIS — E1169 Type 2 diabetes mellitus with other specified complication: Principal | ICD-10-CM

## 2016-11-11 DIAGNOSIS — N400 Enlarged prostate without lower urinary tract symptoms: Secondary | ICD-10-CM

## 2016-11-11 DIAGNOSIS — E785 Hyperlipidemia, unspecified: Secondary | ICD-10-CM

## 2016-11-11 DIAGNOSIS — E1122 Type 2 diabetes mellitus with diabetic chronic kidney disease: Secondary | ICD-10-CM

## 2016-11-11 DIAGNOSIS — M868X7 Other osteomyelitis, ankle and foot: Secondary | ICD-10-CM

## 2016-11-11 DIAGNOSIS — L03115 Cellulitis of right lower limb: Secondary | ICD-10-CM

## 2016-11-11 DIAGNOSIS — E11621 Type 2 diabetes mellitus with foot ulcer: Secondary | ICD-10-CM

## 2016-11-11 DIAGNOSIS — N183 Chronic kidney disease, stage 3 (moderate): Secondary | ICD-10-CM

## 2016-11-11 DIAGNOSIS — Z79899 Other long term (current) drug therapy: Secondary | ICD-10-CM

## 2016-11-11 DIAGNOSIS — K219 Gastro-esophageal reflux disease without esophagitis: Secondary | ICD-10-CM

## 2016-11-11 DIAGNOSIS — I2510 Atherosclerotic heart disease of native coronary artery without angina pectoris: Secondary | ICD-10-CM

## 2016-11-11 DIAGNOSIS — Z951 Presence of aortocoronary bypass graft: Secondary | ICD-10-CM

## 2016-11-11 DIAGNOSIS — E1151 Type 2 diabetes mellitus with diabetic peripheral angiopathy without gangrene: Secondary | ICD-10-CM

## 2016-11-11 DIAGNOSIS — R609 Edema, unspecified: Secondary | ICD-10-CM

## 2016-11-12 DIAGNOSIS — I2510 Atherosclerotic heart disease of native coronary artery without angina pectoris: Secondary | ICD-10-CM

## 2016-11-12 DIAGNOSIS — I129 Hypertensive chronic kidney disease with stage 1 through stage 4 chronic kidney disease, or unspecified chronic kidney disease: Secondary | ICD-10-CM

## 2016-11-12 DIAGNOSIS — I10 Essential (primary) hypertension: Secondary | ICD-10-CM

## 2016-11-12 DIAGNOSIS — K219 Gastro-esophageal reflux disease without esophagitis: Secondary | ICD-10-CM

## 2016-11-12 DIAGNOSIS — N400 Enlarged prostate without lower urinary tract symptoms: Secondary | ICD-10-CM

## 2016-11-12 DIAGNOSIS — E1169 Type 2 diabetes mellitus with other specified complication: Secondary | ICD-10-CM

## 2016-11-12 DIAGNOSIS — R609 Edema, unspecified: Secondary | ICD-10-CM

## 2016-11-12 DIAGNOSIS — C801 Malignant (primary) neoplasm, unspecified: Secondary | ICD-10-CM

## 2016-11-12 DIAGNOSIS — E1151 Type 2 diabetes mellitus with diabetic peripheral angiopathy without gangrene: Secondary | ICD-10-CM

## 2016-11-12 DIAGNOSIS — N183 Chronic kidney disease, stage 3 (moderate): Secondary | ICD-10-CM

## 2016-11-12 DIAGNOSIS — L03115 Cellulitis of right lower limb: Secondary | ICD-10-CM

## 2016-11-12 DIAGNOSIS — E785 Hyperlipidemia, unspecified: Secondary | ICD-10-CM

## 2016-11-12 DIAGNOSIS — Z951 Presence of aortocoronary bypass graft: Secondary | ICD-10-CM

## 2016-11-12 DIAGNOSIS — E119 Type 2 diabetes mellitus without complications: Principal | ICD-10-CM

## 2016-11-12 DIAGNOSIS — E11621 Type 2 diabetes mellitus with foot ulcer: Secondary | ICD-10-CM

## 2016-11-12 DIAGNOSIS — N189 Chronic kidney disease, unspecified: Secondary | ICD-10-CM

## 2016-11-12 DIAGNOSIS — Z79899 Other long term (current) drug therapy: Secondary | ICD-10-CM

## 2016-11-12 DIAGNOSIS — Z01810 Encounter for preprocedural cardiovascular examination: Principal | ICD-10-CM

## 2016-11-12 DIAGNOSIS — E1122 Type 2 diabetes mellitus with diabetic chronic kidney disease: Secondary | ICD-10-CM

## 2016-11-12 DIAGNOSIS — M868X7 Other osteomyelitis, ankle and foot: Secondary | ICD-10-CM

## 2016-11-15 ENCOUNTER — Inpatient Hospital Stay: Admit: 2016-11-15 | Discharge: 2016-11-16 | Primary: Family Medicine

## 2016-11-15 ENCOUNTER — Ambulatory Visit: Admit: 2016-11-15 | Discharge: 2016-11-16 | Primary: Family Medicine

## 2016-11-15 ENCOUNTER — Ambulatory Visit

## 2016-11-15 ENCOUNTER — Inpatient Hospital Stay

## 2016-11-15 DIAGNOSIS — N189 Chronic kidney disease, unspecified: Secondary | ICD-10-CM

## 2016-11-15 DIAGNOSIS — E1169 Type 2 diabetes mellitus with other specified complication: Principal | ICD-10-CM

## 2016-11-15 DIAGNOSIS — K219 Gastro-esophageal reflux disease without esophagitis: Secondary | ICD-10-CM

## 2016-11-15 DIAGNOSIS — E1122 Type 2 diabetes mellitus with diabetic chronic kidney disease: Secondary | ICD-10-CM

## 2016-11-15 DIAGNOSIS — Z951 Presence of aortocoronary bypass graft: Secondary | ICD-10-CM

## 2016-11-15 DIAGNOSIS — M868X7 Other osteomyelitis, ankle and foot: Secondary | ICD-10-CM

## 2016-11-15 DIAGNOSIS — N400 Enlarged prostate without lower urinary tract symptoms: Secondary | ICD-10-CM

## 2016-11-15 DIAGNOSIS — E11621 Type 2 diabetes mellitus with foot ulcer: Secondary | ICD-10-CM

## 2016-11-15 DIAGNOSIS — N183 Chronic kidney disease, stage 3 (moderate): Secondary | ICD-10-CM

## 2016-11-15 DIAGNOSIS — I2510 Atherosclerotic heart disease of native coronary artery without angina pectoris: Secondary | ICD-10-CM

## 2016-11-15 DIAGNOSIS — E1151 Type 2 diabetes mellitus with diabetic peripheral angiopathy without gangrene: Secondary | ICD-10-CM

## 2016-11-15 DIAGNOSIS — R609 Edema, unspecified: Secondary | ICD-10-CM

## 2016-11-15 DIAGNOSIS — I129 Hypertensive chronic kidney disease with stage 1 through stage 4 chronic kidney disease, or unspecified chronic kidney disease: Secondary | ICD-10-CM

## 2016-11-15 DIAGNOSIS — I10 Essential (primary) hypertension: Secondary | ICD-10-CM

## 2016-11-15 DIAGNOSIS — C801 Malignant (primary) neoplasm, unspecified: Secondary | ICD-10-CM

## 2016-11-15 DIAGNOSIS — Z79899 Other long term (current) drug therapy: Secondary | ICD-10-CM

## 2016-11-15 DIAGNOSIS — E785 Hyperlipidemia, unspecified: Secondary | ICD-10-CM

## 2016-11-15 DIAGNOSIS — L03115 Cellulitis of right lower limb: Secondary | ICD-10-CM

## 2016-11-15 DIAGNOSIS — E119 Type 2 diabetes mellitus without complications: Principal | ICD-10-CM

## 2016-11-15 MED ORDER — ATORVASTATIN CALCIUM 40 MG PO TABS
40 mg | Freq: Every day | ORAL | Status: DC
Start: 2016-11-15 — End: 2016-11-16

## 2016-11-15 MED ORDER — NALOXONE HCL 0.4 MG/ML IJ SOLN
.4 mg | INTRAVENOUS | Status: CP | PRN
Start: 2016-11-15 — End: ?

## 2016-11-15 MED ORDER — BUPIVACAINE HCL (PF) 0.5 % IJ SOLN
Status: DC | PRN
Start: 2016-11-15 — End: 2016-11-15

## 2016-11-15 MED ORDER — EPHEDRINE SULFATE-NACL (PF) 5-0.9 MG/ML-% IV SOSY UF
Status: DC | PRN
Start: 2016-11-15 — End: 2016-11-15

## 2016-11-15 MED ORDER — SODIUM CHLORIDE 0.9 % IV SOLN
1000 mL | INTRAVENOUS | Status: DC
Start: 2016-11-15 — End: 2016-11-16

## 2016-11-15 MED ORDER — LOSARTAN POTASSIUM 50 MG PO TABS
100 mg | Freq: Every day | ORAL | Status: DC
Start: 2016-11-15 — End: 2016-11-16

## 2016-11-15 MED ORDER — TRAMADOL HCL 50 MG PO TABS
50 mg | Freq: Once | ORAL | Status: CP
Start: 2016-11-15 — End: ?

## 2016-11-15 MED ORDER — CLOPIDOGREL BISULFATE 75 MG PO TABS
75 mg | Freq: Every day | ORAL | Status: DC
Start: 2016-11-15 — End: 2016-11-16

## 2016-11-15 MED ORDER — LACTATED RINGERS IV SOLN
Status: DC | PRN
Start: 2016-11-15 — End: 2016-11-15

## 2016-11-15 MED ORDER — LACTATED RINGERS IV SOLN
1000 mL | Freq: Once | INTRAVENOUS | Status: DC
Start: 2016-11-15 — End: 2016-11-16

## 2016-11-15 MED ORDER — CEPHALEXIN 500 MG PO CAPS
500 mg | Freq: Two times a day (BID) | ORAL | 0 refills | Status: CP
Start: 2016-11-15 — End: 2016-11-22

## 2016-11-15 MED ORDER — CLONIDINE HCL 0.1 MG PO TABS
.1 mg | Freq: Two times a day (BID) | ORAL | Status: DC
Start: 2016-11-15 — End: 2016-11-16

## 2016-11-15 MED ORDER — INDOCYANINE GREEN 25 MG IV SOLR
Status: DC | PRN
Start: 2016-11-15 — End: 2016-11-15

## 2016-11-15 MED ORDER — OXYCODONE HCL 5 MG PO TABS
10 mg | Freq: Once | ORAL | Status: DC | PRN
Start: 2016-11-15 — End: 2016-11-16

## 2016-11-15 MED ORDER — GABAPENTIN 300 MG PO CAPS
300 mg | Freq: Three times a day (TID) | ORAL | Status: DC
Start: 2016-11-15 — End: 2016-11-16

## 2016-11-15 MED ORDER — LABETALOL HCL 5 MG/ML IV SOLN
10 mg | INTRAVENOUS | Status: CP | PRN
Start: 2016-11-15 — End: ?

## 2016-11-15 MED ORDER — PANTOPRAZOLE SODIUM 40 MG PO TBEC
40 mg | Freq: Every morning | ORAL | Status: DC
Start: 2016-11-15 — End: 2016-11-16

## 2016-11-15 MED ORDER — HYDROCHLOROTHIAZIDE 25 MG PO TABS
25 mg | Freq: Every day | ORAL | Status: DC
Start: 2016-11-15 — End: 2016-11-16

## 2016-11-15 MED ORDER — ASPIRIN 81 MG PO CHEW
81 mg | Freq: Every day | ORAL | Status: DC
Start: 2016-11-15 — End: 2016-11-16

## 2016-11-15 MED ORDER — PROPOFOL 10 MG/ML IV EMUL CUSTOM SH
Status: DC | PRN
Start: 2016-11-15 — End: 2016-11-15

## 2016-11-15 MED ORDER — TAMSULOSIN HCL 0.4 MG PO CAPS
.4 mg | Freq: Every day | ORAL | Status: DC
Start: 2016-11-15 — End: 2016-11-16

## 2016-11-15 MED ORDER — ACETAMINOPHEN 500 MG PO TABS
1000 mg | Freq: Once | ORAL | Status: DC
Start: 2016-11-15 — End: 2016-11-16

## 2016-11-15 MED ORDER — GLUCOSE 4 G PO CHEW JX
16 g | ORAL | Status: DC | PRN
Start: 2016-11-15 — End: 2016-11-16

## 2016-11-15 MED ORDER — HYDROCODONE-ACETAMINOPHEN 5-325 MG PO TABS
1 | ORAL_TABLET | Freq: Four times a day (QID) | ORAL | Status: DC | PRN
Start: 2016-11-15 — End: 2016-11-16

## 2016-11-15 MED ORDER — FENTANYL CITRATE INJ 50 MCG/ML CUSTOM AMP/VIAL
Status: DC | PRN
Start: 2016-11-15 — End: 2016-11-15

## 2016-11-15 MED ORDER — MIDAZOLAM HCL 2 MG/2ML IJ SOLN
Status: DC | PRN
Start: 2016-11-15 — End: 2016-11-15

## 2016-11-15 MED ORDER — CELECOXIB 200 MG PO CAPS
400 mg | Freq: Once | ORAL | Status: DC
Start: 2016-11-15 — End: 2016-11-16

## 2016-11-15 MED ORDER — DEXTROSE 50 % IV SOLN
30 mL | INTRAVENOUS | Status: DC | PRN
Start: 2016-11-15 — End: 2016-11-16

## 2016-11-15 MED ORDER — LIDOCAINE HCL (PF) 1 % IJ SOLN SH
INTRAVENOUS | Status: DC | PRN
Start: 2016-11-15 — End: 2016-11-15

## 2016-11-15 MED ORDER — AMLODIPINE BESYLATE 5 MG PO TABS
5 mg | Freq: Every day | ORAL | Status: DC
Start: 2016-11-15 — End: 2016-11-16

## 2016-11-15 MED ORDER — HALOPERIDOL LACTATE 5 MG/ML IJ SOLN
.5 mg | Freq: Once | INTRAVENOUS | Status: DC | PRN
Start: 2016-11-15 — End: 2016-11-16

## 2016-11-15 MED ORDER — HYDRALAZINE HCL 20 MG/ML IJ SOLN
10 mg | INTRAVENOUS | Status: DC | PRN
Start: 2016-11-15 — End: 2016-11-16

## 2016-11-15 MED ORDER — HEPARIN SODIUM (PORCINE) 5000 UNIT/ML IJ SOLN
5000 [IU] | Freq: Two times a day (BID) | SUBCUTANEOUS | Status: DC
Start: 2016-11-15 — End: 2016-11-16

## 2016-11-15 MED ORDER — GABAPENTIN 300 MG PO CAPS
600 mg | Freq: Once | ORAL | Status: DC
Start: 2016-11-15 — End: 2016-11-16

## 2016-11-15 MED ORDER — HYDROCODONE-ACETAMINOPHEN 5-325 MG PO TABS
1 | ORAL_TABLET | ORAL | 0 refills | Status: CP | PRN
Start: 2016-11-15 — End: 2017-01-19

## 2016-11-15 MED ORDER — CEFAZOLIN SODIUM 1 G IJ SOLR
2 g | Freq: Once | INTRAVENOUS | Status: CP
Start: 2016-11-15 — End: ?

## 2016-11-15 MED ORDER — NALBUPHINE HCL 10 MG/ML IJ SOLN
2.5 mg | Freq: Once | INTRAVENOUS | Status: DC | PRN
Start: 2016-11-15 — End: 2016-11-16

## 2016-11-15 MED ORDER — ACETAMINOPHEN 325 MG PO TABS
650 mg | ORAL | Status: DC | PRN
Start: 2016-11-15 — End: 2016-11-16

## 2016-11-15 MED ORDER — HYDRALAZINE HCL 20 MG/ML IJ SOLN
5 mg | INTRAVENOUS | Status: CP | PRN
Start: 2016-11-15 — End: ?

## 2016-11-15 MED ORDER — METOPROLOL TARTRATE 25 MG PO TABS
25 mg | Freq: Two times a day (BID) | ORAL | Status: DC
Start: 2016-11-15 — End: 2016-11-16

## 2016-11-15 NOTE — Nursing Post-Procedure Note
PT. FULLY RECOVERED, L FA IV SITE LEFT IN PLACE, PT. D/C'D FROM PACU AND TRANSPORTED TO CATH. LAB FOR R LE ANGIOGRAM. REPORT GIVEN TO CATH LAB RN PENNY, TO D/C IV SITE PER POLICY WHEN PROCEDURE COMPLETE.

## 2016-11-15 NOTE — Immediate Post-Operative Note
[facility]  Immediate Post-Operative Note      Case Record #: [id number]  Gary Walters  August 25, 1945, 71 y.o., male    Case Date: 11/15/2016    Staff:  Surgeon(s) and Role:     * Piraino, Windy Carina, DPM - Primary     * Feliberto Gottron, [place] - Resident - Assisting  Anesthesiologist: Cyndie Mull, MD  CRNA: Luan Pulling, Janie Morning, CRNA      Pre-Op Diagnosis:   Peripheral edema [R60.9]  Diabetic foot ulcer with osteomyelitis [E11.621, E11.69, L97.509, M86.9]  Right foot pain [M79.671]  Cellulitis of toe of right foot [L03.031]  Diabetes mellitus with peripheral vascular disease [E11.51]  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, with fat layer exposed, unspecified laterality [E11.621, L97.502]    Procedure(s):  28820 - AMPUTATION TOE METATARSOPHALANGEAL JOINT, right 2nd toe amputation at level of PIPJ (Right)    Post-Op Diagnosis Codes:     * Peripheral edema [R60.9]     * Diabetic foot ulcer with osteomyelitis [E11.621, E11.69, L97.509, M86.9]     * Right foot pain [M79.671]     * Cellulitis of toe of right foot [L03.031]     * Diabetes mellitus with peripheral vascular disease [E11.51]     * 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 A Resident/ACGME fellow participated in this case: Yes   2. Participating Resident/ACGME fellow was qualified for case type: Yes    3. Teaching surgeon is present entire case (including opening & closing): Yes          Findings/Description: Chronic nonhealing ulceration of right second distal toe with interval partial amputation distal to the level of the PIPJ and intraoperative Spy Microangiography.    Specimens:     SPECIMENS     ID Source Type Lat. Tests [id number] Indication F/Z? Collect By [employer] Date/Time Destination Time in Formalin ClinicalHx Nurse    A Toe(s), amputation, non-trauma In Formalin Right ? SURG PATH SPECIMEN

## 2016-11-15 NOTE — Immediate Post-Operative Note
Pathology [phone])  [name], DPM 11/15/16 1241 OR Specimen Refrigerator (then to Pathology)  DIABETIC FOOT ULCER BASS, AMANDA    Description: RIGHT FOOT, 2ND DISTAL TOE            SCD's applied: yes    EBL: 1 mls    Patient tolerated procedure and anesthesia well.   Patient transferred to PACU, with vital signs stable and neurovascular status intact to right foot.   Patient will be discharged home, once vital signs are stable and PACU protocol met   Keep dressing clean dry and intact until first postoperative visit  Heel WB to the right foot   IS x 10/hour per nursing instructions   Pain control: Norco 5-367m # 30  Can resume normal diet and medications from [place] continue to follow on an outpatient basis    Dictation#:

## 2016-11-15 NOTE — H&P
greater than one bilaterally.  In summary, no significant arterial occlusive disease of the   lower extremities.    Echo 2016  1. Overall left ventricular systolic function is normal with, an EF between 55 - 60 %. There is  ?distal septal ?hypokinesis. Borderline LVH  2. The left atrium is mild to moderately dilated.  3. The aortic root is dilated measuring 4.  4. The pulmonary artery is mild/ moderately dilated (3.1).    Cath 05/2015  NATIVE CORONARY ANGIOGRAPHY:  Left Main Coronary Artery: The LMCA has 30% distal stenosis  Left Anterior Descending Artery: The LAD has diffuse disease in the proximal and mid  segments with up to 80% stenosis and competitive flow distally. D1 has 90% ostial  stenosis. D2 has 95% proximal stenosis  Ramus Intermedius: The ramus intermedius is very small and patent  Left Circumflex Artery: The LCx has 80% proximal stenosis. OM1 was not visualized well  due to competitive flow  Right Coronary Artery: The RCA is dominant and the vessel has diffuse disease in the mid  and distal segments with up to 80% stenosis. The PDA and PLA were not visualized well due  to competitive flow    GRAFT ANGIOGRAPHY:  LIMA graft to the LAD: The graft is patent and there is no significant disease in the  distal LAD  SVG to OM1: The graft is patent but there is 90% stenosis at the anastomosis  SVG to the PDA: The graft is patent but there is 70% distal PDA stenosis    PROCEDURAL COMPLICATIONS: None    HEMOSTASIS: TR band    CONCLUSIONS:  1. Severe 3V CAD  2. Patent LIMA graft to the LAD  3. Patent SVG to OM1 with 90% stenosis at the anastomosis  4. Patent SVG to the PDA with 70% distal PDA stenosis    ASSESSMENT:  1. CAD without angina at this time  2. Diastolic dysfunction  3. 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  Plan  1. Critical Limb ischemia s/p amputation of Right2nd tow with podiatry today and plan for angiography with Dr. Myrtie Hawk

## 2016-11-15 NOTE — Anesthesia Preprocedure Evaluation
Treatment included coronary artery bypass graft, antiplatelet therapy, beta blocker therapy and lipid-lowering statin medication prescribed. The patient has CAD history. Cardiac risks include: CAD. Cardiac risk index score: 1.   GI/Hepatic: acid reflux. GERD controlled with medication. The GERD occurs daily.   Renal: The patient has a history of chronic renal disease. Renal diseases type: CRI. BPH   Endo/Other: diabetes. The patient has type 2 diabetes. The age of onset of the diabetes was > 48 years of age. The patient uses Oral hypoglycemics to control the diabetes. The patient has a history of osteoarthritis. The patient has a history of infectious disease. Types include: MRSA.     Osteomyelitis, cellulitis right foot, right foot ulcer      Physical Exam:   Airway: Physical exam of airway reveals, mouth opening of >3 FB with limited neck ROM, Mallampati score of III.   GCS: Patients total GCS score is 15, with 4 for eye response, with 5 for verbal response, and with 6 for motor response.  Obesity: obesity.    Dental:         Other Findings: PERL, no jaundice; ENT-non tender, no erythema no nodes; Neck-supple, no JVD, no nodes, palpable plate, Cardiac RR, no tachycardia, no bradycardia; Respiratory BBS clear, no wheezing, occasional rales; Abd BS+ soft, non-tender, pump right abd, Ext Pedal pulse, no edema, good grip; Neuro alert, oriented      Anesthesia Plan:        Plan for anesthesia technique is general.  The vascular access plan include the use of peripheral. The following pulse oximetry, body tempature, ETC02, NIBP and 6-lead EKG will be used for intraoperative monitoring.       Anesthesia Attending:

## 2016-11-15 NOTE — Progress Notes
Pt arrived in cath holding from PACU approx 1430 in care of [facility].  However d/t inability to register pt to this area, unable to assign to pre-op until approx 1510 and care handed over to [facility].  Awaiting MD orders for pain med.

## 2016-11-15 NOTE — H&P
pressure, and no evidence of significant valvular heart disease or PH.  3. MPS performed in February 2017 revealed a fixed apical defect.   4. LHC performed in November 2016 revealed 30% distal LMCA stenosis, diffuse proximal and mid LAD disease with up to 80% stenosis, 90% ostial D1 stenosis, 95% proximal D2 stenosis, 80% proximal LCx stenosis, diffuse mid and distal RCA disease with up to 80% stenosis, a patent LIMA graft to the LAD, a patent SVG to OM1 with 90% stenosis at the anastomosis, a patent SVG to the PDA with 70% distal PDA stenosis, and a LVEDP of 18 mmHg. PCI of the SVG to OM1 and the distal PDA was subsequently performed with implantation of Xience 2.5 x 15 mm and Resolute Integrity 2.25 x 18 mm drug-eluting stents.  5. LE segmental doppler ultrasound performed in September 2016 revealed no evidence of significant peripheral vascular disease.   6. Labs drawn in May 2016: LDL 87, HDL 44, triglycerides 73.  7. Labs drawn in October 2016: Hb 14.9, BUN 25, Cr 1.35, K 4.9, albumin 4, TSH 1.06.  8. Labs drawn in February 2017: Hb 13.3, BUN 15, Cr 1.14, K 3.4.  ?  [place] segmental doppler 11/05/2016  Segmental Dopplers show no evidence of aortoiliac occlusive disease.  There is a normal   progression of pressures and waveforms down both legs resulting in ankle-brachial indices   greater than 0.9 bilaterally.  In summary, no significant arterial occlusive disease of the   lower extremities.    [place] segmental doppler 10/30/2014  Segmental Dopplers show no evidence of aortoiliac occlusive disease.  There is a normal   progression of pressures and waveforms down both legs resulting in ankle-brachial indices   greater than one bilaterally.  In summary, no significant arterial occlusive disease of the   lower extremities.    [place] ABI 2013  Segmental Doppler show no evidence of aortoiliac occlusive disease.  There is a normal   progression of pressures and waveforms down both legs resulting in ankle-brachial indices

## 2016-11-15 NOTE — Progress Notes
Pt reports no relief from Tramadol, however, pt is about to go to procedure and will receive sedation.

## 2016-11-15 NOTE — Interval H&P Note
Department of Orthopedics  Division of Podiatry  Pre-Op Interval History and Physical Note    11/15/2016 11:44 AM    Preoperative Diagnosis: Peripheral edema [R60.9]  Diabetic foot ulcer with osteomyelitis [E11.621, E11.69, L97.509, M86.9]  Right foot pain [M79.671]  Cellulitis of toe of right foot [L03.031]  Diabetes mellitus with peripheral vascular disease [E11.51]  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, with fat layer exposed, unspecified laterality [E11.621, L97.502]    Procedure: Procedure(s):  28820 - AMPUTATION TOE METATARSOPHALANGEAL JOINT, right 2nd toe amputation at level of PIPJ (Right)    Surgeon: Moishe Spice) and Role:     * Piraino, Windy Carina, DPM - Primary    H&P documentation: I have examined the patient and there have been no interval changes in the patient's medical condition since the H&P was done. Patient endorses NPO status. Patient wishes to proceed with surgical intervention.      Feliberto Gottron, DPM  4/23/201811:44 AM

## 2016-11-15 NOTE — Progress Notes
Pt arrived to the unit from cath lab.

## 2016-11-15 NOTE — Anesthesia Preprocedure Evaluation
Department of Anesthesiology  Pre-operative Evaluation Record    Patient Name: Gary Walters                  Sex: male                  Age: 71 y.o.                  MRN: [id number]     Allergies:   Nsaids    No outpatient prescriptions have been marked as taking for the 11/12/16 encounter [facility] Encounter) with [facility] PAT OR.       Prior Surgeries:     Past Surgical History:   Procedure Laterality Date   ? CARDIAC CATHERIZATION     ? CERVICAL FUSION     ? CORONARY ANGIOPLASTY     ? CORONARY ARTERY BYPASS GRAFT         Social   ETOH:       Alcohol Use No     Tobacco:   History   Smoking Status   ? Never Smoker   Smokeless Tobacco   ? Never Used     Drugs:   History   Drug Use No       System Evaluation and Medical History:     Anesthetic Complications: Pt has a titanium plate in his neck and cannot be bent back..   Neuro/Psych: The patient suffers from headaches.   Cardiovascular: Cardiac history includes: PVD, hyperlipidemia, hypertension and past MI. The hypertension is well controlled. The hypertension has occurred for > 10 years. The MI occurred on: < 1 month. Treatment included coronary artery bypass graft, antiplatelet therapy, beta blocker therapy and lipid-lowering statin medication prescribed. The patient has CAD history. Cardiac risks include: CAD. Cardiac risk index score: 1.   GI/Hepatic: acid reflux. GERD controlled with medication. The GERD occurs daily.   Renal: The patient has a history of chronic renal disease. Renal diseases type: CRI. BPH   Endo/Other: diabetes. The patient has type 2 diabetes. The age of onset of the diabetes was > 38 years of age. The patient uses Oral hypoglycemics to control the diabetes. The patient has a history of osteoarthritis.     Osteomyelitis, cellulitis right foot, right foot ulcer      Physical Exam:        Anesthesia Plan:            Anesthesia Attending:

## 2016-11-15 NOTE — Anesthesia Preprocedure Evaluation
hypertension has occurred for > 10 years. The MI occurred on: < 1 month. Treatment included coronary artery bypass graft, antiplatelet therapy, beta blocker therapy and lipid-lowering statin medication prescribed. The patient has CAD history. Cardiac risks include: CAD. Cardiac risk index score: 1.   GI/Hepatic: acid reflux. GERD controlled with medication. The GERD occurs daily.   Renal: The patient has a history of chronic renal disease. Renal diseases type: CRI. BPH   Endo/Other: diabetes. The patient has type 2 diabetes. The age of onset of the diabetes was > 56 years of age. The patient uses Oral hypoglycemics to control the diabetes. The patient has a history of osteoarthritis. The patient has a history of infectious disease. Types include: MRSA.     Osteomyelitis, cellulitis right foot, right foot ulcer      Physical Exam:   Airway: Physical exam of airway reveals, mouth opening of >3 FB with limited neck ROM, Mallampati score of III.   GCS: Patients total GCS score is 15, with 4 for eye response, with 5 for verbal response, and with 6 for motor response.  Obesity: underweight.    Dental:         Other Findings: PERL, no jaundice; ENT-non tender, no erythema no nodes; Neck-supple, no JVD, no nodes, palpable plate, Cardiac RR, no tachycardia, no bradycardia; Respiratory BBS clear, no wheezing, occasional rales; Abd BS+ soft, non-tender, pump right abd, Ext Pedal pulse, no edema, good grip; Neuro alert, oriented      Anesthesia Plan:       Patient has an ASA score of 3 and has been NPO for > 8 hours. Plan for anesthesia technique is general.  Anesthetic plan and risks has been discussed with the patient. The vascular access plan include the use of peripheral. The following pulse oximetry, body tempature, ETC02, NIBP and 6-lead EKG will be used for intraoperative monitoring.   Post operative pain will be controled by IV meds.       Anesthesia Attending:

## 2016-11-15 NOTE — Anesthesia Preprocedure Evaluation
Department of Anesthesiology  Pre-operative Evaluation Record    Patient Name: Gary Walters                  Sex: male                  Age: 71 y.o.                  MRN: [id number]     Allergies:   Nsaids    No outpatient prescriptions have been marked as taking for the 11/12/16 encounter [facility] Encounter) with [facility] PAT OR.       Prior Surgeries:     Past Surgical History:   Procedure Laterality Date   ? CARDIAC CATHERIZATION     ? CERVICAL FUSION     ? CORONARY ANGIOPLASTY     ? CORONARY ARTERY BYPASS GRAFT         Social   ETOH:       Alcohol Use No     Tobacco:   History   Smoking Status   ? Never Smoker   Smokeless Tobacco   ? Never Used     Drugs:   History   Drug Use No       System Evaluation and Medical History:     Anesthetic Complications: Pt has a titanium plate in his neck and cannot be bent back..   Neuro/Psych: The patient suffers from headaches.   Cardiovascular: Cardiac history includes: PVD, hyperlipidemia, hypertension and past MI. The hypertension is well controlled. The hypertension has occurred for > 10 years. The MI occurred on: < 1 month. Treatment included coronary artery bypass graft, antiplatelet therapy, beta blocker therapy and lipid-lowering statin medication prescribed. The patient has CAD history. Cardiac risks include: CAD. Cardiac risk index score: 1.   GI/Hepatic: acid reflux. GERD controlled with medication. The GERD occurs daily.   Renal: The patient has a history of chronic renal disease. Renal diseases type: CRI. BPH   Endo/Other: diabetes. The patient has type 2 diabetes. The age of onset of the diabetes was > 22 years of age. The patient uses Oral hypoglycemics to control the diabetes. The patient has a history of osteoarthritis. The patient has a history of infectious disease. Types include: MRSA.     Osteomyelitis, cellulitis right foot, right foot ulcer      Physical Exam:        Anesthesia Plan:            Anesthesia Attending:

## 2016-11-15 NOTE — Immediate Post-Operative Note
Pathology [phone])  [name], DPM 11/15/16 1241 OR Specimen Refrigerator (then to Pathology)  DIABETIC FOOT ULCER Gary Walters, Gary Walters    Description: RIGHT FOOT, 2ND DISTAL TOE            SCD's applied: yes    EBL: 1 mls    Patient tolerated procedure and anesthesia well.   Patient transferred to PACU, with vital signs stable and neurovascular status intact to right foot.   Patient will be discharged home, once vital signs are stable and PACU protocol met   Keep dressing clean dry and intact until first postoperative visit  Heel WB to the right foot   IS x 10/hour per nursing instructions   Pain control: Norco 5-367m # 30  Can resume normal diet and medications from [place] on an outpatient basis    Dictation#:

## 2016-11-15 NOTE — Anesthesia Preprocedure Evaluation
Department of Anesthesiology  Pre-operative Evaluation Record    Patient Name: Gary Walters                  Sex: male                  Age: 71 y.o.                  MRN: [id number]     Allergies:   Nsaids    No outpatient prescriptions have been marked as taking for the 11/12/16 encounter [facility] Encounter) with [facility] PAT OR.       Prior Surgeries:     Past Surgical History:   Procedure Laterality Date   ? CARDIAC CATHERIZATION     ? CERVICAL FUSION     ? CORONARY ANGIOPLASTY     ? CORONARY ARTERY BYPASS GRAFT         Social   ETOH:       Alcohol Use No     Tobacco:   History   Smoking Status   ? Never Smoker   Smokeless Tobacco   ? Never Used     Drugs:   History   Drug Use No       System Evaluation and Medical History:   Outstanding issue identified S/p cervical fusion     History of Anesthetic complication: History of Cervical Fusion and had manipulation that broke two areas and now has titanium plate in throat. And had experience with awaken during surgery.    Insulin Pump-cut off when awakens, eat prior to midnight Nothing to eat or drink after midnight but meds with small sip water/gatorade green.    Dr. Jerene Bears, Cardiologist Acceptable moderat risk to proceed with right foot surgery-see Media for current cardiac diagnostic plan follow up. (Discussed with Audree Bane, MD and OK to proceed)   HPI: 71 year old male with peripheral edema, Diabetic foot ulcer with osteomyelitis scheduled for Amputation Toe Metatarsophaleangeal joint, right 2nd toe amputation at level of PIPJ right 4/23.    History of Anesthetic complication: History of Cervical Fusion and had manipulation that broke two areas and now has titanium plate in throat. And had experience with awaken during surgery.    History of Cervical spine limited mobility, Cervical Stenosis s/p Cervical Fusion, s/p Titanium plate-anterior, h/o Closed rib fx,  Acute Bronchitis (2012), URI, Cough (2012), Chronic Headaches, (Polyneuropathy) Peripheral

## 2016-11-15 NOTE — Immediate Post-Operative Note
[facility]  Immediate Post-Operative Note      Case Record #: [id number]  Gary Walters  [birth year], 71 y.o., male    Case Date: 11/15/2016    Staff:  Surgeon(s) and Role:     * Piraino, Windy Carina, DPM - Primary     * Feliberto Gottron, [place] - Resident - Assisting  Anesthesiologist: Cyndie Mull, MD  CRNA: Luan Pulling, Janie Morning, CRNA      Pre-Op Diagnosis:   Peripheral edema [R60.9]  Diabetic foot ulcer with osteomyelitis [E11.621, E11.69, L97.509, M86.9]  Right foot pain [M79.671]  Cellulitis of toe of right foot [L03.031]  Diabetes mellitus with peripheral vascular disease [E11.51]  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, with fat layer exposed, unspecified laterality [E11.621, L97.502]    Procedure(s):  28820 - AMPUTATION TOE METATARSOPHALANGEAL JOINT, right 2nd toe amputation at level of PIPJ (Right)    Post-Op Diagnosis Codes:     * Peripheral edema [R60.9]     * Diabetic foot ulcer with osteomyelitis [E11.621, E11.69, L97.509, M86.9]     * Right foot pain [M79.671]     * Cellulitis of toe of right foot [L03.031]     * Diabetes mellitus with peripheral vascular disease [E11.51]     * 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 A Resident/ACGME fellow participated in this case: Yes   2. Participating Resident/ACGME fellow was qualified for case type: Yes    3. Teaching surgeon is present entire case (including opening & closing): Yes          Findings/Description: Chronic nonhealing ulceration of right second distal toe with interval partial amputation distal to the level of the PIPJ and intraoperative Spy Microangiography.    Specimens:     SPECIMENS     ID Source Type Lat. Tests [id number] Indication F/Z? Collect By [employer] Date/Time Destination Time in Formalin ClinicalHx Nurse    A Toe(s), amputation, non-trauma In Formalin Right ? SURG PATH SPECIMEN

## 2016-11-15 NOTE — H&P
Cardiology-Vascular OP H&P Note    HPI: The patient is a 71 yo male with a history of CAD, MI, 3V CABG in October 2015 (LIMA graft to the LAD and SVGs to OM1 and the PDA), PCI of the SVG to OM1 and the distal PDA in November 2016, diastolic dysfunction, DM, labile essential hypertension, dyslipidemia, GERD, CKD, BPH, and chronic pain syndrome. He describes acute stress and severe hypertension over the past few weeks. However, he does not describe fatigue, syncope, lightheadedness, angina, palpitations, undue DOE, orthopnea, PND, LE edema, amaurosis fugax, or claudication.  ??  ROS: Positive for chronic pain. All other systems reviewed and negative.  ??  MEDICAL HISTORY: As stated above.  ??  PERTINENT MEDICATIONS:  1. CLOPIDOGREL 75 mg daily  2. METOPROLOL SUCCINATE 25 mg bid  3. LOSARTAN/HCTZ 50/12.5 mg bid  4. AMLODIPINE 2.5 mg bid  5. ATORVASTATIN 40 mg daily  ??  ALLERGIES: NO KNOWN DRUG ALLERGIES.  ??  FAMILY HISTORY: Negative for premature CAD and cardiomyopathy.  ??  SOCIAL HISTORY: The patient is a nonsmoker and he does not describe alcohol use.  ??  PE:  VS: BP 106/57 - Pulse 69 - Temp 36.5 ?C (97.7 ?F) (Oral)  - Resp 18 - Ht 1.78 m (5' 10.08") - Wt 86 kg (189 lb 9.5 oz) - SpO2 96% - BMI 27.14 kg/m2    GEN: Well-developed male, no distress  HEENT: Conjunctivae pink and moist, no scleral icterus  NECK: No JVD or thyromegaly, carotid pulses 2+ and with normal upstrokes, no carotid bruits  CVS: Pulse regular in rhythm, S1 and S2 normal, 1/6 early-peaking ESM at the RUSB, no rubs  RESP: Clear lung fields, resonant on percussion  EXT: No cyanosis or edema, warm peripheries, pedal pulses 1+  ?  DIAGNOSTIC DATA:  1. EKG performed on 09/18/2015 revealed sinus bradycardia at 59 bpm and no significant abnormalities.   2. TTE performed in February 2017 revealed normal LV size and systolic function (LVEF of 60-65%), diastolic dysfunction with elevated LV filling

## 2016-11-15 NOTE — Anesthesia Preprocedure Evaluation
SVGs to OM1 and the PDA), PCI of the SVG to OM1 and the distal PDA in November 2016, diastolic dysfunction), DM-Post insulin pump placement, (Hemoglobin A1C 9), GERD, CKD stage III, BPH, Chronic pain syndrome, h/o Cutaneous Melanoma, Shingles    EKG  Normal Sinus Rhythm, Septal Infarct, age undetermined  No chest pain, no radiating pain, some shortness of breath with extended-post hole digger activity.  No Family member died from MI prior to age 70/15/16 ECHO  CONCLUSIONS  -----------  1. Overall left ventricular systolic function is normal with, an EF between 55 - 60 %. There is  ?distal septal ?hypokinesis. Borderline LVH  2. The left atrium is mild to moderately dilated.  3. The aortic root is dilated measuring (4.0).  4. The pulmonary artery is mild/ moderately dilated (3.1)    07/2014 Cath  CONCLUSIONS:  1. Severe 3V CAD  2. Patent LIMA graft to the LAD  3. Patent SVG to OM1 with 90% stenosis at the anastomosis  4. Patent SVG to the PDA with 70% distal PDA stenosis  RECOMMENDATIONS:  1. PCI of the SVG to OM1 and the distal PDA  07/2014 P[facility]I  FINAL RESULTS:  1. Successful PCI of the SVG to OM1 with implantation of a Xience DES  (postdilated using a 3.29mm NC balloon)  2. Successful PCI of the distal PDA with implantation of a Resolute Integrity DES    ?     Anesthetic Complications: Pt has a titanium plate in his neck and cannot be bent back..   Neuro/Psych: The patient suffers from headaches.   Cardiovascular: Cardiac history includes: PVD, hyperlipidemia, hypertension and past MI. The hypertension is well controlled. The hypertension has occurred for > 10 years. The MI occurred on: < 1 month. Treatment included coronary artery bypass graft, antiplatelet therapy, beta blocker therapy and lipid-lowering statin medication prescribed. The patient has CAD history. Cardiac risks include: CAD. Cardiac risk index score: 1.   GI/Hepatic: acid reflux. GERD controlled with medication. The GERD occurs daily.

## 2016-11-15 NOTE — Anesthesia Preprocedure Evaluation
hypertension has occurred for > 10 years. The MI occurred on: < 1 month. Treatment included coronary artery bypass graft, antiplatelet therapy, beta blocker therapy and lipid-lowering statin medication prescribed. The patient has CAD history. Cardiac risks include: CAD. Cardiac risk index score: 1.   GI/Hepatic: acid reflux. GERD controlled with medication. The GERD occurs daily.   Renal: The patient has a history of chronic renal disease. Renal diseases type: CRI. BPH   Endo/Other: diabetes. The patient has type 2 diabetes. The age of onset of the diabetes was > 73 years of age. The patient uses Oral hypoglycemics to control the diabetes. The patient has a history of osteoarthritis. The patient has a history of infectious disease. Types include: MRSA.     Osteomyelitis, cellulitis right foot, right foot ulcer      Physical Exam:   Airway: Physical exam of airway reveals, mouth opening of >3 FB with limited neck ROM, Mallampati score of III.   GCS: Patients total GCS score is 15, with 4 for eye response, with 5 for verbal response, and with 6 for motor response.  Obesity: obesity.    Dental:         Other Findings: PERL, no jaundice; ENT-non tender, no erythema no nodes; Neck-supple, no JVD, no nodes, palpable plate, Cardiac RR, no tachycardia, no bradycardia; Respiratory BBS clear, no wheezing, occasional rales; Abd BS+ soft, non-tender, pump right abd, Ext Pedal pulse, no edema, good grip; Neuro alert, oriented      Anesthesia Plan:        Plan for anesthesia technique is general.  The vascular access plan include the use of peripheral. The following pulse oximetry, body tempature, ETC02, NIBP and 6-lead EKG will be used for intraoperative monitoring.       Anesthesia Attending:

## 2016-11-15 NOTE — Anesthesia Preprocedure Evaluation
Department of Anesthesiology  Pre-operative Evaluation Record    Patient Name: Gary Walters                  Sex: male                  Age: 71 y.o.                  MRN: [id number]     Allergies:   Nsaids    No outpatient prescriptions have been marked as taking for the 11/12/16 encounter [facility] Encounter) with [facility] PAT OR.       Prior Surgeries:     Past Surgical History:   Procedure Laterality Date   ? CARDIAC CATHERIZATION     ? CERVICAL FUSION     ? CORONARY ANGIOPLASTY     ? CORONARY ARTERY BYPASS GRAFT         Social   ETOH:       Alcohol Use No     Tobacco:   History   Smoking Status   ? Never Smoker   Smokeless Tobacco   ? Never Used     Drugs:   History   Drug Use No       System Evaluation and Medical History:     HPI: 70 year old male with peripheral edema, Diabetic foot ulcer with osteomyelitis scheduled for Amputation Toe Metatarsophaleangeal joint, right 2nd toe amputation at level of PIPJ right 4/23.    History of Anesthetic complication: Neck    History of Cervical spine limited mobility, Cervical Stenosis s/p Cervical Fusion, h/o Closed rib fx,  Acute Bronchitis (2012), URI, Cough (2012), Chronic Headaches, (Polyneuropathy) Peripheral Neuropathy, Hypertension, Hyperlipidemia, MI (2015), CAD (CABG X 3V CABG in October 2015 (LIMA graft to the LAD and SVGs to OM1 and the PDA), PCI of the SVG to OM1 and the distal PDA in November 2016, diastolic dysfunction), stents, DM-Post insulin pump placement, (Hemoglobin A1C 9), labile essential hypertension, dyslipidemia, GERD, CKD stage III, BPH, chronic pain syndrome, h/o Cutaneous Melanoma, Shingles,     EKG  Normal Sinus Rhythm, Septal Infarct, age undetermined    02/2015 ECHO  CONCLUSIONS  -----------  1. Overall left ventricular systolic function is normal with, an EF between 55 - 60 %. There is  ?distal septal ?hypokinesis. Borderline LVH  2. The left atrium is mild to moderately dilated.  3. The aortic root is dilated measuring (4.0).

## 2016-11-15 NOTE — Anesthesia Preprocedure Evaluation
Obesity: obesity.        Anesthesia Plan:        Plan for anesthesia technique is general.  The vascular access plan include the use of peripheral. The following pulse oximetry, body tempature, ETC02, NIBP and 6-lead EKG will be used for intraoperative monitoring.       Anesthesia Attending:

## 2016-11-15 NOTE — Anesthesia Preprocedure Evaluation
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  No Family member died from MI prior to age 80May 21, 2018 Cardiology Dr. Jonny Ruiz  Problems:  Diabetes, Dyslipidemia, Coronary arteriosclerosis, Cardiovascular stress test abn    02/2015 ECHO  CONCLUSIONS  -----------  1. Overall left ventricular systolic function is normal with, an EF between 55 - 60 %. There is  ?distal septal ?hypokinesis. Borderline LVH  2. The left atrium is mild to moderately dilated.  3. The aortic root is dilated measuring (4.0).  4. The pulmonary artery is mild/ moderately dilated (3.1)    07/2014 Cath  CONCLUSIONS:  1. Severe 3V CAD  2. Patent LIMA graft to the LAD  3. Patent SVG to OM1 with 90% stenosis at the anastomosis  4. Patent SVG to the PDA with 70% distal PDA stenosis  RECOMMENDATIONS:  1. PCI of the SVG to OM1 and the distal PDA  07/2014 PTCI  FINAL RESULTS:  1. Successful PCI of the SVG to OM1 with implantation of a Xience DES  (postdilated using a 3.77mm NC balloon)  2. Successful PCI of the distal PDA with implantation of a Resolute Integrity DES    ?     Anesthetic Complications: Pt has a titanium plate in his neck and cannot be bent back..   Neuro/Psych: The patient suffers from headaches.   Cardiovascular: Cardiac history includes: PVD, hyperlipidemia, hypertension and past MI. The hypertension is well controlled. The

## 2016-11-15 NOTE — Anesthesia Postprocedure Evaluation
Post Anesthesia Eval    Respiratory function appropriate( normal respiratory rate and oxygen saturation, airway patent )  Cardiovascular function appropriate( normal heart rate and blood pressure )  Mental status appropriate  Patient normothermic  Pain well controlled  No uncontrolled nausea and vomiting  Patient appropriately hydrated  No apparent anesthesia complications  Patient tolerated procedure well

## 2016-11-15 NOTE — Anesthesia Preprocedure Evaluation
Department of Anesthesiology  Pre-operative Evaluation Record    Patient Name: Gary Walters                  Sex: male                  Age: 71 y.o.                  MRN: [id number]     Allergies:   Nsaids    No outpatient prescriptions have been marked as taking for the 11/12/16 encounter [facility] Encounter) with [facility] PAT OR.       Prior Surgeries:     Past Surgical History:   Procedure Laterality Date   ? CARDIAC CATHERIZATION     ? CERVICAL FUSION     ? CORONARY ANGIOPLASTY     ? CORONARY ARTERY BYPASS GRAFT         Social   ETOH:       Alcohol Use No     Tobacco:   History   Smoking Status   ? Never Smoker   Smokeless Tobacco   ? Never Used     Drugs:   History   Drug Use No       System Evaluation and Medical History:     Neuro/Psych: The patient suffers from headaches.   Cardiovascular: Cardiac history includes: PVD, hyperlipidemia and hypertension. The patient has CAD history. Cardiac risks include: CAD. Cardiac risk index score: 1.   GI/Hepatic: acid reflux. GERD controlled with medication. The GERD occurs daily.   Renal: The patient has a history of chronic renal disease. Renal diseases type: CRI. BPH   Endo/Other: diabetes. The patient has type 2 diabetes. The age of onset of the diabetes was > 40 years of age. The patient uses Oral hypoglycemics to control the diabetes. The patient has a history of osteoarthritis.     Osteomyelitis, cellulitis right foot, right foot ulcer      Physical Exam:        Anesthesia Plan:            Anesthesia Attending:

## 2016-11-15 NOTE — Anesthesia Preprocedure Evaluation
Department of Anesthesiology  Pre-operative Evaluation Record    Patient Name: Gary Walters                  Sex: male                  Age: 71 y.o.                  MRN: [id number]     Allergies:   Nsaids    No outpatient prescriptions have been marked as taking for the 11/12/16 encounter [facility] Encounter) with [facility] PAT OR.       Prior Surgeries:     Past Surgical History:   Procedure Laterality Date   ? CARDIAC CATHERIZATION     ? CERVICAL FUSION     ? CORONARY ANGIOPLASTY     ? CORONARY ARTERY BYPASS GRAFT         Social   ETOH:       Alcohol Use No     Tobacco:   History   Smoking Status   ? Never Smoker   Smokeless Tobacco   ? Never Used     Drugs:   History   Drug Use No       System Evaluation and Medical History:   Outstanding issue identified S/p cervical fusion     History of Anesthetic complication: History of Cervical Fusion and had manipulation that broke two areas and now has titanium plate in throat. And had experience with awaken during surgery.    Insulin Pump-cut off when awakens, eat prior to midnight Nothing to eat or drink after midnight but meds with small sip water/gatorade green.   HPI: 71 year old male with peripheral edema, Diabetic foot ulcer with osteomyelitis scheduled for Amputation Toe Metatarsophaleangeal joint, right 2nd toe amputation at level of PIPJ right 4/23.    History of Anesthetic complication: History of Cervical Fusion and had manipulation that broke two areas and now has titanium plate in throat. And had experience with awaken during surgery.    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

## 2016-11-15 NOTE — Anesthesia Preprocedure Evaluation
?  distal septal ?hypokinesis. Borderline LVH  2. The left atrium is mild to moderately dilated.  3. The aortic root is dilated measuring (4.0).  4. The pulmonary artery is mild/ moderately dilated (3.1)    07/2014 Cath  CONCLUSIONS:  1. Severe 3V CAD  2. Patent LIMA graft to the LAD  3. Patent SVG to OM1 with 90% stenosis at the anastomosis  4. Patent SVG to the PDA with 70% distal PDA stenosis      RECOMMENDATIONS:  1. PCI of the SVG to OM1 and the distal PDA  07/2014 PTCI  FINAL RESULTS:  1. Successful PCI of the SVG to OM1 with implantation of a Xience DES  (postdilated using a 3.14mm NC balloon)  2. Successful PCI of the distal PDA with implantation of a Resolute Integrity DES    ?     Anesthetic Complications: Pt has a titanium plate in his neck and cannot be bent back..   Neuro/Psych: The patient suffers from headaches.   Cardiovascular: Cardiac history includes: PVD, hyperlipidemia, hypertension and past MI. The hypertension is well controlled. The hypertension has occurred for > 10 years. The MI occurred on: < 1 month. Treatment included coronary artery bypass graft, antiplatelet therapy, beta blocker therapy and lipid-lowering statin medication prescribed. The patient has CAD history. Cardiac risks include: CAD. Cardiac risk index score: 1.   GI/Hepatic: acid reflux. GERD controlled with medication. The GERD occurs daily.   Renal: The patient has a history of chronic renal disease. Renal diseases type: CRI. BPH   Endo/Other: diabetes. The patient has type 2 diabetes. The age of onset of the diabetes was > 78 years of age. The patient uses Oral hypoglycemics to control the diabetes. The patient has a history of osteoarthritis. The patient has a history of infectious disease. Types include: MRSA.     Osteomyelitis, cellulitis right foot, right foot ulcer      Physical Exam:     GCS: Patients total GCS score is 15, with 4 for eye response, with 5 for verbal response, and with 6 for motor response.

## 2016-11-15 NOTE — Progress Notes
Pt arrived in cath holding from PACU approx 1430 in care of [facility].  However d/t inability to register pt to this area, unable to assign to pre-op until approx 1510 and care handed over to [facility].  Awaiting MD orders for pain med.

## 2016-11-15 NOTE — Anesthesia Preprocedure Evaluation
Airway: Physical exam of airway reveals, mouth opening of  <3 FB, with a TM distance of  > 6 cm, with limited neck ROM, Mallampati score III.       Cardiovascular: Negative for cardio exam except otherwise noted.   GCS: Patients total GCS score is 15, with 4 for eye response, with 5 for verbal response, and with 6 for motor response.I have performed a pre-anesthesia examination, evaluation, and prescribed the anesthesia plan.

## 2016-11-15 NOTE — Anesthesia Preprocedure Evaluation
Department of Anesthesiology  Pre-operative Evaluation Record    Patient Name: Gary Walters                  Sex: male                  Age: 71 y.o.                  MRN: [id number]     Allergies:   Nsaids    No outpatient prescriptions have been marked as taking for the 11/12/16 encounter [facility] Encounter) with [facility] PAT OR.       Prior Surgeries:     Past Surgical History:   Procedure Laterality Date   ? CARDIAC CATHERIZATION     ? CERVICAL FUSION     ? CORONARY ANGIOPLASTY     ? CORONARY ARTERY BYPASS GRAFT         Social   ETOH:       Alcohol Use No     Tobacco:   History   Smoking Status   ? Never Smoker   Smokeless Tobacco   ? Never Used     Drugs:   History   Drug Use No       System Evaluation and Medical History:     HPI: 71 year old male with peripheral edema, Diabetic foot ulcer with osteomyelitis scheduled for Amputation Toe Metatarsophaleangeal joint, right 2nd toe amputation at level of PIPJ right 4/23.    History of Anesthetic complication: History of Cervical Fusion and had manipulation that broke two areas and now has titanium plate in throat. And had experience with awaken during surgery.    History of Cervical spine limited mobility, Cervical Stenosis s/p Cervical Fusion, h/o Closed rib fx,  Acute Bronchitis (2012), URI, Cough (2012), Chronic Headaches, (Polyneuropathy) Peripheral Neuropathy, Hypertension, Hyperlipidemia, MI (2015), CAD (CABG X 3V CABG in October 2015 (LIMA graft to the LAD and SVGs to OM1 and the PDA), PCI of the SVG to OM1 and the distal PDA in November 2016, diastolic dysfunction), stents, DM-Post insulin pump placement, (Hemoglobin A1C 9), labile essential hypertension, dyslipidemia, GERD, CKD stage III, BPH, chronic pain syndrome, h/o Cutaneous Melanoma, Shingles,     EKG  Normal Sinus Rhythm, Septal Infarct, age undetermined    02/2015 ECHO  CONCLUSIONS  -----------  1. Overall left ventricular systolic function is normal with, an EF between 55 - 60 %. There is

## 2016-11-15 NOTE — Anesthesia Preprocedure Evaluation
Department of Anesthesiology  Pre-operative Evaluation Record    Patient Name: Gary Walters                  Sex: male                  Age: 71 y.o.                  MRN: [id number]     Allergies:   Nsaids    No outpatient prescriptions have been marked as taking for the 11/12/16 encounter [facility] Encounter) with [facility] PAT OR.       Prior Surgeries:     Past Surgical History:   Procedure Laterality Date   ? CARDIAC CATHERIZATION     ? CERVICAL FUSION     ? CORONARY ANGIOPLASTY     ? CORONARY ARTERY BYPASS GRAFT         Social   ETOH:       Alcohol Use No     Tobacco:   History   Smoking Status   ? Never Smoker   Smokeless Tobacco   ? Never Used     Drugs:   History   Drug Use No       System Evaluation and Medical History:   Outstanding issue identified S/p cervical fusion     History of Anesthetic complication: History of Cervical Fusion and had manipulation that broke two areas and now has titanium plate in throat. And had experience with awaken during surgery.    Insulin Pump-cut off when awakens, eat prior to midnight Nothing to eat or drink after midnight but meds with small sip water/gatorade green.    Dr. Jerene Bears, Cardiologist Acceptable moderat risk to proceed with right foot surgery-see Media for current cardiac diagnostic plan follow up. (Discussed with Audree Bane, MD and OK to proceed)    To continue with Plavix   HPI: 71 year old male with peripheral edema, Diabetic foot ulcer with osteomyelitis scheduled for Amputation Toe Metatarsophaleangeal joint, right 2nd toe amputation at level of PIPJ right 4/23.    History of Anesthetic complication: History of Cervical Fusion and had manipulation that broke two areas and now has titanium plate in throat. And had experience with awaken during surgery.    History of Cervical spine limited mobility, Cervical Stenosis s/p Cervical Fusion, s/p Titanium plate-anterior, h/o Closed rib fx,  Acute Bronchitis

## 2016-11-15 NOTE — Anesthesia Preprocedure Evaluation
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  No Family member died from MI prior to age 71/24/18 Cardiology Dr. Jonny Ruiz  Problems:  Diabetes, Dyslipidemia, Coronary arteriosclerosis, Cardiovascular stress test abn    02/2015 ECHO  CONCLUSIONS  -----------  1. Overall left ventricular systolic function is normal with, an EF between 55 - 60 %. There is  ?distal septal ?hypokinesis. Borderline LVH  2. The left atrium is mild to moderately dilated.  3. The aortic root is dilated measuring (4.0).  4. The pulmonary artery is mild/ moderately dilated (3.1)    07/2014 Cath  CONCLUSIONS:  1. Severe 3V CAD  2. Patent LIMA graft to the LAD  3. Patent SVG to OM1 with 90% stenosis at the anastomosis  4. Patent SVG to the PDA with 70% distal PDA stenosis  RECOMMENDATIONS:  1. PCI of the SVG to OM1 and the distal PDA  07/2014 PTCI  FINAL RESULTS:  1. Successful PCI of the SVG to OM1 with implantation of a Xience DES  (postdilated using a 3.32mm NC balloon)  2. Successful PCI of the distal PDA with implantation of a Resolute Integrity DES    ?     Anesthetic Complications: Pt has a titanium plate in his neck and cannot be bent back..   Neuro/Psych: The patient suffers from headaches.   Cardiovascular: Cardiac history includes: PVD, hyperlipidemia, hypertension and past MI. The hypertension is well controlled. The hypertension has occurred for > 10 years. The MI occurred on: < 1 month.

## 2016-11-15 NOTE — Anesthesia Preprocedure Evaluation
Renal: The patient has a history of chronic renal disease. Renal diseases type: CRI. BPH   Endo/Other: diabetes. The patient has type 2 diabetes. The age of onset of the diabetes was > 70 years of age. The patient uses Oral hypoglycemics to control the diabetes. The patient has a history of osteoarthritis. The patient has a history of infectious disease. Types include: MRSA.     Osteomyelitis, cellulitis right foot, right foot ulcer      Physical Exam:   Airway: Physical exam of airway reveals, mouth opening of >3 FB with limited neck ROM, Mallampati score of III.   GCS: Patients total GCS score is 15, with 4 for eye response, with 5 for verbal response, and with 6 for motor response.  Obesity: obesity.    Dental:         Other Findings: PERL, no jaundice; ENT-non tender, no erythema no nodes; Neck-supple, no JVD, no nodes, palpable plate, Cardiac RR, no tachycardia, no bradycardia; Respiratory BBS clear, no wheezing, occasional rales; Abd BS+ soft, non-tender, pump right abd, Ext Pedal pulse, no edema, good grip; Neuro alert, oriented      Anesthesia Plan:        Plan for anesthesia technique is general.  The vascular access plan include the use of peripheral. The following pulse oximetry, body tempature, ETC02, NIBP and 6-lead EKG will be used for intraoperative monitoring.       Anesthesia Attending:

## 2016-11-15 NOTE — Immediate Post-Operative Note
[facility]  Immediate Post-Operative Note      Case Record #: [id number]  Gary Walters  August 25, 1945, 71 y.o., male    Case Date: 11/15/2016    Staff:  Surgeon(s) and Role:     * Piraino, Windy Carina, DPM - Primary     * Feliberto Gottron, [place] - Resident - Assisting  Anesthesiologist: Cyndie Mull, MD  CRNA: Luan Pulling, Janie Morning, CRNA      Pre-Op Diagnosis:   Peripheral edema [R60.9]  Diabetic foot ulcer with osteomyelitis [E11.621, E11.69, L97.509, M86.9]  Right foot pain [M79.671]  Cellulitis of toe of right foot [L03.031]  Diabetes mellitus with peripheral vascular disease [E11.51]  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, with fat layer exposed, unspecified laterality [E11.621, L97.502]    Procedure(s):  28820 - AMPUTATION TOE METATARSOPHALANGEAL JOINT, right 2nd toe amputation at level of PIPJ (Right)    Post-Op Diagnosis Codes:     * Peripheral edema [R60.9]     * Diabetic foot ulcer with osteomyelitis [E11.621, E11.69, L97.509, M86.9]     * Right foot pain [M79.671]     * Cellulitis of toe of right foot [L03.031]     * Diabetes mellitus with peripheral vascular disease [E11.51]     * 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 A Resident/ACGME fellow participated in this case: Yes   2. Participating Resident/ACGME fellow was qualified for case type: Yes    3. Teaching surgeon is present entire case (including opening & closing): Yes          Findings/Description: Chronic nonhealing ulceration of right second distal toe with interval partial amputation distal to the level of the PIPJ and intraoperative Spy Microangiography.    Specimens:     SPECIMENS     ID Source Type Lat. Tests [id number] Indication F/Z? Collect By [employer] Date/Time Destination Time in Formalin ClinicalHx Nurse    A Toe(s), amputation, non-trauma In Formalin Right ? SURG PATH SPECIMEN

## 2016-11-15 NOTE — Anesthesia Preprocedure Evaluation
4. The pulmonary artery is mild/ moderately dilated (3.1)    07/2014 Cath  CONCLUSIONS:  1. Severe 3V CAD  2. Patent LIMA graft to the LAD  3. Patent SVG to OM1 with 90% stenosis at the anastomosis  4. Patent SVG to the PDA with 70% distal PDA stenosis      RECOMMENDATIONS:  1. PCI of the SVG to OM1 and the distal PDA  07/2014 PTCI  FINAL RESULTS:  1. Successful PCI of the SVG to OM1 with implantation of a Xience DES  (postdilated using a 3.63mm NC balloon)  2. Successful PCI of the distal PDA with implantation of a Resolute Integrity DES    ?     Anesthetic Complications: Pt has a titanium plate in his neck and cannot be bent back..   Neuro/Psych: The patient suffers from headaches.   Cardiovascular: Cardiac history includes: PVD, hyperlipidemia, hypertension and past MI. The hypertension is well controlled. The hypertension has occurred for > 10 years. The MI occurred on: < 1 month. Treatment included coronary artery bypass graft, antiplatelet therapy, beta blocker therapy and lipid-lowering statin medication prescribed. The patient has CAD history. Cardiac risks include: CAD. Cardiac risk index score: 1.   GI/Hepatic: acid reflux. GERD controlled with medication. The GERD occurs daily.   Renal: The patient has a history of chronic renal disease. Renal diseases type: CRI. BPH   Endo/Other: diabetes. The patient has type 2 diabetes. The age of onset of the diabetes was > 30 years of age. The patient uses Oral hypoglycemics to control the diabetes. The patient has a history of osteoarthritis. The patient has a history of infectious disease. Types include: MRSA.     Osteomyelitis, cellulitis right foot, right foot ulcer      Physical Exam:     Obesity: obesity.        Anesthesia Plan:            Anesthesia Attending:

## 2016-11-16 DIAGNOSIS — I739 Peripheral vascular disease, unspecified: Principal | ICD-10-CM

## 2016-11-16 DIAGNOSIS — IMO0001 Amputation of second toe, left, traumatic, complicated, initial encounter: Secondary | ICD-10-CM

## 2016-11-16 MED ORDER — ASPIRIN 81 MG PO CHEW
81 mg | Freq: Every day | ORAL | 3 refills | Status: CP
Start: 2016-11-16 — End: 2017-10-20

## 2016-11-16 MED ORDER — MELATONIN 3 MG PO TABS
3 mg | Freq: Every evening | ORAL | Status: DC
Start: 2016-11-16 — End: 2016-11-16

## 2016-11-16 NOTE — Progress Notes
Patient has dc orders, removed INT, RX given, verbalized dc instructions, no questions voiced, pt transported off floor via wheelchair.

## 2016-11-16 NOTE — Procedures
hand injection.  Then we did bilateral lower extremities angiography via bolus chase.   ??  MODERATE SEDATION: The procedure was performed under IV moderate sedation using escalating doses of?IV Fentanyl and versed administered starting at 1715 and procedure stopped at 1815?under the supervision of the faculty. The patient was monitored throughout the entire procedure.  ??  ACCESS MANAGEMENT:? Left femoral artery perclose?was deployed.  ?  HEMOSTASIS: Suscessful using Perclose device.   ??  CONCLUSIONS:  1. Patent inflow and outflow bilaterally  2. Patent 2/3 run offs bilaterally ( Posterior Tibialis and Peroneal). AT is occluded bilaterally  3. Hemostasis achived with Perclose device.   ?  ??  RECOMMENDATIONS:  1.?DAPT with ASA/Plavix.   2. Left ?groin precautions  3. F/U with Dr. Myrtie Hawk in 2-4 weeks.  4. Continue medical management for PAD  5. Admission overnight for monitoring        [name], MD  Cardiology Fellow-[id number]  11/15/2016  6:28 PM

## 2016-11-16 NOTE — Plan of Care
Problem: Pain, Acute & Chronic  Goal: Pain is relieved/acceptable level with minimal side effects  Outcome: Ongoing  Patient denies any c/o pain at this time, will continue to assess pain level throughout shift.

## 2016-11-16 NOTE — Procedures
Interventional Cardiology Procedure Note  ?  DATE OF PROCEDURE: 11/15/16    INTERVENTIONAL MD: Venancio Poisson, M.D.  INTERVENTIONAL FELLOW: Raleigh Nation, M.D. / Sharen Counter, MD  ?  INDICATION:? Right Lower extremity Critical Limb ischemia     ??  HPI:  71 yo male with a history of CAD, MI, 3V CABG in October 2015 (LIMA graft to the LAD and SVGs to OM1 and the PDA), PCI of the SVG to OM1 and the distal PDA in November 3685, diastolic dysfunction, DM, labile essential hypertension, dyslipidemia, GERD, CKD, BPH, and chronic pain syndrome. Patient with CLI to the right 2nd toe and presented today for 2nd toe amputation with Podiatry and had Lower Leg angiography with Dr. Christianne Dolin.  ??  PROCEDURE:  1. Bilateral Ilio-femoral angiography  2. SubSelective RLE and LLE angiogram  ?  ACCESS:  L. Femoral Approach.   ?  DIAGNOSTIC FINDINGS:  RIGHT:  Common Iliac- Patent   Internal Iliac-  Patent  External Iliac- Patent  Common Femoral- Patent  Profunda Femoral- Patent  Superficial Femoral- Patent   Popliteal- Patent   Tibioperoneal Trunk- Patent  Anterior Tibialis- Occluded proximally but reconstitute distally for a short segment  Posterior Tibialis- Patent and gives plantar arche via dense collateral in the foot  Peroneal- Patent with mild disease  DP- Occluded  ??  LEFT:  Common Iliac- Patent   Internal Iliac- Patent  External Iliac- Patent   Common Femoral- Patent    Profunda Femoral- Patent  Superficial Femoral- Patent   Popliteal-  Patent  Tibioperoneal Trunk- Patent   Anterior Tibialis- Occluded at it's proximal segment.   Posterior Tibialis- Patent with severe disease up to 80% mid segment  Peroneal- Patent with mild disease  DP- Occluded  ??  DIAGNOSTIC DESCRIPTION:  Access was obtained in the left?CFA via a 4 french Micro introducer kit. A 5 french sheath was placed. A 4Fr UF catheter was placed in the descending abdominal aorta and a bilateral ilio-femoral angiogram was performed using

## 2016-11-16 NOTE — Plan of Care
Problem: Altered Cardiac Function  Goal: BP, HR, heart rhythm WNL for patient  Outcome: Ongoing  Assumed care of pt at beginning of shift, monitors and alarms checked and set.

## 2016-11-16 NOTE — Procedures
hand injection.  Then we did bilateral lower extremities angiography via bolus chase.   ??  MODERATE SEDATION: The procedure was performed under IV moderate sedation using escalating doses of?IV Fentanyl and versed administered starting at 1724 and procedure stopped at 1800?under the supervision of the faculty. The patient was monitored throughout the entire procedure.  ??  ACCESS MANAGEMENT:? Left femoral artery perclose?was deployed.  ?  HEMOSTASIS: Suscessful using Perclose device.   ??  CONCLUSIONS:  1. Patent inflow and outflow bilaterally  2. Patent 2/3 run offs bilaterally ( Posterior Tibialis and Peroneal). AT is occluded bilaterally  3. Hemostasis achived with Perclose device.   ?  ??  RECOMMENDATIONS:  1.?DAPT with ASA/Plavix.   2. Left ?groin precautions  3. F/U with Dr. Myrtie Hawk in 2-4 weeks.  4. Continue medical management for PAD  5. Admission overnight for monitoring        [name], MD  Cardiology Fellow-PGY6  11/15/2016  6:28 PM

## 2016-11-16 NOTE — Progress Notes
Pressure Dressing changed. Will continue to monitor

## 2016-11-16 NOTE — Procedures
hand injection.  Then we did bilateral lower extremities angiography via bolus chase.   ??  MODERATE SEDATION: The procedure was performed under IV moderate sedation using escalating doses of?IV Fentanyl and versed administered starting at 1724 and procedure stopped at 1800?under the supervision of the faculty. The patient was monitored throughout the entire procedure.  ??  ACCESS MANAGEMENT:? Left femoral artery perclose?was deployed.  ?  HEMOSTASIS: Suscessful using Perclose device.   ??  CONCLUSIONS:  1. Patent inflow and outflow bilaterally  2. Patent 2/3 run offs bilaterally ( Posterior Tibialis and Peroneal). AT is occluded bilaterally  3. Hemostasis achived with Perclose device.   ?  ??  RECOMMENDATIONS:  1.?DAPT with ASA/Plavix.   2. Left ?groin precautions  3. F/U with Dr. Myrtie Hawk in 2-4 weeks.  4. Continue medical management for PAD  5. Admission overnight for monitoring  6. Distal right PT intervention via antegrade approach if clinically indicated        [name], MD  Cardiology Fellow-PGY6  11/15/2016  6:28 PM

## 2016-11-16 NOTE — Op Note
addressing all concerns.  The patient was brought into the Operating Room and placed on the table in supine position.  Timeout was taken x2 to identify the patient by name, medical record number and date of birth according to AAOS guidelines.  Following induction of general anesthesia and preoperative block consisting of 10mL of 0.5% Marcaine plain, the foot was scrubbed, prepped and draped in the usual aseptic manner.  Attention was directed to the right second distal digit.  A full thickness incision was carried about the distal aspect of the right second digit.  The toe was disarticulated from the level of the DIPJ.  Once this had been performed, it was deemed necessary to further resect the middle phalanx in order to provide adequate soft tissue closure.  Bone cutters were used to perform an osteotomy at the level of the midshaft of the middle phalanx.  The site was copiously irrigated.  The wound was then closed with 3-0 nylon suture.  The foot was dressed with Adaptic, 4 x 4's, cast padding, Kerlix and Ace.  The patient tolerated the procedure and anesthesia well.  The patient transferred to PACU with vital signs stable and rest status intact to the right foot.    DISPOSITION:  The patient will be discharged home under the care of his wife.  The patient is to be heel weightbearing in a surgical shoe at all times of the right lower extremity.  The patient will be provided with a prescription for Norco 5/325, #30, as well as Keflex  b.i.d. for a period of 5 days.  The patient will follow up with Dr. Arnetha Gula on Nov 29, 2016.  The patient is to maintain clean, dry and intact dressings at this point in time.      Linda Hedges, DPM    Dictated by: Feliberto Gottron, DPM    PD/NTS  DD:  11/15/2016 13:59:11  DT:  11/15/2016 16:33:36  Job#:  [id number]  Conf#:  [id number]

## 2016-11-16 NOTE — Plan of Care
Please keep surgical dressing clean, dry and intact to right foot until Gary Walters's first post operative visit with Dr. Arnetha Gula scheduled for 11/29/16 at 1130.    Eual Fines, DPM  [phone]

## 2016-11-16 NOTE — Plan of Care
Please keep surgical dressing clean, dry and intact to right foot until Mr. Patteson's first post operative visit with Dr. Arnetha Gula scheduled for 11/29/16 at 1130.    Eual Fines, DPM  [phone]

## 2016-11-16 NOTE — Plan of Care
Problem: Altered Cardiac Function  Goal: BP, HR, heart rhythm WNL for patient    Intervention: Monitor ECG rhythm  Left groin dressing with minimal oozing, oozing marked, pulses palpable. Pt educated to remain on bedrest with legs straight and head down.Pt vitals stable with no complaints of chest pain or shortness of breath; pt resting in bed; will continue to monitor.

## 2016-11-16 NOTE — Op Note
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    PROCEDURE:  Amputation toe, right second toe at the level of the PIP.  CPT is [phone].    PATHOLOGY:  Right second toe amputation.    SPECIMENS:  None.    ANESTHESIA:  General with local block consisting of 10mL of 0.5% Marcaine plain.    HEMOSTASIS:  Anatomic dissection.    ESTIMATED BLOOD LOSS:  1mL.    MATERIALS:  3-0 nylon.    INDICATIONS FOR PROCEDURE:  [name] is a 71 year old uncontrolled diabetic male with significant peripheral vascular disease who presented to [facility] with chronic ulceration of the right second distal digit.  Attempt at local wound care and antibiotic therapy was exhausted.  Further discussion was had with the patient with regards to chronic infection of the right second toe and surgical intervention in the form of distal amputation.  The patient elected to proceed with amputation.  After discussing all risks and benefits of surgery, the patient was padded and marked for previously mentioned procedure.    INTRAOPERATIVE FINDINGS:  Soft, nonviable bone as well as chronic ulceration of the right second distal digit.    PROCEDURE IN DETAIL:  The patient was identified in the preoperative holding area by name, date of birth, medical record number, and was consented for the above named procedure after answering all questions and

## 2016-11-16 NOTE — Plan of Care
Please keep surgical dressing clean, dry and intact to right foot until Mr. Spaugh's first post operative visit with Dr. Arnetha Gula scheduled for 11/29/16 at 1130.    Eual Fines, DPM  [phone]

## 2016-11-16 NOTE — Op Note
addressing all concerns.  The patient was brought into the Operating Room and placed on the table in supine position.  Timeout was taken x2 to identify the patient by name, medical record number and date of birth according to AAOS guidelines.  Following induction of general anesthesia and preoperative block consisting of 10mL of 0.5% Marcaine plain, the foot was scrubbed, prepped and draped in the usual aseptic manner.  Attention was directed to the right second distal digit.  A full thickness incision was carried about the distal aspect of the right second digit.  The toe was disarticulated from the level of the DIPJ.  Once this had been performed, it was deemed necessary to further resect the middle phalanx in order to provide adequate soft tissue closure.  Bone cutters were used to perform an osteotomy at the level of the midshaft of the middle phalanx.  The site was copiously irrigated.  The wound was then closed with 3-0 nylon suture.  The foot was dressed with Adaptic, 4 x 4's, cast padding, Kerlix and Ace.  The patient tolerated the procedure and anesthesia well.  The patient transferred to PACU with vital signs stable and rest status intact to the right foot.    DISPOSITION:  The patient will be discharged home under the care of his wife.  The patient is to be heel weightbearing in a surgical shoe at all times of the right lower extremity.  The patient will be provided with a prescription for Norco 5/325, #30, as well as Keflex  b.i.d. for a period of 5 days.  The patient will follow up with Dr. Arnetha Gula on Nov 29, 2016.  The patient is to maintain clean, dry and intact dressings at this point in time.      Linda Hedges, DPM    Dictated by: Feliberto Gottron, DPM    PD/NTS  DD:  11/15/2016 13:59:11  DT:  11/15/2016 16:33:36  Job#:  [id number]  Conf#:  [id number]

## 2016-11-16 NOTE — Op Note
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    PROCEDURE:  Amputation toe, right second toe at the level of the PIP.  CPT is [phone].    PATHOLOGY:  Right second toe amputation.    SPECIMENS:  None.    ANESTHESIA:  General with local block consisting of 10mL of 0.5% Marcaine plain.    HEMOSTASIS:  Anatomic dissection.    ESTIMATED BLOOD LOSS:  1mL.    MATERIALS:  3-0 nylon.    INDICATIONS FOR PROCEDURE:  Mr. [name] is a 71 year old uncontrolled diabetic male with significant peripheral vascular disease who presented to [facility] with chronic ulceration of the right second distal digit.  Attempt at local wound care and antibiotic therapy was exhausted.  Further discussion was had with the patient with regards to chronic infection of the right second toe and surgical intervention in the form of distal amputation.  The patient elected to proceed with amputation.  After discussing all risks and benefits of surgery, the patient was padded and marked for previously mentioned procedure.    INTRAOPERATIVE FINDINGS:  Soft, nonviable bone as well as chronic ulceration of the right second distal digit.    PROCEDURE IN DETAIL:  The patient was identified in the preoperative holding area by name, date of birth, medical record number, and was consented for the above named procedure after answering all questions and

## 2016-11-16 NOTE — Op Note
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    PROCEDURE:  Amputation toe, right second toe at the level of the PIP.  CPT is [phone].    PATHOLOGY:  Right second toe amputation.    SPECIMENS:  None.    ANESTHESIA:  General with local block consisting of 10mL of 0.5% Marcaine plain.    HEMOSTASIS:  Anatomic dissection.    ESTIMATED BLOOD LOSS:  1mL.    MATERIALS:  3-0 nylon.    INDICATIONS FOR PROCEDURE:  Mr. [name] is a 71 year old uncontrolled diabetic male with significant peripheral vascular disease who presented to [facility] with chronic ulceration of the right second distal digit.  Attempt at local wound care and antibiotic therapy was exhausted.  Further discussion was had with the patient with regards to chronic infection of the right second toe and surgical intervention in the form of distal amputation.  The patient elected to proceed with amputation.  After discussing all risks and benefits of surgery, the patient was padded and marked for previously mentioned procedure.    INTRAOPERATIVE FINDINGS:  Soft, nonviable bone as well as chronic ulceration of the right second distal digit.    PROCEDURE IN DETAIL:  The patient was identified in the preoperative holding area by name, date of birth, medical record number, and was consented for the above named procedure after answering all questions and

## 2016-11-17 NOTE — Progress Notes
TRANSITION OF CARE NON FACE TO FACE CALL      Date of admission: 11/15/2016  Reason for admission:PAD (peripheral artery disease  Scheduled appointment at [facility]: Dr. Gary Fleet 12/02/2016  Date of discharge: 11/16/2016  Discharging physician:  Discharge summary date received:  Copies of discharge instructions received:  Diagnostic tests results: test name:   imaging                Date received:  Patient current location: Home    Initial communication post discharge:  1st attempt: Date:  11/16/2016 Time: 1541               Method: phone [phone]                           Initial: Mailbox full  2nd attempt  Date:               Time:             Method:                          Initial:    Patient education:    Community resources:  Medication reconciliation

## 2016-11-17 NOTE — Progress Notes
TRANSITION OF CARE NON FACE TO FACE CALL      Date of admission: 11/15/2016  Reason for admission:PAD (peripheral artery disease  Scheduled appointment at [facility]: Dr. Gary Fleet 12/02/2016  Date of discharge: 11/16/2016  Discharging physician:  Discharge summary date received:  Copies of discharge instructions received:  Diagnostic tests results: test name:   imaging                Date received:  Patient current location: Home    Initial communication post discharge:  1st attempt: Date:  11/16/2016 Time: 1541               Method: phone [phone]                           Initial: Mailbox full  2nd attempt  Date: 11/17/2016  Time: 1135            Method:      Phone  Initial: Spoke to patient   Patient feeling weak.   Advised to make sure he is eating properly to control sugar levels.   Patient making an earlier appt wih provider.

## 2016-11-22 ENCOUNTER — Ambulatory Visit: Attending: Family Medicine | Primary: Family Medicine

## 2016-11-22 DIAGNOSIS — N189 Chronic kidney disease, unspecified: Secondary | ICD-10-CM

## 2016-11-22 DIAGNOSIS — I25810 Atherosclerosis of coronary artery bypass graft(s) without angina pectoris: Principal | ICD-10-CM

## 2016-11-22 DIAGNOSIS — E119 Type 2 diabetes mellitus without complications: Principal | ICD-10-CM

## 2016-11-22 DIAGNOSIS — G609 Hereditary and idiopathic neuropathy, unspecified: Secondary | ICD-10-CM

## 2016-11-22 DIAGNOSIS — Z981 Arthrodesis status: Secondary | ICD-10-CM

## 2016-11-22 DIAGNOSIS — E1169 Type 2 diabetes mellitus with other specified complication: Secondary | ICD-10-CM

## 2016-11-22 DIAGNOSIS — M4802 Spinal stenosis, cervical region: Secondary | ICD-10-CM

## 2016-11-22 DIAGNOSIS — E11621 Type 2 diabetes mellitus with foot ulcer: Secondary | ICD-10-CM

## 2016-11-22 DIAGNOSIS — I10 Essential (primary) hypertension: Secondary | ICD-10-CM

## 2016-11-22 DIAGNOSIS — M869 Osteomyelitis, unspecified: Secondary | ICD-10-CM

## 2016-11-22 DIAGNOSIS — M542 Cervicalgia: Secondary | ICD-10-CM

## 2016-11-22 DIAGNOSIS — N183 Chronic kidney disease, stage 3 (moderate): Secondary | ICD-10-CM

## 2016-11-22 DIAGNOSIS — Z9889 Other specified postprocedural states: Secondary | ICD-10-CM

## 2016-11-22 DIAGNOSIS — L97509 Non-pressure chronic ulcer of other part of unspecified foot with unspecified severity: Secondary | ICD-10-CM

## 2016-11-22 DIAGNOSIS — C801 Malignant (primary) neoplasm, unspecified: Secondary | ICD-10-CM

## 2016-11-22 DIAGNOSIS — Z9641 Presence of insulin pump (external) (internal): Secondary | ICD-10-CM

## 2016-11-22 DIAGNOSIS — L03031 Cellulitis of right toe: Secondary | ICD-10-CM

## 2016-11-22 DIAGNOSIS — Z85820 Personal history of malignant melanoma of skin: Secondary | ICD-10-CM

## 2016-11-22 DIAGNOSIS — I2510 Atherosclerotic heart disease of native coronary artery without angina pectoris: Secondary | ICD-10-CM

## 2016-11-22 MED ORDER — CEPHALEXIN 500 MG PO CAPS
500 mg | Freq: Two times a day (BID) | ORAL | 0 refills | Status: CP
Start: 2016-11-22 — End: 2016-11-29

## 2016-11-22 MED ORDER — GABAPENTIN 600 MG PO TABS
600 mg | Freq: Three times a day (TID) | ORAL | 3 refills | Status: CP
Start: 2016-11-22 — End: 2017-02-09

## 2016-11-22 MED ORDER — HYDROCODONE-ACETAMINOPHEN 5-325 MG PO TABS
1 | ORAL_TABLET | Freq: Four times a day (QID) | ORAL | 0 refills | Status: CP | PRN
Start: 2016-11-22 — End: 2016-11-22

## 2016-11-22 MED ORDER — HYDROCODONE-ACETAMINOPHEN 5-325 MG PO TABS
1 | ORAL_TABLET | Freq: Three times a day (TID) | ORAL | 0 refills | Status: CP | PRN
Start: 2016-11-22 — End: 2017-01-19

## 2016-11-22 NOTE — Progress Notes
Constitutional: He appears well-developed and well-nourished. No distress.   HENT:   Head: Normocephalic.   Eyes: Pupils are equal, round, and reactive to light.   Cardiovascular: Normal rate, regular rhythm and normal heart sounds.  Exam reveals no gallop and no friction rub.    No murmur heard.  Pulmonary/Chest: Effort normal and breath sounds normal. No respiratory distress. He has no wheezes. He has no rales.   Abdominal: Soft. There is no tenderness.   Skin:   L groin ecchymoses no swelling        Assessment:       ICD-10-CM ICD-9-CM    1. Coronary artery disease involving coronary bypass graft of native heart without angina pectoris I25.810 414.05    2. Cervical spinal stenosis M48.02 723.0    3. Stage 3 chronic kidney disease N18.3 585.3    4. Type 2 diabetes mellitus without complication, without long-term current use of insulin E11.9 250.00    5. Diabetic foot ulcer with osteomyelitis E11.621 250.80     E11.69 707.15     L97.509 730.27     M86.9 731.8    6. History of cutaneous melanoma Z85.820 V10.82    7. Status post insulin pump placement Z96.41 V45.85    8. Status post cervical spinal fusion Z98.1 V45.4           Plan:   Patient advised to various Tx options and agreed to the following:  Continuation of current medications listed above,the importance of follow up visits.    Risk verses benefits of medication and treatments were discussed.  Patient voiced understanding.      Rev notes   rev labs   rev seg doppler  Rev angiogram le  Rev vasc surg  Notes   rf meds   pt to fu with vasc surgery and ortho/podiatry  Fu endo as sch  Fu here 2 months    No orders of the following type(s) were placed in this encounter: Medications.     No orders of the following type(s) were placed in this encounter: Procedures      Health Maintenance was reviewed. The patient's HM Topic list was:                                            Health Maintenance   Topic Date Due

## 2016-11-22 NOTE — Progress Notes
Subjective:   Gary Walters is a 71 y.o. male being seen today for No chief complaint on file.       HPI  Patient is here for follow up of chronic medical conditions and is currently using the medications listed below.  The current medical conditions are mostly stable  H/o cad dm neuropathy htn  All cond unchanged except of the toe amputation this month due to infection  Prior to that was treated with iv abx at [facility]  States sugar is ok on insulin pumop  Le angio was performed with some pad and antiplatelets continued    Needs rf meds   no cp sob abpain ha  Past Medical History:   Diagnosis Date   ? CAD (coronary artery disease)    ? Cancer    ? CKD (chronic kidney disease) 08/20/2014   ? Diabetes mellitus    ? Hypertension      Past Surgical History:   Procedure Laterality Date   ? CARDIAC CATHERIZATION     ? CERVICAL FUSION     ? CORONARY ANGIOPLASTY     ? CORONARY ARTERY BYPASS GRAFT      3 vessel     Family History   Problem Relation Age of Onset   ? Cancer Mother    ? Diabetes Father    ? Blindness Father    ? Cancer Sister    ? Cancer Brother      Social History     Social History   ? Marital status: Married     Spouse name: N/A   ? Number of children: N/A   ? Years of education: N/A     Occupational History   ? Not on file.     Social History Main Topics   ? Smoking status: Never Smoker   ? Smokeless tobacco: Never Used   ? Alcohol use No   ? Drug use: No   ? Sexual activity: Yes     Partners: Female     Other Topics Concern   ? Not on file     Social History Narrative     Current Outpatient Prescriptions on File Prior to Visit   Medication Sig   ? amLODIPine (NORVASC) 10 MG PO Tablet Take 5 mg (1/2 tablet) by mouth 2 times daily.   ? aspirin 81 MG PO Tablet Chewable Chew 1 tablet daily.   ? atorvastatin (LIPITOR) 40 MG Tablet TAKE ONE TABLET BY MOUTH ONCE DAILY   ? benzonatate (TESSALON) 200 MG PO Capsule TAKE ONE CAPSULE BY MOUTH THREE TIMES DAILY AS NEEDED   ? cephALEXin (KEFLEX) 500 MG PO Capsule

## 2016-11-22 NOTE — Progress Notes
?   USPSTF Hepatitis C Screening  06/08/2017 (Originally June 08, 1946)   ? Hemoglobin A1C  01/05/2017   ? Diabetic Eye Exam  02/10/2017   ? Preventive Wellness Visit  07/05/2017   ? Lipid Profile  10/05/2017   ? Urine Microalbumin  10/05/2017   ? Diabetic Foot Exam  10/26/2017   ? Creatinine  11/16/2017   ? Basic Metabolic Panel  11/16/2017   ? Colon Cancer Screening  11/08/2022   ? DTaP,Tdap,and Td Vaccines (2 - Td) 01/17/2024   ? Influenza Vaccine  Addressed   ? Pneumovax / Prevnar  Completed   ? Zoster Vaccine  Excluded           HM    PSA1/16  PROSTATE  COLONOSC    ADV DIRY    FLU10/16;11/17d  PRENV 6/16  PNEUMOV 8/81F  ZOSTAV 6/16r    CBC5/18  CMP  LIPIDS  TSH  B12 1/16  VIT D  URINE DIP    HBA1C 5/16;9/17;3/18  FEET 5/16 podiatry fu  OPHTH10/16r;7/17  URINE MICROALB endo;3/18     EKG 4/16  ECHO 2016  STRESS TEST p    SEG DOPPLER 8/16    CARDIO FU  ENDOCRINOL FU DR CHEHADE -  PAIN [facility]NG FU  NUTRITIONIST 8/16R    PAIN CONTRACT  URINE DOA

## 2016-11-22 NOTE — Progress Notes
?   cephALEXin (KEFLEX) 500 MG PO Capsule Take 1 capsule by mouth 2 times daily.   ? cetirizine (ZyrTEC) 10 MG Tablet Take 5 mg (1/2 tablet) by mouth daily.   ? cloNIDine (CATAPRES) 0.1 MG PO Tablet Take 1 tablet by mouth 2 times daily as needed for severe hypertension.   ? clopidogrel (PLAVIX) 75 MG PO Tablet TAKE ONE TABLET BY MOUTH ONCE DAILY   ? diclofenac (VOLTAREN GEL) 1 % TD Gel Apply 2 g topically 4 times daily. **Measure dose using enclosed dose card**   ? HYDROcodone-acetaminophen (NORCO) 5-325 MG PO Tablet Take 1 tablet by mouth every 6 hours as needed for pain. Earliest Fill Date: 11/05/16   ? HYDROcodone-acetaminophen (NORCO) 5-325 MG PO Tablet Take 1 tablet by mouth every 4 to 6 hours as needed for pain.   ? insulin aspart (NovoLOG) vial 100 units daily via insulin pump   ? lidocaine (XYLOCAINE) 2 % EX Gel Apply topically as needed   ? lidocaine-prilocaine (EMLA) 2.5-2.5 % EX Cream Apply to feet once a day   ? LORazepam (ATIVAN) 1 MG PO Tablet 1 pill hs pr n insomnia   ? losartan-hydroCHLOROthiazide (HYZAAR) 100-25 MG PO Tablet Take 50/12.5 mg (1/2 tablet) by mouth 2 times daily.   ? metFORMIN (GLUCOPHAGE) 1000 MG PO Tablet Take 1 tablet by mouth 2 times daily (with meals). SrCr: 1.60* mg/dl (16/10 9604) [last value]   ? metoprolol succinate (TOPROL-XL) 25 MG PO Tablet Extended Release 24 Hour Take 1 tablet by mouth 2 times daily.   ? omeprazole (PriLOSEC) 40 MG Capsule Delayed Release Take 1 capsule by mouth daily.   ? sildenafil (VIAGRA) 100 MG PO Tablet Take 1 tablet by mouth as needed for erectile dysfunction.   ? tamsulosin (FLOMAX) 0.4 MG PO Capsule Take 1 capsule by mouth daily.     No current facility-administered medications on file prior to visit.      Allergies   Allergen Reactions   ? Nsaids Other (See Comments)     ?Azotemia         Review of Systems  Review of Systems   All other systems reviewed and are negative.          Objective:       Physical Exam

## 2016-11-23 ENCOUNTER — Inpatient Hospital Stay: Admit: 2016-11-23 | Discharge: 2016-11-24 | Primary: Family Medicine

## 2016-11-23 DIAGNOSIS — Z5309 Procedure and treatment not carried out because of other contraindication: Principal | ICD-10-CM

## 2016-11-23 DIAGNOSIS — I739 Peripheral vascular disease, unspecified: Secondary | ICD-10-CM

## 2016-11-23 DIAGNOSIS — E1151 Type 2 diabetes mellitus with diabetic peripheral angiopathy without gangrene: Secondary | ICD-10-CM

## 2016-11-29 ENCOUNTER — Ambulatory Visit: Attending: "Endocrinology | Primary: Family Medicine

## 2016-11-29 ENCOUNTER — Ambulatory Visit: Admit: 2016-11-29 | Discharge: 2016-11-29 | Attending: Podiatrist | Primary: Family Medicine

## 2016-11-29 DIAGNOSIS — I2510 Atherosclerotic heart disease of native coronary artery without angina pectoris: Secondary | ICD-10-CM

## 2016-11-29 DIAGNOSIS — E7849 Other hyperlipidemia: Secondary | ICD-10-CM

## 2016-11-29 DIAGNOSIS — E119 Type 2 diabetes mellitus without complications: Principal | ICD-10-CM

## 2016-11-29 DIAGNOSIS — I10 Essential (primary) hypertension: Secondary | ICD-10-CM

## 2016-11-29 DIAGNOSIS — R609 Edema, unspecified: Principal | ICD-10-CM

## 2016-11-29 DIAGNOSIS — L03031 Cellulitis of right toe: Secondary | ICD-10-CM

## 2016-11-29 DIAGNOSIS — Z4789 Encounter for other orthopedic aftercare: Secondary | ICD-10-CM

## 2016-11-29 DIAGNOSIS — Z859 Personal history of malignant neoplasm, unspecified: Secondary | ICD-10-CM

## 2016-11-29 DIAGNOSIS — E1122 Type 2 diabetes mellitus with diabetic chronic kidney disease: Secondary | ICD-10-CM

## 2016-11-29 DIAGNOSIS — Z7902 Long term (current) use of antithrombotics/antiplatelets: Secondary | ICD-10-CM

## 2016-11-29 DIAGNOSIS — Z89421 Acquired absence of other right toe(s): Secondary | ICD-10-CM

## 2016-11-29 DIAGNOSIS — I25810 Atherosclerosis of coronary artery bypass graft(s) without angina pectoris: Secondary | ICD-10-CM

## 2016-11-29 DIAGNOSIS — L97509 Non-pressure chronic ulcer of other part of unspecified foot with unspecified severity: Secondary | ICD-10-CM

## 2016-11-29 DIAGNOSIS — Z981 Arthrodesis status: Secondary | ICD-10-CM

## 2016-11-29 DIAGNOSIS — E1142 Type 2 diabetes mellitus with diabetic polyneuropathy: Principal | ICD-10-CM

## 2016-11-29 DIAGNOSIS — Z09 Encounter for follow-up examination after completed treatment for conditions other than malignant neoplasm: Secondary | ICD-10-CM

## 2016-11-29 DIAGNOSIS — E1151 Type 2 diabetes mellitus with diabetic peripheral angiopathy without gangrene: Secondary | ICD-10-CM

## 2016-11-29 DIAGNOSIS — C801 Malignant (primary) neoplasm, unspecified: Secondary | ICD-10-CM

## 2016-11-29 DIAGNOSIS — M170 Bilateral primary osteoarthritis of knee: Secondary | ICD-10-CM

## 2016-11-29 DIAGNOSIS — N183 Chronic kidney disease, stage 3 (moderate): Secondary | ICD-10-CM

## 2016-11-29 DIAGNOSIS — Z794 Long term (current) use of insulin: Secondary | ICD-10-CM

## 2016-11-29 DIAGNOSIS — K219 Gastro-esophageal reflux disease without esophagitis: Secondary | ICD-10-CM

## 2016-11-29 DIAGNOSIS — M4802 Spinal stenosis, cervical region: Secondary | ICD-10-CM

## 2016-11-29 DIAGNOSIS — E1169 Type 2 diabetes mellitus with other specified complication: Secondary | ICD-10-CM

## 2016-11-29 DIAGNOSIS — Z9861 Coronary angioplasty status: Secondary | ICD-10-CM

## 2016-11-29 DIAGNOSIS — N189 Chronic kidney disease, unspecified: Secondary | ICD-10-CM

## 2016-11-29 DIAGNOSIS — I129 Hypertensive chronic kidney disease with stage 1 through stage 4 chronic kidney disease, or unspecified chronic kidney disease: Secondary | ICD-10-CM

## 2016-11-29 DIAGNOSIS — Z886 Allergy status to analgesic agent status: Secondary | ICD-10-CM

## 2016-11-29 DIAGNOSIS — Z7982 Long term (current) use of aspirin: Secondary | ICD-10-CM

## 2016-11-29 DIAGNOSIS — Z7983 Long term (current) use of bisphosphonates: Secondary | ICD-10-CM

## 2016-11-29 DIAGNOSIS — Z79899 Other long term (current) drug therapy: Secondary | ICD-10-CM

## 2016-11-29 DIAGNOSIS — M869 Osteomyelitis, unspecified: Secondary | ICD-10-CM

## 2016-11-29 DIAGNOSIS — Z951 Presence of aortocoronary bypass graft: Secondary | ICD-10-CM

## 2016-11-29 DIAGNOSIS — N529 Male erectile dysfunction, unspecified: Secondary | ICD-10-CM

## 2016-11-29 DIAGNOSIS — Z9889 Other specified postprocedural states: Secondary | ICD-10-CM

## 2016-11-29 DIAGNOSIS — E11621 Type 2 diabetes mellitus with foot ulcer: Secondary | ICD-10-CM

## 2016-11-29 DIAGNOSIS — E785 Hyperlipidemia, unspecified: Secondary | ICD-10-CM

## 2016-11-29 DIAGNOSIS — N400 Enlarged prostate without lower urinary tract symptoms: Secondary | ICD-10-CM

## 2016-11-29 MED ORDER — CEPHALEXIN 500 MG PO CAPS
500 mg | Freq: Two times a day (BID) | ORAL | 0 refills | Status: CP
Start: 2016-11-29 — End: 2016-12-22

## 2016-11-29 MED ORDER — INSULIN ASPART 100 UNIT/ML SC SOLN
4 refills | Status: CP
Start: 2016-11-29 — End: 2017-12-13

## 2016-11-29 NOTE — Progress Notes
Chief Complaint   ? [name] is a 71   year old male here today for follow-up on type 2  diabetes  HPI \  admitted in between for toe infection   Amputation distal toe   Followed by Podiatry to see today on cephalexin     Type 2 DM    insulin regimen :  Basal rate 0.8  midnight till 7 am then   Basal 1   Carb ratio 1/15    1 for ecvery 50 > 100   Back on   METFORMIN taking only 1000      Accuchecksbetter bolusing better some days running all day low 120-140   occ low 80 rare 300     Hypoglycemia requiring assistance no   Changing site every 3-4 days     A1c. 9 09/2016     Creat  Better 1.47      On atorvastatin. LDL 53  Neuropathy - on gabapentin   Due for eye exam   HTN - on norvasc, metoprolol and losartan   .  Current Meds   Current Outpatient Medications    Medication Sig Start Date End Date Taking? Authorizing Provider   amLODIPine (NORVASC) 10 MG PO Tablet Take 5 mg (1/2 tablet) by mouth 2 times daily. 10/10/16   Malcolm MetroSuryadevara, Siva K, MD   aspirin 81 MG PO Tablet Chewable Chew 1 tablet daily. 11/17/16   Candie ChromanIbrahim, Saif, MD   atorvastatin (LIPITOR) 40 MG Tablet TAKE ONE TABLET BY MOUTH ONCE DAILY 06/02/16   [name], [name], MD   benzonatate (TESSALON) 200 MG PO Capsule TAKE ONE CAPSULE BY MOUTH THREE TIMES DAILY AS NEEDED 07/05/16   Gearlean AlfHadzic, [name], MD   cephALEXin (KEFLEX) 500 MG PO Capsule  10/22/16   Information, Historical   cephALEXin (KEFLEX) 500 MG PO Capsule Take 1 capsule by mouth 2 times daily. 11/22/16   Gearlean AlfHadzic, [name], MD   cetirizine (ZyrTEC) 10 MG Tablet Take 5 mg (1/2 tablet) by mouth daily. 10/17/15   Malcolm MetroSuryadevara, Siva K, MD   cloNIDine (CATAPRES) 0.1 MG PO Tablet Take 1 tablet by mouth 2 times daily as needed for severe hypertension. 10/13/16   Malcolm MetroSuryadevara, Siva K, MD   clopidogrel (PLAVIX) 75 MG PO Tablet TAKE ONE TABLET BY MOUTH ONCE DAILY 07/29/16   Gearlean AlfHadzic, [name], MD   diclofenac (VOLTAREN GEL) 1 % TD Gel Apply 2 g topically 4 times daily. **Measure dose using enclosed dose card** 08/02/16   Gearlean AlfHadzic, [name], MD

## 2016-11-29 NOTE — Progress Notes
Office Visit on 09/22/2016   Component Date Value   ? Glucose 10/05/2016 235*   ? Urea Nitrogen 10/05/2016 30*   ? Creatinine 10/05/2016 1.60*   ? EGFR 10/05/2016 43*   ? Glom Filt Rate, Est Afri* 10/05/2016 50*   ? BUN/Creatinine Ratio 10/05/2016 19    ? Sodium 10/05/2016 136    ? Potassium 10/05/2016 5.5*   ? Chloride 10/05/2016 102    ? CARBON DIOXIDE 10/05/2016 27    ? Calcium 10/05/2016 9.3    ? Protein, Total 10/05/2016 6.7    ? ALBUMIN 10/05/2016 4.1    ? Globulin 10/05/2016 2.6    ? ALBUMIN/GLOBULIN RATIO 10/05/2016 1.6    ? Total Bilirubin 10/05/2016 0.6    ? Alkaline Phosphatase 10/05/2016 80    ? AST 10/05/2016 19    ? ALT 10/05/2016 22    ? WHITE BLOOD CELL COUNT 10/05/2016 5.2    ? RBC 10/05/2016 4.58    ? Hemoglobin 10/05/2016 15.2    ? Hematocrit 10/05/2016 44.6    ? MCV 10/05/2016 97.4    ? [facility] 10/05/2016 33.2*   ? MCHC 10/05/2016 34.1    ? RDW 10/05/2016 12.3    ? Platelets 10/05/2016 221    ? MPV 10/05/2016 10.4    ? Neutrophils Absolute 10/05/2016 3318    ? Lymphocytes Absolute 10/05/2016 1097    ? Monocytes Absolute 10/05/2016 655    ? Eosinophils Absolute 10/05/2016 99    ? Basophils Absolute 10/05/2016 31    ? Neutrophils 10/05/2016 63.8    ? LYMPHOCYTES 10/05/2016 21.1    ? MONOCYTES 10/05/2016 12.6    ? EOSINOPHILS 10/05/2016 1.9    ? BASOPHILS 10/05/2016 0.6    ? TSH, 3RD GENERATION 10/05/2016 2.30    ? Hemoglobin A1C 10/05/2016 9.0*   ? EAG (MG/DL) 10/05/2016 212    ? EAG (MMOL/L) 10/05/2016 11.7    ? Cholesterol, Total 10/05/2016 119    ? HDL 10/05/2016 53    ? Triglycerides 10/05/2016 51    ? LDL Cholesterol 10/05/2016 53    ? LDl/HDL Ratio 10/05/2016 2.2    ? NON-HDL CHOLESTEROL 10/05/2016 66          Personal Hx   Denied Alcohol Use  Never A Smoker    ROS   Constitution: weight stable      no  fever chills or night sweat. Energy level fair  for stated age.  Eyes:no  vision problem.  Cardiovascular:  Denies any acute chest pain at rest or with exertion.  No

## 2016-11-29 NOTE — Progress Notes
?   INR 11/12/2016 1.1    ? PTT 11/12/2016 29    Office Visit on 11/03/2016   Component Date Value   ? Glucose 11/23/2016 271*   ? Urea Nitrogen 11/23/2016 31*   ? Creatinine 11/23/2016 1.47*   ? EGFR 11/23/2016 48*   ? Glom Filt Rate, Est Afri* 11/23/2016 56*   ? BUN/Creatinine Ratio 11/23/2016 21    ? Sodium 11/23/2016 139    ? Potassium 11/23/2016 5.2    ? Chloride 11/23/2016 108    ? CARBON DIOXIDE 11/23/2016 23    ? Calcium 11/23/2016 8.9    Orders Only on 11/01/2016   Component Date Value   ? Culture Result 11/01/2016 No anaerobes isolated    ? Culture Result 11/01/2016 Enterobacter cloacae    ? Direct Smear Suggests 11/01/2016 Few neutrophils    ? Direct Smear Suggests 11/01/2016 No squamous epithelial cells    ? Direct Smear Suggests 11/01/2016 Gram negative rods    Office Visit on 10/05/2016   Component Date Value   ? Hemoglobin A1C, POC 10/05/2016 8.8    ? MICROALBUMIN 10/05/2016 2.4    ? RAM 10/05/2016     ? CREATININE 10/05/2016 129.4    ? [place] 10/05/2016 6.41    ? OXIDANT 10/05/2016 NEGATIVE    ? AMPHETAMINES 10/05/2016 NEGATIVE    ? BARBITURATES 10/05/2016 NEGATIVE    ? BENZODIAZEPINES 10/05/2016 POSITIVE*   ? ALPHAHYDROXYALPRAZOLAM 10/05/2016 NEGATIVE    ? Alphahydroxymidazolam 10/05/2016 NEGATIVE    ? ALPHAHYDROXYTRIAZOLAM 10/05/2016 NEGATIVE    ? AMINOCLONAZEPAM 10/05/2016 NEGATIVE    ? Hydroxyethylflurazepam 10/05/2016 NEGATIVE    ? LORAZEPAM 10/05/2016 230*   ? NORDIAZEPAM 10/05/2016 NEGATIVE    ? OXAZEPAM 10/05/2016 NEGATIVE    ? TEMAZEPAM 10/05/2016 NEGATIVE    ? MARIJUANA METABOLITE 10/05/2016 NEGATIVE    ? COCAINE METABOLITE 10/05/2016 NEGATIVE    ? METHADONE 10/05/2016 NEGATIVE    ? OPIATES 10/05/2016 POSITIVE*   ? CODEINE 10/05/2016 NEGATIVE    ? HYDROCODONE 10/05/2016 504*   ? HYDROMORPHONE 10/05/2016 339*   ? MORPHINE 10/05/2016 NEGATIVE    ? Norhydrocodone 10/05/2016 936*   ? OXYCODONE 10/05/2016 NEGATIVE    ? PHENCYCLIDINE 10/05/2016 NEGATIVE    ? COMMENT 10/05/2016

## 2016-11-29 NOTE — Progress Notes
Admission on 11/15/2016, Discharged on 11/16/2016   Component Date Value   ? Glucose (Meter) 11/15/2016 165*   ? Clinical History 11/15/2016                      Value:This result contains rich text formatting which cannot be displayed here.   ? Final Diagnosis 11/15/2016                      Value:This result contains rich text formatting which cannot be displayed here.   ? Attestation Statement 11/15/2016                      Value:This result contains rich text formatting which cannot be displayed here.   ? Gross Description 11/15/2016                      Value:This result contains rich text formatting which cannot be displayed here.   ? Case Report 11/15/2016                      Value:Surgical Pathology Report                         Case: [id number]                                  Authorizing Provider:  Linda Hedges,    Collected:           11/15/2016 1241                                     DPM                                                                          Ordering Location:     Operating Room (Clinical   Received:            11/15/2016 1612                                     Center)                                                                      Pathologist:           Gerrianne Scale, MD                                                           Specimen:    Toe(s), amputation,  non-trauma, RIGHT FOOT, 2ND DISTAL TOE                                ? Resident/Fellow 11/15/2016                      Value:This result contains rich text formatting which cannot be displayed here.   ? Glucose (Meter) 11/15/2016 169*   ? Glucose (Meter) 11/15/2016 137*   ? Glucose (Meter) 11/15/2016 239*   ? WBC 11/16/2016 6.00    ? RBC 11/16/2016 3.45*   ? Hemoglobin 11/16/2016 11.6*   ? Hematocrit 11/16/2016 34.2*   ? MCV 11/16/2016 99.1    ? [facility] 11/16/2016 33.6    ? MCHC 11/16/2016 33.9    ? RDW 11/16/2016 12.8    ? Platelet Count 11/16/2016 249    ? MPV 11/16/2016 9.8    ? nRBC % 11/16/2016 0.0

## 2016-11-29 NOTE — Progress Notes
Sig: 100 units daily via insulin pump     Dispense:  9 vial     Refill:  4   ? cephALEXin (KEFLEX) 500 MG PO Capsule     Sig: Take 1 capsule by mouth 2 times daily.     Dispense:  14 capsule     Refill:  0     Cont cephalexin till he sees podiatry today    Hypertension BP ok       F/u 2  months

## 2016-11-29 NOTE — Progress Notes
lower extremities edema. No shortness of breath with minimal or moderate exertion.  Respiratory:  No  coughing hemoptysis or wheezing.  Gastrointestinal: No history of nausea, vomiting,  yes heartburn,  no diarrhea, constipation, or abdominal pain. + gerd   Neurological: No  memory problem + +   feet numbness and  burning sensation.   Musculoskeletal: No muscles aching or joints aching.  Psychiatric:  No sign of depression, no  irritability or nervousness.  Endocrine: (as reviewed in HPI).  No heat or cold intolerance.  Genitourinary:  No dysuria or hematuria + nocturia   Hematologic:  No easy bruising.    Vital Signs     There were no vitals filed for this visit.  Physical Exam   Constitution:   1-Vital sign as reviewed in the chart   2-Appearance: Well nourished, Normocephalic.   Cardiovascular:   1-Heart auscultation: regular rhythm no appreciate murmur.   3-Lower extremities: no edema   Right foot 2d toes ulcer wrapped going to see podiatry today   Respiratory:   1-Respiratory effort: No labored breathing.   2-Auscultation: lungs are clear to auscultation bilaterally;no wheezing, rhonchi or rales.  Abdomen soft benign    Musculosketetal:   1-Gait & Station: Normal gait and station, gait is smooth and coordinated.   Psychiatric:   1-Orientation: Patient appears well oriented to place, person and time.   2-Mood & affect: Patient appears in normal mood & affect, no agitation, anxiety or blunt affect.    Assessment   1-DM type 2, uncontrolled  2-Hypertension  3-Hyperlipidemia  4 neuropathy.      Plan   a1c 9.0  accuchecks much better that he's bolusing     Downloaded pump and reviewed data   Cont   Basal rate 0.8  midnight till 7 am   Basal 1   Carb ratio 1/15    1 for ecvery 50 > 100   Metformin 1000 bid       Orders Placed This Encounter   Procedures   ? Hemoglobin A1c   ? Basic Metabolic Panel   ? CBC and Differential     Medication orders placed this encounter   Medications   ? insulin aspart (NovoLOG) vial

## 2016-11-29 NOTE — Progress Notes
?   Absolute NRBC Count 11/16/2016 0.00    ? Sodium 11/16/2016 135    ? Potassium 11/16/2016 4.8*   ? Chloride 11/16/2016 103    ? CO2 11/16/2016 19*   ? Urea Nitrogen 11/16/2016 36*   ? Creatinine 11/16/2016 2.14*   ? BUN/Creatinine Ratio 11/16/2016 16.8    ? Glucose 11/16/2016 133*   ? Calcium 11/16/2016 8.0*   ? Osmolality Calc 11/16/2016 280.3    ? Anion Gap 11/16/2016 13    ? EGFR 11/16/2016 31    Hospital Outpatient Visit on 11/12/2016   Component Date Value   ? Ventricular Rate 11/12/2016 61    ? Atrial Rate 11/12/2016 61    ? P-R Interval 11/12/2016 154    ? QRS Duration 11/12/2016 86    ? Q-T Interval 11/12/2016 410    ? QTC Calculation (Bezet) 11/12/2016 412    ? Calculated P Axis 11/12/2016 62    ? Calculated R Axis 11/12/2016 -9    ? Calculated T Axis 11/12/2016 67    ? Sodium 11/12/2016 137    ? Potassium 11/12/2016 5.5*   ? Chloride 11/12/2016 103    ? CO2 11/12/2016 22    ? Urea Nitrogen 11/12/2016 31*   ? Creatinine 11/12/2016 2.06*   ? BUN/Creatinine Ratio 11/12/2016 15.0    ? Glucose 11/12/2016 113*   ? Calcium 11/12/2016 9.4    ? Osmolality Calc 11/12/2016 281.2    ? Anion Gap 11/12/2016 12    ? EGFR 11/12/2016 32    ? WBC 11/12/2016 5.42    ? RBC 11/12/2016 4.08*   ? Hemoglobin 11/12/2016 13.5*   ? Hematocrit 11/12/2016 40.3    ? MCV 11/12/2016 98.8    ? [place] 11/12/2016 33.1    ? MCHC 11/12/2016 33.5    ? RDW 11/12/2016 12.6    ? Platelet Count 11/12/2016 300    ? MPV 11/12/2016 9.9    ? nRBC % 11/12/2016 0.0    ? Absolute NRBC Count 11/12/2016 0.00    ? Neutrophils % 11/12/2016 61.1    ? Lymphocytes % 11/12/2016 19.4*   ? Monocytes % 11/12/2016 16.4*   ? Eosinophils % 11/12/2016 2.0    ? Immature Granulocytes % 11/12/2016 0.4    ? Neutrophils Absolute 11/12/2016 3.31    ? Lymphocytes Absolute 11/12/2016 1.05    ? Monocytes Absolute 11/12/2016 0.89    ? Eosinophils Absolute 11/12/2016 0.11    ? Basophil Absolute 11/12/2016 0.04    ? Basophils % 11/12/2016 0.7    ? Protime 11/12/2016 13.8

## 2016-11-29 NOTE — Progress Notes
gabapentin (NEURONTIN) 600 MG PO Tablet Take 1 tablet by mouth 3 times daily. 11/22/16   Gearlean AlfHadzic, [name], MD   HYDROcodone-acetaminophen (NORCO) 5-325 MG PO Tablet Take 1 tablet by mouth every 4 to 6 hours as needed for pain. 11/15/16   Feliberto Gottrononovan, Patrick, DPM   HYDROcodone-acetaminophen (NORCO) 5-325 MG PO Tablet Take 1 tablet by mouth every 8 hours as needed for pain. Earliest Fill Date: 12/22/16 12/22/16   Gearlean AlfHadzic, [name], MD   insulin aspart (NovoLOG) vial 100 units daily via insulin pump 11/14/15   Harlin Rainhehade, Joe M, MD   lidocaine (XYLOCAINE) 2 % EX Gel Apply topically as needed 11/01/16   Payton DoughtyHarris, Richard C III, DPM   lidocaine-prilocaine (EMLA) 2.5-2.5 % EX Cream Apply to feet once a day 08/19/16   Gearlean AlfHadzic, [name], MD   LORazepam (ATIVAN) 1 MG PO Tablet 1 pill hs pr n insomnia 10/05/16   [name], [name], MD   losartan-hydroCHLOROthiazide (HYZAAR) 100-25 MG PO Tablet Take 50/12.5 mg (1/2 tablet) by mouth 2 times daily. 08/02/16   Gearlean AlfHadzic, [name], MD   metFORMIN (GLUCOPHAGE) 1000 MG PO Tablet Take 1 tablet by mouth 2 times daily (with meals). SrCr: 1.60* mg/dl (16/1003/13 [id number]) [last value] 11/03/16   Harlin Rainhehade, Joe M, MD   metoprolol succinate (TOPROL-XL) 25 MG PO Tablet Extended Release 24 Hour Take 1 tablet by mouth 2 times daily. 07/05/16   Gearlean AlfHadzic, [name], MD   omeprazole (PriLOSEC) 40 MG Capsule Delayed Release Take 1 capsule by mouth daily. 10/04/14   Schuyler AmorLowe, Allison, ARNP   sildenafil (VIAGRA) 100 MG PO Tablet Take 1 tablet by mouth as needed for erectile dysfunction. 07/05/16   [name], Ermalene SearingAmra, MD   tamsulosin (FLOMAX) 0.4 MG PO Capsule Take 1 capsule by mouth daily. 08/02/16   Gearlean AlfHadzic, [name], MD     Allergies   No Known Drug Allergies.    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  1-DM type 2, uncontrolled  2-ED  3-Hypertension  4-Hyperlipidemia.    PSH   Neck Surgery.  cabg     Family Hx   Paternal history of Diabetes Mellitus  Paternal history of Essential Hypertension  Family history of thyroid cancer?  no  Family history of thyroid disease? no

## 2016-12-02 ENCOUNTER — Encounter: Attending: Family Medicine | Primary: Family Medicine

## 2016-12-07 ENCOUNTER — Encounter: Primary: Family Medicine

## 2016-12-14 ENCOUNTER — Inpatient Hospital Stay: Admit: 2016-12-14 | Discharge: 2016-12-15 | Primary: Family Medicine

## 2016-12-14 ENCOUNTER — Ambulatory Visit: Admit: 2016-12-14 | Discharge: 2016-12-15 | Attending: Podiatrist | Primary: Family Medicine

## 2016-12-14 DIAGNOSIS — I2510 Atherosclerotic heart disease of native coronary artery without angina pectoris: Secondary | ICD-10-CM

## 2016-12-14 DIAGNOSIS — I5189 Other ill-defined heart diseases: Secondary | ICD-10-CM

## 2016-12-14 DIAGNOSIS — E11621 Type 2 diabetes mellitus with foot ulcer: Secondary | ICD-10-CM

## 2016-12-14 DIAGNOSIS — E785 Hyperlipidemia, unspecified: Secondary | ICD-10-CM

## 2016-12-14 DIAGNOSIS — I739 Peripheral vascular disease, unspecified: Principal | ICD-10-CM

## 2016-12-14 DIAGNOSIS — N400 Enlarged prostate without lower urinary tract symptoms: Secondary | ICD-10-CM

## 2016-12-14 DIAGNOSIS — K219 Gastro-esophageal reflux disease without esophagitis: Secondary | ICD-10-CM

## 2016-12-14 DIAGNOSIS — M868X7 Other osteomyelitis, ankle and foot: Secondary | ICD-10-CM

## 2016-12-14 DIAGNOSIS — Z89421 Acquired absence of other right toe(s): Secondary | ICD-10-CM

## 2016-12-14 DIAGNOSIS — Z89429 Acquired absence of other toe(s), unspecified side: Secondary | ICD-10-CM

## 2016-12-14 DIAGNOSIS — Z981 Arthrodesis status: Secondary | ICD-10-CM

## 2016-12-14 DIAGNOSIS — L97509 Non-pressure chronic ulcer of other part of unspecified foot with unspecified severity: Secondary | ICD-10-CM

## 2016-12-14 DIAGNOSIS — Z7902 Long term (current) use of antithrombotics/antiplatelets: Secondary | ICD-10-CM

## 2016-12-14 DIAGNOSIS — Z7983 Long term (current) use of bisphosphonates: Secondary | ICD-10-CM

## 2016-12-14 DIAGNOSIS — E1151 Type 2 diabetes mellitus with diabetic peripheral angiopathy without gangrene: Secondary | ICD-10-CM

## 2016-12-14 DIAGNOSIS — E1122 Type 2 diabetes mellitus with diabetic chronic kidney disease: Secondary | ICD-10-CM

## 2016-12-14 DIAGNOSIS — Z9889 Other specified postprocedural states: Secondary | ICD-10-CM

## 2016-12-14 DIAGNOSIS — Z886 Allergy status to analgesic agent status: Secondary | ICD-10-CM

## 2016-12-14 DIAGNOSIS — Z7982 Long term (current) use of aspirin: Secondary | ICD-10-CM

## 2016-12-14 DIAGNOSIS — Z794 Long term (current) use of insulin: Secondary | ICD-10-CM

## 2016-12-14 DIAGNOSIS — Z833 Family history of diabetes mellitus: Secondary | ICD-10-CM

## 2016-12-14 DIAGNOSIS — I129 Hypertensive chronic kidney disease with stage 1 through stage 4 chronic kidney disease, or unspecified chronic kidney disease: Secondary | ICD-10-CM

## 2016-12-14 DIAGNOSIS — Z9861 Coronary angioplasty status: Secondary | ICD-10-CM

## 2016-12-14 DIAGNOSIS — IMO0002 Toe amputation status, right: Secondary | ICD-10-CM

## 2016-12-14 DIAGNOSIS — I10 Essential (primary) hypertension: Secondary | ICD-10-CM

## 2016-12-14 DIAGNOSIS — Z859 Personal history of malignant neoplasm, unspecified: Secondary | ICD-10-CM

## 2016-12-14 DIAGNOSIS — Z951 Presence of aortocoronary bypass graft: Secondary | ICD-10-CM

## 2016-12-14 DIAGNOSIS — N189 Chronic kidney disease, unspecified: Secondary | ICD-10-CM

## 2016-12-14 DIAGNOSIS — I25810 Atherosclerosis of coronary artery bypass graft(s) without angina pectoris: Principal | ICD-10-CM

## 2016-12-14 DIAGNOSIS — I252 Old myocardial infarction: Secondary | ICD-10-CM

## 2016-12-14 DIAGNOSIS — Z79899 Other long term (current) drug therapy: Secondary | ICD-10-CM

## 2016-12-14 DIAGNOSIS — E1169 Type 2 diabetes mellitus with other specified complication: Secondary | ICD-10-CM

## 2016-12-14 DIAGNOSIS — M869 Osteomyelitis, unspecified: Secondary | ICD-10-CM

## 2016-12-14 MED ORDER — METOPROLOL SUCCINATE ER 25 MG PO TB24
1 refills | Status: CP
Start: 2016-12-14 — End: 2017-11-22

## 2016-12-16 NOTE — Patient Instructions
This patient is being seen for chronic pain and treated with controlled medications   The patients, chronic pain medication is being used to treat  While the patient is taking these medications he/she is being evaluated on an ongoing basis for abuse or misuse and is under a controlled medication/substance agreement.  This patient is monitored monthly, and random drug screens are performed to evaluate for appropriate use, illegal substances, and diversion.Periodic evaluations are performed through the [place] controlled substance data base ([employer]).  For patient also has an ongoing evaluation for other modalities to address his/her pain including over-the-counter medications nonpharmacologic measures to pain control noncontrolled adjunct medications. Referrals are offered, when appropriate for Physical   Therapy, Pain Managment, and subspecialists.  The patient continues to be assessed for the effectiveness and need for medications  The current treatment plan is to continue the pain medications and other modalities mentioned as long as the patient is stable and to adjust these medications and modalities if the condition changes. When it is appropriate, medications are tapered.   The goal of therapy is to manage the patient's pain and improve the patient's function and quality of life.

## 2016-12-21 ENCOUNTER — Ambulatory Visit: Attending: Physician Assistant | Primary: Family Medicine

## 2016-12-21 DIAGNOSIS — Z85820 Personal history of malignant melanoma of skin: Principal | ICD-10-CM

## 2016-12-21 DIAGNOSIS — Z85828 Personal history of other malignant neoplasm of skin: Secondary | ICD-10-CM

## 2016-12-21 DIAGNOSIS — Z6827 Body mass index (BMI) 27.0-27.9, adult: Secondary | ICD-10-CM

## 2016-12-21 DIAGNOSIS — N183 Chronic kidney disease, stage 3 (moderate): Secondary | ICD-10-CM

## 2016-12-21 DIAGNOSIS — I25810 Atherosclerosis of coronary artery bypass graft(s) without angina pectoris: Secondary | ICD-10-CM

## 2016-12-21 DIAGNOSIS — I10 Essential (primary) hypertension: Secondary | ICD-10-CM

## 2016-12-21 DIAGNOSIS — E1122 Type 2 diabetes mellitus with diabetic chronic kidney disease: Secondary | ICD-10-CM

## 2016-12-21 NOTE — Procedures
PATIENT NAME:  Gary FlingERRIE Walters  MRN:   [id number]  DATE OF BIRTH:  1945/12/23  DATE OF SERVICE:  12/14/2016    STUDY:  Ankle-brachial index and multilevel segmental Doppler study and pulse volume recording.     ORDERING PHYSICIAN:  Tiburcio PeaHarris, DPM    INDICATIONS FOR STUDY:  Burning sensation at night, right more than left, status post toe amputation with a long time to heal.    RISK FACTORS:  Diabetes mellitus, hypertension, and coronary artery disease status post percutaneous coronary intervention x2.    FINDINGS:    1.  The ankle brachial index in the right lower extremity was 1.09 in the posterior tibial and 0.93 in the dorsalis pedis.  The ankle brachial index in the left lower extremity was 1.08 in the posterior tibial and 1.05 in the dorsalis pedis.    2.  Segmental Doppler study at the level of the common femoral, popliteal, posterior tibial and dorsalis pedis were normal at the level of the common femoral and popliteal bilaterally and abnormal at the level of the posterior tibial and dorsalis pedis bilaterally.  Pulse volume recording above the knee, below the knee, and at the ankle were all normal at all levels bilaterally.    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      Peggye Pittaniel Soffer, MD    DS/NTS  DD:  12/16/2016 17:45:02  DT:  12/17/2016 07:02:09  Job#:  [id number]  Conf#:  [id number]

## 2016-12-21 NOTE — Procedures
PATIENT NAME:  Gary FlingERRIE Walters  MRN:   [id number]  DATE OF BIRTH:  26-Apr-1946  DATE OF SERVICE:  12/14/2016    STUDY:  Ankle-brachial index and multilevel segmental Doppler study and pulse volume recording.     ORDERING PHYSICIAN:  Tiburcio PeaHarris, DPM    INDICATIONS FOR STUDY:  Burning sensation at night, right more than left, status post toe amputation with a long time to heal.    RISK FACTORS:  Diabetes mellitus, hypertension, and coronary artery disease status post percutaneous coronary intervention x2.    FINDINGS:    1.  The ankle brachial index in the right lower extremity was 1.09 in the posterior tibial and 0.93 in the dorsalis pedis.  The ankle brachial index in the left lower extremity was 1.08 in the posterior tibial and 1.05 in the dorsalis pedis.    2.  Segmental Doppler study at the level of the common femoral, popliteal, posterior tibial and dorsalis pedis were normal at the level of the common femoral and popliteal bilaterally and abnormal at the level of the posterior tibial and dorsalis pedis bilaterally.  Pulse volume recording above the knee, below the knee, and at the ankle were all normal at all levels bilaterally.    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      Peggye Pittaniel Soffer, MD    DS/NTS  DD:  12/16/2016 17:45:02  DT:  12/17/2016 07:02:09  Job#:  [id number]  Conf#:  [id number]

## 2016-12-22 NOTE — Progress Notes
?   Preventive Wellness Visit  07/05/2017   ? Lipid Profile  10/05/2017   ? Urine Microalbumin  10/05/2017   ? Diabetic Foot Exam  10/26/2017   ? Creatinine  11/23/2017   ? Basic Metabolic Panel  11/23/2017   ? Colon Cancer Screening  11/08/2022   ? DTaP,Tdap,and Td Vaccines (2 - Td) 01/17/2024   ? Influenza Vaccine  Addressed   ? Pneumovax / Prevnar  Completed   ? Zoster Vaccine  Excluded

## 2016-12-22 NOTE — Progress Notes
ICD-10-CM ICD-9-CM    1. History of melanoma Z85.820 V10.82 Refer to External Provider   2. History of basal cell carcinoma Z85.828 V10.83 Refer to External Provider   3. Essential hypertension with goal blood pressure less than 130/80 I10 401.9    4. Coronary artery disease involving coronary bypass graft of native heart without angina pectoris I25.810 414.05    5. Type 2 diabetes mellitus with stage 3 chronic kidney disease, without long-term current use of insulin E11.22 250.40     N18.3 585.3    6. BMI 27.0-27.9,adult Z68.27 V85.23           Plan:     1. History of melanoma/BCC  - Following with dermatology.   - Referral placed.     2. Hypertension  - BP stable.  - Goal <130/80.  - Continue current regimen.  - Low salt diet.  - Avoid excessive caffeine.     3. Diabetes  - A1c 9.0% on 10/05/16.  - Discussed possibly discontinuing metformin due to elevated creatinine. Endocrinology states they recently started him on metformin again and are monitoring this.   - Continue to follow with endocrinology.    RTC in 3 months.     Risks and benefits of medication/treatment were discussed. Medications were reconciled and refills/prescriptions were given if needed. Reminded the patient about the importance of follow up visits and remaining compliant with the prescribed treatment plan. Patient was advised to contact our office if they have not heard back from us regarding any testing that was performed or any referrals that were placed. Patient verbalized understanding.      No orders of the following type(s) were placed in this encounter: Medications.     Orders Placed This Encounter   Procedures   ? Refer to External Provider       Health Maintenance was reviewed. The patient's HM Topic list was:                                            Health Maintenance   Topic Date Due   ? Hemoglobin A1C  01/05/2017   ? USPSTF Hepatitis C Screening  06/08/2017 (Originally 1946/01/24)   ? Diabetic Eye Exam  02/10/2017

## 2016-12-22 NOTE — Progress Notes
?   Smoking status: Never Smoker   ? Smokeless tobacco: Never Used   ? Alcohol use No   ? Drug use: No   ? Sexual activity: Yes     Partners: Female     Other Topics Concern   ? Not on file     Social History Narrative   ? No narrative on file     Current Outpatient Prescriptions on File Prior to Visit   Medication Sig   ? amLODIPine (NORVASC) 10 MG PO Tablet Take 5 mg (1/2 tablet) by mouth 2 times daily.   ? aspirin 81 MG PO Tablet Chewable Chew 1 tablet daily.   ? atorvastatin (LIPITOR) 40 MG Tablet TAKE ONE TABLET BY MOUTH ONCE DAILY   ? [DISCONTINUED] benzonatate (TESSALON) 200 MG PO Capsule TAKE ONE CAPSULE BY MOUTH THREE TIMES DAILY AS NEEDED   ? [DISCONTINUED] cephALEXin (KEFLEX) 500 MG PO Capsule Take 1 capsule by mouth 2 times daily.   ? cetirizine (ZyrTEC) 10 MG Tablet Take 5 mg (1/2 tablet) by mouth daily.   ? cloNIDine (CATAPRES) 0.1 MG PO Tablet Take 1 tablet by mouth 2 times daily as needed for severe hypertension.   ? clopidogrel (PLAVIX) 75 MG PO Tablet TAKE ONE TABLET BY MOUTH ONCE DAILY   ? diclofenac (VOLTAREN GEL) 1 % TD Gel Apply 2 g topically 4 times daily. **Measure dose using enclosed dose card**   ? gabapentin (NEURONTIN) 600 MG PO Tablet Take 1 tablet by mouth 3 times daily.   ? HYDROcodone-acetaminophen (NORCO) 5-325 MG PO Tablet Take 1 tablet by mouth every 4 to 6 hours as needed for pain.   ? [START ON 12/22/2016] HYDROcodone-acetaminophen (NORCO) 5-325 MG PO Tablet Take 1 tablet by mouth every 8 hours as needed for pain. Earliest Fill Date: 12/22/16   ? insulin aspart (NovoLOG) vial 100 units daily via insulin pump   ? lidocaine (XYLOCAINE) 2 % EX Gel Apply topically as needed   ? lidocaine-prilocaine (EMLA) 2.5-2.5 % EX Cream Apply to feet once a day   ? LORazepam (ATIVAN) 1 MG PO Tablet 1 pill hs pr n insomnia   ? losartan-hydroCHLOROthiazide (HYZAAR) 100-25 MG PO Tablet Take 50/12.5 mg (1/2 tablet) by mouth 2 times daily.

## 2016-12-22 NOTE — Progress Notes
Subjective:   Brantley Flingerrie Decarlo is a 71 y.o. male being seen today for Referral       HPI     The patient presents to clinic for referral and to follow up on chronic medical conditions. Reports he has history of melanoma and BCC. Currently following with dermatology at [facility]. Needs new referral to continue care with them.    The patient has hypertension. He is on amlodipine, clonidine, metoprolol, and losartan-HCTZ. He is compliant with medications taking them daily and not missing any doses. Does not check his BP at home. Also on Plavix for CAD and compliant with this medication. Denies vision changes, chest pain, palpitations, leg swelling, SOB, dizziness, headaches, syncope.    The patient has diabetes. He is on metformin. He takes only half a pill due to the size of it; but otherwise is compliant with medications taking them daily and not missing any doses. He checks sugars 2-3x/day. Reports 120-140s. Following with endocrinology. They recently took him off of the metformin due to elevated creatinine but recently put him back on it. Denies vision changes, abdominal pain, nausea, diarrhea, polyuria, polydipsia, dizziness, headaches, confusion, tremors. Denies hypoglycemia.    Past Medical History:   Diagnosis Date   ? CAD (coronary artery disease)    ? Cancer    ? CKD (chronic kidney disease) 08/20/2014   ? Diabetes mellitus    ? Hypertension      Past Surgical History:   Procedure Laterality Date   ? CARDIAC CATHERIZATION     ? CERVICAL FUSION     ? CORONARY ANGIOPLASTY     ? CORONARY ARTERY BYPASS GRAFT      3 vessel     Family History   Problem Relation Age of Onset   ? Cancer Mother    ? Diabetes Father    ? Blindness Father    ? Cancer Sister    ? Cancer Brother      Social History     Social History   ? Marital status: Married     Spouse name: N/A   ? Number of children: N/A   ? Years of education: N/A     Occupational History   ? Not on file.     Social History Main Topics

## 2016-12-22 NOTE — Progress Notes
?   metFORMIN (GLUCOPHAGE) 1000 MG PO Tablet Take 1 tablet by mouth 2 times daily (with meals). SrCr: 1.60* mg/dl (16/1003/13 96041225) [last value]   ? metoprolol succinate (TOPROL-XL) 25 MG PO Tablet Extended Release 24 Hour TAKE ONE TABLET BY MOUTH TWICE DAILY   ? omeprazole (PriLOSEC) 40 MG Capsule Delayed Release Take 1 capsule by mouth daily.   ? sildenafil (VIAGRA) 100 MG PO Tablet Take 1 tablet by mouth as needed for erectile dysfunction.   ? tamsulosin (FLOMAX) 0.4 MG PO Capsule Take 1 capsule by mouth daily.     No current facility-administered medications on file prior to visit.      Allergies   Allergen Reactions   ? Nsaids Other (See Comments)     ?Azotemia         Review of Systems  Review of Systems   Constitutional: Negative for chills, fatigue and fever.   HENT: Negative for congestion, ear discharge, ear pain, facial swelling, postnasal drip, rhinorrhea, sinus pain, sinus pressure, sneezing and sore throat.    Eyes: Negative for pain, discharge, itching and visual disturbance.   Respiratory: Negative for cough, chest tightness, shortness of breath and wheezing.    Cardiovascular: Negative for chest pain, palpitations and leg swelling.   Gastrointestinal: Negative for abdominal pain, constipation, diarrhea, nausea and vomiting.   Endocrine: Negative for polydipsia and polyuria.   Genitourinary: Negative for difficulty urinating, dysuria, flank pain, frequency, hematuria and urgency.   Musculoskeletal: Negative for arthralgias, back pain, joint swelling and neck pain.   Neurological: Negative for dizziness, tremors, syncope, weakness, light-headedness, numbness and headaches.           Objective:        VITAL SIGNS (all recorded)      Clinic Vitals     Row Name 12/21/16 1604                Amb Encounter Vitals    Weight 86.8 kg (191 lb 6.4 oz)    -GS at 12/21/16 1605       Height 1.778 m (5\' 10" )    -GS at 12/21/16 1605       BMI (Calculated) 27.52    -GS at 12/21/16 1605       BSA (Calculated - sq m) 2.07

## 2016-12-22 NOTE — Progress Notes
-  GS at 12/21/16 1605       BP 91/59    -GS at 12/21/16 1605       BP Location Left upper arm    -GS at 12/21/16 1605       Position Sitting    -GS at 12/21/16 1605       Pulse 72    -GS at 12/21/16 1605       Resp 18    -GS at 12/21/16 1605       Temp 36.3 ?C (97.4 ?F)    -GS at 12/21/16 1605       Temperature Source Oral    -GS at 12/21/16 1605       Pain Score Zero    -GS at 12/21/16 1605          Education/Communication Barriers?    Learning/Communication Barriers? No    -GS at 12/21/16 1605          Fall Risk Assessment    Had recent fall / Last 6 months? No recent fall    -GS at 12/21/16 1605       Does patient have a fear of falling? No    -GS at 12/21/16 1605         User Key  (r) = Recorded By, (t) = Taken By, (c) = Cosigned By    Initials Name Effective Dates    GS Zollie BeckersStevens, Gracie, MA 02/10/16 -         Physical Exam   Constitutional: He is oriented to person, place, and time. He appears well-developed and well-nourished.   HENT:   Head: Normocephalic.   Right Ear: External ear normal.   Left Ear: External ear normal.   Nose: Nose normal.   Mouth/Throat: Oropharynx is clear and moist.   Eyes: Conjunctivae and EOM are normal. Right eye exhibits no discharge. Left eye exhibits no discharge.   Neck: Normal range of motion.   Cardiovascular: Normal rate, regular rhythm and normal heart sounds.  Exam reveals no gallop and no friction rub.    No murmur heard.  Pulmonary/Chest: Effort normal and breath sounds normal. No stridor. No respiratory distress. He has no wheezes. He has no rales. He exhibits no tenderness.   Abdominal: Soft. Bowel sounds are normal. He exhibits no distension. There is no tenderness.   Musculoskeletal: Normal range of motion. He exhibits no edema.   Neurological: He is alert and oriented to person, place, and time.   Skin: Skin is warm and dry.   Psychiatric: He has a normal mood and affect. His behavior is normal. Thought content normal.   Vitals reviewed.       Assessment:

## 2017-01-12 ENCOUNTER — Ambulatory Visit: Admit: 2017-01-12 | Discharge: 2017-01-12 | Attending: Podiatrist | Primary: Family Medicine

## 2017-01-12 DIAGNOSIS — Z7983 Long term (current) use of bisphosphonates: Secondary | ICD-10-CM

## 2017-01-12 DIAGNOSIS — Z7902 Long term (current) use of antithrombotics/antiplatelets: Secondary | ICD-10-CM

## 2017-01-12 DIAGNOSIS — I129 Hypertensive chronic kidney disease with stage 1 through stage 4 chronic kidney disease, or unspecified chronic kidney disease: Secondary | ICD-10-CM

## 2017-01-12 DIAGNOSIS — Z833 Family history of diabetes mellitus: Secondary | ICD-10-CM

## 2017-01-12 DIAGNOSIS — Z4781 Encounter for orthopedic aftercare following surgical amputation: Principal | ICD-10-CM

## 2017-01-12 DIAGNOSIS — Z794 Long term (current) use of insulin: Secondary | ICD-10-CM

## 2017-01-12 DIAGNOSIS — Z886 Allergy status to analgesic agent status: Secondary | ICD-10-CM

## 2017-01-12 DIAGNOSIS — B351 Tinea unguium: Secondary | ICD-10-CM

## 2017-01-12 DIAGNOSIS — E1151 Type 2 diabetes mellitus with diabetic peripheral angiopathy without gangrene: Secondary | ICD-10-CM

## 2017-01-12 DIAGNOSIS — E1142 Type 2 diabetes mellitus with diabetic polyneuropathy: Secondary | ICD-10-CM

## 2017-01-12 DIAGNOSIS — Z9889 Other specified postprocedural states: Secondary | ICD-10-CM

## 2017-01-12 DIAGNOSIS — Z85820 Personal history of malignant melanoma of skin: Secondary | ICD-10-CM

## 2017-01-12 DIAGNOSIS — K219 Gastro-esophageal reflux disease without esophagitis: Secondary | ICD-10-CM

## 2017-01-12 DIAGNOSIS — I739 Peripheral vascular disease, unspecified: Secondary | ICD-10-CM

## 2017-01-12 DIAGNOSIS — M179 Osteoarthritis of knee, unspecified: Secondary | ICD-10-CM

## 2017-01-12 DIAGNOSIS — I2510 Atherosclerotic heart disease of native coronary artery without angina pectoris: Secondary | ICD-10-CM

## 2017-01-12 DIAGNOSIS — Z951 Presence of aortocoronary bypass graft: Secondary | ICD-10-CM

## 2017-01-12 DIAGNOSIS — N400 Enlarged prostate without lower urinary tract symptoms: Secondary | ICD-10-CM

## 2017-01-12 DIAGNOSIS — Z981 Arthrodesis status: Secondary | ICD-10-CM

## 2017-01-12 DIAGNOSIS — Z89421 Acquired absence of other right toe(s): Secondary | ICD-10-CM

## 2017-01-12 DIAGNOSIS — E1122 Type 2 diabetes mellitus with diabetic chronic kidney disease: Secondary | ICD-10-CM

## 2017-01-12 DIAGNOSIS — Z79899 Other long term (current) drug therapy: Secondary | ICD-10-CM

## 2017-01-12 DIAGNOSIS — N189 Chronic kidney disease, unspecified: Secondary | ICD-10-CM

## 2017-01-12 DIAGNOSIS — E1140 Type 2 diabetes mellitus with diabetic neuropathy, unspecified: Secondary | ICD-10-CM

## 2017-01-12 DIAGNOSIS — E785 Hyperlipidemia, unspecified: Secondary | ICD-10-CM

## 2017-01-12 DIAGNOSIS — Z7982 Long term (current) use of aspirin: Secondary | ICD-10-CM

## 2017-01-12 DIAGNOSIS — I252 Old myocardial infarction: Secondary | ICD-10-CM

## 2017-01-12 MED ORDER — LIDOCAINE-PRILOCAINE 2.5-2.5 % EX CREA
10 refills | Status: CP
Start: 2017-01-12 — End: 2017-12-28

## 2017-01-18 ENCOUNTER — Inpatient Hospital Stay: Attending: Podiatrist | Primary: Family Medicine

## 2017-01-19 ENCOUNTER — Ambulatory Visit: Attending: Family Medicine | Primary: Family Medicine

## 2017-01-19 DIAGNOSIS — Z85820 Personal history of malignant melanoma of skin: Secondary | ICD-10-CM

## 2017-01-19 DIAGNOSIS — E11621 Type 2 diabetes mellitus with foot ulcer: Secondary | ICD-10-CM

## 2017-01-19 DIAGNOSIS — Z6827 Body mass index (BMI) 27.0-27.9, adult: Secondary | ICD-10-CM

## 2017-01-19 DIAGNOSIS — E119 Type 2 diabetes mellitus without complications: Principal | ICD-10-CM

## 2017-01-19 DIAGNOSIS — Z9641 Presence of insulin pump (external) (internal): Secondary | ICD-10-CM

## 2017-01-19 DIAGNOSIS — I2510 Atherosclerotic heart disease of native coronary artery without angina pectoris: Secondary | ICD-10-CM

## 2017-01-19 DIAGNOSIS — L97509 Non-pressure chronic ulcer of other part of unspecified foot with unspecified severity: Secondary | ICD-10-CM

## 2017-01-19 DIAGNOSIS — G4700 Insomnia, unspecified: Principal | ICD-10-CM

## 2017-01-19 DIAGNOSIS — N189 Chronic kidney disease, unspecified: Secondary | ICD-10-CM

## 2017-01-19 DIAGNOSIS — E1151 Type 2 diabetes mellitus with diabetic peripheral angiopathy without gangrene: Secondary | ICD-10-CM

## 2017-01-19 DIAGNOSIS — I10 Essential (primary) hypertension: Secondary | ICD-10-CM

## 2017-01-19 DIAGNOSIS — E1122 Type 2 diabetes mellitus with diabetic chronic kidney disease: Secondary | ICD-10-CM

## 2017-01-19 DIAGNOSIS — C801 Malignant (primary) neoplasm, unspecified: Secondary | ICD-10-CM

## 2017-01-19 DIAGNOSIS — I25810 Atherosclerosis of coronary artery bypass graft(s) without angina pectoris: Secondary | ICD-10-CM

## 2017-01-19 DIAGNOSIS — N183 Chronic kidney disease, stage 3 (moderate): Secondary | ICD-10-CM

## 2017-01-19 DIAGNOSIS — N400 Enlarged prostate without lower urinary tract symptoms: Secondary | ICD-10-CM

## 2017-01-19 DIAGNOSIS — M869 Osteomyelitis, unspecified: Secondary | ICD-10-CM

## 2017-01-19 DIAGNOSIS — Z951 Presence of aortocoronary bypass graft: Secondary | ICD-10-CM

## 2017-01-19 DIAGNOSIS — E1169 Type 2 diabetes mellitus with other specified complication: Secondary | ICD-10-CM

## 2017-01-19 DIAGNOSIS — M4802 Spinal stenosis, cervical region: Secondary | ICD-10-CM

## 2017-01-19 DIAGNOSIS — M542 Cervicalgia: Secondary | ICD-10-CM

## 2017-01-19 MED ORDER — HYDROCODONE-ACETAMINOPHEN 5-325 MG PO TABS
1 | ORAL_TABLET | Freq: Three times a day (TID) | ORAL | 0 refills | Status: CP | PRN
Start: 2017-01-19 — End: 2017-02-09

## 2017-01-19 MED ORDER — LORAZEPAM 1 MG PO TABS
1 refills | Status: CP
Start: 2017-01-19 — End: 2017-03-17

## 2017-01-19 NOTE — Progress Notes
Orders Placed This Encounter   Procedures   ? POCT glycosylated hemoglobin (Hb A1C)       Health Maintenance was reviewed. The patient's HM Topic list was:                                            Health Maintenance   Topic Date Due   ? USPSTF Hepatitis C Screening  06-16-1946   ? Zoster Vaccine (1 of 2) 03/07/1997   ? Diabetic Eye Exam  02/10/2017   ? Hemoglobin A1C  04/21/2017   ? Preventive Wellness Visit  07/05/2017   ? Lipid Profile  10/05/2017   ? Urine Microalbumin  10/05/2017   ? Diabetic Foot Exam  10/26/2017   ? Creatinine  11/23/2017   ? Basic Metabolic Panel  11/23/2017   ? Colon Cancer Screening  11/08/2022   ? DTaP,Tdap,and Td Vaccines (2 - Td) 01/17/2024   ? Influenza Vaccine  Addressed   ? Pneumovax / Prevnar  Completed     HM    PSA1/16  PROSTATE  COLONOSC    ADV DIRY    FLU10/16;11/17d  PRENV 6/16  PNEUMOV 8/45F  ZOSTAV 6/16r    CBC5/18  CMP  LIPIDS  TSH  B12 1/16  VIT D  URINE DIP    HBA1C 5/16;9/17;3/18  FEET 5/16 podiatry fu  OPHTH10/16r;7/17  URINE MICROALB endo;3/18     EKG 4/16  ECHO 2016  STRESS TEST p    SEG DOPPLER 8/16    CARDIO FU  ENDOCRINOL FU DR CHEHADE -  PAIN MNG FU  NUTRITIONIST 8/16R  VASCULAR FU  DERMATOLOGY FU    PAIN CONTRACT  URINE DOA

## 2017-01-19 NOTE — Progress Notes
daily as needed for severe hypertension.   ? clopidogrel (PLAVIX) 75 MG PO Tablet TAKE ONE TABLET BY MOUTH ONCE DAILY   ? diclofenac (VOLTAREN GEL) 1 % TD Gel Apply 2 g topically 4 times daily. **Measure dose using enclosed dose card**   ? gabapentin (NEURONTIN) 600 MG PO Tablet Take 1 tablet by mouth 3 times daily.   ? [DISCONTINUED] HYDROcodone-acetaminophen (NORCO) 5-325 MG PO Tablet Take 1 tablet by mouth every 8 hours as needed for pain. Earliest Fill Date: 12/22/16   ? insulin aspart (NovoLOG) vial 100 units daily via insulin pump   ? lidocaine (XYLOCAINE) 2 % EX Gel Apply topically as needed   ? lidocaine-prilocaine (EMLA) 2.5-2.5 % EX Cream Apply to feet once a day   ? [DISCONTINUED] LORazepam (ATIVAN) 1 MG PO Tablet 1 pill hs pr n insomnia   ? losartan-hydroCHLOROthiazide (HYZAAR) 100-25 MG PO Tablet Take 50/12.5 mg (1/2 tablet) by mouth 2 times daily.   ? metFORMIN (GLUCOPHAGE) 1000 MG PO Tablet Take 1 tablet by mouth 2 times daily (with meals). SrCr: 1.60* mg/dl (16/1003/13 96041225) [last value]   ? metoprolol succinate (TOPROL-XL) 25 MG PO Tablet Extended Release 24 Hour TAKE ONE TABLET BY MOUTH TWICE DAILY   ? omeprazole (PriLOSEC) 40 MG Capsule Delayed Release Take 1 capsule by mouth daily.   ? sildenafil (VIAGRA) 100 MG PO Tablet Take 1 tablet by mouth as needed for erectile dysfunction.   ? tamsulosin (FLOMAX) 0.4 MG PO Capsule Take 1 capsule by mouth daily.   ? [DISCONTINUED] HYDROcodone-acetaminophen (NORCO) 5-325 MG PO Tablet Take 1 tablet by mouth every 4 to 6 hours as needed for pain.     No current facility-administered medications on file prior to visit.      Allergies   Allergen Reactions   ? Nsaids Other (See Comments)     ?Azotemia         Review of Systems  Review of Systems   All other systems reviewed and are negative.          Objective:        VITAL SIGNS (all recorded)      Clinic Vitals     Row Name 01/19/17 1115                Amb Encounter Vitals    Weight 86.6 kg (191 lb)

## 2017-01-19 NOTE — Patient Instructions
This patient is being seen for chronic pain and treated with controlled medications   The patients, chronic pain medication is being used to treat  While the patient is taking these medications he/she is being evaluated on an ongoing basis for abuse or misuse and is under a controlled medication/substance agreement.  This patient is monitored monthly, and random drug screens are performed to evaluate for appropriate use, illegal substances, and diversion.Periodic evaluations are performed through the [place] controlled substance data base ([employer]).  For patient also has an ongoing evaluation for other modalities to address his/her pain including over-the-counter medications nonpharmacologic measures to pain control noncontrolled adjunct medications. Referrals are offered, when appropriate for Physical   Therapy, Pain Managment, and subspecialists.  The patient continues to be assessed for the effectiveness and need for medications  The current treatment plan is to continue the pain medications and other modalities mentioned as long as the patient is stable and to adjust these medications and modalities if the condition changes. When it is appropriate, medications are tapered.   The goal of therapy is to manage the patient's pain and improve the patient's function and quality of life.

## 2017-01-19 NOTE — Progress Notes
Subjective:   Gary Walters is a 71 y.o. male being seen today for Medications Refill (i am here for medication refills)       HPI Patient is here for follow up of chronic medical conditions and is currently using the medications listed below.  The current medical conditions are mostly stable.     Please see the Review of symtoms below.  H/o cad dm pad htn   All cond stable no cp sob abpain  Needs rf meds   some ribcage pain admits to heavy lifting lately  Followed by cards regularly and tests ok per pt  No other cp sob abpain      Past Medical History:   Diagnosis Date   ? CAD (coronary artery disease)    ? Cancer    ? CKD (chronic kidney disease) 08/20/2014   ? Diabetes mellitus    ? Hypertension      Past Surgical History:   Procedure Laterality Date   ? CARDIAC CATHERIZATION     ? CERVICAL FUSION     ? CORONARY ANGIOPLASTY     ? CORONARY ARTERY BYPASS GRAFT      3 vessel     Family History   Problem Relation Age of Onset   ? Cancer Mother    ? Diabetes Father    ? Blindness Father    ? Cancer Sister    ? Cancer Brother      Social History     Social History   ? Marital status: Married     Spouse name: N/A   ? Number of children: N/A   ? Years of education: N/A     Occupational History   ? Not on file.     Social History Main Topics   ? Smoking status: Never Smoker   ? Smokeless tobacco: Never Used   ? Alcohol use No   ? Drug use: No   ? Sexual activity: Yes     Partners: Female     Other Topics Concern   ? Not on file     Social History Narrative   ? No narrative on file     Current Outpatient Prescriptions on File Prior to Visit   Medication Sig   ? amLODIPine (NORVASC) 10 MG PO Tablet Take 5 mg (1/2 tablet) by mouth 2 times daily.   ? aspirin 81 MG PO Tablet Chewable Chew 1 tablet daily.   ? atorvastatin (LIPITOR) 40 MG Tablet TAKE ONE TABLET BY MOUTH ONCE DAILY   ? cetirizine (ZyrTEC) 10 MG Tablet Take 5 mg (1/2 tablet) by mouth daily.   ? cloNIDine (CATAPRES) 0.1 MG PO Tablet Take 1 tablet by mouth 2 times

## 2017-01-19 NOTE — Progress Notes
without angina pectoris I25.810 414.05    7. Cervical spinal stenosis M48.02 723.0    8. Stage 3 chronic kidney disease N18.3 585.3    9. Type 2 diabetes mellitus with stage 3 chronic kidney disease, without long-term current use of insulin E11.22 250.40     N18.3 585.3    10. Diabetes mellitus with peripheral vascular disease E11.51 250.70      443.81    11. 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 History of cutaneous melanoma Z85.820 V10.82    14. Labile essential hypertension I10 401.9    15. Status post insulin pump placement Z96.41 V45.85           Plan:   Patient is here for follow up of chronic medical conditions and is currently using the medications listed below.  The current medical conditions are mostly stable.     Please see the Review of symtoms below.  Rev notes  Rev labs   rfm eds   fu cards   fu vasc  Fu endocrinol  States seen by dermatology for skin check and is ok  Rf meds   hba1c 7.7  Rev eforce  Fu 1 month  Orders Placed This Encounter   Medications   ? HYDROcodone-acetaminophen (NORCO) 5-325 MG PO Tablet     Sig: Take 1 tablet by mouth every 8 hours as needed for pain. Earliest Fill Date: 01/19/17     Dispense:  90 tablet     Refill:  0     Order Specific Question:   Please specify condition:     Answer:   Non acute pain (30 day maximum)     Order Specific Question:   Prior to prescribing this controlled substance, has the [place] PDMP website been accessed by yourself or designated staff?     Answer:   Yes, The PDMP website has been accessed   ? LORazepam (ATIVAN) 1 MG PO Tablet     Sig: 1 pill hs pr n insomnia     Dispense:  30 tablet     Refill:  1     Order Specific Question:   Prior to prescribing this controlled substance, has the [place] PDMP website been accessed by yourself or designated staff?     Answer:   Yes, The PDMP website has been accessed

## 2017-01-19 NOTE — Progress Notes
-  JJ at 01/19/17 1116       Height 1.778 m (5\' 10" )    -JJ at 01/19/17 1116       BMI (Calculated) 27.46    -JJ at 01/19/17 1116       BSA (Calculated - sq m) 2.07    -JJ at 01/19/17 1116       BP (!)  184/95    -JJ at 01/19/17 1116       BP Location Left upper arm    -JJ at 01/19/17 1116       Position Sitting    -JJ at 01/19/17 1116       Pulse 59    -JJ at 01/19/17 1116       Pulse Source Brachial;Monitor    -JJ at 01/19/17 1116       Pulse Quality Normal    -JJ at 01/19/17 1116       Resp 15    -JJ at 01/19/17 1116       Respiration Quality Normal    -JJ at 01/19/17 1116       Temp 35.8 ?C (96.4 ?F)    -JJ at 01/19/17 1116       Temperature Source Oral    -JJ at 01/19/17 1116       Pain Score Three    -JJ at 01/19/17 1116         User Key  (r) = Recorded By, (t) = Taken By, (c) = Cosigned By    Initials Name Effective Dates    Debby FreibergJJ Joudi, Jennifer, MA 01/20/16 -         Physical Exam   Constitutional: He appears well-developed and well-nourished. No distress.   HENT:   Head: Normocephalic.   Eyes: Pupils are equal, round, and reactive to light.   Cardiovascular: Normal rate, regular rhythm and normal heart sounds.  Exam reveals no gallop and no friction rub.    No murmur heard.  Pulmonary/Chest: Effort normal and breath sounds normal. No respiratory distress. He has no wheezes. He has no rales.   Abdominal: Soft. There is no tenderness.      Tender to palp over l sternocostal area  Assessment:       ICD-10-CM ICD-9-CM    1. Insomnia, unspecified type G47.00 780.52 LORazepam (ATIVAN) 1 MG PO Tablet   2. Cervicalgia M54.2 723.1 HYDROcodone-acetaminophen (NORCO) 5-325 MG PO Tablet   3. Diabetes mellitus without complication E11.9 250.00 POCT glycosylated hemoglobin (Hb A1C)   4. BMI 27.0-27.9,adult Z68.27 V85.23    5. Benign prostatic hyperplasia, unspecified whether lower urinary tract symptoms present N40.0 600.00    6. Coronary artery disease involving coronary bypass graft of native heart

## 2017-01-21 ENCOUNTER — Encounter: Attending: Cardiovascular Disease | Primary: Family Medicine

## 2017-02-09 ENCOUNTER — Ambulatory Visit: Attending: "Endocrinology | Primary: Family Medicine

## 2017-02-09 DIAGNOSIS — I25810 Atherosclerosis of coronary artery bypass graft(s) without angina pectoris: Secondary | ICD-10-CM

## 2017-02-09 DIAGNOSIS — N183 Chronic kidney disease, stage 3 (moderate): Secondary | ICD-10-CM

## 2017-02-09 DIAGNOSIS — I2510 Atherosclerotic heart disease of native coronary artery without angina pectoris: Secondary | ICD-10-CM

## 2017-02-09 DIAGNOSIS — I10 Essential (primary) hypertension: Secondary | ICD-10-CM

## 2017-02-09 DIAGNOSIS — M542 Cervicalgia: Principal | ICD-10-CM

## 2017-02-09 DIAGNOSIS — E119 Type 2 diabetes mellitus without complications: Principal | ICD-10-CM

## 2017-02-09 DIAGNOSIS — E7849 Other hyperlipidemia: Secondary | ICD-10-CM

## 2017-02-09 DIAGNOSIS — E1142 Type 2 diabetes mellitus with diabetic polyneuropathy: Secondary | ICD-10-CM

## 2017-02-09 DIAGNOSIS — M4802 Spinal stenosis, cervical region: Principal | ICD-10-CM

## 2017-02-09 DIAGNOSIS — C801 Malignant (primary) neoplasm, unspecified: Secondary | ICD-10-CM

## 2017-02-09 DIAGNOSIS — G609 Hereditary and idiopathic neuropathy, unspecified: Principal | ICD-10-CM

## 2017-02-09 DIAGNOSIS — N189 Chronic kidney disease, unspecified: Secondary | ICD-10-CM

## 2017-02-09 DIAGNOSIS — Z794 Long term (current) use of insulin: Secondary | ICD-10-CM

## 2017-02-09 MED ORDER — GABAPENTIN 600 MG PO TABS
600 mg | Freq: Three times a day (TID) | ORAL | 3 refills | Status: CP
Start: 2017-02-09 — End: 2018-02-13

## 2017-02-09 MED ORDER — HYDROCODONE-ACETAMINOPHEN 5-325 MG PO TABS
1 | ORAL_TABLET | Freq: Four times a day (QID) | ORAL | 0 refills | Status: CP | PRN
Start: 2017-02-09 — End: 2017-03-17

## 2017-02-09 MED ORDER — EMPAGLIFLOZIN 25 MG PO TABS
25 mg | Freq: Every morning | ORAL | 6 refills | Status: CP
Start: 2017-02-09 — End: 2017-02-09

## 2017-02-09 MED ORDER — DAPAGLIFLOZIN PROPANEDIOL 5 MG PO TABS
5 mg | Freq: Every morning | ORAL | 3 refills | Status: CP
Start: 2017-02-09 — End: 2017-05-30

## 2017-02-09 NOTE — Progress Notes
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 Resident/Fellow 11/15/2016                      Value:This result contains rich text formatting which cannot be displayed here.   ? Glucose (Meter) 11/15/2016 169*   ? Glucose (Meter) 11/15/2016 137*   ? Glucose (Meter) 11/15/2016 239*   ? WBC 11/16/2016 6.00    ? RBC 11/16/2016 3.45*   ? Hemoglobin 11/16/2016 11.6*   ? Hematocrit 11/16/2016 34.2*   ? MCV 11/16/2016 99.1    ? [facility] 11/16/2016 33.6    ? MCHC 11/16/2016 33.9    ? RDW 11/16/2016 12.8    ? Platelet Count 11/16/2016 249    ? MPV 11/16/2016 9.8    ? nRBC % 11/16/2016 0.0    ? Absolute NRBC Count 11/16/2016 0.00    ? Sodium 11/16/2016 135    ? Potassium 11/16/2016 4.8*   ? Chloride 11/16/2016 103    ? CO2 11/16/2016 19*   ? Urea Nitrogen 11/16/2016 36*   ? Creatinine 11/16/2016 2.14*   ? BUN/Creatinine Ratio 11/16/2016 16.8    ? Glucose 11/16/2016 133*   ? Calcium 11/16/2016 8.0*   ? Osmolality Calc 11/16/2016 280.3    ? Anion Gap 11/16/2016 13    ? EGFR 11/16/2016 31    Hospital Outpatient Visit on 11/12/2016   Component Date Value   ? Ventricular Rate 11/12/2016 61    ? Atrial Rate 11/12/2016 61    ? P-R Interval 11/12/2016 154    ? QRS Duration 11/12/2016 86    ? Q-T Interval 11/12/2016 410    ? QTC Calculation (Bezet) 11/12/2016 412    ? Calculated P Axis 11/12/2016 62    ? Calculated R Axis 11/12/2016 -9    ? Calculated T Axis 11/12/2016 67    ? Sodium 11/12/2016 137    ? Potassium 11/12/2016 5.5*   ? Chloride 11/12/2016 103    ? CO2 11/12/2016 22    ? Urea Nitrogen 11/12/2016 31*   ? Creatinine 11/12/2016 2.06*   ? BUN/Creatinine Ratio 11/12/2016 15.0    ? Glucose 11/12/2016 113*   ? Calcium 11/12/2016 9.4    ? Osmolality Calc 11/12/2016 281.2

## 2017-02-09 NOTE — Progress Notes
?   BENZODIAZEPINES 10/05/2016 POSITIVE*   ? ALPHAHYDROXYALPRAZOLAM 10/05/2016 NEGATIVE    ? Alphahydroxymidazolam 10/05/2016 NEGATIVE    ? ALPHAHYDROXYTRIAZOLAM 10/05/2016 NEGATIVE    ? AMINOCLONAZEPAM 10/05/2016 NEGATIVE    ? Hydroxyethylflurazepam 10/05/2016 NEGATIVE    ? LORAZEPAM 10/05/2016 230*   ? NORDIAZEPAM 10/05/2016 NEGATIVE    ? OXAZEPAM 10/05/2016 NEGATIVE    ? TEMAZEPAM 10/05/2016 NEGATIVE    ? MARIJUANA METABOLITE 10/05/2016 NEGATIVE    ? COCAINE METABOLITE 10/05/2016 NEGATIVE    ? METHADONE 10/05/2016 NEGATIVE    ? OPIATES 10/05/2016 POSITIVE*   ? CODEINE 10/05/2016 NEGATIVE    ? HYDROCODONE 10/05/2016 504*   ? HYDROMORPHONE 10/05/2016 339*   ? MORPHINE 10/05/2016 NEGATIVE    ? Norhydrocodone 10/05/2016 936*   ? OXYCODONE 10/05/2016 NEGATIVE    ? PHENCYCLIDINE 10/05/2016 NEGATIVE    ? COMMENT 10/05/2016     Office Visit on 09/22/2016   Component Date Value   ? Glucose 10/05/2016 235*   ? Urea Nitrogen 10/05/2016 30*   ? Creatinine 10/05/2016 1.60*   ? EGFR 10/05/2016 43*   ? Glom Filt Rate, Est Afri* 10/05/2016 50*   ? BUN/Creatinine Ratio 10/05/2016 19    ? Sodium 10/05/2016 136    ? Potassium 10/05/2016 5.5*   ? Chloride 10/05/2016 102    ? CARBON DIOXIDE 10/05/2016 27    ? Calcium 10/05/2016 9.3    ? Protein, Total 10/05/2016 6.7    ? ALBUMIN 10/05/2016 4.1    ? Globulin 10/05/2016 2.6    ? ALBUMIN/GLOBULIN RATIO 10/05/2016 1.6    ? Total Bilirubin 10/05/2016 0.6    ? Alkaline Phosphatase 10/05/2016 80    ? AST 10/05/2016 19    ? ALT 10/05/2016 22    ? WHITE BLOOD CELL COUNT 10/05/2016 5.2    ? RBC 10/05/2016 4.58    ? Hemoglobin 10/05/2016 15.2    ? Hematocrit 10/05/2016 44.6    ? MCV 10/05/2016 97.4    ? [facility] 10/05/2016 33.2*   ? MCHC 10/05/2016 34.1    ? RDW 10/05/2016 12.3    ? Platelets 10/05/2016 221    ? MPV 10/05/2016 10.4    ? Neutrophils Absolute 10/05/2016 3318    ? Lymphocytes Absolute 10/05/2016 1097    ? Monocytes Absolute 10/05/2016 655    ? Eosinophils Absolute 10/05/2016 99

## 2017-02-09 NOTE — Progress Notes
?   Basophils Absolute 10/05/2016 31    ? Neutrophils 10/05/2016 63.8    ? LYMPHOCYTES 10/05/2016 21.1    ? MONOCYTES 10/05/2016 12.6    ? EOSINOPHILS 10/05/2016 1.9    ? BASOPHILS 10/05/2016 0.6    ? TSH, 3RD GENERATION 10/05/2016 2.30    ? Hemoglobin A1C 10/05/2016 9.0*   ? EAG (MG/DL) 16/10/960403/13/2018 540212    ? EAG (MMOL/L) 10/05/2016 11.7    ? Cholesterol, Total 10/05/2016 119    ? HDL 10/05/2016 53    ? Triglycerides 10/05/2016 51    ? LDL Cholesterol 10/05/2016 53    ? LDl/HDL Ratio 10/05/2016 2.2    ? NON-HDL CHOLESTEROL 10/05/2016 66          Personal Hx   Denied Alcohol Use  Never A Smoker    ROS   Constitution: weight sta up and down few  Lbs       no  fever chills or night sweat. Energy level good    Eyes:  Denies   vision problem.  Cardiovascular:  Denies any acute chest pain at rest or with exertion.  No lower extremities edema. No shortness of breath with minimal or moderate exertion.  Respiratory:  No  coughing hemoptysis or wheezing.  Gastrointestinal: No history of nausea, vomiting,  yes heartburn,  no diarrhea, constipation, or abdominal pain. + gerd   Neurological: No  memory problem + +   feet numbness and  burning sensation.   Musculoskeletal: No muscles aching or joints aching.  Psychiatric:  No sign of depression, no  irritability or nervousness.  Endocrine: (as reviewed in HPI).  No heat or cold intolerance.  Genitourinary:  No dysuria or hematuria + nocturia   Hematologic:  No easy bruising.    Vital Signs     Vitals:    02/09/17 1249   BP: 132/64   Pulse: 68   Resp: 18   Weight: 85.3 kg (188 lb)   Height: 1.778 m (5\' 10" )     Physical Exam   Constitution:   1-Vital sign as reviewed in the chart   2-Appearance: Well nourished, Normocephalic.   Cardiovascular:   1-Heart auscultation: regular rhythm no appreciate murmur.   3-Lower extremities: no edema   Respiratory:   1-Respiratory effort: No labored breathing.   2-Auscultation: lungs are clear to auscultation bilaterally;no wheezing,

## 2017-02-09 NOTE — Patient Instructions
This patient is being seen for chronic pain and treated with controlled medications   The patients, chronic pain medication is being used to treat  While the patient is taking these medications he/she is being evaluated on an ongoing basis for abuse or misuse and is under a controlled medication/substance agreement.  This patient is monitored monthly, and random drug screens are performed to evaluate for appropriate use, illegal substances, and diversion.Periodic evaluations are performed through the [place] controlled substance data base ([employer]).  For patient also has an ongoing evaluation for other modalities to address his/her pain including over-the-counter medications nonpharmacologic measures to pain control noncontrolled adjunct medications. Referrals are offered, when appropriate for Physical   Therapy, Pain Managment, and subspecialists.  The patient continues to be assessed for the effectiveness and need for medications  The current treatment plan is to continue the pain medications and other modalities mentioned as long as the patient is stable and to adjust these medications and modalities if the condition changes. When it is appropriate, medications are tapered.   The goal of therapy is to manage the patient's pain and improve the patient's function and quality of life.

## 2017-02-09 NOTE — Progress Notes
?   Anion Gap 11/12/2016 12    ? EGFR 11/12/2016 32    ? WBC 11/12/2016 5.42    ? RBC 11/12/2016 4.08*   ? Hemoglobin 11/12/2016 13.5*   ? Hematocrit 11/12/2016 40.3    ? MCV 11/12/2016 98.8    ? [place] 11/12/2016 33.1    ? MCHC 11/12/2016 33.5    ? RDW 11/12/2016 12.6    ? Platelet Count 11/12/2016 300    ? MPV 11/12/2016 9.9    ? nRBC % 11/12/2016 0.0    ? Absolute NRBC Count 11/12/2016 0.00    ? Neutrophils % 11/12/2016 61.1    ? Lymphocytes % 11/12/2016 19.4*   ? Monocytes % 11/12/2016 16.4*   ? Eosinophils % 11/12/2016 2.0    ? Immature Granulocytes % 11/12/2016 0.4    ? Neutrophils Absolute 11/12/2016 3.31    ? Lymphocytes Absolute 11/12/2016 1.05    ? Monocytes Absolute 11/12/2016 0.89    ? Eosinophils Absolute 11/12/2016 0.11    ? Basophil Absolute 11/12/2016 0.04    ? Basophils % 11/12/2016 0.7    ? Protime 11/12/2016 13.8    ? INR 11/12/2016 1.1    ? PTT 11/12/2016 29    Office Visit on 11/03/2016   Component Date Value   ? Glucose 11/23/2016 271*   ? Urea Nitrogen 11/23/2016 31*   ? Creatinine 11/23/2016 1.47*   ? EGFR 11/23/2016 48*   ? Glom Filt Rate, Est Afri* 11/23/2016 56*   ? BUN/Creatinine Ratio 11/23/2016 21    ? Sodium 11/23/2016 139    ? Potassium 11/23/2016 5.2    ? Chloride 11/23/2016 108    ? CARBON DIOXIDE 11/23/2016 23    ? Calcium 11/23/2016 8.9    Orders Only on 11/01/2016   Component Date Value   ? Culture Result 11/01/2016 No anaerobes isolated    ? Culture Result 11/01/2016 Enterobacter cloacae    ? Direct Smear Suggests 11/01/2016 Few neutrophils    ? Direct Smear Suggests 11/01/2016 No squamous epithelial cells    ? Direct Smear Suggests 11/01/2016 Gram negative rods    Office Visit on 10/05/2016   Component Date Value   ? Hemoglobin A1C, POC 10/05/2016 8.8    ? MICROALBUMIN 10/05/2016 2.4    ? RAM 10/05/2016     ? CREATININE 10/05/2016 129.4    ? [place] 10/05/2016 6.41    ? OXIDANT 10/05/2016 NEGATIVE    ? AMPHETAMINES 10/05/2016 NEGATIVE    ? BARBITURATES 10/05/2016 NEGATIVE

## 2017-02-09 NOTE — Progress Notes
? [facility] 02/04/2017 34.2*   ? MCHC 02/04/2017 34.7    ? RDW 02/04/2017 12.1    ? Platelets 02/04/2017 187    ? MPV 02/04/2017 10.9    ? Neutrophils Absolute 02/04/2017 3822    ? Lymphocytes Absolute 02/04/2017 1218    ? Monocytes Absolute 02/04/2017 609    ? Eosinophils Absolute 02/04/2017 122    ? Basophils Absolute 02/04/2017 29    ? Neutrophils 02/04/2017 65.9    ? LYMPHOCYTES 02/04/2017 21.0    ? MONOCYTES 02/04/2017 10.5    ? EOSINOPHILS 02/04/2017 2.1    ? BASOPHILS 02/04/2017 0.5    ? Glucose 02/04/2017 258*   ? Urea Nitrogen 02/04/2017 35*   ? Creatinine 02/04/2017 1.49*   ? EGFR 02/04/2017 47*   ? Glom Filt Rate, Est Afri* 02/04/2017 55*   ? BUN/Creatinine Ratio 02/04/2017 23*   ? Sodium 02/04/2017 138    ? Potassium 02/04/2017 4.6    ? Chloride 02/04/2017 105    ? CARBON DIOXIDE 02/04/2017 25    ? Calcium 02/04/2017 8.7    Admission on 11/15/2016, Discharged on 11/16/2016   Component Date Value   ? Glucose (Meter) 11/15/2016 165*   ? Clinical History 11/15/2016                      Value:This result contains rich text formatting which cannot be displayed here.   ? Final Diagnosis 11/15/2016                      Value:This result contains rich text formatting which cannot be displayed here.   ? Attestation Statement 11/15/2016                      Value:This result contains rich text formatting which cannot be displayed here.   ? Gross Description 11/15/2016                      Value:This result contains rich text formatting which cannot be displayed here.   ? Case Report 11/15/2016                      Value:Surgical Pathology Report                         Case: [id number]                                  Authorizing Provider:  [name],    Collected:           11/15/2016 1241                                     DPM                                                                          Ordering Location:     Operating Room (Clinical   Received:  11/15/2016 1612

## 2017-02-09 NOTE — Progress Notes
rhonchi or rales.  Musculosketetal:   1-Gait & Station: Normal gait and station, gait is smooth and coordinated.   Psychiatric:   1-Orientation: Patient appears well oriented to place, person and time.   2-Mood & affect: Patient appears in normal mood & affect, no agitation, anxiety or blunt affect.    Assessment   1-DM type 2, uncontrolled  2-Hypertension  3-Hyperlipidemia  4 neuropathy.      Plan   a1c  8.2  accuchecks  Better     Downloaded pump and reviewed data   Cont   Basal rate 0.8  midnight till 7 am then   Basal 1   Carb ratio 1/12    1 for ecvery 50 > 100   Metformin 1000 bid   Add jardiance not covered  [facility] , farxiga and invokana covered   A trial of farxiga low dose gfr borderline    Orders Placed This Encounter   Procedures   ? Hemoglobin A1c   ? Microalbumin/Creat Urine Ratio     Medication orders placed this encounter   Medications   ? gabapentin (NEURONTIN) 600 MG PO Tablet     Sig: Take 1 tablet by mouth 3 times daily.     Dispense:  90 tablet     Refill:  3   ? empagliflozin (JARDIANCE) 25 MG PO Tablet     Sig: Take 1 tablet by mouth every morning.     Dispense:  30 tablet     Refill:  6     Cont cephalexin till he sees podiatry today    Hypertension BP ok       F/u 2  months

## 2017-02-09 NOTE — Progress Notes
Chief Complaint   ? [name] is a 71   year old male here today for follow-up on type 2  diabetes  HPI     Type 2 DM    insulin regimen :  Basal rate 0.8  midnight till 7 am then   Basal 1   Carb ratio 1/15    1 for ecvery 50 > 100     METFORMIN 1000 only one still instructed       Accuchecksbetter       Hypoglycemia requiring assistance no   Changing site every 3-4 days     A1c.  8.2     Creat   1.49      On atorvastatin. LDL 53  Neuropathy - on gabapentin   Due for eye exam   HTN - on norvasc, metoprolol and losartan   .  Current Meds   Current Outpatient Medications    Medication Sig Start Date End Date Taking? Authorizing Provider   amLODIPine (NORVASC) 10 MG PO Tablet Take 5 mg (1/2 tablet) by mouth 2 times daily. 10/10/16  Yes Malcolm MetroSuryadevara, Siva K, MD   aspirin 81 MG PO Tablet Chewable Chew 1 tablet daily. 11/17/16  Yes Candie ChromanIbrahim, Saif, MD   atorvastatin (LIPITOR) 40 MG Tablet TAKE ONE TABLET BY MOUTH ONCE DAILY 06/02/16  Yes [name], [name], MD   cetirizine (ZyrTEC) 10 MG Tablet Take 5 mg (1/2 tablet) by mouth daily. 10/17/15  Yes Malcolm MetroSuryadevara, Siva K, MD   cloNIDine (CATAPRES) 0.1 MG PO Tablet Take 1 tablet by mouth 2 times daily as needed for severe hypertension. 10/13/16  Yes Suryadevara, Germaine PomfretSiva K, MD   clopidogrel (PLAVIX) 75 MG PO Tablet TAKE ONE TABLET BY MOUTH ONCE DAILY 07/29/16  Yes [name], [name], MD   diclofenac (VOLTAREN GEL) 1 % TD Gel Apply 2 g topically 4 times daily. **Measure dose using enclosed dose card** 08/02/16  Yes [name], [name], MD   gabapentin (NEURONTIN) 600 MG PO Tablet Take 1 tablet by mouth 3 times daily. 11/22/16  Yes [name], [name], MD   HYDROcodone-acetaminophen (NORCO) 5-325 MG PO Tablet Take 1 tablet by mouth every 6 hours as needed for pain. 02/09/17  Yes [name], [name], MD   insulin aspart (NovoLOG) vial 100 units daily via insulin pump 11/29/16  Yes Harlin Rainhehade, Joe M, MD   lidocaine (XYLOCAINE) 2 % EX Gel Apply topically as needed 11/01/16  Yes Layla MawHarris, Richard C III, DPM

## 2017-02-09 NOTE — Progress Notes
lidocaine-prilocaine (EMLA) 2.5-2.5 % EX Cream Apply to feet once a day 01/12/17  Yes Musselman, Patterson Hammersmitheddy Manning, DPM   LORazepam (ATIVAN) 1 MG PO Tablet 1 pill hs pr n insomnia 01/19/17  Yes [name], [name], MD   losartan-hydroCHLOROthiazide (HYZAAR) 100-25 MG PO Tablet Take 50/12.5 mg (1/2 tablet) by mouth 2 times daily. 08/02/16  Yes [name], [name], MD   metFORMIN (GLUCOPHAGE) 1000 MG PO Tablet Take 1 tablet by mouth 2 times daily (with meals). SrCr: 1.60* mg/dl (54/0903/13 81191225) [last value] 11/03/16  Yes Chehade, Curlene DolphinJoe M, MD   metoprolol succinate (TOPROL-XL) 25 MG PO Tablet Extended Release 24 Hour TAKE ONE TABLET BY MOUTH TWICE DAILY 12/14/16  Yes [name], [name], MD   omeprazole (PriLOSEC) 40 MG Capsule Delayed Release Take 1 capsule by mouth daily. 10/04/14  Yes Schuyler AmorLowe, Allison, ARNP   sildenafil (VIAGRA) 100 MG PO Tablet Take 1 tablet by mouth as needed for erectile dysfunction. 07/05/16  Yes [name], [name], MD   tamsulosin (FLOMAX) 0.4 MG PO Capsule Take 1 capsule by mouth daily. 08/02/16  Yes [name], [name], MD   HYDROcodone-acetaminophen (NORCO) 5-325 MG PO Tablet Take 1 tablet by mouth every 8 hours as needed for pain. Earliest Fill Date: 01/19/17 01/19/17 02/09/17  Gearlean AlfHadzic, [name], MD     Allergies   No Known Drug Allergies.    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  1-DM type 2, uncontrolled  2-ED  3-Hypertension  4-Hyperlipidemia.    PSH   Neck Surgery.  cabg     Family Hx   Paternal history of Diabetes Mellitus  Paternal history of Essential Hypertension  Family history of thyroid cancer?  no  Family history of thyroid disease? no     Office Visit on 01/19/2017   Component Date Value   ? Hemoglobin A1C, POC 01/19/2017 7.7    Office Visit on 11/29/2016   Component Date Value   ? Hemoglobin A1C 02/04/2017 8.2*   ? EAG (MG/DL) 14/78/295607/13/2018 213189    ? EAG (MMOL/L) 02/04/2017 10.4    ? WHITE BLOOD CELL COUNT 02/04/2017 5.8    ? RBC 02/04/2017 3.98*   ? Hemoglobin 02/04/2017 13.6    ? Hematocrit 02/04/2017 39.2    ? MCV 02/04/2017 98.5

## 2017-03-17 ENCOUNTER — Ambulatory Visit: Attending: Family Medicine | Primary: Family Medicine

## 2017-03-17 DIAGNOSIS — Z6827 Body mass index (BMI) 27.0-27.9, adult: Principal | ICD-10-CM

## 2017-03-17 DIAGNOSIS — M542 Cervicalgia: Secondary | ICD-10-CM

## 2017-03-17 DIAGNOSIS — I2510 Atherosclerotic heart disease of native coronary artery without angina pectoris: Secondary | ICD-10-CM

## 2017-03-17 DIAGNOSIS — Z85820 Personal history of malignant melanoma of skin: Secondary | ICD-10-CM

## 2017-03-17 DIAGNOSIS — N183 Chronic kidney disease, stage 3 (moderate): Secondary | ICD-10-CM

## 2017-03-17 DIAGNOSIS — I25810 Atherosclerosis of coronary artery bypass graft(s) without angina pectoris: Secondary | ICD-10-CM

## 2017-03-17 DIAGNOSIS — N189 Chronic kidney disease, unspecified: Secondary | ICD-10-CM

## 2017-03-17 DIAGNOSIS — N400 Enlarged prostate without lower urinary tract symptoms: Secondary | ICD-10-CM

## 2017-03-17 DIAGNOSIS — G4700 Insomnia, unspecified: Secondary | ICD-10-CM

## 2017-03-17 DIAGNOSIS — C801 Malignant (primary) neoplasm, unspecified: Secondary | ICD-10-CM

## 2017-03-17 DIAGNOSIS — I10 Essential (primary) hypertension: Secondary | ICD-10-CM

## 2017-03-17 DIAGNOSIS — E1151 Type 2 diabetes mellitus with diabetic peripheral angiopathy without gangrene: Secondary | ICD-10-CM

## 2017-03-17 DIAGNOSIS — N521 Erectile dysfunction due to diseases classified elsewhere: Secondary | ICD-10-CM

## 2017-03-17 DIAGNOSIS — Z9641 Presence of insulin pump (external) (internal): Secondary | ICD-10-CM

## 2017-03-17 DIAGNOSIS — E1122 Type 2 diabetes mellitus with diabetic chronic kidney disease: Secondary | ICD-10-CM

## 2017-03-17 DIAGNOSIS — I252 Old myocardial infarction: Secondary | ICD-10-CM

## 2017-03-17 DIAGNOSIS — E119 Type 2 diabetes mellitus without complications: Principal | ICD-10-CM

## 2017-03-17 DIAGNOSIS — Z951 Presence of aortocoronary bypass graft: Secondary | ICD-10-CM

## 2017-03-17 MED ORDER — LORAZEPAM 1 MG PO TABS
1 refills | Status: CP
Start: 2017-03-17 — End: 2017-03-17

## 2017-03-17 MED ORDER — HYDROCODONE-ACETAMINOPHEN 5-325 MG PO TABS
1 | ORAL_TABLET | Freq: Four times a day (QID) | ORAL | 0 refills | Status: CP | PRN
Start: 2017-03-17 — End: 2017-04-07

## 2017-03-17 MED ORDER — SILDENAFIL CITRATE 100 MG PO TABS
100 mg | ORAL | 6 refills | Status: CP | PRN
Start: 2017-03-17 — End: ?

## 2017-03-17 MED ORDER — SILDENAFIL CITRATE 100 MG PO TABS
100 mg | ORAL | 6 refills | Status: CP | PRN
Start: 2017-03-17 — End: 2017-03-17

## 2017-03-17 NOTE — Progress Notes
?   HYDROcodone-acetaminophen (NORCO) 5-325 MG PO Tablet     Sig: Take 1 tablet by mouth every 6 hours as needed for pain. Earliest Fill Date: 03/17/17     Dispense:  120 tablet     Refill:  0     Order Specific Question:   Please specify condition:     Answer:   Non acute pain (30 day maximum)     Order Specific Question:   Prior to prescribing this controlled substance, has the [place] PDMP website been accessed by yourself or designated staff?     Answer:   Yes, The PDMP website has been accessed   ? LORazepam (ATIVAN) 1 MG PO Tablet     Sig: 1 pill hs pr n insomnia     Dispense:  30 tablet     Refill:  1     Order Specific Question:   Prior to prescribing this controlled substance, has the [place] PDMP website been accessed by yourself or designated staff?     Answer:   Yes, The PDMP website has been accessed     No orders of the following type(s) were placed in this encounter: Procedures      Health Maintenance was reviewed. The patient's HM Topic list was:                                            Health Maintenance   Topic Date Due   ? USPSTF Hepatitis C Screening  [birth year]   ? Zoster Vaccine (1 of 2) 03/07/1997   ? Diabetic Eye Exam  02/10/2017   ? Influenza Vaccine (1) 03/26/2017   ? Hemoglobin A1C  05/07/2017   ? Preventive Wellness Visit  07/05/2017   ? Lipid Profile  10/05/2017   ? Urine Microalbumin  10/05/2017   ? Diabetic Foot Exam  10/26/2017   ? Creatinine  02/04/2018   ? Basic Metabolic Panel  02/04/2018   ? Colon Cancer Screening  11/08/2022   ? DTaP,Tdap,and Td Vaccines (2 - Td) 01/17/2024   ? Pneumovax / Prevnar  Completed       HM    PSA1/16  PROSTATE  COLONOSC    ADV DIRY    FLU10/16;11/17d  PRENV 6/16  PNEUMOV 8/22F  ZOSTAV 6/16r    CBC5/18  CMP  LIPIDS  TSH  B12 1/16  VIT D  URINE DIP    HBA1C 5/16;9/17;3/18  FEET 5/16 podiatry fu  OPHTH10/16r;7/17  URINE MICROALB endo;3/18     EKG 4/16  ECHO 2016  STRESS TEST p    SEG DOPPLER 8/16    CARDIO FU  ENDOCRINOL FU DR CHEHADE -  PAIN MNG FU

## 2017-03-17 NOTE — Progress Notes
Subjective:   Gary Walters is a 71 y.o. male being seen today for Medications Refill (i am here for med refills)       HPI    Past Medical History:   Diagnosis Date   ? CAD (coronary artery disease)    ? Cancer    ? CKD (chronic kidney disease) 08/20/2014   ? Diabetes mellitus    ? Hypertension      Past Surgical History:   Procedure Laterality Date   ? CARDIAC CATHERIZATION     ? CERVICAL FUSION     ? CORONARY ANGIOPLASTY     ? CORONARY ARTERY BYPASS GRAFT      3 vessel     Family History   Problem Relation Age of Onset   ? Cancer Mother    ? Diabetes Father    ? Blindness Father    ? Cancer Sister    ? Cancer Brother      Social History     Social History   ? Marital status: Married     Spouse name: N/A   ? Number of children: N/A   ? Years of education: N/A     Occupational History   ? Not on file.     Social History Main Topics   ? Smoking status: Never Smoker   ? Smokeless tobacco: Never Used   ? Alcohol use No   ? Drug use: No   ? Sexual activity: Yes     Partners: Female     Other Topics Concern   ? Not on file     Social History Narrative   ? No narrative on file     Current Outpatient Prescriptions on File Prior to Visit   Medication Sig   ? amLODIPine (NORVASC) 10 MG PO Tablet Take 5 mg (1/2 tablet) by mouth 2 times daily.   ? aspirin 81 MG PO Tablet Chewable Chew 1 tablet daily.   ? atorvastatin (LIPITOR) 40 MG Tablet TAKE ONE TABLET BY MOUTH ONCE DAILY   ? cetirizine (ZyrTEC) 10 MG Tablet Take 5 mg (1/2 tablet) by mouth daily.   ? cloNIDine (CATAPRES) 0.1 MG PO Tablet Take 1 tablet by mouth 2 times daily as needed for severe hypertension.   ? clopidogrel (PLAVIX) 75 MG PO Tablet TAKE ONE TABLET BY MOUTH ONCE DAILY   ? dapagliflozin (FARXIGA) 5 MG PO Tablet Take 1 tablet by mouth every morning.   ? diclofenac (VOLTAREN GEL) 1 % TD Gel Apply 2 g topically 4 times daily. **Measure dose using enclosed dose card**   ? gabapentin (NEURONTIN) 600 MG PO Tablet Take 1 tablet by mouth 3 times daily.

## 2017-03-17 NOTE — Progress Notes
NUTRITIONIST 8/16R  VASCULAR FU  DERMATOLOGY FU    PAIN CONTRACT  URINE DOA

## 2017-03-17 NOTE — Progress Notes
? [  DISCONTINUED] HYDROcodone-acetaminophen (NORCO) 5-325 MG PO Tablet Take 1 tablet by mouth every 6 hours as needed for pain.   ? insulin aspart (NovoLOG) vial 100 units daily via insulin pump   ? lidocaine (XYLOCAINE) 2 % EX Gel Apply topically as needed   ? lidocaine-prilocaine (EMLA) 2.5-2.5 % EX Cream Apply to feet once a day   ? [DISCONTINUED] LORazepam (ATIVAN) 1 MG PO Tablet 1 pill hs pr n insomnia   ? losartan-hydroCHLOROthiazide (HYZAAR) 100-25 MG PO Tablet Take 50/12.5 mg (1/2 tablet) by mouth 2 times daily.   ? metFORMIN (GLUCOPHAGE) 1000 MG PO Tablet Take 1 tablet by mouth 2 times daily (with meals). SrCr: 1.60* mg/dl (27/25 3664) [last value]   ? metoprolol succinate (TOPROL-XL) 25 MG PO Tablet Extended Release 24 Hour TAKE ONE TABLET BY MOUTH TWICE DAILY   ? omeprazole (PriLOSEC) 40 MG Capsule Delayed Release Take 1 capsule by mouth daily.   ? sildenafil (VIAGRA) 100 MG PO Tablet Take 1 tablet by mouth as needed for erectile dysfunction.   ? tamsulosin (FLOMAX) 0.4 MG PO Capsule Take 1 capsule by mouth daily.     No current facility-administered medications on file prior to visit.      Allergies   Allergen Reactions   ? Nsaids Other (See Comments)     ?Azotemia         Review of Systems  Review of Systems        Objective:        VITAL SIGNS (all recorded)      Clinic Vitals     Row Name 03/17/17 1314                Amb Encounter Vitals    Weight 85.5 kg (188 lb 8 oz)    -BS at 03/17/17 1316       Height 1.778 m (5\' 10" )    -BS at 03/17/17 1316       BMI (Calculated) 27.1    -BS at 03/17/17 1316       BSA (Calculated - sq m) 2.05    -BS at 03/17/17 1316       BP 135/78    -BS at 03/17/17 1316       BP Location Left upper arm    -BS at 03/17/17 1316       Position Sitting    -BS at 03/17/17 1316       Pulse 71    -BS at 03/17/17 1316       Pulse Quality Normal    -BS at 03/17/17 1316       Resp 16    -BS at 03/17/17 1316       Respiration Quality Normal    -BS at 03/17/17 1316

## 2017-03-17 NOTE — Patient Instructions
This patient is being seen for chronic pain and treated with controlled medications   The patients, chronic pain medication is being used to treat  While the patient is taking these medications he/she is being evaluated on an ongoing basis for abuse or misuse and is under a controlled medication/substance agreement.  This patient is monitored monthly, and random drug screens are performed to evaluate for appropriate use, illegal substances, and diversion.Periodic evaluations are performed through the [place] controlled substance data base ([employer]).  For patient also has an ongoing evaluation for other modalities to address his/her pain including over-the-counter medications nonpharmacologic measures to pain control noncontrolled adjunct medications. Referrals are offered, when appropriate for Physical   Therapy, Pain Managment, and subspecialists.  The patient continues to be assessed for the effectiveness and need for medications  The current treatment plan is to continue the pain medications and other modalities mentioned as long as the patient is stable and to adjust these medications and modalities if the condition changes. When it is appropriate, medications are tapered.   The goal of therapy is to manage the patient's pain and improve the patient's function and quality of life.

## 2017-03-17 NOTE — Progress Notes
Temp 36.4 ?C (97.5 ?F)    -BS at 03/17/17 1316       Temperature Source Oral    -BS at 03/17/17 1316          Fall Risk Assessment    Had recent fall / Last 6 months? No recent fall    -BS at 03/17/17 1316       Does patient have a fear of falling? No    -BS at 03/17/17 1316         User Key  (r) = Recorded By, (t) = Taken By, (c) = Cosigned By    Initials Name Effective Dates    BS Sampson Si, [place] 10/28/15 -         Physical Exam     Assessment:       ICD-10-CM ICD-9-CM    1. BMI 27.0-27.9,adult Z68.27 V85.23    2. Cervicalgia M54.2 723.1 HYDROcodone-acetaminophen (NORCO) 5-325 MG PO Tablet   3. Insomnia, unspecified type G47.00 780.52 LORazepam (ATIVAN) 1 MG PO Tablet   4. Benign prostatic hyperplasia, unspecified whether lower urinary tract symptoms present N40.0 600.00    5. Coronary artery disease involving coronary bypass graft of native heart without angina pectoris I25.810 414.05    6. Stage 3 chronic kidney disease N18.3 585.3    7. Type 2 diabetes mellitus with stage 3 chronic kidney disease, without long-term current use of insulin E11.22 250.40     N18.3 585.3    8. Diabetes mellitus with peripheral vascular disease E11.51 250.70      443.81    9. History of 3V CABG in 2015 Z95.1 V45.81    10. History of cutaneous melanoma Z85.820 V10.82    11. History of myocardial infarction in 2015 I25.2 412    12. Status post insulin pump placement Z96.41 V45.85           Plan:   Patient advised to various Tx options and agreed to the following:  Continuation of current medications listed above,the importance of follow up visits.    Risk verses benefits of medication and treatments were discussed.  Patient voiced understanding.       rev notes   rev labs   rf meds   fu cards  Fu vasc  Fu endo  Dc lorazepam .the patient states pharmacy doesnt agree to refill due to concomitant use of narcotic pain med.  Healthy diet exercise  Fu 1 month  Orders Placed This Encounter   Medications

## 2017-04-01 ENCOUNTER — Encounter: Attending: Cardiovascular Disease | Primary: Family Medicine

## 2017-04-07 ENCOUNTER — Ambulatory Visit: Attending: Family Medicine | Primary: Family Medicine

## 2017-04-07 DIAGNOSIS — E119 Type 2 diabetes mellitus without complications: Secondary | ICD-10-CM

## 2017-04-07 DIAGNOSIS — Z6826 Body mass index (BMI) 26.0-26.9, adult: Secondary | ICD-10-CM

## 2017-04-07 DIAGNOSIS — Z9641 Presence of insulin pump (external) (internal): Secondary | ICD-10-CM

## 2017-04-07 DIAGNOSIS — Z85820 Personal history of malignant melanoma of skin: Secondary | ICD-10-CM

## 2017-04-07 DIAGNOSIS — M4802 Spinal stenosis, cervical region: Secondary | ICD-10-CM

## 2017-04-07 DIAGNOSIS — N183 Chronic kidney disease, stage 3 (moderate): Secondary | ICD-10-CM

## 2017-04-07 DIAGNOSIS — F411 Generalized anxiety disorder: Secondary | ICD-10-CM

## 2017-04-07 DIAGNOSIS — I252 Old myocardial infarction: Secondary | ICD-10-CM

## 2017-04-07 DIAGNOSIS — E1151 Type 2 diabetes mellitus with diabetic peripheral angiopathy without gangrene: Secondary | ICD-10-CM

## 2017-04-07 DIAGNOSIS — M542 Cervicalgia: Principal | ICD-10-CM

## 2017-04-07 DIAGNOSIS — Z951 Presence of aortocoronary bypass graft: Secondary | ICD-10-CM

## 2017-04-07 DIAGNOSIS — Z981 Arthrodesis status: Secondary | ICD-10-CM

## 2017-04-07 DIAGNOSIS — I25810 Atherosclerosis of coronary artery bypass graft(s) without angina pectoris: Secondary | ICD-10-CM

## 2017-04-07 MED ORDER — CLOPIDOGREL BISULFATE 75 MG PO TABS: Start: 2017-04-07 — End: 2017-04-07

## 2017-04-07 MED ORDER — HYDROCODONE-ACETAMINOPHEN 5-325 MG PO TABS
ORAL_TABLET
Start: 2017-04-07 — End: 2017-04-07

## 2017-04-07 MED ORDER — ONETOUCH ULTRA BLUE VI STRP
ORAL_STRIP | Freq: Two times a day (BID) | 6 refills | Status: CP
Start: 2017-04-07 — End: 2017-11-22

## 2017-04-07 MED ORDER — OMEPRAZOLE 40 MG PO CPDR
40 mg | ORAL
Start: 2017-04-07 — End: 2017-04-07

## 2017-04-07 MED ORDER — MUPIROCIN CALCIUM 2 % EX CREA: Start: 2017-04-07 — End: 2017-10-20

## 2017-04-07 MED ORDER — TAMSULOSIN HCL 0.4 MG PO CAPS: Start: 2017-04-07 — End: 2017-04-07

## 2017-04-07 MED ORDER — METOPROLOL SUCCINATE ER 25 MG PO TB24: Start: 2017-04-07 — End: 2017-04-07

## 2017-04-07 MED ORDER — LORAZEPAM 1 MG PO TABS: Start: 2017-04-07 — End: 2017-04-07

## 2017-04-07 MED ORDER — LOSARTAN POTASSIUM 50 MG PO TABS: Start: 2017-04-07 — End: 2017-04-07

## 2017-04-07 MED ORDER — HYDROCODONE-ACETAMINOPHEN 5-325 MG PO TABS
1 | ORAL_TABLET | Freq: Four times a day (QID) | ORAL | 0 refills | Status: CP | PRN
Start: 2017-04-07 — End: 2017-05-05

## 2017-04-07 MED ORDER — DIAZEPAM 5 MG PO TABS
5 mg | ORAL
Start: 2017-04-07 — End: 2017-04-07

## 2017-04-07 MED ORDER — IPRATROPIUM BROMIDE 0.06 % NA SOLN: Start: 2017-04-07 — End: 2017-05-05

## 2017-04-07 MED ORDER — SERTRALINE HCL 50 MG PO TABS
50 mg | Freq: Every day | ORAL | 6 refills | Status: CP
Start: 2017-04-07 — End: 2017-12-28

## 2017-04-07 MED ORDER — GABAPENTIN 300 MG PO CAPS: Start: 2017-04-07 — End: 2017-04-07

## 2017-04-07 MED ORDER — AMLODIPINE BESYLATE 10 MG PO TABS: Start: 2017-04-07 — End: 2017-04-07

## 2017-04-07 MED ORDER — LOSARTAN POTASSIUM 100 MG PO TABS: Start: 2017-04-07 — End: 2017-04-07

## 2017-04-07 MED ORDER — INSULIN ASPART 100 UNIT/ML SC SOLN: Start: 2017-04-07 — End: 2017-04-07

## 2017-04-07 MED ORDER — LOSARTAN POTASSIUM-HCTZ 100-25 MG PO TABS
ORAL_TABLET
Start: 2017-04-07 — End: 2017-04-07

## 2017-04-07 MED ORDER — BROMFENAC SODIUM 0.07 % OP SOLN: Start: 2017-04-07 — End: 2017-10-20

## 2017-04-07 MED ORDER — ONETOUCH ULTRA BLUE VI STRP
ORAL_STRIP
Start: 2017-04-07 — End: 2017-04-07

## 2017-04-07 NOTE — Progress Notes
-  FC at 04/07/17 1347       BMI (Calculated) 26.56    -FC at 04/07/17 1347       BSA (Calculated - sq m) 2.07    -FC at 04/07/17 1347       BP 140/79    -FC at 04/07/17 1347       Pulse 65    -FC at 04/07/17 1347       Resp 18    -FC at 04/07/17 1347       Temp 35.7 ?C (96.3 ?F)    -FC at 04/07/17 1347         User Key  (r) = Recorded By, (t) = Taken By, (c) = Cosigned By    Initials Name Effective Dates    FC Williemae Natter, MA 10/28/15 -         Physical Exam   Constitutional: He appears well-developed and well-nourished. No distress.   HENT:   Head: Normocephalic.   Eyes: Pupils are equal, round, and reactive to light.   Cardiovascular: Normal rate, regular rhythm and normal heart sounds.  Exam reveals no gallop and no friction rub.    No murmur heard.  Pulmonary/Chest: Effort normal and breath sounds normal. No respiratory distress. He has no wheezes. He has no rales.   Abdominal: Soft. There is no tenderness.        Assessment:       ICD-10-CM ICD-9-CM    1. Cervicalgia M54.2 723.1 HYDROcodone-acetaminophen (NORCO) 5-325 MG PO Tablet   2. BMI 26.0-26.9,adult Z68.26 V85.22    3. Type 2 diabetes mellitus without complication, without long-term current use of insulin E11.9 250.00 ONE TOUCH ULTRA TEST VI Strip   4. Anxiety state F41.1 300.00 sertraline (ZOLOFT) 50 MG PO Tablet   5. Coronary artery disease involving coronary bypass graft of native heart without angina pectoris I25.810 414.05    6. Cervical spinal stenosis M48.02 723.0    7. Stage 3 chronic kidney disease N18.3 585.3    8. Diabetes mellitus with peripheral vascular disease E11.51 250.70      443.81    9. History of 3V CABG in 2015 Z95.1 V45.81    10. History of cutaneous melanoma Z85.820 V10.82    11. History of myocardial infarction in 2015 I25.2 412    12. Status post cervical spinal fusion Z98.1 V45.4    13. Status post insulin pump placement Z96.41 V45.85           Plan:   Patient advised to various Tx options and agreed to the following:

## 2017-04-07 NOTE — Progress Notes
PROSTATE  COLONOSC    ADV DIRY    FLU10/16;11/17d  PRENV 6/16  PNEUMOV 8/57F  ZOSTAV 6/16r    CBC5/18  CMP  LIPIDS  TSH  B12 1/16  VIT D  URINE DIP    HBA1C 5/16;9/17;3/18  FEET 5/16 podiatry fu  OPHTH10/16r;7/17  URINE MICROALB endo;3/18     EKG 4/16  ECHO 2016  STRESS TEST p    SEG DOPPLER 8/16    CARDIO FU  ENDOCRINOL FU DR CHEHADE -  PAIN MNG FU  NUTRITIONIST 8/16R  VASCULAR FU  DERMATOLOGY FU    PAIN CONTRACT  URINE DOA

## 2017-04-07 NOTE — Progress Notes
Subjective:   Gary Walters is a 71 y.o. male being seen today for Medications Refill (here for pain med refill)       HPI  Patient is here for follow up of chronic medical conditions and is currently using the medications listed below.  The current medical conditions are mostly stable.     Please see the Review of symtoms below.  H/o significant cad , dm on insulin pump , pvd with a toe amputation, htn  All cond stable  Followed by the specialties  No cp sob pabian  Feels worried a lot and with anxiety  Since off of benzo anxiety more prominent  No depression s/ h ideations    Past Medical History:   Diagnosis Date   ? CAD (coronary artery disease)    ? Cancer    ? CKD (chronic kidney disease) 08/20/2014   ? Diabetes mellitus    ? Hypertension      Past Surgical History:   Procedure Laterality Date   ? CARDIAC CATHERIZATION     ? CERVICAL FUSION     ? CORONARY ANGIOPLASTY     ? CORONARY ARTERY BYPASS GRAFT      3 vessel     Family History   Problem Relation Age of Onset   ? Cancer Mother    ? Diabetes Father    ? Blindness Father    ? Cancer Sister    ? Cancer Brother      Social History     Social History   ? Marital status: Married     Spouse name: N/A   ? Number of children: N/A   ? Years of education: N/A     Occupational History   ? Not on file.     Social History Main Topics   ? Smoking status: Never Smoker   ? Smokeless tobacco: Never Used   ? Alcohol use No   ? Drug use: No   ? Sexual activity: Yes     Partners: Female     Other Topics Concern   ? Not on file     Social History Narrative   ? No narrative on file     Current Outpatient Prescriptions on File Prior to Visit   Medication Sig   ? amLODIPine (NORVASC) 10 MG PO Tablet Take 5 mg (1/2 tablet) by mouth 2 times daily.   ? aspirin 81 MG PO Tablet Chewable Chew 1 tablet daily.   ? atorvastatin (LIPITOR) 40 MG Tablet TAKE ONE TABLET BY MOUTH ONCE DAILY   ? cetirizine (ZyrTEC) 10 MG Tablet Take 5 mg (1/2 tablet) by mouth daily.

## 2017-04-07 NOTE — Patient Instructions
This patient is being seen for chronic pain and treated with controlled medications   The patients, chronic pain medication is being used to treat  While the patient is taking these medications he/she is being evaluated on an ongoing basis for abuse or misuse and is under a controlled medication/substance agreement.  This patient is monitored monthly, and random drug screens are performed to evaluate for appropriate use, illegal substances, and diversion.Periodic evaluations are performed through the [place] controlled substance data base ([employer]).  For patient also has an ongoing evaluation for other modalities to address his/her pain including over-the-counter medications nonpharmacologic measures to pain control noncontrolled adjunct medications. Referrals are offered, when appropriate for Physical   Therapy, Pain Managment, and subspecialists.  The patient continues to be assessed for the effectiveness and need for medications  The current treatment plan is to continue the pain medications and other modalities mentioned as long as the patient is stable and to adjust these medications and modalities if the condition changes. When it is appropriate, medications are tapered.   The goal of therapy is to manage the patient's pain and improve the patient's function and quality of life.

## 2017-04-07 NOTE — Progress Notes
Continuation of current medications listed above,the importance of follow up visits.    Risk verses benefits of medication and treatments were discussed.  Patient voiced understanding.      Rev notews  Rev labs  Fu endocrinology  Fu cardiol as sch  Fu vascular surg as sch  Monitor scalp lesion for changes  Rx zoloft for anxiety and worries  Declines flu shot  Rf meds  Fu 1 monht  Orders Placed This Encounter   Medications   ? HYDROcodone-acetaminophen (NORCO) 5-325 MG PO Tablet     Sig: Take 1 tablet by mouth every 6 hours as needed for pain. Earliest Fill Date: 04/07/17     Dispense:  120 tablet     Refill:  0     Order Specific Question:   Please specify condition:     Answer:   Non acute pain (30 day maximum)     Order Specific Question:   Prior to prescribing this controlled substance, has the [place] PDMP website been accessed by yourself or designated staff?     Answer:   Yes, The PDMP website has been accessed   ? ONE TOUCH ULTRA TEST VI Strip     Sig: 2 times daily (before breakfast and dinner).     Dispense:  100 each     Refill:  6   ? sertraline (ZOLOFT) 50 MG PO Tablet     Sig: Take 1 tablet by mouth daily.     Dispense:  30 tablet     Refill:  6     No orders of the following type(s) were placed in this encounter: Procedures      Health Maintenance was reviewed. The patient's HM Topic list was:                                            Health Maintenance   Topic Date Due   ? USPSTF Hepatitis C Screening  [birth year]   ? Zoster Vaccine (1 of 2) 03/07/1997   ? Diabetic Eye Exam  02/10/2017   ? Influenza Vaccine (1) 03/26/2017   ? Hemoglobin A1C  05/07/2017   ? Preventive Wellness Visit  07/05/2017   ? Lipid Profile  10/05/2017   ? Urine Microalbumin  10/05/2017   ? Diabetic Foot Exam  10/26/2017   ? Creatinine  02/04/2018   ? Basic Metabolic Panel  02/04/2018   ? Colon Cancer Screening  11/08/2022   ? DTaP,Tdap,and Td Vaccines (2 - Td) 01/17/2024   ? Pneumovax / Prevnar  Completed     HM    PSA1/16

## 2017-04-07 NOTE — Progress Notes
?   cloNIDine (CATAPRES) 0.1 MG PO Tablet Take 1 tablet by mouth 2 times daily as needed for severe hypertension.   ? clopidogrel (PLAVIX) 75 MG PO Tablet TAKE ONE TABLET BY MOUTH ONCE DAILY   ? dapagliflozin (FARXIGA) 5 MG PO Tablet Take 1 tablet by mouth every morning.   ? diclofenac (VOLTAREN GEL) 1 % TD Gel Apply 2 g topically 4 times daily. **Measure dose using enclosed dose card**   ? gabapentin (NEURONTIN) 600 MG PO Tablet Take 1 tablet by mouth 3 times daily.   ? [DISCONTINUED] HYDROcodone-acetaminophen (NORCO) 5-325 MG PO Tablet Take 1 tablet by mouth every 6 hours as needed for pain. Earliest Fill Date: 03/17/17   ? insulin aspart (NovoLOG) vial 100 units daily via insulin pump   ? lidocaine (XYLOCAINE) 2 % EX Gel Apply topically as needed   ? lidocaine-prilocaine (EMLA) 2.5-2.5 % EX Cream Apply to feet once a day   ? losartan-hydroCHLOROthiazide (HYZAAR) 100-25 MG PO Tablet Take 50/12.5 mg (1/2 tablet) by mouth 2 times daily.   ? metFORMIN (GLUCOPHAGE) 1000 MG PO Tablet Take 1 tablet by mouth 2 times daily (with meals). SrCr: 1.60* mg/dl (96/04 5409) [last value]   ? metoprolol succinate (TOPROL-XL) 25 MG PO Tablet Extended Release 24 Hour TAKE ONE TABLET BY MOUTH TWICE DAILY   ? omeprazole (PriLOSEC) 40 MG Capsule Delayed Release Take 1 capsule by mouth daily.   ? sildenafil (VIAGRA) 100 MG PO Tablet Take 1 tablet by mouth as needed for erectile dysfunction.   ? tamsulosin (FLOMAX) 0.4 MG PO Capsule Take 1 capsule by mouth daily.     No current facility-administered medications on file prior to visit.      Allergies   Allergen Reactions   ? Nsaids Other (See Comments)     ?Azotemia         Review of Systems  Review of Systems   All other systems reviewed and are negative.          Objective:        VITAL SIGNS (all recorded)      Clinic Vitals     Row Name 04/07/17 1347                Amb Encounter Vitals    Weight 85.7 kg (189 lb)    -FC at 04/07/17 1347       Height 1.798 m (5' 10.8")

## 2017-05-02 DIAGNOSIS — E1142 Type 2 diabetes mellitus with diabetic polyneuropathy: Principal | ICD-10-CM

## 2017-05-02 DIAGNOSIS — G609 Hereditary and idiopathic neuropathy, unspecified: Secondary | ICD-10-CM

## 2017-05-05 ENCOUNTER — Ambulatory Visit: Attending: Family Medicine | Primary: Family Medicine

## 2017-05-05 DIAGNOSIS — E119 Type 2 diabetes mellitus without complications: Principal | ICD-10-CM

## 2017-05-05 DIAGNOSIS — I2510 Atherosclerotic heart disease of native coronary artery without angina pectoris: Secondary | ICD-10-CM

## 2017-05-05 DIAGNOSIS — C801 Malignant (primary) neoplasm, unspecified: Secondary | ICD-10-CM

## 2017-05-05 DIAGNOSIS — I10 Essential (primary) hypertension: Secondary | ICD-10-CM

## 2017-05-05 DIAGNOSIS — N189 Chronic kidney disease, unspecified: Secondary | ICD-10-CM

## 2017-05-05 DIAGNOSIS — Z9641 Presence of insulin pump (external) (internal): Secondary | ICD-10-CM

## 2017-05-05 DIAGNOSIS — N400 Enlarged prostate without lower urinary tract symptoms: Secondary | ICD-10-CM

## 2017-05-05 DIAGNOSIS — E1151 Type 2 diabetes mellitus with diabetic peripheral angiopathy without gangrene: Secondary | ICD-10-CM

## 2017-05-05 DIAGNOSIS — I5189 Other ill-defined heart diseases: Secondary | ICD-10-CM

## 2017-05-05 DIAGNOSIS — M542 Cervicalgia: Secondary | ICD-10-CM

## 2017-05-05 DIAGNOSIS — Z85820 Personal history of malignant melanoma of skin: Secondary | ICD-10-CM

## 2017-05-05 DIAGNOSIS — Z125 Encounter for screening for malignant neoplasm of prostate: Secondary | ICD-10-CM

## 2017-05-05 DIAGNOSIS — N432 Other hydrocele: Secondary | ICD-10-CM

## 2017-05-05 DIAGNOSIS — I25810 Atherosclerosis of coronary artery bypass graft(s) without angina pectoris: Secondary | ICD-10-CM

## 2017-05-05 DIAGNOSIS — Z951 Presence of aortocoronary bypass graft: Secondary | ICD-10-CM

## 2017-05-05 DIAGNOSIS — M4802 Spinal stenosis, cervical region: Secondary | ICD-10-CM

## 2017-05-05 DIAGNOSIS — Z6827 Body mass index (BMI) 27.0-27.9, adult: Principal | ICD-10-CM

## 2017-05-05 DIAGNOSIS — N183 Chronic kidney disease, stage 3 (moderate): Secondary | ICD-10-CM

## 2017-05-05 DIAGNOSIS — R05 Cough: Secondary | ICD-10-CM

## 2017-05-05 DIAGNOSIS — Z981 Arthrodesis status: Secondary | ICD-10-CM

## 2017-05-05 MED ORDER — HYDROCODONE-ACETAMINOPHEN 5-325 MG PO TABS
1 | ORAL_TABLET | Freq: Four times a day (QID) | ORAL | 0 refills | Status: CP | PRN
Start: 2017-05-05 — End: 2017-06-02

## 2017-05-05 MED ORDER — FUROSEMIDE 20 MG PO TABS
20 mg | Freq: Every day | ORAL | 0 refills | Status: CP
Start: 2017-05-05 — End: 2017-05-13

## 2017-05-05 MED ORDER — IPRATROPIUM BROMIDE 0.06 % NA SOLN
6 refills | Status: CP
Start: 2017-05-05 — End: 2017-05-13

## 2017-05-05 NOTE — Progress Notes
Subjective:   Gary Walters is a 71 y.o. male being seen today for Medications Refill       HPI Patient is here for follow up of chronic medical conditions and is currently using the medications listed below.  The current medical conditions are mostly stable.     Please see the Review of symtoms below.  H/o cad post cabg pvd dyastolic dysfunciotn  Dm htn  All cond unchanged   no new cp sob abpan  Few days of noticed nontender asymptomatic swelling of his l scrotum  No signs of fluid overload otherwise  Also cough off and on dry would like a cxr  Needs rf meds      Past Medical History:   Diagnosis Date   ? CAD (coronary artery disease)    ? Cancer (CMS-HCC code)    ? CKD (chronic kidney disease) 08/20/2014   ? Diabetes mellitus (CMS-HCC code)    ? Hypertension      Past Surgical History:   Procedure Laterality Date   ? CARDIAC CATHERIZATION     ? CERVICAL FUSION     ? CORONARY ANGIOPLASTY     ? CORONARY ARTERY BYPASS GRAFT      3 vessel     Family History   Problem Relation Age of Onset   ? Cancer Mother    ? Diabetes Father    ? Blindness Father    ? Cancer Sister    ? Cancer Brother      Social History     Social History   ? Marital status: Married     Spouse name: N/A   ? Number of children: N/A   ? Years of education: N/A     Occupational History   ? Not on file.     Social History Main Topics   ? Smoking status: Never Smoker   ? Smokeless tobacco: Never Used   ? Alcohol use No   ? Drug use: No   ? Sexual activity: Yes     Partners: Female     Other Topics Concern   ? Not on file     Social History Narrative   ? No narrative on file     Current Outpatient Prescriptions on File Prior to Visit   Medication Sig   ? amLODIPine (NORVASC) 10 MG PO Tablet Take 5 mg (1/2 tablet) by mouth 2 times daily.   ? aspirin 81 MG PO Tablet Chewable Chew 1 tablet daily.   ? atorvastatin (LIPITOR) 40 MG Tablet TAKE ONE TABLET BY MOUTH ONCE DAILY   ? Bromfenac Sodium (PROLENSA) 0.07 % OP Solution Prolensa 0.07 % eye drops

## 2017-05-05 NOTE — Progress Notes
?   cetirizine (ZyrTEC) 10 MG Tablet Take 5 mg (1/2 tablet) by mouth daily.   ? cloNIDine (CATAPRES) 0.1 MG PO Tablet Take 1 tablet by mouth 2 times daily as needed for severe hypertension.   ? clopidogrel (PLAVIX) 75 MG PO Tablet TAKE ONE TABLET BY MOUTH ONCE DAILY   ? dapagliflozin (FARXIGA) 5 MG PO Tablet Take 1 tablet by mouth every morning.   ? diclofenac (VOLTAREN GEL) 1 % TD Gel Apply 2 g topically 4 times daily. **Measure dose using enclosed dose card**   ? gabapentin (NEURONTIN) 600 MG PO Tablet Take 1 tablet by mouth 3 times daily.   ? [DISCONTINUED] HYDROcodone-acetaminophen (NORCO) 5-325 MG PO Tablet Take 1 tablet by mouth every 6 hours as needed for pain. Earliest Fill Date: 04/07/17   ? insulin aspart (NovoLOG) vial 100 units daily via insulin pump   ? [DISCONTINUED] ipratropium (ATROVENT) 0.06 % NA Solution ipratropium bromide 42 mcg (0.06 %) nasal spray   ? lidocaine (XYLOCAINE) 2 % EX Gel Apply topically as needed   ? lidocaine-prilocaine (EMLA) 2.5-2.5 % EX Cream Apply to feet once a day   ? losartan-hydroCHLOROthiazide (HYZAAR) 100-25 MG PO Tablet Take 50/12.5 mg (1/2 tablet) by mouth 2 times daily.   ? metFORMIN (GLUCOPHAGE) 1000 MG PO Tablet Take 1 tablet by mouth 2 times daily (with meals). SrCr: 1.60* mg/dl (16/10 9604) [last value]   ? metoprolol succinate (TOPROL-XL) 25 MG PO Tablet Extended Release 24 Hour TAKE ONE TABLET BY MOUTH TWICE DAILY   ? mupirocin (BACTROBAN) 2 % EX Cream Bactroban 2 % topical cream   APPLY A SMALL AMOUNT TO THE AFFECTED AREA BY TOPICAL ROUTE 3 TIMES PER DAY FOR 10 DAYS   ? omeprazole (PriLOSEC) 40 MG Capsule Delayed Release Take 1 capsule by mouth daily.   ? ONE TOUCH ULTRA TEST VI Strip 2 times daily (before breakfast and dinner).   ? sertraline (ZOLOFT) 50 MG PO Tablet Take 1 tablet by mouth daily.   ? sildenafil (VIAGRA) 100 MG PO Tablet Take 1 tablet by mouth as needed for erectile dysfunction.

## 2017-05-05 NOTE — Progress Notes
?   tamsulosin (FLOMAX) 0.4 MG PO Capsule Take 1 capsule by mouth daily.     No current facility-administered medications on file prior to visit.      Allergies   Allergen Reactions   ? Nsaids Other (See Comments)     ?Azotemia         Review of Systems  Review of Systems   All other systems reviewed and are negative.          Objective:        VITAL SIGNS (all recorded)      Clinic Vitals     Row Name 05/05/17 1142                Amb Encounter Vitals    Weight 86 kg (189 lb 8 oz)    -SA at 05/05/17 1143       Height 1.778 m ( )    -SA at 05/05/17 1143       BMI (Calculated) 27.25    -SA at 05/05/17 1143       BSA (Calculated - sq m) 2.06    -SA at 05/05/17 1143       BP 137/86    -SA at 05/05/17 1145       BP Location Right upper arm    -SA at 05/05/17 1145       Position Sitting    -SA at 05/05/17 1145       Pulse 66    -SA at 05/05/17 1145       Pulse Quality Normal    -SA at 05/05/17 1145       Resp 17    -SA at 05/05/17 1145       Respiration Quality Normal    -SA at 05/05/17 1145       Temp 36.1 ?C (96.9 ?F)    -SA at 05/05/17 1145       Temperature Source Oral    -SA at 05/05/17 1145       Pain Score Five    -SA at 05/05/17 1145       Location neck and back     -SA at 05/05/17 1145          Education/Communication Barriers?    Learning/Communication Barriers? No    -SA at 05/05/17 1145          Fall Risk Assessment    Had recent fall / Last 6 months? No recent fall    -SA at 05/05/17 1145       Does patient have a fear of falling? No    -SA at 05/05/17 1145         User Key  (r) = Recorded By, (t) = Taken By, (c) = Cosigned By    Initials Name Effective Dates    SA Mason Jim, MA 10/28/15 -         Physical Exam   Constitutional: He appears well-developed and well-nourished. No distress.   HENT:   Head: Normocephalic.   Eyes: Pupils are equal, round, and reactive to light.   Cardiovascular: Normal rate, regular rhythm and normal heart sounds.  Exam reveals no gallop and no friction rub.

## 2017-05-05 NOTE — Addendum Note
Addended by: Hassell Halim on: 05/05/2017 01:25 PM     Modules accepted: Orders

## 2017-05-05 NOTE — Progress Notes
SHINGRIX 10/18A    CBC5/18  CMP  LIPIDS  TSH  B12 1/16  VIT D  URINE DIP    HBA1C 5/16;9/17;3/18  FEET 5/16 podiatry fu  OPHTH10/16r;7/17  URINE MICROALB endo;3/18     EKG 4/16  ECHO 2016  STRESS TEST p    SEG DOPPLER 8/16    CARDIO FU  ENDOCRINOL FU DR CHEHADE -  PAIN MNG FU  NUTRITIONIST 8/16R  VASCULAR FU  DERMATOLOGY FU    PAIN CONTRACT  URINE DOA    PAIN CONTROL IS STABLE WITH OVERALL ACCEPTABLE FUNCTIONALITY.  EFORCE REVIEWED WITH EVERY CONTROLLED SUBSTANCE REFILL  RISKS OF MEDICATIONS  AND USE WERE DISCUSSED WITH PATIENT

## 2017-05-05 NOTE — Progress Notes
No murmur heard.  Pulmonary/Chest: Effort normal and breath sounds normal. No respiratory distress. He has no wheezes. He has no rales.   Abdominal: Soft. There is no tenderness.   Genitourinary: Penis normal.   Genitourinary Comments: L srtotum mild tension due to fluid presence  No pain no hernia appreciated  Testicle nontender nl in size no mass  No skin discoloration  No pain with elevation        Assessment:       ICD-10-CM ICD-9-CM    1. BMI 27.0-27.9,adult Z68.27 V85.23    2. Cervicalgia M54.2 723.1 HYDROcodone-acetaminophen (NORCO) 5-325 MG PO Tablet      ipratropium (ATROVENT) 0.06 % NA Solution   3. Other hydrocele N43.2 603.8 furosemide (LASIX) 20 MG PO Tablet   4. Benign prostatic hyperplasia, unspecified whether lower urinary tract symptoms present N40.0 600.00    5. Coronary artery disease involving coronary bypass graft of native heart without angina pectoris I25.810 414.05    6. Cervical spinal stenosis M48.02 723.0    7. Stage 3 chronic kidney disease (CMS-HCC code) N18.3 585.3    8. Diabetes mellitus with peripheral vascular disease (CMS-HCC code) E11.51 250.70      443.81    9. Diastolic dysfunction I51.9 429.9    10. History of 3V CABG in 2015 Z95.1 V45.81    11. History of cutaneous melanoma Z85.820 V10.82    12. Labile essential hypertension I10 401.9    13. Status post cervical spinal fusion Z98.1 V45.4    14. Status post insulin pump placement Z96.41 V45.85           Plan:   Patient advised to various Tx options and agreed to the following:  Continuation of current medications listed above,the importance of follow up visits.    Risk verses benefits of medication and treatments were discussed.  Patient voiced understanding.       rev notes   rev labs   psa hba1c cmp  Urine microalb  Fu endocrinol  Fu ophtalm   Adv shingrix  Rx lasix 20-40 mg a day for few days to see if helps hydrocele  May take dietary potassium as discussed  Rtc if not resolbed and will refer to scrotal US

## 2017-05-05 NOTE — Progress Notes
Ref cxr per pt request due to cough off and on  Rf meds   PAIN CONTROL IS STABLE WITH OVERALL ACCEPTABLE FUNCTIONALITY.  EFORCE REVIEWED WITH EVERY CONTROLLED SUBSTANCE REFILL  RISKS OF MEDICATIONS  AND USE WERE DISCUSSED WITH PATIENT  Fu specialties    Orders Placed This Encounter   Medications   ? HYDROcodone-acetaminophen (NORCO) 5-325 MG PO Tablet     Sig: Take 1 tablet by mouth every 6 hours as needed for pain. Earliest Fill Date: 05/05/17     Dispense:  120 tablet     Refill:  0     Order Specific Question:   Please specify condition:     Answer:   Non acute pain (30 day maximum)     Order Specific Question:   Prior to prescribing this controlled substance, has the [place] PDMP website been accessed by yourself or designated staff?     Answer:   Yes, The PDMP website has been accessed   ? ipratropium (ATROVENT) 0.06 % NA Solution     Sig: ipratropium bromide 42 mcg (0.06 %) nasal spray     Dispense:  15 mL     Refill:  6   ? furosemide (LASIX) 20 MG PO Tablet     Sig: Take 1 tablet by mouth daily.     Dispense:  20 tablet     Refill:  0     No orders of the following type(s) were placed in this encounter: Procedures      Health Maintenance was reviewed. The patient's HM Topic list was:                                            Health Maintenance   Topic Date Due   ? USPSTF Hepatitis C Screening  [birth year]   ? Zoster Vaccine (1 of 2) 03/07/1997   ? Diabetic Eye Exam  02/10/2017   ? Hemoglobin A1C  05/07/2017   ? Influenza Vaccine (1) 04/30/2018 (Originally 03/26/2017)   ? Preventive Wellness Visit  07/05/2017   ? Lipid Profile  10/05/2017   ? Urine Microalbumin  10/05/2017   ? Diabetic Foot Exam  10/26/2017   ? Creatinine  02/04/2018   ? Basic Metabolic Panel  02/04/2018   ? Colon Cancer Screening  11/08/2022   ? DTaP,Tdap,and Td Vaccines (2 - Td) 01/17/2024   ? Pneumovax / Prevnar  Completed       HM    PSA1/16;10/18  PROSTATE  COLONOSC    ADV DIRY    FLU10/16;11/17d  PRENV 6/16  PNEUMOV 8/43F  ZOSTAV 6/16r

## 2017-05-05 NOTE — Progress Notes
Reason:  Physician ordered labs  Amount:  3 Tubes  Type:  Butterfly  Site:  Vein  right arm  Reaction:  None    Draw performed by:  Hans Eden,  05/05/2017

## 2017-05-09 ENCOUNTER — Inpatient Hospital Stay: Admit: 2017-05-09 | Discharge: 2017-05-10 | Primary: Family Medicine

## 2017-05-09 DIAGNOSIS — Z8781 Personal history of (healed) traumatic fracture: Secondary | ICD-10-CM

## 2017-05-09 DIAGNOSIS — R05 Cough: Principal | ICD-10-CM

## 2017-05-10 DIAGNOSIS — N432 Other hydrocele: Secondary | ICD-10-CM

## 2017-05-10 DIAGNOSIS — M542 Cervicalgia: Principal | ICD-10-CM

## 2017-05-11 ENCOUNTER — Ambulatory Visit: Attending: "Endocrinology | Primary: Family Medicine

## 2017-05-11 DIAGNOSIS — E1142 Type 2 diabetes mellitus with diabetic polyneuropathy: Principal | ICD-10-CM

## 2017-05-11 DIAGNOSIS — N183 Chronic kidney disease, stage 3 (moderate): Secondary | ICD-10-CM

## 2017-05-11 DIAGNOSIS — I10 Essential (primary) hypertension: Secondary | ICD-10-CM

## 2017-05-11 DIAGNOSIS — C801 Malignant (primary) neoplasm, unspecified: Secondary | ICD-10-CM

## 2017-05-11 DIAGNOSIS — N189 Chronic kidney disease, unspecified: Secondary | ICD-10-CM

## 2017-05-11 DIAGNOSIS — E7849 Other hyperlipidemia: Secondary | ICD-10-CM

## 2017-05-11 DIAGNOSIS — G609 Hereditary and idiopathic neuropathy, unspecified: Secondary | ICD-10-CM

## 2017-05-11 DIAGNOSIS — I2510 Atherosclerotic heart disease of native coronary artery without angina pectoris: Secondary | ICD-10-CM

## 2017-05-11 DIAGNOSIS — Z794 Long term (current) use of insulin: Secondary | ICD-10-CM

## 2017-05-11 DIAGNOSIS — I25810 Atherosclerosis of coronary artery bypass graft(s) without angina pectoris: Secondary | ICD-10-CM

## 2017-05-11 DIAGNOSIS — E119 Type 2 diabetes mellitus without complications: Principal | ICD-10-CM

## 2017-05-11 NOTE — Progress Notes
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 Resident/Fellow 11/15/2016                      Value:This result contains rich text formatting which cannot be displayed here.   ? Glucose (Meter) 11/15/2016 169*   ? Glucose (Meter) 11/15/2016 137*   ? Glucose (Meter) 11/15/2016 239*   ? WBC 11/16/2016 6.00    ? RBC 11/16/2016 3.45*   ? Hemoglobin 11/16/2016 11.6*   ? Hematocrit 11/16/2016 34.2*   ? MCV 11/16/2016 99.1    ? [facility] 11/16/2016 33.6    ? MCHC 11/16/2016 33.9    ? RDW 11/16/2016 12.8    ? Platelet Count 11/16/2016 249    ? MPV 11/16/2016 9.8    ? nRBC % 11/16/2016 0.0    ? Absolute NRBC Count 11/16/2016 0.00    ? Sodium 11/16/2016 135    ? Potassium 11/16/2016 4.8*   ? Chloride 11/16/2016 103    ? CO2 11/16/2016 19*   ? Urea Nitrogen 11/16/2016 36*   ? Creatinine 11/16/2016 2.14*   ? BUN/Creatinine Ratio 11/16/2016 16.8    ? Glucose 11/16/2016 133*   ? Calcium 11/16/2016 8.0*   ? Osmolality Calc 11/16/2016 280.3    ? Anion Gap 11/16/2016 13    ? EGFR 11/16/2016 31    Hospital Outpatient Visit on 11/12/2016   Component Date Value   ? Ventricular Rate 11/12/2016 61    ? Atrial Rate 11/12/2016 61    ? P-R Interval 11/12/2016 154    ? QRS Duration 11/12/2016 86    ? Q-T Interval 11/12/2016 410    ? QTC Calculation (Bezet) 11/12/2016 412    ? Calculated P Axis 11/12/2016 62    ? Calculated R Axis 11/12/2016 -9    ? Calculated T Axis 11/12/2016 67    ? Sodium 11/12/2016 137    ? Potassium 11/12/2016 5.5*   ? Chloride 11/12/2016 103    ? CO2 11/12/2016 22    ? Urea Nitrogen 11/12/2016 31*   ? Creatinine 11/12/2016 2.06*

## 2017-05-11 NOTE — Progress Notes
?   BUN/Creatinine Ratio 11/12/2016 15.0    ? Glucose 11/12/2016 113*   ? Calcium 11/12/2016 9.4    ? Osmolality Calc 11/12/2016 281.2    ? Anion Gap 11/12/2016 12    ? EGFR 11/12/2016 32    ? WBC 11/12/2016 5.42    ? RBC 11/12/2016 4.08*   ? Hemoglobin 11/12/2016 13.5*   ? Hematocrit 11/12/2016 40.3    ? MCV 11/12/2016 98.8    ? [facility] 11/12/2016 33.1    ? MCHC 11/12/2016 33.5    ? RDW 11/12/2016 12.6    ? Platelet Count 11/12/2016 300    ? MPV 11/12/2016 9.9    ? nRBC % 11/12/2016 0.0    ? Absolute NRBC Count 11/12/2016 0.00    ? Neutrophils % 11/12/2016 61.1    ? Lymphocytes % 11/12/2016 19.4*   ? Monocytes % 11/12/2016 16.4*   ? Eosinophils % 11/12/2016 2.0    ? Immature Granulocytes % 11/12/2016 0.4    ? Neutrophils Absolute 11/12/2016 3.31    ? Lymphocytes Absolute 11/12/2016 1.05    ? Monocytes Absolute 11/12/2016 0.89    ? Eosinophils Absolute 11/12/2016 0.11    ? Basophil Absolute 11/12/2016 0.04    ? Basophils % 11/12/2016 0.7    ? Protime 11/12/2016 13.8    ? INR 11/12/2016 1.1    ? PTT 11/12/2016 29          Personal Hx   Denied Alcohol Use  Never A Smoker    ROS   Constitution: weight sta up and down few  Lbs       no  fever chills or night sweat. Energy level good    Eyes:  Denies   vision problem.  Cardiovascular:  Denies any acute chest pain at rest or with exertion.  No lower extremities edema. No shortness of breath with minimal or moderate exertion.  Respiratory:  No  coughing hemoptysis or wheezing.  Gastrointestinal: No history of nausea, vomiting,  yes heartburn,  no diarrhea, constipation, or abdominal pain. + gerd   Neurological: No  memory problem + +   feet numbness and  burning sensation.   Musculoskeletal: No muscles aching or joints aching.  Psychiatric:  No sign of depression, no  irritability or nervousness.  Endocrine: (as reviewed in HPI).  No heat or cold intolerance.  Genitourinary:  No dysuria or hematuria + nocturia   Hematologic:  No easy bruising.    Vital Signs

## 2017-05-11 NOTE — Progress Notes
1-DM type 2, uncontrolled  2-ED  3-Hypertension  4-Hyperlipidemia.    [facility]   Neck Surgery.  cabg     Family Hx   Paternal history of Diabetes Mellitus  Paternal history of Essential Hypertension  Family history of thyroid cancer?  no  Family history of thyroid disease? no     Office Visit on 05/05/2017   Component Date Value   ? WHITE BLOOD CELL COUNT 05/05/2017 6.0    ? RBC 05/05/2017 4.41    ? Hemoglobin 05/05/2017 14.8    ? Hematocrit 05/05/2017 43.0    ? MCV 05/05/2017 97.5    ? [facility] 05/05/2017 33.6*   ? MCHC 05/05/2017 34.4    ? RDW 05/05/2017 12.4    ? Platelets 05/05/2017 218    ? MPV 05/05/2017 10.7    ? Glucose 05/05/2017 151*   ? Urea Nitrogen 05/05/2017 34*   ? Creatinine 05/05/2017 1.71*   ? EGFR 05/05/2017 40*   ? Glom Filt Rate, Est Afri* 05/05/2017 46*   ? BUN/Creatinine Ratio 05/05/2017 20    ? Sodium 05/05/2017 137    ? Potassium 05/05/2017 4.7    ? Chloride 05/05/2017 104    ? CARBON DIOXIDE 05/05/2017 22    ? Calcium 05/05/2017 9.5    ? Protein, Total 05/05/2017 6.8    ? ALBUMIN 05/05/2017 4.2    ? Globulin 05/05/2017 2.6    ? ALBUMIN/GLOBULIN RATIO 05/05/2017 1.6    ? Total Bilirubin 05/05/2017 0.8    ? Alkaline Phosphatase 05/05/2017 75    ? AST 05/05/2017 21    ? ALT 05/05/2017 20    ? Hemoglobin A1C 05/05/2017 9.2*   ? EAG (MG/DL) 05/05/2017 217    ? EAG (MMOL/L) 05/05/2017 12.0    ? Cholesterol, Total 05/05/2017 123    ? HDL 05/05/2017 50    ? Triglycerides 05/05/2017 70    ? LDL Cholesterol 05/05/2017 58    ? LDl/HDL Ratio 05/05/2017 2.5    ? NON-HDL CHOLESTEROL 05/05/2017 73    ? TSH, 3RD GENERATION 05/05/2017 1.27    ? PSA, TOTAL 05/05/2017 0.7    ? MICROALBUMIN 05/05/2017 0.9    ? RAM 05/05/2017     Office Visit on 01/19/2017   Component Date Value   ? Hemoglobin A1C, POC 01/19/2017 7.7    Office Visit on 11/29/2016   Component Date Value   ? Hemoglobin A1C 02/04/2017 8.2*   ? EAG (MG/DL) 02/04/2017 189    ? EAG (MMOL/L) 02/04/2017 10.4    ? WHITE BLOOD CELL COUNT 02/04/2017 5.8

## 2017-05-11 NOTE — Progress Notes
gabapentin (NEURONTIN) 600 MG PO Tablet Take 1 tablet by mouth 3 times daily. 02/09/17   Harlin Rainhehade, Joe M, MD   HYDROcodone-acetaminophen (NORCO) 5-325 MG PO Tablet Take 1 tablet by mouth every 6 hours as needed for pain. Earliest Fill Date: 05/05/17 05/05/17   Gearlean AlfHadzic, [name], MD   insulin aspart (NovoLOG) vial 100 units daily via insulin pump 11/29/16   Harlin Rainhehade, Joe M, MD   ipratropium (ATROVENT) 0.06 % NA Solution ipratropium bromide 42 mcg (0.06 %) nasal spray 05/05/17   [name], [name], MD   lidocaine (XYLOCAINE) 2 % EX Gel Apply topically as needed 11/01/16   Payton DoughtyHarris, Richard C III, DPM   lidocaine-prilocaine (EMLA) 2.5-2.5 % EX Cream Apply to feet once a day 01/12/17   Musselman, Patterson Hammersmitheddy Manning, DPM   losartan-hydroCHLOROthiazide (HYZAAR) 100-25 MG PO Tablet Take 50/12.5 mg (1/2 tablet) by mouth 2 times daily. 08/02/16   Gearlean AlfHadzic, [name], MD   metFORMIN (GLUCOPHAGE) 1000 MG PO Tablet Take 1 tablet by mouth 2 times daily (with meals). SrCr: 1.60* mg/dl (16/1003/13 96041225) [last value] 11/03/16   Harlin Rainhehade, Joe M, MD   metoprolol succinate (TOPROL-XL) 25 MG PO Tablet Extended Release 24 Hour TAKE ONE TABLET BY MOUTH TWICE DAILY 12/14/16   [name], [name], MD   mupirocin (BACTROBAN) 2 % EX Cream Bactroban 2 % topical cream   APPLY A SMALL AMOUNT TO THE AFFECTED AREA BY TOPICAL ROUTE 3 TIMES PER DAY FOR 10 DAYS    Information, Historical   omeprazole (PriLOSEC) 40 MG Capsule Delayed Release Take 1 capsule by mouth daily. 10/04/14   Schuyler AmorLowe, Allison, ARNP   ONE TOUCH ULTRA TEST VI Strip 2 times daily (before breakfast and dinner). 04/07/17   [name], [name], MD   sertraline (ZOLOFT) 50 MG PO Tablet Take 1 tablet by mouth daily. 04/07/17   Gearlean AlfHadzic, [name], MD   sildenafil (VIAGRA) 100 MG PO Tablet Take 1 tablet by mouth as needed for erectile dysfunction. 03/17/17   Gearlean AlfHadzic, [name], MD   tamsulosin (FLOMAX) 0.4 MG PO Capsule Take 1 capsule by mouth daily. 08/02/16   Gearlean AlfHadzic, [name], MD     Allergies   No Known Drug Allergies.    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

## 2017-05-11 NOTE — Progress Notes
?   RBC 02/04/2017 3.98*   ? Hemoglobin 02/04/2017 13.6    ? Hematocrit 02/04/2017 39.2    ? MCV 02/04/2017 98.5    ? [facility] 02/04/2017 34.2*   ? MCHC 02/04/2017 34.7    ? RDW 02/04/2017 12.1    ? Platelets 02/04/2017 187    ? MPV 02/04/2017 10.9    ? Neutrophils Absolute 02/04/2017 3822    ? Lymphocytes Absolute 02/04/2017 1218    ? Monocytes Absolute 02/04/2017 609    ? Eosinophils Absolute 02/04/2017 122    ? Basophils Absolute 02/04/2017 29    ? Neutrophils 02/04/2017 65.9    ? LYMPHOCYTES 02/04/2017 21.0    ? MONOCYTES 02/04/2017 10.5    ? EOSINOPHILS 02/04/2017 2.1    ? BASOPHILS 02/04/2017 0.5    ? Glucose 02/04/2017 258*   ? Urea Nitrogen 02/04/2017 35*   ? Creatinine 02/04/2017 1.49*   ? EGFR 02/04/2017 47*   ? Glom Filt Rate, Est Afri* 02/04/2017 55*   ? BUN/Creatinine Ratio 02/04/2017 23*   ? Sodium 02/04/2017 138    ? Potassium 02/04/2017 4.6    ? Chloride 02/04/2017 105    ? CARBON DIOXIDE 02/04/2017 25    ? Calcium 02/04/2017 8.7    Admission on 11/15/2016, Discharged on 11/16/2016   Component Date Value   ? Glucose (Meter) 11/15/2016 165*   ? Clinical History 11/15/2016                      Value:This result contains rich text formatting which cannot be displayed here.   ? Final Diagnosis 11/15/2016                      Value:This result contains rich text formatting which cannot be displayed here.   ? Attestation Statement 11/15/2016                      Value:This result contains rich text formatting which cannot be displayed here.   ? Gross Description 11/15/2016                      Value:This result contains rich text formatting which cannot be displayed here.   ? Case Report 11/15/2016                      Value:Surgical Pathology Report                         Case: [id number]                                  Authorizing Provider:  [name],    Collected:           11/15/2016 1241                                     DPM

## 2017-05-11 NOTE — Progress Notes
Chief Complaint   ? [name] is a 71   year old male here today for follow-up on type 2  diabetes      HPI   Patient here for DM follow up. Uses insulin pump. Took a shower before the appt and forgot to put his pump back on so did not bring his insulin pump to the appt today    History of Diabetes:   Type 2 DM    insulin regimen :  Basal rate 0.8  midnight till 7 am then   Basal 1   Carb ratio 1/15    1 for every 50 > 100     METFORMIN 1000 only one still instructed       Accuchecksbetter       Hypoglycemia requiring assistance no   Changing site every 3-4 days     A1c.  8.2     Creat   1.49      On atorvastatin. LDL 53  Neuropathy - on gabapentin   Due for eye exam   HTN - on norvasc, metoprolol and losartan   .  Current Meds   Current Outpatient Medications    Medication Sig Start Date End Date Taking? Authorizing Provider   amLODIPine (NORVASC) 10 MG PO Tablet Take 5 mg (1/2 tablet) by mouth 2 times daily. 10/10/16   Malcolm MetroSuryadevara, Siva K, MD   aspirin 81 MG PO Tablet Chewable Chew 1 tablet daily. 11/17/16   Candie ChromanIbrahim, Saif, MD   atorvastatin (LIPITOR) 40 MG Tablet TAKE ONE TABLET BY MOUTH ONCE DAILY 06/02/16   Gearlean AlfHadzic, [name], MD   Bromfenac Sodium (PROLENSA) 0.07 % OP Solution Prolensa 0.07 % eye drops    Information, Historical   cetirizine (ZyrTEC) 10 MG Tablet Take 5 mg (1/2 tablet) by mouth daily. 10/17/15   Malcolm MetroSuryadevara, Siva K, MD   cloNIDine (CATAPRES) 0.1 MG PO Tablet Take 1 tablet by mouth 2 times daily as needed for severe hypertension. 10/13/16   Malcolm MetroSuryadevara, Siva K, MD   clopidogrel (PLAVIX) 75 MG PO Tablet TAKE ONE TABLET BY MOUTH ONCE DAILY 07/29/16   [name], [name], MD   dapagliflozin (FARXIGA) 5 MG PO Tablet Take 1 tablet by mouth every morning. 02/09/17   Harlin Rainhehade, Joe M, MD   diclofenac (VOLTAREN GEL) 1 % TD Gel Apply 2 g topically 4 times daily. **Measure dose using enclosed dose card** 08/02/16   [name], [name], MD   furosemide (LASIX) 20 MG PO Tablet Take 1 tablet by mouth daily. 05/05/17   Gearlean AlfHadzic, [name], MD

## 2017-05-11 NOTE — Progress Notes
There were no vitals filed for this visit.  Physical Exam   Constitution:   1-Vital sign as reviewed in the chart   2-Appearance: Well nourished, Normocephalic.   Cardiovascular:   1-Heart auscultation: regular rhythm no appreciate murmur.   3-Lower extremities: no edema   Respiratory:   1-Respiratory effort: No labored breathing.   2-Auscultation: lungs are clear to auscultation bilaterally;no wheezing, rhonchi or rales.  Musculosketetal:   1-Gait & Station: Normal gait and station, gait is smooth and coordinated.   Psychiatric:   1-Orientation: Patient appears well oriented to place, person and time.   2-Mood & affect: Patient appears in normal mood & affect, no agitation, anxiety or blunt affect.    Assessment   1-DM type 2, uncontrolled  2-Hypertension  3-Hyperlipidemia  4 neuropathy.      Plan   Results for Gary Walters, Gary Walters (MRN [id number]) as of 05/11/2017 13:03   Ref. Range 05/05/2017 12:24   EAG (MMOL/L) Latest Units: (calc) 12.0   est AVG Glucose Latest Units: (calc) 217   HGB A1C % Latest Ref Range: <5.7 % of total Hgb 9.2 (H)   TSH, 3RD GENERATION Latest Ref Range: 0.40 - 4.50 mIU/L 1.27       accuchecks  Better     Downloaded pump and reviewed data   Cont   Basal rate 0.8  midnight till 7 am then   Basal 1   Carb ratio 1/12    1 for ecvery 50 > 100   Metformin 1000 bid   Add jardiance not covered  [facility] farxiga and [facility]okana covered   A trial of farxiga low dose gfr borderline    No orders of the defined types were placed in this encounter.    No new medicines prescribed in this encounter.    Cont cephalexin till he sees podiatry today    Hypertension BP ok       F/u 2  months

## 2017-05-13 MED ORDER — IPRATROPIUM BROMIDE 0.06 % NA SOLN
6 refills | Status: CP
Start: 2017-05-13 — End: ?

## 2017-05-13 MED ORDER — FUROSEMIDE 20 MG PO TABS
20 mg | Freq: Every day | ORAL | 3 refills | Status: CP
Start: 2017-05-13 — End: 2017-05-24

## 2017-05-13 NOTE — Telephone Encounter
Spoke with pt and he states he will need this medication sent to the pharmacy.

## 2017-05-24 DIAGNOSIS — N433 Hydrocele, unspecified: Secondary | ICD-10-CM

## 2017-05-24 DIAGNOSIS — N432 Other hydrocele: Principal | ICD-10-CM

## 2017-05-24 MED ORDER — FUROSEMIDE 20 MG PO TABS
20 mg | Freq: Every day | ORAL | 3 refills | Status: CP
Start: 2017-05-24 — End: ?

## 2017-05-30 ENCOUNTER — Encounter: Attending: "Endocrinology | Primary: Family Medicine

## 2017-05-30 DIAGNOSIS — Z794 Long term (current) use of insulin: Secondary | ICD-10-CM

## 2017-05-30 DIAGNOSIS — E1142 Type 2 diabetes mellitus with diabetic polyneuropathy: Secondary | ICD-10-CM

## 2017-05-30 DIAGNOSIS — 1 ERRONEOUS ENCOUNTER--DISREGARD: Secondary

## 2017-05-30 DIAGNOSIS — E11621 Type 2 diabetes mellitus with foot ulcer: Principal | ICD-10-CM

## 2017-05-30 DIAGNOSIS — L97502 Non-pressure chronic ulcer of other part of unspecified foot with fat layer exposed: Secondary | ICD-10-CM

## 2017-05-30 MED ORDER — CEPHALEXIN 500 MG PO CAPS
500 mg | Freq: Two times a day (BID) | ORAL | 0 refills | Status: CP
Start: 2017-05-30 — End: 2017-06-02

## 2017-05-31 ENCOUNTER — Ambulatory Visit: Attending: Urology | Primary: Family Medicine

## 2017-05-31 DIAGNOSIS — I2510 Atherosclerotic heart disease of native coronary artery without angina pectoris: Secondary | ICD-10-CM

## 2017-05-31 DIAGNOSIS — I10 Essential (primary) hypertension: Secondary | ICD-10-CM

## 2017-05-31 DIAGNOSIS — E119 Type 2 diabetes mellitus without complications: Principal | ICD-10-CM

## 2017-05-31 DIAGNOSIS — C801 Malignant (primary) neoplasm, unspecified: Secondary | ICD-10-CM

## 2017-05-31 DIAGNOSIS — N189 Chronic kidney disease, unspecified: Secondary | ICD-10-CM

## 2017-05-31 DIAGNOSIS — R1909 Other intra-abdominal and pelvic swelling, mass and lump: Secondary | ICD-10-CM

## 2017-05-31 DIAGNOSIS — N433 Hydrocele, unspecified: Principal | ICD-10-CM

## 2017-05-31 NOTE — Progress Notes
PSA, TOTAL Latest Ref Range: < OR = 4.0 ng/mL 0.7       ASSESSMENT    ICD-10-CM ICD-9-CM    1. Left hydrocele N43.3 603.9 Refer to Urology      CT Pelvis w Con   2. Mass of left inguinal region R19.09 789.39 CT Pelvis w Con         PLAN:   1. Discussed with patient the natural history of hydroceles. Not an emergent/urgent condition to treat and it is not life-threatening. It is not cancer. Option for treatment to include: do nothing, surgical correction. Unfortunately, patient is an uncontrolled diabetic and is not an surgical candidate at this time (A1c needs to be between 7-8%).  2. Since I also feel a possible left inguinal hernia, will get a CT Pelvis to confirm this diagnosis.  3. F/u via phone after CT scan is done [phone]

## 2017-05-31 NOTE — Progress Notes
Chief Complaint: Hydrocele    HPI: We appreciate the opportunity to meet and evaluate Gary Walters  As you are aware, he is a 71 y.o. Caucasian male who was referred for evaluation of left scrotal swelling.  Started about 4 weeks ago - noticed some left scrotal swelling. Mild discomfort. Concerned it is a cancer. No prior GU hx - no hx of prostate cancer, UTI, stones, or hematuria.     Past Medical History:    Past Medical History:   Diagnosis Date   ? CAD (coronary artery disease)    ? Cancer (CMS-HCC code)    ? CKD (chronic kidney disease) 08/20/2014   ? Diabetes mellitus (CMS-HCC code)    ? Hypertension        Past Surgical History:    Past Surgical History:   Procedure Laterality Date   ? CARDIAC CATHERIZATION     ? CERVICAL FUSION     ? CORONARY ANGIOPLASTY     ? CORONARY ARTERY BYPASS GRAFT      3 vessel       Allergies:    Allergies   Allergen Reactions   ? Nsaids Other (See Comments)     ?Azotemia       Current Meds:    Current Med List   Medication Sig   ? amLODIPine (NORVASC) 10 MG PO Tablet Take 5 mg (1/2 tablet) by mouth 2 times daily.   ? aspirin 81 MG PO Tablet Chewable Chew 1 tablet daily.   ? atorvastatin (LIPITOR) 40 MG Tablet TAKE ONE TABLET BY MOUTH ONCE DAILY   ? Bromfenac Sodium (PROLENSA) 0.07 % OP Solution Prolensa 0.07 % eye drops   ? cephALEXin (KEFLEX) 500 MG PO Capsule Take 1 capsule by mouth 2 times daily.   ? cetirizine (ZyrTEC) 10 MG Tablet Take 5 mg (1/2 tablet) by mouth daily.   ? cloNIDine (CATAPRES) 0.1 MG PO Tablet Take 1 tablet by mouth 2 times daily as needed for severe hypertension.   ? clopidogrel (PLAVIX) 75 MG PO Tablet TAKE ONE TABLET BY MOUTH ONCE DAILY   ? diclofenac (VOLTAREN GEL) 1 % TD Gel Apply 2 g topically 4 times daily. **Measure dose using enclosed dose card**   ? furosemide (LASIX) 20 MG PO Tablet Take 1 tablet by mouth daily.   ? gabapentin (NEURONTIN) 600 MG PO Tablet Take 1 tablet by mouth 3 times daily.

## 2017-05-31 NOTE — Patient Instructions
1. Discussed with patient the natural history of hydroceles. Not an emergent/urgent condition to treat and it is not life-threatening. It is not cancer. Option for treatment to include: do nothing, surgical correction. Unfortunately, patient is an uncontrolled diabetic and is not an surgical candidate at this time (A1c needs to be between 7-8%).  2. Since I also feel a possible left inguinal hernia, will get a CT Pelvis to confirm this diagnosis.  3. F/u via phone after CT scan is done [phone]

## 2017-05-31 NOTE — Progress Notes
?   HYDROcodone-acetaminophen (NORCO) 5-325 MG PO Tablet Take 1 tablet by mouth every 6 hours as needed for pain. Earliest Fill Date: 05/05/17   ? insulin aspart (NovoLOG) vial 100 units daily via insulin pump   ? ipratropium (ATROVENT) 0.06 % NA Solution 1 spray each nostril daily.   ? lidocaine (XYLOCAINE) 2 % EX Gel Apply topically as needed   ? lidocaine-prilocaine (EMLA) 2.5-2.5 % EX Cream Apply to feet once a day   ? losartan-hydroCHLOROthiazide (HYZAAR) 100-25 MG PO Tablet Take 50/12.5 mg (1/2 tablet) by mouth 2 times daily.   ? metFORMIN (GLUCOPHAGE) 1000 MG PO Tablet Take 1 tablet by mouth 2 times daily (with meals). SrCr: 1.60* mg/dl (28/4103/13 32441225) [last value]   ? metoprolol succinate (TOPROL-XL) 25 MG PO Tablet Extended Release 24 Hour TAKE ONE TABLET BY MOUTH TWICE DAILY   ? mupirocin (BACTROBAN) 2 % EX Cream Bactroban 2 % topical cream   APPLY A SMALL AMOUNT TO THE AFFECTED AREA BY TOPICAL ROUTE 3 TIMES PER DAY FOR 10 DAYS   ? omeprazole (PriLOSEC) 40 MG Capsule Delayed Release Take 1 capsule by mouth daily.   ? ONE TOUCH ULTRA TEST VI Strip 2 times daily (before breakfast and dinner).   ? sertraline (ZOLOFT) 50 MG PO Tablet Take 1 tablet by mouth daily.   ? sildenafil (VIAGRA) 100 MG PO Tablet Take 1 tablet by mouth as needed for erectile dysfunction.   ? tamsulosin (FLOMAX) 0.4 MG PO Capsule Take 1 capsule by mouth daily.     Family History:    Family History   Problem Relation Age of Onset   ? Cancer Mother    ? Diabetes Father    ? Blindness Father    ? Cancer Sister    ? Cancer Brother        Social History:      Social History     Social History   ? Marital status: Married     Spouse name: N/A   ? Number of children: N/A   ? Years of education: N/A     Occupational History   ? Not on file.     Social History Main Topics   ? Smoking status: Never Smoker   ? Smokeless tobacco: Never Used   ? Alcohol use Yes   ? Drug use: No   ? Sexual activity: Yes     Partners: Female     Other Topics Concern

## 2017-05-31 NOTE — Progress Notes
?   Not on file     Social History Narrative   ? No narrative on file       Review of Systems:    ROS     Negative for: Constitutional, Gastrointestinal, Neurological, Skin, Genitourinary, Musculoskeletal, HENT, Endocrine, Cardiovascular, Eyes, Respiratory, Psychiatric, Allergic/Imm, Heme/Lymph    Last edited by Gabriel EaringButler, Tamara N on 05/31/2017 10:36 AM. (History)          Vital Signs:    Vitals Recorded in This Encounter       05/31/2017 1037             BP: 126/79    Pulse: 69    Pulse Source: Radial;Monitor    Resp: 16    Temp: 36.9 ?C (98.4 ?F)    Weight: 85.3 kg (188 lb)    Height: 1.778 m (5\' 10" )    BMI (Calculated): 27.03    Pain Score: Zero    Learning/Communication Barriers?: No    Had recent fall / Last 6 months?: Yes    Does patient have a fear of falling?: No          Physical Exam:    Constitution: Appearance: well-developed, well-nourished, well-groomed.   Vitals: as above.  Eyes: EOMI, non icteric  ENMT: Ears: hearing Unimpaired  Nares: patent, no lesions  Pharynx: clear, no lesions  Neck: Supple, full ROM, No JVD, no lymphadenopathy. Thyroid: no enlargement, no mass.  GI/Abdomen: Abdomen: no tenderness or masses.  Organs: No hepatosplenomegaly. ? Small LIH  Musculoskeletal: Nl Gait, Ext: nl ROM x 4 Back: Full ROM, no CVAT  Skin: No rashes or lesion.  Psychiatric: Mental status: A & O x 3.  Affect: pleasant/calm.    GU: GenUrinary (Male):  Scrotum normal. Nontense left hydrocele.  Testes descended bilaterally. NI landmarks, cords, epididymides.  Phallus: Normal meatus. uncircumcised; retractable prepuce  Anal/Rectal: not done    Chaperone for examination: patient deferred      Labs:  Results for Gary Walters, Gary Walters (MRN [id number]) as of 05/31/2017 11:15   Ref. Range 10/05/2016 12:25 02/04/2017 14:47 05/05/2017 12:24   HGB A1C % Latest Ref Range: <5.7 % of total Hgb 9.0 (H) 8.2 (H) 9.2 (H)     Results for Gary Walters, Gary Walters (MRN [id number]) as of 05/31/2017 11:15   Ref. Range 05/05/2017 12:24

## 2017-06-02 ENCOUNTER — Ambulatory Visit: Attending: Family Medicine | Primary: Family Medicine

## 2017-06-02 DIAGNOSIS — C801 Malignant (primary) neoplasm, unspecified: Secondary | ICD-10-CM

## 2017-06-02 DIAGNOSIS — I10 Essential (primary) hypertension: Secondary | ICD-10-CM

## 2017-06-02 DIAGNOSIS — R51 Headache: Secondary | ICD-10-CM

## 2017-06-02 DIAGNOSIS — I25810 Atherosclerosis of coronary artery bypass graft(s) without angina pectoris: Secondary | ICD-10-CM

## 2017-06-02 DIAGNOSIS — Z6827 Body mass index (BMI) 27.0-27.9, adult: Principal | ICD-10-CM

## 2017-06-02 DIAGNOSIS — I739 Peripheral vascular disease, unspecified: Secondary | ICD-10-CM

## 2017-06-02 DIAGNOSIS — E119 Type 2 diabetes mellitus without complications: Principal | ICD-10-CM

## 2017-06-02 DIAGNOSIS — M542 Cervicalgia: Secondary | ICD-10-CM

## 2017-06-02 DIAGNOSIS — N189 Chronic kidney disease, unspecified: Secondary | ICD-10-CM

## 2017-06-02 DIAGNOSIS — N183 Chronic kidney disease, stage 3 (moderate): Secondary | ICD-10-CM

## 2017-06-02 DIAGNOSIS — I2510 Atherosclerotic heart disease of native coronary artery without angina pectoris: Secondary | ICD-10-CM

## 2017-06-02 DIAGNOSIS — E1151 Type 2 diabetes mellitus with diabetic peripheral angiopathy without gangrene: Secondary | ICD-10-CM

## 2017-06-02 MED ORDER — HYDROCODONE-ACETAMINOPHEN 5-325 MG PO TABS
1 | ORAL_TABLET | Freq: Four times a day (QID) | ORAL | 0 refills | Status: CP | PRN
Start: 2017-06-02 — End: 2017-06-30

## 2017-06-02 NOTE — Progress Notes
Abdominal: Soft. There is no tenderness.        Assessment:       ICD-10-CM ICD-9-CM    1. BMI 27.0-27.9,adult Z68.27 V85.23    2. Cervicalgia M54.2 723.1 HYDROcodone-acetaminophen (NORCO) 5-325 MG PO Tablet   3. Coronary artery disease involving coronary bypass graft of native heart without angina pectoris I25.810 414.05    4. Chronic nonintractable headache, unspecified headache type R51 784.0    5. Stage 3 chronic kidney disease (CMS-HCC code) N18.3 585.3    6. PAD (peripheral artery disease) (CMS-HCC code) I73.9 443.9    7. Diabetes mellitus with peripheral vascular disease (CMS-HCC code) E11.51 250.70      443.81           Plan:   Patient advised to various Tx options and agreed to the following:  Continuation of current medications listed above,the importance of follow up visits.    Risk verses benefits of medication and treatments were discussed.  Patient voiced understanding.      Rev notes   rev labs  Rev cxr wnl   rev urol note   pending ct abd for hernia eval  Fu opht  Rf meds   fu endocrinol for suboptimal dm  Imporve diet  Fu monthly  Orders Placed This Encounter   Medications   ? HYDROcodone-acetaminophen (NORCO) 5-325 MG PO Tablet     Sig: Take 1 tablet by mouth every 6 hours as needed for pain. Earliest Fill Date: 06/02/17     Dispense:  120 tablet     Refill:  0     Order Specific Question:   Please specify condition:     Answer:   Non acute pain (30 day maximum)     Order Specific Question:   Prior to prescribing this controlled substance, has the [place] PDMP website been accessed by yourself or designated staff?     Answer:   Yes, The PDMP website has been accessed     No orders of the following type(s) were placed in this encounter: Procedures      Health Maintenance was reviewed. The patient's HM Topic list was:                                            Health Maintenance   Topic Date Due   ? USPSTF Hepatitis C Screening  [birth year]   ? Diabetic Eye Exam  08/03/2017 (Originally 02/10/2017)

## 2017-06-02 NOTE — Progress Notes
?   Influenza Vaccine (1) 04/30/2018 (Originally 03/26/2017)   ? Zoster Vaccine (1 of 2) 05/10/2018 (Originally 03/07/1997)   ? Preventive Wellness Visit  07/05/2017   ? Hemoglobin A1C  08/05/2017   ? Diabetic Foot Exam  10/26/2017   ? Lipid Profile  05/05/2018   ? Urine Microalbumin  05/05/2018   ? Creatinine  05/05/2018   ? Basic Metabolic Panel  05/05/2018   ? Colon Cancer Screening  11/08/2022   ? DTaP,Tdap,and Td Vaccines (2 - Td) 01/17/2024   ? Pneumovax / Prevnar  Completed           HM    PSA1/16;10/18  PROSTATE  COLONOSC    ADV DIRY    FLU10/16;11/17d  PRENV 6/16  PNEUMOV 8/59F  ZOSTAV 6/16r  SHINGRIX 10/18A    CBC5/18  CMP  LIPIDS  TSH  B12 1/16  VIT D  URINE DIP    HBA1C 5/16;9/17;3/18  FEET 5/16 podiatry fu  OPHTH10/16r;7/17  URINE MICROALB endo;3/18     EKG 4/16  ECHO 2016  STRESS TEST p    SEG DOPPLER 8/16    CARDIO FU  ENDOCRINOL FU DR CHEHADE -  PAIN MNG FU  NUTRITIONIST 8/16R  VASCULAR FU  DERMATOLOGY FU    PAIN CONTRACT  URINE DOA    PAIN CONTROL IS STABLE WITH OVERALL ACCEPTABLE FUNCTIONALITY.  EFORCE REVIEWED WITH EVERY CONTROLLED SUBSTANCE REFILL  RISKS OF MEDICATIONS  AND USE WERE DISCUSSED WITH PATIENT

## 2017-06-02 NOTE — Progress Notes
?   tamsulosin (FLOMAX) 0.4 MG PO Capsule Take 1 capsule by mouth daily.     No current facility-administered medications on file prior to visit.      Allergies   Allergen Reactions   ? Nsaids Other (See Comments)     ?Azotemia         Review of Systems  Review of Systems   All other systems reviewed and are negative.          Objective:        VITAL SIGNS (all recorded)      Clinic Vitals     Row Name 06/02/17 1135                Amb Encounter Vitals    Weight 86.9 kg (191 lb 8 oz)    -BS at 06/02/17 1135       Height 1.778 m (5\' 10" )    -BS at 06/02/17 1135       BMI (Calculated) 27.53    -BS at 06/02/17 1135       BSA (Calculated - sq m) 2.07    -BS at 06/02/17 1135       BP 138/82    -BS at 06/02/17 1135       BP Location Left upper arm    -BS at 06/02/17 1135       Position Sitting    -BS at 06/02/17 1135       Pulse 63    -BS at 06/02/17 1135       Pulse Quality Normal    -BS at 06/02/17 1135       Resp 16    -BS at 06/02/17 1135       Respiration Quality Normal    -BS at 06/02/17 1135       Temp 36.2 ?C (97.1 ?F)    -BS at 06/02/17 1135       Temperature Source Oral    -BS at 06/02/17 1135       Pain Score Five    -BS at 06/02/17 1135          Fall Risk Assessment    Had recent fall / Last 6 months? No recent fall    -BS at 06/02/17 1135       Does patient have a fear of falling? No    -BS at 06/02/17 1135         User Key  (r) = Recorded By, (t) = Taken By, (c) = Cosigned By    Initials Name Effective Dates    BS Sampson SiSinclair, Banesha, [place] 10/28/15 -         Physical Exam   Constitutional: He appears well-developed and well-nourished. No distress.   HENT:   Head: Normocephalic.   Eyes: Pupils are equal, round, and reactive to light.   Cardiovascular: Normal rate, regular rhythm and normal heart sounds.  Exam reveals no gallop and no friction rub.    No murmur heard.  Pulmonary/Chest: Effort normal and breath sounds normal. No respiratory distress. He has no wheezes. He has no rales.

## 2017-06-02 NOTE — Progress Notes
Subjective:   Gary Walters is a 71 y.o. male being seen today for Medications Refill (i will need med refills)       HPI Patient is here for follow up of chronic medical conditions and is currently using the medications listed below.  The current medical conditions are mostly stable.     Please see the Review of symtoms below.  H/o cad pvd cri dm htn pain sy  All cond stable   A1c 9 on insulin pump  Seen by urol for hydrocele was adv no tx   Pending ct abd for the poss hernia  No cp sob abd pain ha  Needs rf meds    Past Medical History:   Diagnosis Date   ? CAD (coronary artery disease)    ? Cancer (CMS-HCC code)    ? CKD (chronic kidney disease) 08/20/2014   ? Diabetes mellitus (CMS-HCC code)    ? Hypertension      Past Surgical History:   Procedure Laterality Date   ? CARDIAC CATHERIZATION     ? CERVICAL FUSION     ? CORONARY ANGIOPLASTY     ? CORONARY ARTERY BYPASS GRAFT      3 vessel     Family History   Problem Relation Age of Onset   ? Cancer Mother    ? Diabetes Father    ? Blindness Father    ? Cancer Sister    ? Cancer Brother      Social History     Social History   ? Marital status: Married     Spouse name: N/A   ? Number of children: N/A   ? Years of education: N/A     Occupational History   ? Not on file.     Social History Main Topics   ? Smoking status: Never Smoker   ? Smokeless tobacco: Never Used   ? Alcohol use Yes   ? Drug use: No   ? Sexual activity: Yes     Partners: Female     Other Topics Concern   ? Not on file     Social History Narrative   ? No narrative on file     Current Outpatient Prescriptions on File Prior to Visit   Medication Sig   ? amLODIPine (NORVASC) 10 MG PO Tablet Take 5 mg (1/2 tablet) by mouth 2 times daily.   ? aspirin 81 MG PO Tablet Chewable Chew 1 tablet daily.   ? atorvastatin (LIPITOR) 40 MG Tablet TAKE ONE TABLET BY MOUTH ONCE DAILY   ? Bromfenac Sodium (PROLENSA) 0.07 % OP Solution Prolensa 0.07 % eye drops

## 2017-06-02 NOTE — Progress Notes
?   cephALEXin (KEFLEX) 500 MG PO Capsule Take 1 capsule by mouth 2 times daily.   ? cetirizine (ZyrTEC) 10 MG Tablet Take 5 mg (1/2 tablet) by mouth daily.   ? cloNIDine (CATAPRES) 0.1 MG PO Tablet Take 1 tablet by mouth 2 times daily as needed for severe hypertension.   ? clopidogrel (PLAVIX) 75 MG PO Tablet TAKE ONE TABLET BY MOUTH ONCE DAILY   ? diclofenac (VOLTAREN GEL) 1 % TD Gel Apply 2 g topically 4 times daily. **Measure dose using enclosed dose card**   ? furosemide (LASIX) 20 MG PO Tablet Take 1 tablet by mouth daily.   ? gabapentin (NEURONTIN) 600 MG PO Tablet Take 1 tablet by mouth 3 times daily.   ? [DISCONTINUED] HYDROcodone-acetaminophen (NORCO) 5-325 MG PO Tablet Take 1 tablet by mouth every 6 hours as needed for pain. Earliest Fill Date: 05/05/17   ? insulin aspart (NovoLOG) vial 100 units daily via insulin pump   ? ipratropium (ATROVENT) 0.06 % NA Solution 1 spray each nostril daily.   ? lidocaine (XYLOCAINE) 2 % EX Gel Apply topically as needed   ? lidocaine-prilocaine (EMLA) 2.5-2.5 % EX Cream Apply to feet once a day   ? losartan-hydroCHLOROthiazide (HYZAAR) 100-25 MG PO Tablet Take 50/12.5 mg (1/2 tablet) by mouth 2 times daily.   ? metFORMIN (GLUCOPHAGE) 1000 MG PO Tablet Take 1 tablet by mouth 2 times daily (with meals). SrCr: 1.60* mg/dl (96/0403/13 54091225) [last value]   ? metoprolol succinate (TOPROL-XL) 25 MG PO Tablet Extended Release 24 Hour TAKE ONE TABLET BY MOUTH TWICE DAILY   ? mupirocin (BACTROBAN) 2 % EX Cream Bactroban 2 % topical cream   APPLY A SMALL AMOUNT TO THE AFFECTED AREA BY TOPICAL ROUTE 3 TIMES PER DAY FOR 10 DAYS   ? omeprazole (PriLOSEC) 40 MG Capsule Delayed Release Take 1 capsule by mouth daily.   ? ONE TOUCH ULTRA TEST VI Strip 2 times daily (before breakfast and dinner).   ? sertraline (ZOLOFT) 50 MG PO Tablet Take 1 tablet by mouth daily.   ? sildenafil (VIAGRA) 100 MG PO Tablet Take 1 tablet by mouth as needed for erectile dysfunction.

## 2017-06-06 NOTE — Progress Notes
This encounter was created in error - please disregard.

## 2017-06-07 ENCOUNTER — Encounter: Primary: Family Medicine

## 2017-06-09 ENCOUNTER — Inpatient Hospital Stay: Admit: 2017-06-09 | Discharge: 2017-06-10 | Primary: Family Medicine

## 2017-06-09 DIAGNOSIS — I517 Cardiomegaly: Secondary | ICD-10-CM

## 2017-06-09 DIAGNOSIS — R9430 Abnormal result of cardiovascular function study, unspecified: Principal | ICD-10-CM

## 2017-06-09 DIAGNOSIS — I340 Nonrheumatic mitral (valve) insufficiency: Secondary | ICD-10-CM

## 2017-06-21 DIAGNOSIS — N5089 Other specified disorders of the male genital organs: Principal | ICD-10-CM

## 2017-06-22 ENCOUNTER — Encounter: Primary: Family Medicine

## 2017-06-24 ENCOUNTER — Inpatient Hospital Stay: Primary: Family Medicine

## 2017-06-24 DIAGNOSIS — K4090 Unilateral inguinal hernia, without obstruction or gangrene, not specified as recurrent: Principal | ICD-10-CM

## 2017-06-30 ENCOUNTER — Encounter: Attending: "Endocrinology | Primary: Family Medicine

## 2017-06-30 ENCOUNTER — Encounter: Primary: Family Medicine

## 2017-06-30 ENCOUNTER — Ambulatory Visit: Attending: Family Medicine | Primary: Family Medicine

## 2017-06-30 DIAGNOSIS — I10 Essential (primary) hypertension: Secondary | ICD-10-CM

## 2017-06-30 DIAGNOSIS — Z85820 Personal history of malignant melanoma of skin: Secondary | ICD-10-CM

## 2017-06-30 DIAGNOSIS — M170 Bilateral primary osteoarthritis of knee: Secondary | ICD-10-CM

## 2017-06-30 DIAGNOSIS — N400 Enlarged prostate without lower urinary tract symptoms: Secondary | ICD-10-CM

## 2017-06-30 DIAGNOSIS — N183 Chronic kidney disease, stage 3 (moderate): Secondary | ICD-10-CM

## 2017-06-30 DIAGNOSIS — Z951 Presence of aortocoronary bypass graft: Secondary | ICD-10-CM

## 2017-06-30 DIAGNOSIS — Z981 Arthrodesis status: Secondary | ICD-10-CM

## 2017-06-30 DIAGNOSIS — I25810 Atherosclerosis of coronary artery bypass graft(s) without angina pectoris: Secondary | ICD-10-CM

## 2017-06-30 DIAGNOSIS — Z9641 Presence of insulin pump (external) (internal): Secondary | ICD-10-CM

## 2017-06-30 DIAGNOSIS — I252 Old myocardial infarction: Secondary | ICD-10-CM

## 2017-06-30 DIAGNOSIS — Z9119 Patient's noncompliance with other medical treatment and regimen: Secondary | ICD-10-CM

## 2017-06-30 DIAGNOSIS — E785 Hyperlipidemia, unspecified: Secondary | ICD-10-CM

## 2017-06-30 DIAGNOSIS — E1151 Type 2 diabetes mellitus with diabetic peripheral angiopathy without gangrene: Secondary | ICD-10-CM

## 2017-06-30 DIAGNOSIS — Z6827 Body mass index (BMI) 27.0-27.9, adult: Principal | ICD-10-CM

## 2017-06-30 DIAGNOSIS — K219 Gastro-esophageal reflux disease without esophagitis: Secondary | ICD-10-CM

## 2017-06-30 DIAGNOSIS — E538 Deficiency of other specified B group vitamins: Secondary | ICD-10-CM

## 2017-06-30 DIAGNOSIS — I5189 Other ill-defined heart diseases: Secondary | ICD-10-CM

## 2017-06-30 DIAGNOSIS — M542 Cervicalgia: Secondary | ICD-10-CM

## 2017-06-30 DIAGNOSIS — R05 Cough: Secondary | ICD-10-CM

## 2017-06-30 MED ORDER — HYDROCODONE-ACETAMINOPHEN 5-325 MG PO TABS
1 | ORAL_TABLET | Freq: Four times a day (QID) | ORAL | 0 refills | Status: CP | PRN
Start: 2017-06-30 — End: 2017-07-27

## 2017-06-30 NOTE — Progress Notes
?   cetirizine (ZyrTEC) 10 MG Tablet Take 5 mg (1/2 tablet) by mouth daily.   ? cloNIDine (CATAPRES) 0.1 MG PO Tablet Take 1 tablet by mouth 2 times daily as needed for severe hypertension.   ? clopidogrel (PLAVIX) 75 MG PO Tablet TAKE ONE TABLET BY MOUTH ONCE DAILY   ? diclofenac (VOLTAREN GEL) 1 % TD Gel Apply 2 g topically 4 times daily. **Measure dose using enclosed dose card**   ? furosemide (LASIX) 20 MG PO Tablet Take 1 tablet by mouth daily.   ? gabapentin (NEURONTIN) 600 MG PO Tablet Take 1 tablet by mouth 3 times daily.   ? [DISCONTINUED] HYDROcodone-acetaminophen (NORCO) 5-325 MG PO Tablet Take 1 tablet by mouth every 6 hours as needed for pain. Earliest Fill Date: 06/02/17   ? insulin aspart (NovoLOG) vial 100 units daily via insulin pump   ? ipratropium (ATROVENT) 0.06 % NA Solution 1 spray each nostril daily.   ? lidocaine (XYLOCAINE) 2 % EX Gel Apply topically as needed   ? lidocaine-prilocaine (EMLA) 2.5-2.5 % EX Cream Apply to feet once a day   ? losartan-hydroCHLOROthiazide (HYZAAR) 100-25 MG PO Tablet Take 50/12.5 mg (1/2 tablet) by mouth 2 times daily.   ? metFORMIN (GLUCOPHAGE) 1000 MG PO Tablet Take 1 tablet by mouth 2 times daily (with meals). SrCr: 1.60* mg/dl (54/0903/13 81191225) [last value]   ? metoprolol succinate (TOPROL-XL) 25 MG PO Tablet Extended Release 24 Hour TAKE ONE TABLET BY MOUTH TWICE DAILY   ? mupirocin (BACTROBAN) 2 % EX Cream Bactroban 2 % topical cream   APPLY A SMALL AMOUNT TO THE AFFECTED AREA BY TOPICAL ROUTE 3 TIMES PER DAY FOR 10 DAYS   ? omeprazole (PriLOSEC) 40 MG Capsule Delayed Release Take 1 capsule by mouth daily.   ? ONE TOUCH ULTRA TEST VI Strip 2 times daily (before breakfast and dinner).   ? sertraline (ZOLOFT) 50 MG PO Tablet Take 1 tablet by mouth daily.   ? sildenafil (VIAGRA) 100 MG PO Tablet Take 1 tablet by mouth as needed for erectile dysfunction.   ? tamsulosin (FLOMAX) 0.4 MG PO Capsule Take 1 capsule by mouth daily.

## 2017-06-30 NOTE — Progress Notes
No current facility-administered medications on file prior to visit.      Allergies   Allergen Reactions   ? Nsaids Other (See Comments)     ?Azotemia         Review of Systems  Review of Systems   All other systems reviewed and are negative.          Objective:        VITAL SIGNS (all recorded)      Clinic Vitals     Row Name 06/30/17 1108                Amb Encounter Vitals    Weight 87.5 kg (193 lb)    -LC at 06/30/17 1109       Height 1.778 m (5\' 10" )    -LC at 06/30/17 1109       BMI (Calculated) 27.75    -LC at 06/30/17 1109       BSA (Calculated - sq m) 2.08    -LC at 06/30/17 1109       BP 131/69    -LC at 06/30/17 1109       BP Location Left upper arm    -LC at 06/30/17 1109       Position Sitting    -LC at 06/30/17 1109       Pulse 68    -LC at 06/30/17 1109       Pulse Source Radial    -LC at 06/30/17 1109       Pulse Quality Normal    -LC at 06/30/17 1109       Resp 14    -LC at 06/30/17 1109       Respiration Quality Normal    -LC at 06/30/17 1109       Temp 36.4 ?C (97.6 ?F)    -LC at 06/30/17 1109       Temperature Source Oral    -LC at 06/30/17 1109       Pain Score Zero    -LC at 06/30/17 1109          Education/Communication Barriers?    Learning/Communication Barriers? No    -LC at 06/30/17 1109          Fall Risk Assessment    Had recent fall / Last 6 months? No recent fall    -LC at 06/30/17 1109       Does patient have a fear of falling? No    -LC at 06/30/17 1109         User Key  (r) = Recorded By, (t) = Taken By, (c) = Cosigned By    [facility] Name Effective Dates    Ginger OrganLC [place], Lisa, [place] 06/02/17 -         Physical Exam   Constitutional: He appears well-developed and well-nourished. No distress.   HENT:   Head: Normocephalic.   Eyes: Pupils are equal, round, and reactive to light.   Cardiovascular: Normal rate, regular rhythm and normal heart sounds.  Exam reveals no gallop and no friction rub.    No murmur heard.  Pulmonary/Chest: Effort normal and breath sounds normal. No respiratory

## 2017-06-30 NOTE — Progress Notes
Dispense:  120 tablet     Refill:  0     Order Specific Question:   Please specify condition:     Answer:   Non acute pain (30 day maximum)     Order Specific Question:   Prior to prescribing this controlled substance, has the [place] PDMP website been accessed by yourself or designated staff?     Answer:   Yes, The PDMP website has been accessed     Orders Placed This Encounter   Procedures   ? Refer to Pulmonology       Health Maintenance was reviewed. The patient's HM Topic list was:                                            Health Maintenance   Topic Date Due   ? USPSTF Hepatitis C Screening  Mar 15, 1946   ? Diabetic Eye Exam  08/03/2017 (Originally 02/10/2017)   ? Influenza Vaccine (1) 04/30/2018 (Originally 03/26/2017)   ? Zoster Vaccine (1 of 2) 05/10/2018 (Originally 03/07/1997)   ? Preventive Wellness Visit  07/05/2017   ? Hemoglobin A1C  08/05/2017   ? Diabetic Foot Exam  10/26/2017   ? Lipid Profile  05/05/2018   ? Urine Microalbumin  05/05/2018   ? Creatinine  05/05/2018   ? Basic Metabolic Panel  05/05/2018   ? Colon Cancer Screening  11/08/2022   ? DTaP,Tdap,and Td Vaccines (2 - Td) 01/17/2024   ? Pneumovax / Prevnar  Completed         HM    PSA1/16;10/18  PROSTATE  COLONOSC    ADV DIRY    FLU10/16;11/17d  PRENV 6/16  PNEUMOV 8/92F  ZOSTAV 6/16r  SHINGRIX 10/18A    CBC5/18  CMP  LIPIDS  TSH  B12 1/16  VIT D  URINE DIP    HBA1C 5/16;9/17;3/18  FEET 5/16 podiatry fu  OPHTH10/16r;7/17  URINE MICROALB endo;3/18     EKG 4/16  ECHO 2016  STRESS TEST p    SEG DOPPLER 8/16    CARDIO FU  ENDOCRINOL FU DR CHEHADE -  PAIN MNG FU  NUTRITIONIST 8/16R  VASCULAR FU  DERMATOLOGY FU    PAIN CONTRACT  URINE DOA    PAIN CONTROL IS STABLE WITH OVERALL ACCEPTABLE FUNCTIONALITY.  EFORCE REVIEWED WITH EVERY CONTROLLED SUBSTANCE REFILL  RISKS OF MEDICATIONS  AND USE WERE DISCUSSED WITH PATIENT

## 2017-06-30 NOTE — Progress Notes
MiSusaJolyne Ton g CastillarlyGi Diagnosti107c ZOXWRKoreaE:[id number] CastillaendSouthpoBuckSusa LoFayrene FearingleroffGEarn67mest[[id number] Casandell Reams18

## 2017-06-30 NOTE — Progress Notes
Subjective:   Gary Walters is a 71 y.o. male being seen today for Medications Refill (pt. is here for med refills)       HPI Patient is here for follow up of chronic medical conditions and is currently using the medications listed below.  The current medical conditions are mostly stable.     Please see the Review of symtoms below.  H/o cad dm uncontrolled on insulin pump backache pvd  All cond unchanged   no new cp sob abpain  Needs rf meds  States needs referra.l to pulmo advised by his cardiologist due to chr cough  Cxr was nl  Likely postnasal drip    Past Medical History:   Diagnosis Date   ? CAD (coronary artery disease)    ? Cancer (CMS-HCC code)    ? CKD (chronic kidney disease) 08/20/2014   ? Diabetes mellitus (CMS-HCC code)    ? Hypertension      Past Surgical History:   Procedure Laterality Date   ? CARDIAC CATHERIZATION     ? CERVICAL FUSION     ? CORONARY ANGIOPLASTY     ? CORONARY ARTERY BYPASS GRAFT      3 vessel     Family History   Problem Relation Age of Onset   ? Cancer Mother    ? Diabetes Father    ? Blindness Father    ? Cancer Sister    ? Cancer Brother      Social History     Social History   ? Marital status: Married     Spouse name: N/A   ? Number of children: N/A   ? Years of education: N/A     Occupational History   ? Not on file.     Social History Main Topics   ? Smoking status: Never Smoker   ? Smokeless tobacco: Never Used   ? Alcohol use Yes   ? Drug use: No   ? Sexual activity: Yes     Partners: Female     Other Topics Concern   ? Not on file     Social History Narrative   ? No narrative on file     Current Outpatient Prescriptions on File Prior to Visit   Medication Sig   ? amLODIPine (NORVASC) 10 MG PO Tablet Take 5 mg (1/2 tablet) by mouth 2 times daily.   ? aspirin 81 MG PO Tablet Chewable Chew 1 tablet daily.   ? atorvastatin (LIPITOR) 40 MG Tablet TAKE ONE TABLET BY MOUTH ONCE DAILY   ? Bromfenac Sodium (PROLENSA) 0.07 % OP Solution Prolensa 0.07 % eye drops

## 2017-07-27 ENCOUNTER — Ambulatory Visit: Attending: "Endocrinology | Primary: Family Medicine

## 2017-07-27 ENCOUNTER — Encounter: Primary: Family Medicine

## 2017-07-27 ENCOUNTER — Ambulatory Visit: Attending: Family Medicine | Primary: Family Medicine

## 2017-07-27 DIAGNOSIS — Z981 Arthrodesis status: Secondary | ICD-10-CM

## 2017-07-27 DIAGNOSIS — E538 Deficiency of other specified B group vitamins: Secondary | ICD-10-CM

## 2017-07-27 DIAGNOSIS — N400 Enlarged prostate without lower urinary tract symptoms: Principal | ICD-10-CM

## 2017-07-27 DIAGNOSIS — Z6827 Body mass index (BMI) 27.0-27.9, adult: Secondary | ICD-10-CM

## 2017-07-27 DIAGNOSIS — K219 Gastro-esophageal reflux disease without esophagitis: Secondary | ICD-10-CM

## 2017-07-27 DIAGNOSIS — I5189 Other ill-defined heart diseases: Secondary | ICD-10-CM

## 2017-07-27 DIAGNOSIS — M542 Cervicalgia: Secondary | ICD-10-CM

## 2017-07-27 DIAGNOSIS — E785 Hyperlipidemia, unspecified: Secondary | ICD-10-CM

## 2017-07-27 DIAGNOSIS — Z9641 Presence of insulin pump (external) (internal): Secondary | ICD-10-CM

## 2017-07-27 DIAGNOSIS — Z006 Encounter for examination for normal comparison and control in clinical research program: Principal | ICD-10-CM

## 2017-07-27 DIAGNOSIS — M170 Bilateral primary osteoarthritis of knee: Secondary | ICD-10-CM

## 2017-07-27 DIAGNOSIS — Z85820 Personal history of malignant melanoma of skin: Secondary | ICD-10-CM

## 2017-07-27 DIAGNOSIS — N183 Chronic kidney disease, stage 3 (moderate): Secondary | ICD-10-CM

## 2017-07-27 DIAGNOSIS — I25810 Atherosclerosis of coronary artery bypass graft(s) without angina pectoris: Secondary | ICD-10-CM

## 2017-07-27 DIAGNOSIS — I10 Essential (primary) hypertension: Secondary | ICD-10-CM

## 2017-07-27 DIAGNOSIS — E1151 Type 2 diabetes mellitus with diabetic peripheral angiopathy without gangrene: Secondary | ICD-10-CM

## 2017-07-27 DIAGNOSIS — Z951 Presence of aortocoronary bypass graft: Secondary | ICD-10-CM

## 2017-07-27 MED ORDER — HYDROCODONE-ACETAMINOPHEN 5-325 MG PO TABS
1 | ORAL_TABLET | Freq: Four times a day (QID) | ORAL | 0 refills | Status: CP | PRN
Start: 2017-07-27 — End: 2017-10-03

## 2017-07-27 MED ORDER — HYDROCODONE-ACETAMINOPHEN 5-325 MG PO TABS
1 | ORAL_TABLET | Freq: Four times a day (QID) | ORAL | 0 refills | Status: CP | PRN
Start: 2017-07-27 — End: 2017-07-27

## 2017-07-27 NOTE — Progress Notes
discussed.  Patient voiced understanding.       rev notes  Rev labs  Rf meds   cont currnt meds  Want knee injection last ones a year ago  PAIN CONTROL IS STABLE WITH OVERALL ACCEPTABLE FUNCTIONALITY.1/19  EFORCE REVIEWED WITH EVERY CONTROLLED SUBSTANCE REFILL  RISKS OF MEDICATIONS  AND USE WERE DISCUSSED WITH PATIENT    Fu pulmo in  March  Fu endo he is in the study thinks he got a right drug but not sure    Pt was prepped and 40 mg kenalog and 5 cc lidocine injected in both knees pt tolerated proc well  Fu 2 months    No orders of the following type(s) were placed in this encounter: Medications.     No orders of the following type(s) were placed in this encounter: Procedures      Health Maintenance was reviewed. The patient's HM Topic list was:                                            Health Maintenance   Topic Date Due   ? USPSTF Hepatitis C Screening  [birth year]   ? Preventive Wellness Visit  07/05/2017   ? Hemoglobin A1C  08/05/2017   ? Diabetic Eye Exam  08/03/2017 (Originally 02/10/2017)   ? Influenza Vaccine (1) 04/30/2018 (Originally 03/26/2017)   ? Zoster Vaccine (1 of 2) 05/10/2018 (Originally 03/07/1997)   ? Diabetic Foot Exam  10/26/2017   ? Lipid Profile  05/05/2018   ? Urine Microalbumin  05/05/2018   ? Creatinine  05/05/2018   ? Basic Metabolic Panel  05/05/2018   ? Colon Cancer Screening  11/08/2022   ? DTaP,Tdap,and Td Vaccines (2 - Td) 01/17/2024   ? Pneumovax / Prevnar  Completed         HM    PSA1/16;10/18  PROSTATE  COLONOSC    ADV DIRY    FLU10/16;11/17d  PRENV 6/16  PNEUMOV 8/37F  ZOSTAV 6/16r  SHINGRIX 10/18A    CBC5/18  CMP  LIPIDS  TSH  B12 1/16  VIT D  URINE DIP    HBA1C 5/16;9/17;3/18  FEET 5/16 podiatry fu  OPHTH10/16r;7/17  URINE MICROALB endo;3/18     EKG 4/16  ECHO 2016  STRESS TEST p    SEG DOPPLER 8/16    CARDIO FU  ENDOCRINOL FU DR CHEHADE -  PAIN MNG FU  NUTRITIONIST 8/16R  VASCULAR FU  DERMATOLOGY FU    PAIN CONTRACT  URINE DOA

## 2017-07-27 NOTE — Progress Notes
Subjective:   Gary Walters is a 72 y.o. male being seen today for No chief complaint on file.       HPI  Patient is here for follow up of chronic medical conditions and is currently using the medications listed below.  The current medical conditions are mostly stable.     Please see the Review of symtoms below.  H/o cad bacakache oa knees cri dm  All cond stable except worsening knee pain  In the past received steroids by orhto  Has bee a year since  No cp sob ab pian  Needs rf meds   pain is well contr 5/10 when on meds    Past Medical History:   Diagnosis Date   ? CAD (coronary artery disease)    ? Cancer (CMS-HCC code)    ? CKD (chronic kidney disease) 08/20/2014   ? Diabetes mellitus (CMS-HCC code)    ? Hypertension      Past Surgical History:   Procedure Laterality Date   ? CARDIAC CATHERIZATION     ? CERVICAL FUSION     ? CORONARY ANGIOPLASTY     ? CORONARY ARTERY BYPASS GRAFT      3 vessel     Family History   Problem Relation Age of Onset   ? Cancer Mother    ? Diabetes Father    ? Blindness Father    ? Cancer Sister    ? Cancer Brother      Social History     Social History   ? Marital status: Married     Spouse name: N/A   ? Number of children: N/A   ? Years of education: N/A     Occupational History   ? Not on file.     Social History Main Topics   ? Smoking status: Never Smoker   ? Smokeless tobacco: Never Used   ? Alcohol use Yes   ? Drug use: No   ? Sexual activity: Yes     Partners: Female     Other Topics Concern   ? Not on file     Social History Narrative   ? No narrative on file     Current Outpatient Prescriptions on File Prior to Visit   Medication Sig   ? amLODIPine (NORVASC) 10 MG PO Tablet Take 5 mg (1/2 tablet) by mouth 2 times daily.   ? aspirin 81 MG PO Tablet Chewable Chew 1 tablet daily.   ? atorvastatin (LIPITOR) 40 MG Tablet TAKE ONE TABLET BY MOUTH ONCE DAILY   ? Bromfenac Sodium (PROLENSA) 0.07 % OP Solution Prolensa 0.07 % eye drops

## 2017-07-27 NOTE — Progress Notes
PAIN CONTROL IS STABLE WITH OVERALL ACCEPTABLE FUNCTIONALITY.1/19  EFORCE REVIEWED WITH EVERY CONTROLLED SUBSTANCE REFILL  RISKS OF MEDICATIONS  AND USE WERE DISCUSSED WITH PATIENT

## 2017-07-27 NOTE — Progress Notes
?   cetirizine (ZyrTEC) 10 MG Tablet Take 5 mg (1/2 tablet) by mouth daily.   ? cloNIDine (CATAPRES) 0.1 MG PO Tablet Take 1 tablet by mouth 2 times daily as needed for severe hypertension.   ? clopidogrel (PLAVIX) 75 MG PO Tablet TAKE ONE TABLET BY MOUTH ONCE DAILY   ? diclofenac (VOLTAREN GEL) 1 % TD Gel Apply 2 g topically 4 times daily. **Measure dose using enclosed dose card**   ? furosemide (LASIX) 20 MG PO Tablet Take 1 tablet by mouth daily.   ? gabapentin (NEURONTIN) 600 MG PO Tablet Take 1 tablet by mouth 3 times daily.   ? HYDROcodone-acetaminophen (NORCO) 5-325 MG PO Tablet Take 1 tablet by mouth every 6 hours as needed for pain. Earliest Fill Date: 06/30/17   ? insulin aspart (NovoLOG) vial 100 units daily via insulin pump   ? ipratropium (ATROVENT) 0.06 % NA Solution 1 spray each nostril daily.   ? lidocaine (XYLOCAINE) 2 % EX Gel Apply topically as needed   ? lidocaine-prilocaine (EMLA) 2.5-2.5 % EX Cream Apply to feet once a day   ? losartan-hydroCHLOROthiazide (HYZAAR) 100-25 MG PO Tablet Take 50/12.5 mg (1/2 tablet) by mouth 2 times daily.   ? metFORMIN (GLUCOPHAGE) 1000 MG PO Tablet Take 1 tablet by mouth 2 times daily (with meals). SrCr: 1.60* mg/dl (16/1003/13 96041225) [last value]   ? metoprolol succinate (TOPROL-XL) 25 MG PO Tablet Extended Release 24 Hour TAKE ONE TABLET BY MOUTH TWICE DAILY   ? mupirocin (BACTROBAN) 2 % EX Cream Bactroban 2 % topical cream   APPLY A SMALL AMOUNT TO THE AFFECTED AREA BY TOPICAL ROUTE 3 TIMES PER DAY FOR 10 DAYS   ? omeprazole (PriLOSEC) 40 MG Capsule Delayed Release Take 1 capsule by mouth daily.   ? ONE TOUCH ULTRA TEST VI Strip 2 times daily (before breakfast and dinner).   ? sertraline (ZOLOFT) 50 MG PO Tablet Take 1 tablet by mouth daily.   ? sildenafil (VIAGRA) 100 MG PO Tablet Take 1 tablet by mouth as needed for erectile dysfunction.   ? tamsulosin (FLOMAX) 0.4 MG PO Capsule Take 1 capsule by mouth daily.

## 2017-07-27 NOTE — Progress Notes
No current facility-administered medications on file prior to visit.      Allergies   Allergen Reactions   ? Nsaids Other (See Comments)     ?Azotemia         Review of Systems  Review of Systems   All other systems reviewed and are negative.          Objective:       Physical Exam   Constitutional: He appears well-developed and well-nourished. No distress.   HENT:   Head: Normocephalic.   Eyes: Pupils are equal, round, and reactive to light.   Cardiovascular: Normal rate, regular rhythm and normal heart sounds.  Exam reveals no gallop and no friction rub.    No murmur heard.  Pulmonary/Chest: Effort normal and breath sounds normal. No respiratory distress. He has no wheezes. He has no rales.   Abdominal: Soft. There is no tenderness.   Musculoskeletal:   Decr rom knees due to pain        Assessment:       ICD-10-CM ICD-9-CM    1. Benign prostatic hyperplasia, unspecified whether lower urinary tract symptoms present N40.0 600.00    2. Vitamin B12 deficiency E53.8 266.2    3. Gastroesophageal reflux disease without esophagitis K21.9 530.81    4. Labile essential hypertension I10 401.9    5. Dyslipidemia E78.5 272.4    6. History of cutaneous melanoma Z85.820 V10.82    7. Status post cervical spinal fusion Z98.1 V45.4    8. Primary osteoarthritis of both knees M17.0 715.16    9. Coronary artery disease involving coronary bypass graft of native heart without angina pectoris I25.810 414.05    10. History of 3V CABG in 2015 Z95.1 V45.81    11. Stage 3 chronic kidney disease (CMS-HCC code) N18.3 585.3    12. Status post insulin pump placement Z96.41 V45.85    13. Diastolic dysfunction I51.9 429.9    14. Diabetes mellitus with peripheral vascular disease (CMS-HCC code) E11.51 250.70      443.81           Plan:   Patient advised to various Tx options and agreed to the following:  Continuation of current medications listed above,the importance of follow up visits.    Risk verses benefits of medication and treatments were

## 2017-08-25 DIAGNOSIS — I25118 Atherosclerotic heart disease of native coronary artery with other forms of angina pectoris: Secondary | ICD-10-CM

## 2017-08-25 DIAGNOSIS — I10 Essential (primary) hypertension: Secondary | ICD-10-CM

## 2017-09-09 ENCOUNTER — Encounter: Primary: Family Medicine

## 2017-09-21 DIAGNOSIS — M25561 Pain in right knee: Principal | ICD-10-CM

## 2017-09-21 DIAGNOSIS — M25562 Pain in left knee: Secondary | ICD-10-CM

## 2017-09-28 ENCOUNTER — Encounter: Attending: Orthopaedic Surgery | Primary: Family Medicine

## 2017-09-29 DIAGNOSIS — K4090 Unilateral inguinal hernia, without obstruction or gangrene, not specified as recurrent: Principal | ICD-10-CM

## 2017-10-03 ENCOUNTER — Inpatient Hospital Stay: Admit: 2017-10-03 | Discharge: 2017-10-04 | Primary: Family Medicine

## 2017-10-03 ENCOUNTER — Ambulatory Visit: Attending: Family Medicine | Primary: Family Medicine

## 2017-10-03 ENCOUNTER — Encounter: Attending: Podiatrist | Primary: Family Medicine

## 2017-10-03 ENCOUNTER — Ambulatory Visit: Attending: Critical Care Medicine | Primary: Family Medicine

## 2017-10-03 ENCOUNTER — Encounter: Primary: Family Medicine

## 2017-10-03 ENCOUNTER — Encounter: Attending: Family Medicine | Primary: Family Medicine

## 2017-10-03 DIAGNOSIS — K219 Gastro-esophageal reflux disease without esophagitis: Secondary | ICD-10-CM

## 2017-10-03 DIAGNOSIS — E119 Type 2 diabetes mellitus without complications: Principal | ICD-10-CM

## 2017-10-03 DIAGNOSIS — N183 Chronic kidney disease, stage 3 (moderate): Secondary | ICD-10-CM

## 2017-10-03 DIAGNOSIS — M542 Cervicalgia: Principal | ICD-10-CM

## 2017-10-03 DIAGNOSIS — I5189 Other ill-defined heart diseases: Secondary | ICD-10-CM

## 2017-10-03 DIAGNOSIS — I25810 Atherosclerosis of coronary artery bypass graft(s) without angina pectoris: Secondary | ICD-10-CM

## 2017-10-03 DIAGNOSIS — M170 Bilateral primary osteoarthritis of knee: Secondary | ICD-10-CM

## 2017-10-03 DIAGNOSIS — Z9641 Presence of insulin pump (external) (internal): Secondary | ICD-10-CM

## 2017-10-03 DIAGNOSIS — K4090 Unilateral inguinal hernia, without obstruction or gangrene, not specified as recurrent: Principal | ICD-10-CM

## 2017-10-03 DIAGNOSIS — E1151 Type 2 diabetes mellitus with diabetic peripheral angiopathy without gangrene: Secondary | ICD-10-CM

## 2017-10-03 DIAGNOSIS — Z6827 Body mass index (BMI) 27.0-27.9, adult: Secondary | ICD-10-CM

## 2017-10-03 DIAGNOSIS — I10 Essential (primary) hypertension: Secondary | ICD-10-CM

## 2017-10-03 DIAGNOSIS — I2510 Atherosclerotic heart disease of native coronary artery without angina pectoris: Secondary | ICD-10-CM

## 2017-10-03 DIAGNOSIS — E538 Deficiency of other specified B group vitamins: Secondary | ICD-10-CM

## 2017-10-03 DIAGNOSIS — N189 Chronic kidney disease, unspecified: Secondary | ICD-10-CM

## 2017-10-03 DIAGNOSIS — C801 Malignant (primary) neoplasm, unspecified: Secondary | ICD-10-CM

## 2017-10-03 DIAGNOSIS — R042 Hemoptysis: Secondary | ICD-10-CM

## 2017-10-03 DIAGNOSIS — Z951 Presence of aortocoronary bypass graft: Secondary | ICD-10-CM

## 2017-10-03 DIAGNOSIS — R05 Cough: Principal | ICD-10-CM

## 2017-10-03 DIAGNOSIS — N400 Enlarged prostate without lower urinary tract symptoms: Secondary | ICD-10-CM

## 2017-10-03 MED ORDER — FLUOROURACIL 5 % EX CREA: Start: 2017-10-03 — End: 2018-06-27

## 2017-10-03 MED ORDER — CEPHALEXIN 500 MG PO CAPS: Start: 2017-10-03 — End: 2017-10-03

## 2017-10-03 MED ORDER — LOSARTAN POTASSIUM 100 MG PO TABS: Start: 2017-10-03 — End: 2017-10-20

## 2017-10-03 MED ORDER — HYDROCODONE-ACETAMINOPHEN 5-325 MG PO TABS
1 | ORAL_TABLET | Freq: Four times a day (QID) | ORAL | 0 refills | Status: CP | PRN
Start: 2017-10-03 — End: 2017-12-13

## 2017-10-03 MED ORDER — BENZONATATE 100 MG PO CAPS
100 mg | Freq: Three times a day (TID) | ORAL | 3 refills | Status: CP | PRN
Start: 2017-10-03 — End: ?

## 2017-10-03 MED ORDER — HYDROCHLOROTHIAZIDE 25 MG PO TABS
25 mg | Freq: Every day | ORAL | PRN
Start: 2017-10-03 — End: 2017-12-28

## 2017-10-03 MED ORDER — HYDROCODONE-ACETAMINOPHEN 5-325 MG PO TABS
1 | ORAL_TABLET | Freq: Four times a day (QID) | ORAL | 0 refills | Status: CP | PRN
Start: 2017-10-03 — End: 2017-10-03

## 2017-10-03 NOTE — Addendum Note
Addended byGearlean Alf: [name], [name] on: 10/03/2017 09:31 AM     Modules accepted: Orders

## 2017-10-04 DIAGNOSIS — K4090 Unilateral inguinal hernia, without obstruction or gangrene, not specified as recurrent: Principal | ICD-10-CM

## 2017-10-05 ENCOUNTER — Encounter: Attending: Internal Medicine | Primary: Family Medicine

## 2017-10-11 ENCOUNTER — Ambulatory Visit: Attending: Surgery | Primary: Family Medicine

## 2017-10-11 DIAGNOSIS — E1122 Type 2 diabetes mellitus with diabetic chronic kidney disease: Secondary | ICD-10-CM

## 2017-10-11 DIAGNOSIS — N189 Chronic kidney disease, unspecified: Secondary | ICD-10-CM

## 2017-10-11 DIAGNOSIS — I129 Hypertensive chronic kidney disease with stage 1 through stage 4 chronic kidney disease, or unspecified chronic kidney disease: Secondary | ICD-10-CM

## 2017-10-11 DIAGNOSIS — K4090 Unilateral inguinal hernia, without obstruction or gangrene, not specified as recurrent: Secondary | ICD-10-CM

## 2017-10-11 DIAGNOSIS — Z794 Long term (current) use of insulin: Secondary | ICD-10-CM

## 2017-10-11 DIAGNOSIS — Z951 Presence of aortocoronary bypass graft: Secondary | ICD-10-CM

## 2017-10-11 DIAGNOSIS — Z955 Presence of coronary angioplasty implant and graft: Secondary | ICD-10-CM

## 2017-10-11 DIAGNOSIS — C801 Malignant (primary) neoplasm, unspecified: Secondary | ICD-10-CM

## 2017-10-11 DIAGNOSIS — I25118 Atherosclerotic heart disease of native coronary artery with other forms of angina pectoris: Secondary | ICD-10-CM

## 2017-10-11 DIAGNOSIS — Z79899 Other long term (current) drug therapy: Secondary | ICD-10-CM

## 2017-10-11 DIAGNOSIS — E119 Type 2 diabetes mellitus without complications: Principal | ICD-10-CM

## 2017-10-11 DIAGNOSIS — Z01810 Encounter for preprocedural cardiovascular examination: Principal | ICD-10-CM

## 2017-10-11 DIAGNOSIS — I2510 Atherosclerotic heart disease of native coronary artery without angina pectoris: Secondary | ICD-10-CM

## 2017-10-11 DIAGNOSIS — I10 Essential (primary) hypertension: Secondary | ICD-10-CM

## 2017-10-11 DIAGNOSIS — Z7982 Long term (current) use of aspirin: Secondary | ICD-10-CM

## 2017-10-11 MED ORDER — ACETAMINOPHEN 500 MG PO TABS
1000 mg | Freq: Once | ORAL | Status: CN
Start: 2017-10-11 — End: ?

## 2017-10-11 MED ORDER — GABAPENTIN 300 MG PO CAPS
600 mg | Freq: Once | ORAL | Status: CN
Start: 2017-10-11 — End: ?

## 2017-10-11 MED ORDER — CEFAZOLIN SODIUM 1 G IJ SOLR
2 g | Freq: Once | INTRAVENOUS | Status: CN
Start: 2017-10-11 — End: ?

## 2017-10-11 NOTE — H&P (View-Only)
Reason for visit:    Gary Walters is a new 72 y.o. Caucasian male seen at the request of his primary care physician, Gearlean AlfHadzic, [name], MD for evaluation of New Patient (K40.90 (ICD-10-CM) - Left inguinal hernia // [employer] )  .     HPI:      Gary Walters is a 72 y.o. male here for evaluation of left inguinal hernia. States he just recently noticed a bulge in left groin about one month ago and made an appointment for evaluation. Reports left inguinal discomfort. Pain is worse with lifting heavy objects or coughing. States he is able to "push it back in". Denies n/v/f/c. Denies change in bowel movements. Denies constipation. PSx includes CABG 3 vessel (2015). He takes Plavix daily. [facility] (Ph. [phone] / fax [phone]). States he has follow up with his cardiologist within the next one month. He lives at home with his wife.           US Soft tissue Pelvis 10/03/17    History suspected left inguinal hernia   ?  Targeted sonography of the left inguinal region was performed  ?  Findings/impression:  ?  There is a left inguinal hernia present particularly on images acquired during Valsalva maneuver  ?  For further assessment suggest CT be considered  ?  Read By Levie Heritage- Derek Hamlin M.D.    Electronically Verified By Levie Heritage- Derek Hamlin M.D.    Released Date Time - 10/03/2017 2:21 PM    Resident -     Past Medical History:   Diagnosis Date   ? CAD (coronary artery disease)    ? Cancer (CMS-HCC code)    ? CKD (chronic kidney disease) 08/20/2014   ? Diabetes mellitus (CMS-HCC code)    ? Hypertension      Past Surgical History:   Procedure Laterality Date   ? CARDIAC CATHERIZATION     ? CERVICAL FUSION     ? CORONARY ANGIOPLASTY     ? CORONARY ARTERY BYPASS GRAFT      3 vessel     Family History   Problem Relation Age of Onset   ? Cancer Mother    ? Diabetes Father    ? Blindness Father    ? Cancer Sister    ? Cancer Brother      Social History     Social History   ? Marital status: Married

## 2017-10-11 NOTE — H&P (View-Only)
Spouse name: N/A   ? Number of children: N/A   ? Years of education: N/A     Occupational History   ? Not on file.     Social History Main Topics   ? Smoking status: Never Smoker   ? Smokeless tobacco: Never Used   ? Alcohol use Yes   ? Drug use: No   ? Sexual activity: Yes     Partners: Female     Other Topics Concern   ? Not on file     Social History Narrative   ? No narrative on file       Current Med List   Medication Sig   ? amLODIPine (NORVASC) 10 MG PO Tablet Take 5 mg (1/2 tablet) by mouth 2 times daily.   ? aspirin 81 MG PO Tablet Chewable Chew 1 tablet daily.   ? atorvastatin (LIPITOR) 40 MG Tablet TAKE ONE TABLET BY MOUTH ONCE DAILY   ? benzonatate (TESSALON) 100 MG PO Capsule Take 1 capsule by mouth 3 times daily as needed for cough.   ? Bromfenac Sodium (PROLENSA) 0.07 % OP Solution Prolensa 0.07 % eye drops   ? cetirizine (ZyrTEC) 10 MG Tablet Take 5 mg (1/2 tablet) by mouth daily.   ? cloNIDine (CATAPRES) 0.1 MG PO Tablet Take 1 tablet by mouth 2 times daily as needed for severe hypertension.   ? clopidogrel (PLAVIX) 75 MG PO Tablet TAKE ONE TABLET BY MOUTH ONCE DAILY   ? diclofenac (VOLTAREN GEL) 1 % TD Gel Apply 2 g topically 4 times daily. **Measure dose using enclosed dose card**   ? fluorouracil (EFUDEX) 5 % EX Cream    ? furosemide (LASIX) 20 MG PO Tablet Take 1 tablet by mouth daily.   ? gabapentin (NEURONTIN) 600 MG PO Tablet Take 1 tablet by mouth 3 times daily.   ? hydroCHLOROthiazide (HYDRODIURIL) 25 MG PO Tablet    ? [START ON 11/03/2017] HYDROcodone-acetaminophen (NORCO) 5-325 MG PO Tablet Take 1 tablet by mouth every 6 hours as needed for pain. Earliest Fill Date: 11/03/17   ? insulin aspart (NovoLOG) vial 100 units daily via insulin pump   ? ipratropium (ATROVENT) 0.06 % NA Solution 1 spray each nostril daily.   ? lidocaine (XYLOCAINE) 2 % EX Gel Apply topically as needed   ? lidocaine-prilocaine (EMLA) 2.5-2.5 % EX Cream Apply to feet once a day

## 2017-10-11 NOTE — Progress Notes
Teaching/Attestation Statement  I saw and evaluated the patient.  I reviewed the Nurse Practitioner note and agree with the assessment and plan. See Nurse Practitioner's note for details.LI[facility]. Plan open [facility].

## 2017-10-11 NOTE — H&P (View-Only)
?   losartan (COZAAR) 100 MG PO Tablet    ? losartan-hydroCHLOROthiazide (HYZAAR) 100-25 MG PO Tablet Take 50/12.5 mg (1/2 tablet) by mouth 2 times daily.   ? metFORMIN (GLUCOPHAGE) 1000 MG PO Tablet Take 1 tablet by mouth 2 times daily (with meals). SrCr: 1.60* mg/dl (16/1003/13 96041225) [last value]   ? metoprolol succinate (TOPROL-XL) 25 MG PO Tablet Extended Release 24 Hour TAKE ONE TABLET BY MOUTH TWICE DAILY   ? mupirocin (BACTROBAN) 2 % EX Cream Bactroban 2 % topical cream   APPLY A SMALL AMOUNT TO THE AFFECTED AREA BY TOPICAL ROUTE 3 TIMES PER DAY FOR 10 DAYS   ? omeprazole (PriLOSEC) 40 MG Capsule Delayed Release Take 1 capsule by mouth daily.   ? ONE TOUCH ULTRA TEST VI Strip 2 times daily (before breakfast and dinner).   ? sertraline (ZOLOFT) 50 MG PO Tablet Take 1 tablet by mouth daily.   ? sildenafil (VIAGRA) 100 MG PO Tablet Take 1 tablet by mouth as needed for erectile dysfunction.   ? tamsulosin (FLOMAX) 0.4 MG PO Capsule Take 1 capsule by mouth daily.       Allergies   Allergen Reactions   ? Nsaids Other (See Comments)     ?Azotemia       ROS:    Review of Systems   Constitutional: Negative.    HENT: Negative.    Eyes: Negative.    Respiratory: Negative.    Cardiovascular: Negative.    Gastrointestinal: Positive for abdominal pain (left inguinal pain).   Genitourinary: Negative.    Musculoskeletal: Negative.    Skin: Negative.    Neurological: Negative.    Endo/Heme/Allergies: Negative.    Psychiatric/Behavioral: Negative.      Vitals:      Vitals:    10/11/17 1335   BP: 117/73   Pulse: 64   Temp: 36 ?C (96.8 ?F)   Weight: 86.6 kg (191 lb)   Height: 1.778 m (5\' 10" )       Physical Exam:    Physical Exam   Constitutional: He is oriented to person, place, and time. He appears well-developed and well-nourished.   HENT:   Head: Normocephalic and atraumatic.   Eyes: Conjunctivae and EOM are normal.   Neck: Normal range of motion. Neck supple.   Cardiovascular: Normal rate.

## 2017-10-11 NOTE — H&P (View-Only)
Pulmonary/Chest: Effort normal. No respiratory distress.   Abdominal: Soft. He exhibits no distension. There is no tenderness. A hernia (left inguinal hernia) is present.   Musculoskeletal: Normal range of motion.   Neurological: He is alert and oriented to person, place, and time.   Skin: Skin is warm and dry.   Psychiatric: He has a normal mood and affect. His behavior is normal.   Nursing note and vitals reviewed.    Exam by Dr. Puri and chaperoned by Heather Williams APRN    Assessment and Plan:    Gary Walters was seen today for new patient.    Diagnoses and all orders for this visit:    Left inguinal hernia  -     Refer to General Surgery      Gary Walters is a 71 y.o. male here for evaluation of left inguinal hernia. On exam noted to have left inguinal hernia that is easily reducible. US reviewed and discussed with patient and findings consistent with left inguinal hernia. Recommend open left inguinal hernia repair. Discussed risks such as bleeding, infection, injury to vas deferens, injury to testicular blood supply, hernia recurrence, nerve entrapment, anesthesia complications, pulmonary embolism, heart attack, stroke, or death. Patient is agreeable.    Will need to stop Plavix for at least one week  Ok for low dose Aspirin  Will fax cardiology clearance letter to cardiologist   Will plan for open left inguinal hernia repair  No heavy lifting anything greater than 10 lbs for at least 4 weeks  Follow up post op    Heather Williams, APRN  10/11/2017  1:51 PM

## 2017-10-11 NOTE — H&P (View-Only)
?   losartan (COZAAR) 100 MG PO Tablet    ? losartan-hydroCHLOROthiazide (HYZAAR) 100-25 MG PO Tablet Take 50/12.5 mg (1/2 tablet) by mouth 2 times daily.   ? metFORMIN (GLUCOPHAGE) 1000 MG PO Tablet Take 1 tablet by mouth 2 times daily (with meals). SrCr: 1.60* mg/dl (03/13 1225) [last value]   ? metoprolol succinate (TOPROL-XL) 25 MG PO Tablet Extended Release 24 Hour TAKE ONE TABLET BY MOUTH TWICE DAILY   ? mupirocin (BACTROBAN) 2 % EX Cream Bactroban 2 % topical cream   APPLY A SMALL AMOUNT TO THE AFFECTED AREA BY TOPICAL ROUTE 3 TIMES PER DAY FOR 10 DAYS   ? omeprazole (PriLOSEC) 40 MG Capsule Delayed Release Take 1 capsule by mouth daily.   ? ONE TOUCH ULTRA TEST VI Strip 2 times daily (before breakfast and dinner).   ? sertraline (ZOLOFT) 50 MG PO Tablet Take 1 tablet by mouth daily.   ? sildenafil (VIAGRA) 100 MG PO Tablet Take 1 tablet by mouth as needed for erectile dysfunction.   ? tamsulosin (FLOMAX) 0.4 MG PO Capsule Take 1 capsule by mouth daily.       Allergies   Allergen Reactions   ? Nsaids Other (See Comments)     ?Azotemia       ROS:    Review of Systems   Constitutional: Negative.    HENT: Negative.    Eyes: Negative.    Respiratory: Negative.    Cardiovascular: Negative.    Gastrointestinal: Positive for abdominal pain (left inguinal pain).   Genitourinary: Negative.    Musculoskeletal: Negative.    Skin: Negative.    Neurological: Negative.    Endo/Heme/Allergies: Negative.    Psychiatric/Behavioral: Negative.      Vitals:      Vitals:    10/11/17 1335   BP: 117/73   Pulse: 64   Temp: 36 ?C (96.8 ?F)   Weight: 86.6 kg (191 lb)   Height: 1.778 m (5' 10")       Physical Exam:    Physical Exam   Constitutional: He is oriented to person, place, and time. He appears well-developed and well-nourished.   HENT:   Head: Normocephalic and atraumatic.   Eyes: Conjunctivae and EOM are normal.   Neck: Normal range of motion. Neck supple.   Cardiovascular: Normal rate.

## 2017-10-11 NOTE — H&P (View-Only)
Reason for visit:    Gary Walters is a new 72 y.o. Caucasian male seen at the request of his primary care physician, [name], [name], MD for evaluation of New Patient (K40.90 (ICD-10-CM) - Left inguinal hernia // [employer] )  .     HPI:      Gary Walters is a 72 y.o. male here for evaluation of left inguinal hernia. States he just recently noticed a bulge in left groin about one month ago and made an appointment for evaluation. Reports left inguinal discomfort. Pain is worse with lifting heavy objects or coughing. States he is able to "push it back in". Denies n/v/f/c. Denies change in bowel movements. Denies constipation. PSx includes CABG 3 vessel (2015). He takes Plavix daily. He sees Dr. Mikulic at [facility] (Ph. [phone] / fax [phone]). States he has follow up with his cardiologist within the next one month. He lives at home with his wife.           US Soft tissue Pelvis 10/03/17    History suspected left inguinal hernia   ?  Targeted sonography of the left inguinal region was performed  ?  Findings/impression:  ?  There is a left inguinal hernia present particularly on images acquired during Valsalva maneuver  ?  For further assessment suggest CT be considered  ?  Read By - Derek Hamlin M.D.    Electronically Verified By - Derek Hamlin M.D.    Released Date Time - 10/03/2017 2:21 PM    Resident -     Past Medical History:   Diagnosis Date   ? CAD (coronary artery disease)    ? Cancer (CMS-HCC code)    ? CKD (chronic kidney disease) 08/20/2014   ? Diabetes mellitus (CMS-HCC code)    ? Hypertension      Past Surgical History:   Procedure Laterality Date   ? CARDIAC CATHERIZATION     ? CERVICAL FUSION     ? CORONARY ANGIOPLASTY     ? CORONARY ARTERY BYPASS GRAFT      3 vessel     Family History   Problem Relation Age of Onset   ? Cancer Mother    ? Diabetes Father    ? Blindness Father    ? Cancer Sister    ? Cancer Brother      Social History     Social History   ? Marital status: Married

## 2017-10-11 NOTE — Progress Notes
Reason for visit:    Gary Walters is a new 72 y.o. Caucasian male seen at the request of his primary care physician, [name], [name], MD for evaluation of New Patient (K40.90 (ICD-10-CM) - Left inguinal hernia // [employer] )  .     HPI:      Gary Walters is a 72 y.o. male here for evaluation of left inguinal hernia. States he just recently noticed a bulge in left groin about one month ago and made an appointment for evaluation. Reports left inguinal discomfort. Pain is worse with lifting heavy objects or coughing. States he is able to "push it back in". Denies n/v/f/c. Denies change in bowel movements. Denies constipation. PSx includes CABG 3 vessel (2015). He takes Plavix daily. He sees Dr. Mikulic at [facility] (Ph. [phone] / fax [phone]). States he has follow up with his cardiologist within the next one month. He lives at home with his wife.           US Soft tissue Pelvis 10/03/17    History suspected left inguinal hernia   ?  Targeted sonography of the left inguinal region was performed  ?  Findings/impression:  ?  There is a left inguinal hernia present particularly on images acquired during Valsalva maneuver  ?  For further assessment suggest CT be considered  ?  Read By - Derek Hamlin M.D.    Electronically Verified By - Derek Hamlin M.D.    Released Date Time - 10/03/2017 2:21 PM    Resident -     Past Medical History:   Diagnosis Date   ? CAD (coronary artery disease)    ? Cancer (CMS-HCC code)    ? CKD (chronic kidney disease) 08/20/2014   ? Diabetes mellitus (CMS-HCC code)    ? Hypertension      Past Surgical History:   Procedure Laterality Date   ? CARDIAC CATHERIZATION     ? CERVICAL FUSION     ? CORONARY ANGIOPLASTY     ? CORONARY ARTERY BYPASS GRAFT      3 vessel     Family History   Problem Relation Age of Onset   ? Cancer Mother    ? Diabetes Father    ? Blindness Father    ? Cancer Sister    ? Cancer Brother      Social History     Social History   ? Marital status: Married

## 2017-10-11 NOTE — H&P (View-Only)
Pulmonary/Chest: Effort normal. No respiratory distress.   Abdominal: Soft. He exhibits no distension. There is no tenderness. A hernia (left inguinal hernia) is present.   Musculoskeletal: Normal range of motion.   Neurological: He is alert and oriented to person, place, and time.   Skin: Skin is warm and dry.   Psychiatric: He has a normal mood and affect. His behavior is normal.   Nursing note and vitals reviewed.    Exam by Dr. Randolm IdolPuri and chaperoned by Iven FinnHeather Williams APRN    Assessment and Plan:    Gary FlingPerrie Walters was seen today for new patient.    Diagnoses and all orders for this visit:    Left inguinal hernia  -     Refer to General Surgery      Gary Flingerrie Walters is a 72 y.o. male here for evaluation of left inguinal hernia. On exam noted to have left inguinal hernia that is easily reducible. US reviewed and discussed with patient and findings consistent with left inguinal hernia. Recommend open left inguinal hernia repair. Discussed risks such as bleeding, infection, injury to vas deferens, injury to testicular blood supply, hernia recurrence, nerve entrapment, anesthesia complications, pulmonary embolism, heart attack, stroke, or death. Patient is agreeable.    Will need to stop Plavix for at least one week  Ok for low dose Aspirin  Will fax cardiology clearance letter to cardiologist   Will plan for open left inguinal hernia repair  No heavy lifting anything greater than 10 lbs for at least 4 weeks  Follow up post op    Iven FinnHeather Williams, APRN  10/11/2017  1:51 PM

## 2017-10-11 NOTE — Progress Notes
Pulmonary/Chest: Effort normal. No respiratory distress.   Abdominal: Soft. He exhibits no distension. There is no tenderness. A hernia (left inguinal hernia) is present.   Musculoskeletal: Normal range of motion.   Neurological: He is alert and oriented to person, place, and time.   Skin: Skin is warm and dry.   Psychiatric: He has a normal mood and affect. His behavior is normal.   Nursing note and vitals reviewed.    Exam by Dr. Puri and chaperoned by Heather Williams APRN    Assessment and Plan:    Gary Walters was seen today for new patient.    Diagnoses and all orders for this visit:    Left inguinal hernia  -     Refer to General Surgery      Gary Walters is a 72 y.o. male here for evaluation of left inguinal hernia. On exam noted to have left inguinal hernia that is easily reducible. US reviewed and discussed with patient and findings consistent with left inguinal hernia. Recommend open left inguinal hernia repair. Discussed risks such as bleeding, infection, injury to vas deferens, injury to testicular blood supply, hernia recurrence, nerve entrapment, anesthesia complications, pulmonary embolism, heart attack, stroke, or death. Patient is agreeable.    Will need to stop Plavix for at least one week  Ok for low dose Aspirin  Will fax cardiology clearance letter to cardiologist   Will plan for open left inguinal hernia repair  No heavy lifting anything greater than 10 lbs for at least 4 weeks  Follow up post op    Heather Williams, APRN  10/11/2017  1:51 PM

## 2017-10-11 NOTE — Progress Notes
?   losartan (COZAAR) 100 MG PO Tablet    ? losartan-hydroCHLOROthiazide (HYZAAR) 100-25 MG PO Tablet Take 50/12.5 mg (1/2 tablet) by mouth 2 times daily.   ? metFORMIN (GLUCOPHAGE) 1000 MG PO Tablet Take 1 tablet by mouth 2 times daily (with meals). SrCr: 1.60* mg/dl (03/13 1225) [last value]   ? metoprolol succinate (TOPROL-XL) 25 MG PO Tablet Extended Release 24 Hour TAKE ONE TABLET BY MOUTH TWICE DAILY   ? mupirocin (BACTROBAN) 2 % EX Cream Bactroban 2 % topical cream   APPLY A SMALL AMOUNT TO THE AFFECTED AREA BY TOPICAL ROUTE 3 TIMES PER DAY FOR 10 DAYS   ? omeprazole (PriLOSEC) 40 MG Capsule Delayed Release Take 1 capsule by mouth daily.   ? ONE TOUCH ULTRA TEST VI Strip 2 times daily (before breakfast and dinner).   ? sertraline (ZOLOFT) 50 MG PO Tablet Take 1 tablet by mouth daily.   ? sildenafil (VIAGRA) 100 MG PO Tablet Take 1 tablet by mouth as needed for erectile dysfunction.   ? tamsulosin (FLOMAX) 0.4 MG PO Capsule Take 1 capsule by mouth daily.       Allergies   Allergen Reactions   ? Nsaids Other (See Comments)     ?Azotemia       ROS:    Review of Systems   Constitutional: Negative.    HENT: Negative.    Eyes: Negative.    Respiratory: Negative.    Cardiovascular: Negative.    Gastrointestinal: Positive for abdominal pain (left inguinal pain).   Genitourinary: Negative.    Musculoskeletal: Negative.    Skin: Negative.    Neurological: Negative.    Endo/Heme/Allergies: Negative.    Psychiatric/Behavioral: Negative.      Vitals:      Vitals:    10/11/17 1335   BP: 117/73   Pulse: 64   Temp: 36 ?C (96.8 ?F)   Weight: 86.6 kg (191 lb)   Height: 1.778 m (5' 10")       Physical Exam:    Physical Exam   Constitutional: He is oriented to person, place, and time. He appears well-developed and well-nourished.   HENT:   Head: Normocephalic and atraumatic.   Eyes: Conjunctivae and EOM are normal.   Neck: Normal range of motion. Neck supple.   Cardiovascular: Normal rate.

## 2017-10-11 NOTE — Progress Notes
Spouse name: N/A   ? Number of children: N/A   ? Years of education: N/A     Occupational History   ? Not on file.     Social History Main Topics   ? Smoking status: Never Smoker   ? Smokeless tobacco: Never Used   ? Alcohol use Yes   ? Drug use: No   ? Sexual activity: Yes     Partners: Female     Other Topics Concern   ? Not on file     Social History Narrative   ? No narrative on file       Current Med List   Medication Sig   ? amLODIPine (NORVASC) 10 MG PO Tablet Take 5 mg (1/2 tablet) by mouth 2 times daily.   ? aspirin 81 MG PO Tablet Chewable Chew 1 tablet daily.   ? atorvastatin (LIPITOR) 40 MG Tablet TAKE ONE TABLET BY MOUTH ONCE DAILY   ? benzonatate (TESSALON) 100 MG PO Capsule Take 1 capsule by mouth 3 times daily as needed for cough.   ? Bromfenac Sodium (PROLENSA) 0.07 % OP Solution Prolensa 0.07 % eye drops   ? cetirizine (ZyrTEC) 10 MG Tablet Take 5 mg (1/2 tablet) by mouth daily.   ? cloNIDine (CATAPRES) 0.1 MG PO Tablet Take 1 tablet by mouth 2 times daily as needed for severe hypertension.   ? clopidogrel (PLAVIX) 75 MG PO Tablet TAKE ONE TABLET BY MOUTH ONCE DAILY   ? diclofenac (VOLTAREN GEL) 1 % TD Gel Apply 2 g topically 4 times daily. **Measure dose using enclosed dose card**   ? fluorouracil (EFUDEX) 5 % EX Cream    ? furosemide (LASIX) 20 MG PO Tablet Take 1 tablet by mouth daily.   ? gabapentin (NEURONTIN) 600 MG PO Tablet Take 1 tablet by mouth 3 times daily.   ? hydroCHLOROthiazide (HYDRODIURIL) 25 MG PO Tablet    ? [START ON 11/03/2017] HYDROcodone-acetaminophen (NORCO) 5-325 MG PO Tablet Take 1 tablet by mouth every 6 hours as needed for pain. Earliest Fill Date: 11/03/17   ? insulin aspart (NovoLOG) vial 100 units daily via insulin pump   ? ipratropium (ATROVENT) 0.06 % NA Solution 1 spray each nostril daily.   ? lidocaine (XYLOCAINE) 2 % EX Gel Apply topically as needed   ? lidocaine-prilocaine (EMLA) 2.5-2.5 % EX Cream Apply to feet once a day

## 2017-10-11 NOTE — H&P (View-Only)
Spouse name: N/A   ? Number of children: N/A   ? Years of education: N/A     Occupational History   ? Not on file.     Social History Main Topics   ? Smoking status: Never Smoker   ? Smokeless tobacco: Never Used   ? Alcohol use Yes   ? Drug use: No   ? Sexual activity: Yes     Partners: Female     Other Topics Concern   ? Not on file     Social History Narrative   ? No narrative on file       Current Med List   Medication Sig   ? amLODIPine (NORVASC) 10 MG PO Tablet Take 5 mg (1/2 tablet) by mouth 2 times daily.   ? aspirin 81 MG PO Tablet Chewable Chew 1 tablet daily.   ? atorvastatin (LIPITOR) 40 MG Tablet TAKE ONE TABLET BY MOUTH ONCE DAILY   ? benzonatate (TESSALON) 100 MG PO Capsule Take 1 capsule by mouth 3 times daily as needed for cough.   ? Bromfenac Sodium (PROLENSA) 0.07 % OP Solution Prolensa 0.07 % eye drops   ? cetirizine (ZyrTEC) 10 MG Tablet Take 5 mg (1/2 tablet) by mouth daily.   ? cloNIDine (CATAPRES) 0.1 MG PO Tablet Take 1 tablet by mouth 2 times daily as needed for severe hypertension.   ? clopidogrel (PLAVIX) 75 MG PO Tablet TAKE ONE TABLET BY MOUTH ONCE DAILY   ? diclofenac (VOLTAREN GEL) 1 % TD Gel Apply 2 g topically 4 times daily. **Measure dose using enclosed dose card**   ? fluorouracil (EFUDEX) 5 % EX Cream    ? furosemide (LASIX) 20 MG PO Tablet Take 1 tablet by mouth daily.   ? gabapentin (NEURONTIN) 600 MG PO Tablet Take 1 tablet by mouth 3 times daily.   ? hydroCHLOROthiazide (HYDRODIURIL) 25 MG PO Tablet    ? [START ON 11/03/2017] HYDROcodone-acetaminophen (NORCO) 5-325 MG PO Tablet Take 1 tablet by mouth every 6 hours as needed for pain. Earliest Fill Date: 11/03/17   ? insulin aspart (NovoLOG) vial 100 units daily via insulin pump   ? ipratropium (ATROVENT) 0.06 % NA Solution 1 spray each nostril daily.   ? lidocaine (XYLOCAINE) 2 % EX Gel Apply topically as needed   ? lidocaine-prilocaine (EMLA) 2.5-2.5 % EX Cream Apply to feet once a day

## 2017-10-11 NOTE — Progress Notes
Appointment Date: Future Appointments  Date Time Provider Department Center   10/11/2017 1:30 PM Cyndi LennertPuri, Ruchir, MD [facility]   10/26/2017 2:15 PM Tawny AsalWashington, Johnny, MD [facility]   11/02/2017 2:20 PM Harlin Rainhehade, Joe M, MD [facility] END [facility]   11/16/2017 11:00 AM JN RESPIRATORY THERAPY [facility] RT [facility]   12/01/2017 10:00 AM [name], [name], MD DFP DUNN FAM [facility]   12/28/2017 1:30 PM [facility] ENDOCRINOLOG[facility] END [facility]   12/29/2017 10:40 AM Hatoum, Steele BergHadi H, MD NOR[facility]       PCP: Gary FleetHadzic, [name]    Chief Complaint:NPA K40.90 (ICD-10-CM) - Left inguinal hernia // [employer]     Allergies:   Allergies   Allergen Reactions   ? Nsaids Other (See Comments)     ?Azotemia       Pharmacy:   [facility] [address] - [place], [place] - [id number]  [address]  [place] [id number]  Phone: [phone] Fax: [phone]    [facility] [address] - [place], [place] - [address]  [address]  [place] [id number]  Phone: [phone] Fax: [phone]    [facility] [address] - [place], [place] - [address]  [id number]  [place] [id number]  Phone: [phone] Fax: [phone]    [facility] [address] - [place], [place] - [id number]  [address] [place]  [place] [id number]  Phone: [phone] Fax: [phone]      Checked By: Kandice RobinsonsAlysha Dorcelet, MA    Date: 10/11/2017

## 2017-10-13 DIAGNOSIS — M1990 Unspecified osteoarthritis, unspecified site: Secondary | ICD-10-CM

## 2017-10-13 DIAGNOSIS — E785 Hyperlipidemia, unspecified: Secondary | ICD-10-CM

## 2017-10-13 DIAGNOSIS — I739 Peripheral vascular disease, unspecified: Secondary | ICD-10-CM

## 2017-10-13 DIAGNOSIS — K219 Gastro-esophageal reflux disease without esophagitis: Secondary | ICD-10-CM

## 2017-10-13 DIAGNOSIS — C4491 Basal cell carcinoma of skin, unspecified: Secondary | ICD-10-CM

## 2017-10-13 DIAGNOSIS — Z9109 Other allergy status, other than to drugs and biological substances: Secondary | ICD-10-CM

## 2017-10-13 DIAGNOSIS — I2510 Atherosclerotic heart disease of native coronary artery without angina pectoris: Secondary | ICD-10-CM

## 2017-10-13 DIAGNOSIS — G629 Polyneuropathy, unspecified: Secondary | ICD-10-CM

## 2017-10-13 DIAGNOSIS — N189 Chronic kidney disease, unspecified: Secondary | ICD-10-CM

## 2017-10-13 DIAGNOSIS — Z8614 Personal history of Methicillin resistant Staphylococcus aureus infection: Secondary | ICD-10-CM

## 2017-10-13 DIAGNOSIS — F419 Anxiety disorder, unspecified: Secondary | ICD-10-CM

## 2017-10-13 DIAGNOSIS — M542 Cervicalgia: Secondary | ICD-10-CM

## 2017-10-13 DIAGNOSIS — Z9189 Other specified personal risk factors, not elsewhere classified: Secondary | ICD-10-CM

## 2017-10-13 DIAGNOSIS — I10 Essential (primary) hypertension: Principal | ICD-10-CM

## 2017-10-13 DIAGNOSIS — I219 Acute myocardial infarction, unspecified: Secondary | ICD-10-CM

## 2017-10-13 DIAGNOSIS — C439 Malignant melanoma of skin, unspecified: Secondary | ICD-10-CM

## 2017-10-13 DIAGNOSIS — E119 Type 2 diabetes mellitus without complications: Secondary | ICD-10-CM

## 2017-10-13 DIAGNOSIS — N400 Enlarged prostate without lower urinary tract symptoms: Secondary | ICD-10-CM

## 2017-10-19 DIAGNOSIS — R05 Cough: Principal | ICD-10-CM

## 2017-10-20 ENCOUNTER — Ambulatory Visit: Primary: Family Medicine

## 2017-10-20 DIAGNOSIS — G629 Polyneuropathy, unspecified: Secondary | ICD-10-CM

## 2017-10-20 DIAGNOSIS — I219 Acute myocardial infarction, unspecified: Secondary | ICD-10-CM

## 2017-10-20 DIAGNOSIS — Z7982 Long term (current) use of aspirin: Secondary | ICD-10-CM

## 2017-10-20 DIAGNOSIS — Z79899 Other long term (current) drug therapy: Secondary | ICD-10-CM

## 2017-10-20 DIAGNOSIS — M542 Cervicalgia: Secondary | ICD-10-CM

## 2017-10-20 DIAGNOSIS — E119 Type 2 diabetes mellitus without complications: Secondary | ICD-10-CM

## 2017-10-20 DIAGNOSIS — Z9189 Other specified personal risk factors, not elsewhere classified: Secondary | ICD-10-CM

## 2017-10-20 DIAGNOSIS — Z794 Long term (current) use of insulin: Secondary | ICD-10-CM

## 2017-10-20 DIAGNOSIS — C4491 Basal cell carcinoma of skin, unspecified: Secondary | ICD-10-CM

## 2017-10-20 DIAGNOSIS — Z9109 Other allergy status, other than to drugs and biological substances: Secondary | ICD-10-CM

## 2017-10-20 DIAGNOSIS — I10 Essential (primary) hypertension: Principal | ICD-10-CM

## 2017-10-20 DIAGNOSIS — N189 Chronic kidney disease, unspecified: Secondary | ICD-10-CM

## 2017-10-20 DIAGNOSIS — M1990 Unspecified osteoarthritis, unspecified site: Secondary | ICD-10-CM

## 2017-10-20 DIAGNOSIS — F419 Anxiety disorder, unspecified: Secondary | ICD-10-CM

## 2017-10-20 DIAGNOSIS — E785 Hyperlipidemia, unspecified: Secondary | ICD-10-CM

## 2017-10-20 DIAGNOSIS — C439 Malignant melanoma of skin, unspecified: Secondary | ICD-10-CM

## 2017-10-20 DIAGNOSIS — E1122 Type 2 diabetes mellitus with diabetic chronic kidney disease: Secondary | ICD-10-CM

## 2017-10-20 DIAGNOSIS — N400 Enlarged prostate without lower urinary tract symptoms: Secondary | ICD-10-CM

## 2017-10-20 DIAGNOSIS — I739 Peripheral vascular disease, unspecified: Secondary | ICD-10-CM

## 2017-10-20 DIAGNOSIS — Z955 Presence of coronary angioplasty implant and graft: Secondary | ICD-10-CM

## 2017-10-20 DIAGNOSIS — I2510 Atherosclerotic heart disease of native coronary artery without angina pectoris: Secondary | ICD-10-CM

## 2017-10-20 DIAGNOSIS — I129 Hypertensive chronic kidney disease with stage 1 through stage 4 chronic kidney disease, or unspecified chronic kidney disease: Secondary | ICD-10-CM

## 2017-10-20 DIAGNOSIS — K4090 Unilateral inguinal hernia, without obstruction or gangrene, not specified as recurrent: Principal | ICD-10-CM

## 2017-10-20 DIAGNOSIS — K219 Gastro-esophageal reflux disease without esophagitis: Secondary | ICD-10-CM

## 2017-10-20 DIAGNOSIS — Z951 Presence of aortocoronary bypass graft: Secondary | ICD-10-CM

## 2017-10-20 MED ORDER — DEXTRAN 70-HYPROMELLOSE 0.1-0.3 % OP SOLN
1 [drp] | Freq: Every day | OPHTHALMIC
Start: 2017-10-20 — End: 2018-06-27

## 2017-10-21 ENCOUNTER — Inpatient Hospital Stay: Admit: 2017-10-21 | Discharge: 2017-10-22 | Primary: Family Medicine

## 2017-10-21 DIAGNOSIS — R05 Cough: Principal | ICD-10-CM

## 2017-10-21 DIAGNOSIS — Z951 Presence of aortocoronary bypass graft: Secondary | ICD-10-CM

## 2017-10-21 DIAGNOSIS — Z8781 Personal history of (healed) traumatic fracture: Secondary | ICD-10-CM

## 2017-10-21 DIAGNOSIS — G8929 Other chronic pain: Secondary | ICD-10-CM

## 2017-10-21 DIAGNOSIS — M25561 Pain in right knee: Principal | ICD-10-CM

## 2017-10-21 DIAGNOSIS — M25562 Pain in left knee: Secondary | ICD-10-CM

## 2017-10-26 ENCOUNTER — Encounter: Attending: Orthopaedic Surgery | Primary: Family Medicine

## 2017-10-31 ENCOUNTER — Ambulatory Visit

## 2017-10-31 ENCOUNTER — Inpatient Hospital Stay

## 2017-10-31 DIAGNOSIS — Z794 Long term (current) use of insulin: Secondary | ICD-10-CM

## 2017-10-31 DIAGNOSIS — Z79899 Other long term (current) drug therapy: Secondary | ICD-10-CM

## 2017-10-31 DIAGNOSIS — I2510 Atherosclerotic heart disease of native coronary artery without angina pectoris: Secondary | ICD-10-CM

## 2017-10-31 DIAGNOSIS — E785 Hyperlipidemia, unspecified: Secondary | ICD-10-CM

## 2017-10-31 DIAGNOSIS — I739 Peripheral vascular disease, unspecified: Secondary | ICD-10-CM

## 2017-10-31 DIAGNOSIS — Z9189 Other specified personal risk factors, not elsewhere classified: Secondary | ICD-10-CM

## 2017-10-31 DIAGNOSIS — Z955 Presence of coronary angioplasty implant and graft: Secondary | ICD-10-CM

## 2017-10-31 DIAGNOSIS — M542 Cervicalgia: Secondary | ICD-10-CM

## 2017-10-31 DIAGNOSIS — G629 Polyneuropathy, unspecified: Secondary | ICD-10-CM

## 2017-10-31 DIAGNOSIS — E1122 Type 2 diabetes mellitus with diabetic chronic kidney disease: Secondary | ICD-10-CM

## 2017-10-31 DIAGNOSIS — K4090 Unilateral inguinal hernia, without obstruction or gangrene, not specified as recurrent: Principal | ICD-10-CM

## 2017-10-31 DIAGNOSIS — N400 Enlarged prostate without lower urinary tract symptoms: Secondary | ICD-10-CM

## 2017-10-31 DIAGNOSIS — N189 Chronic kidney disease, unspecified: Secondary | ICD-10-CM

## 2017-10-31 DIAGNOSIS — I219 Acute myocardial infarction, unspecified: Secondary | ICD-10-CM

## 2017-10-31 DIAGNOSIS — Z9109 Other allergy status, other than to drugs and biological substances: Secondary | ICD-10-CM

## 2017-10-31 DIAGNOSIS — I10 Essential (primary) hypertension: Principal | ICD-10-CM

## 2017-10-31 DIAGNOSIS — C4491 Basal cell carcinoma of skin, unspecified: Secondary | ICD-10-CM

## 2017-10-31 DIAGNOSIS — E119 Type 2 diabetes mellitus without complications: Secondary | ICD-10-CM

## 2017-10-31 DIAGNOSIS — Z7982 Long term (current) use of aspirin: Secondary | ICD-10-CM

## 2017-10-31 DIAGNOSIS — M1990 Unspecified osteoarthritis, unspecified site: Secondary | ICD-10-CM

## 2017-10-31 DIAGNOSIS — I129 Hypertensive chronic kidney disease with stage 1 through stage 4 chronic kidney disease, or unspecified chronic kidney disease: Secondary | ICD-10-CM

## 2017-10-31 DIAGNOSIS — C439 Malignant melanoma of skin, unspecified: Secondary | ICD-10-CM

## 2017-10-31 DIAGNOSIS — Z951 Presence of aortocoronary bypass graft: Secondary | ICD-10-CM

## 2017-10-31 DIAGNOSIS — F419 Anxiety disorder, unspecified: Secondary | ICD-10-CM

## 2017-10-31 DIAGNOSIS — K219 Gastro-esophageal reflux disease without esophagitis: Secondary | ICD-10-CM

## 2017-10-31 MED ORDER — OXYCODONE HCL 5 MG PO TABS
10 mg | Freq: Once | ORAL | Status: DC | PRN
Start: 2017-10-31 — End: 2017-10-31

## 2017-10-31 MED ORDER — ACETAMINOPHEN 500 MG PO TABS
1000 mg | Freq: Once | ORAL | Status: CP
Start: 2017-10-31 — End: ?

## 2017-10-31 MED ORDER — ONDANSETRON HCL 4 MG/2ML IJ SOLN
Status: DC | PRN
Start: 2017-10-31 — End: 2017-10-31

## 2017-10-31 MED ORDER — FENTANYL CITRATE INJ 50 MCG/ML CUSTOM AMP/VIAL
50 ug | INTRAVENOUS | Status: DC | PRN
Start: 2017-10-31 — End: 2017-10-31

## 2017-10-31 MED ORDER — NALOXONE HCL 2 MG/2ML IJ SOSY
.4 mg | INTRAVENOUS | Status: DC | PRN
Start: 2017-10-31 — End: 2017-10-31

## 2017-10-31 MED ORDER — PROMETHAZINE HCL 25 MG/ML IJ SOLN
6.25 mg | INTRAVENOUS | Status: DC | PRN
Start: 2017-10-31 — End: 2017-10-31

## 2017-10-31 MED ORDER — OXYCODONE-ACETAMINOPHEN 5-325 MG PO TABS
1-2 | ORAL | 0 refills | Status: CP | PRN
Start: 2017-10-31 — End: ?

## 2017-10-31 MED ORDER — BACITRACIN 50000 UNITS IM SOLR
Status: DC | PRN
Start: 2017-10-31 — End: 2017-10-31

## 2017-10-31 MED ORDER — FENTANYL CITRATE INJ 50 MCG/ML CUSTOM AMP/VIAL
Status: DC | PRN
Start: 2017-10-31 — End: 2017-10-31

## 2017-10-31 MED ORDER — GABAPENTIN 300 MG PO CAPS
600 mg | Freq: Once | ORAL | Status: CP
Start: 2017-10-31 — End: ?

## 2017-10-31 MED ORDER — EPHEDRINE SULFATE-NACL (PF) 5-0.9 MG/ML-% IV SOSY UF
Status: DC | PRN
Start: 2017-10-31 — End: 2017-10-31

## 2017-10-31 MED ORDER — BUPIVACAINE-EPINEPHRINE (PF) 0.5% -1:200000 IJ SOLN
Status: DC | PRN
Start: 2017-10-31 — End: 2017-10-31

## 2017-10-31 MED ORDER — ONDANSETRON HCL 4 MG/2ML IJ SOLN
4 mg | INTRAVENOUS | Status: DC | PRN
Start: 2017-10-31 — End: 2017-10-31

## 2017-10-31 MED ORDER — HYDROMORPHONE HCL 1 MG/ML IJ SOLN
.5 mg | INTRAVENOUS | Status: DC | PRN
Start: 2017-10-31 — End: 2017-10-31

## 2017-10-31 MED ORDER — DEXAMETHASONE SODIUM PHOSPHATE 4 MG/ML CUSTOM COMPONENT JX
Status: DC | PRN
Start: 2017-10-31 — End: 2017-10-31

## 2017-10-31 MED ORDER — LACTATED RINGERS IV SOLN
Freq: Once | INTRAVENOUS | Status: CP
Start: 2017-10-31 — End: ?

## 2017-10-31 MED ORDER — LIDOCAINE HCL (PF) 1 % IJ SOLN SH
INTRAVENOUS | Status: DC | PRN
Start: 2017-10-31 — End: 2017-10-31

## 2017-10-31 MED ORDER — CEFAZOLIN SODIUM 1 G IJ SOLR
2 g | Freq: Once | INTRAVENOUS | Status: CP
Start: 2017-10-31 — End: ?

## 2017-10-31 MED ORDER — DIPHENHYDRAMINE HCL 50 MG/ML IJ SOLN
12.5 mg | INTRAVENOUS | Status: DC | PRN
Start: 2017-10-31 — End: 2017-10-31

## 2017-10-31 MED ORDER — PROPOFOL 10 MG/ML IV EMUL CUSTOM SH
Status: DC | PRN
Start: 2017-10-31 — End: 2017-10-31

## 2017-10-31 MED ORDER — LACTATED RINGERS IV SOLN
Status: DC | PRN
Start: 2017-10-31 — End: 2017-10-31

## 2017-10-31 NOTE — Anesthesia Preprocedure Evaluation
Endo/Other: Negative for endo/other ROS except otherwise noted diabetes and malignancy. The patient uses Insulin pump and Oral hypoglycemics to control the diabetes. The patient has a history of osteoarthritis.     Melanoma and BCCa; A1C 9.2 (05/05/17)

## 2017-10-31 NOTE — Op Note
aponeurosis was divided with the Bovie. The hernia sac was easily visible. Ilioinguinal nerve was visualized during dissection. A left sided inguinal hernia was noted. The hernia sac and cord structures were dissected free using blunt and bovie dissection and encircled with a Penrose drain. The sac was skeletonized and dissected free from the vas deferens and testicular vessels. The cremaster muscle was dissected free and excised. The sac was now opened and inspected and contained omentum, no colon was noted. There was inflammatory fluid present around the sac but no serosanguinous fluid was present to suggest strangulation and hence I did not feel the need to explore the abdomen. The base of the sac was transfixed with 2-0 Vicryl and the excess sac was excised being careful not to injury the bowel. This was then replaced back into the peritoneal cavity via the deep ring. The fascia transversalis was imbricated with multiple interrupted 3-0 Vicryl sutures to reinforce the posterior wall of the inguinal canal. A 10 x 4.5cm cm light weight polypropylene mesh was placed over the floor of the inguinal canal. The apex of the mesh was anchored to the pubic tubercle with a 2-0 Prolene suture. The lower part of the mesh was anchored to the shelving edge of the inguinal ligament and the superior aspect to the cojoined tendon using interrupted 2-0 Prolene sutures and a tension free Lichtenstein's type repair was hence performed. The tails of the mesh were then anchored behind the cord structures and the mesh was noted to lay snug on the floor of the canal. Hemostasis was noted to be adequate. The external oblique aponeurosis was then reapproximated using a running 2-0 Vicryl suture upto the external ring. The wound was then closed in layers using 3-0 Vicryl for Scarpa's fascia, 3-0 interrupted, inverted Vicryl for the dermis and a running 4-0 subcuticular Vicryl for the skin. Demabond was applied to the wound. The

## 2017-10-31 NOTE — Op Note
aponeurosis was divided with the Bovie. The hernia sac was easily visible. Ilioinguinal nerve was visualized during dissection, it wsa suture ligated and divided. Iliohypogastric nerve was preserved. A left sided indirect inguinal hernia was noted. The hernia sac and cord structures were dissected free using blunt and bovie dissection and encircled with a Penrose drain. The sac was skeletonized and dissected free from the vas deferens and testicular vessels. The cremaster muscle was dissected free and excised. The sac was now opened and inspected and was empty. The base of the sac was transfixed with 2-0 Vicryl and the excess sac was excised being careful not to injury the bowel. This was then replaced back into the peritoneal cavity via the deep ring. The fascia transversalis was imbricated with multiple interrupted 3-0 Vicryl sutures to reinforce the posterior wall of the inguinal canal. A 10 x 4.5cm cm medium weight polypropylene mesh with a keyhole was placed over the floor of the inguinal canal. The apex of the mesh was anchored to the pubic tubercle with a 2-0 Prolene suture. The lower part of the mesh was anchored to the shelving edge of the inguinal ligament and the superior aspect to the cojoined tendon using interrupted 2-0 Prolene sutures and a tension free Lichtenstein's type repair was hence performed. The tails of the mesh were then anchored behind the cord structures and the mesh was noted to lay snug on the floor of the canal. Hemostasis was noted to be adequate. The external oblique aponeurosis was then reapproximated using a running 2-0 Vicryl suture upto the external ring. The wound was then closed in layers using 3-0 Vicryl for Scarpa's fascia, 3-0 interrupted, inverted Vicryl for the dermis and a running 4-0 subcuticular Vicryl for the skin. Dermabond was applied to the wound. The patient tolerated the procedure well without

## 2017-10-31 NOTE — Op Note
aponeurosis was divided with the Bovie. The hernia sac was easily visible. Ilioinguinal nerve was visualized during dissection, it wsa suture ligated and divided. Iliohypogastric nerve was preserved. A left sided indirect inguinal hernia was noted. The hernia sac and cord structures were dissected free using blunt and bovie dissection and encircled with a Penrose drain. The sac was skeletonized and dissected free from the vas deferens and testicular vessels. The cremaster muscle was dissected free and excised. The sac was now opened and inspected and was empty. The base of the sac was transfixed with 2-0 Vicryl and the excess sac was excised being careful not to injury the bowel. This was then replaced back into the peritoneal cavity via the deep ring. The fascia transversalis was imbricated with multiple interrupted 3-0 Vicryl sutures to reinforce the posterior wall of the inguinal canal. A 10 x 4.5cm cm medium weight polypropylene mesh with a keyhole was placed over the floor of the inguinal canal. The apex of the mesh was anchored to the pubic tubercle with a 2-0 Prolene suture. The lower part of the mesh was anchored to the shelving edge of the inguinal ligament and the superior aspect to the cojoined tendon using interrupted 2-0 Prolene sutures and a tension free Lichtenstein's type repair was hence performed. The tails of the mesh were then anchored behind the cord structures and the mesh was noted to lay snug on the floor of the canal. Hemostasis was noted to be adequate. The external oblique aponeurosis was then reapproximated using a running 2-0 Vicryl suture upto the external ring. The wound was then closed in layers using 3-0 Vicryl for Scarpa's fascia, 3-0 interrupted, inverted Vicryl for the dermis and a running 4-0 subcuticular Vicryl for the skin. Dermabond was applied to the wound. The patient tolerated the procedure well without

## 2017-10-31 NOTE — Op Note
any immediate complications. Sharps, sponge, needle and instrument counts were noted to be correct x 2. The patient was extubated and taken to the recovery room in a stable condition. The family was updated and all questions were answered.   ?

## 2017-10-31 NOTE — Progress Notes
Received pt from the OR, asleep on simple mask. Placed on monitor.VSS at this time. Report received from anesthesiologist and Art therapistAmber RN. Safety measures in place. Assessment ongoing

## 2017-10-31 NOTE — Op Note
aponeurosis was divided with the Bovie. The hernia sac was easily visible. Ilioinguinal nerve was visualized during dissection. A left sided inguinal hernia was noted. The hernia sac and cord structures were dissected free using blunt and bovie dissection and encircled with a Penrose drain. The sac was skeletonized and dissected free from the vas deferens and testicular vessels. The cremaster muscle was dissected free and excised. The sac was now opened and inspected and contained omentum, no colon was noted. There was inflammatory fluid present around the sac but no serosanguinous fluid was present to suggest strangulation and hence I did not feel the need to explore the abdomen. The base of the sac was transfixed with 2-0 Vicryl and the excess sac was excised being careful not to injury the bowel. This was then replaced back into the peritoneal cavity via the deep ring. The fascia transversalis was imbricated with multiple interrupted 3-0 Vicryl sutures to reinforce the posterior wall of the inguinal canal. A 10 x 4.5cm cm light weight polypropylene mesh was placed over the floor of the inguinal canal. The apex of the mesh was anchored to the pubic tubercle with a 2-0 Prolene suture. The lower part of the mesh was anchored to the shelving edge of the inguinal ligament and the superior aspect to the cojoined tendon using interrupted 2-0 Prolene sutures and a tension free Lichtenstein's type repair was hence performed. The tails of the mesh were then anchored behind the cord structures and the mesh was noted to lay snug on the floor of the canal. Hemostasis was noted to be adequate. The external oblique aponeurosis was then reapproximated using a running 2-0 Vicryl suture upto the external ring. The wound was then closed in layers using 3-0 Vicryl for Scarpa's fascia, 3-0 interrupted, inverted Vicryl for the dermis and a running 4-0 subcuticular Vicryl for the skin. Demabond was applied to the wound. The

## 2017-10-31 NOTE — Interval H&P Note
Department of General Surgery  Pre-Op Interval History and Physical Note    10/31/2017 7:14 AM    Preoperative Diagnosis: Left inguinal hernia [K40.90]    Procedure: Procedure(s):  OPEN - INITIAL INGUINAL HERNIA, GREATER THAN 87YRS, REDUCIBLE (Left)    Surgeon: Moishe SpiceSurgeon(s) and Role:     Cyndi Lennert* Puri, Ruchir, MD - Primary    H&P documentation: I have examined the patient and there have been no interval changes in the patient's medical condition since the H&P was done.      [address]str[address] Renne MuscaVan Den Bruele, MD  4/8/20197:14 AM

## 2017-10-31 NOTE — Op Note
[facility]  Operative Note      Case Record #: [id number]  Gary Walters  06/14/1946, 71 y.o., male    Case Date: 10/31/2017    Staff:  Surgeon(s) and Role:     * Botty Van Den Bruele, Astrid, MD - Resident - Assisting     * Puri, Ruchir, MD - Primary  Anesthesiologist: Dasika, Jayanth, MD      Pre-Op Diagnosis:   Left inguinal hernia [K40.90]    Procedure(s):  OPEN - INITIAL INGUINAL HERNIA, GREATER THAN 5YRS, REDUCIBLE (Left)    Post-Op Diagnosis Codes:     * Left inguinal hernia [K40.90]      Teaching MD Documentation  1. A Resident/ACGME fellow participated in this case: Yes   2. Participating Resident/ACGME fellow was qualified for case type: Yes    3. Teaching surgeon is present entire case (including opening & closing): Yes    Specimens:     SPECIMENS     ID Source Type Lat. Tests [id number] Indication F/Z? Collect By Collect Date/Time Destination Time in Formalin ClinicalHx Nurse    A Hernia sac In Formalin Left ? SURG PATH SPECIMEN   Pathology (Lab5114) No Puri, Ruchir, MD 10/31/17 0921 OR Specimen Refrigerator (then to Pathology)  left inguinal hernia [name], [name]    Description: Hernia Sac left inguinal          SCD's applied: yes    EBL: 5 mls    INDICATION FOR PROCEDURE  Pt is a 71 y/o M who presented to clinic for evaluation of a left inguinal hernia. The exam was consistent with left inguinal hernia. Risks and benefits of the procedure were discussed in detail and a written informed consent was signed. All questions were answered.     DESCRIPTION OF PROCEDURE  The patient was brought to OR 1 and placed in a supine position. GETA was administered. SCDs were applied before induction. The lower abdomen was prepped and draped in the usual sterile fashion. After an appropriate time out an incision was made above and parallel to the inguinal ligament. The tissues were dissected down with a Bovie cautery. The external oblique

## 2017-10-31 NOTE — Anesthesia Preprocedure Evaluation
Endo/Other: Negative for endo/other ROS except otherwise noted diabetes and malignancy. The patient uses Insulin pump and Oral hypoglycemics to control the diabetes. The patient has a history of osteoarthritis.     Melanoma and BCCa; A1C 9.2 (05/05/17)    Physical Exam:  Airway: Mallampati score of II.   Pulmonary:  On pulmonary examination breath sounds clear to auscultation.   Cardiovascular: On cardiovascular examination regular rate and rhythm.     Anesthesia Plan:  Patient has an ASA score of 3 and patient is NPO. Plan for anesthesia technique is general.  With an intravenous type of induction. Anesthetic plan and risks has been discussed with the patient.     I have performed a pre-anesthesia examination, evaluation, and prescribed the anesthesia plan.

## 2017-10-31 NOTE — Progress Notes
Pt sitting up, awake and alert. Drinking water and tolerating. Denies pain/PONV at this time

## 2017-10-31 NOTE — Op Note
[facility]  [facility]      Case Record #: [id number]  Gary Walters  28-Oct-1945, 72 y.o., male    Case Date: 10/31/2017    Staff:  Surgeon(s) and Role:     * Botty Terrilee FilesVan Den Bruele, Astrid, MD - Resident - Assisting     * Cyndi LennertPuri, Ruchir, MD - Primary  Anesthesiologist: Orvil Feilasika, Jayanth, MD      Pre-Op Diagnosis:   Left inguinal hernia [K40.90]    Procedure(s):  OPEN - INITIAL INGUINAL HERNIA, GREATER THAN 50YRS, REDUCIBLE (Left)    Post-Op Diagnosis Codes:     * Left inguinal hernia [K40.90]      Teaching MD Documentation  1. A Resident/ACGME fellow participated in this case: Yes   2. Participating Resident/ACGME fellow was qualified for case type: Yes    3. Teaching surgeon is present entire case (including opening & closing): Yes    Specimens:     SPECIMENS     ID Source Type Lat. Tests [id number] Indication F/Z? Collect By [employer] OilCollect Date/Time Destination Time in Formalin ClinicalHx Nurse    A Hernia sac In Formalin Left ? SURG PATH SPECIMEN   Pathology [id number](Lab5114) No Cyndi LennertPuri, Ruchir, MD 10/31/17 [phone] OR Specimen Refrigerator (then to Pathology)  left inguinal hernia [name], [name]    Description: Hernia Sac left inguinal          SCD's applied: yes    EBL: 5 mls    INDICATION FOR PROCEDURE  Pt is a 72 y/o M who presented to clinic for evaluation of a left inguinal hernia. The exam was consistent with left inguinal hernia. Risks and benefits of the procedure were discussed in detail and a written informed consent was signed. All questions were answered.     DESCRIPTION OF PROCEDURE  The patient was brought to OR 1 and placed in a supine position. GETA was administered. SCDs were applied before induction. The lower abdomen was prepped and draped in the usual sterile fashion. After an appropriate time out an incision was made above and parallel to the inguinal ligament. The tissues were dissected down with a Bovie cautery. The external oblique

## 2017-10-31 NOTE — Interval H&P Note
Department of General Surgery  Pre-Op Interval History and Physical Note    10/31/2017 7:14 AM    Preoperative Diagnosis: Left inguinal hernia [K40.90]    Procedure: Procedure(s):  OPEN - INITIAL INGUINAL HERNIA, GREATER THAN 5YRS, REDUCIBLE (Left)    Surgeon: Surgeon(s) and Role:     * Puri, Ruchir, MD - Primary    H&P documentation: I have examined the patient and there have been no interval changes in the patient's medical condition since the H&P was done.      Astrid Botty Van Den Bruele, MD  4/8/20197:14 AM

## 2017-10-31 NOTE — Op Note
patient tolerated the procedure well without any immediate complications. Sharps, sponge, needle and instrument counts were noted to be correct x 2. The foley catheter was removed, the patient was extubated and taken to the recovery room in a stable condition. The family was updated and all questions were answered.   ?

## 2017-10-31 NOTE — Progress Notes
Received pt from the OR, asleep on simple mask. Placed on monitor.VSS at this time. Report received from anesthesiologist and Amber RN. Safety measures in place. Assessment ongoing

## 2017-10-31 NOTE — Anesthesia Postprocedure Evaluation
Post Anesthesia Eval    Respiratory function appropriate( normal respiratory rate and oxygen saturation, airway patent )  Cardiovascular function appropriate( normal heart rate and blood pressure )  Mental status appropriate  Patient normothermic  Pain well controlled  No uncontrolled nausea and vomiting  Patient appropriately hydrated  No apparent anesthesia complications  Patient tolerated procedure well

## 2017-10-31 NOTE — Op Note
patient tolerated the procedure well without any immediate complications. Sharps, sponge, needle and instrument counts were noted to be correct x 2. The foley catheter was removed, the patient was extubated and taken to the recovery room in a stable condition. The family was updated and all questions were answered.   ?

## 2017-10-31 NOTE — Progress Notes
Pt sitting up, awake and alert. Drinking water and tolerating. Denies pain/PONV at this time

## 2017-10-31 NOTE — Anesthesia Preprocedure Evaluation
Department of Anesthesiology  Pre-operative Evaluation Record    Patient Name: Gary Walters                  Sex: male                  Age: 72 y.o.                  MRN: [id number]     Allergies:   Nsaids    Current Med List   Medication Sig   ? Dextran 70-Hypromellose (artificial tears PF) 0.1-0.3 % OP Solution Apply 1 drop to eye(s) daily.       Prior Surgeries:     Past Surgical History:   Procedure Laterality Date   ? CERVICAL FUSION  1999   ? CERVICAL FUSION  2015    to repair damage during extubation following CABG   ? COLONOSCOPY  04/2017   ? CORONARY ANGIOPLASTY WITH STENT PLACEMENT  05/2015    SVG to OM1 and distal PDA   ? CORONARY ARTERY BYPASS GRAFT  2015    x3   ? 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 Never Smoker   Smokeless Tobacco   ? Never Used     Drugs:   History   Drug Use No       Evaluation:   ROS / Med Hx:  Negative for review of systems and medical history except otherwise noted below:    Pulmonary: Allergies   Neuro/Psych: Original cervical Fusion 1999, pt had traumatic extubation following CABG 2015 requiring redo fusion and has limited ROM with extension and is VERY ANXIOUS about being intubated/extubated (requesting LMA if possible).   Cardiovascular: Negative cardio ROS except otherwise noted. Cardiac history includes: hyperlipidemia, hypertension and past MI. The hypertension is well controlled. The hypertension has occurred for > 10 years. The MI occurred on: > 1 year. Treatment included cardiac stents, coronary artery bypass graft, antiplatelet therapy, beta blocker therapy, lipid-lowering statin medication prescribed and thrombolytic therapy. The patient has CAD history. Cardiac risks include: CAD. Cardiac risk index score: 1. Stent SVG to OM1, distal PDA 05/2015.   GI/Hepatic: Negative for GI/hepatic ROS except otherwise noted. acid reflux. food triggered GERD.   Renal: BPH

## 2017-10-31 NOTE — Op Note
any immediate complications. Sharps, sponge, needle and instrument counts were noted to be correct x 2. The patient was extubated and taken to the recovery room in a stable condition. The family was updated and all questions were answered.   ?

## 2017-11-01 DIAGNOSIS — I2510 Atherosclerotic heart disease of native coronary artery without angina pectoris: Secondary | ICD-10-CM

## 2017-11-01 DIAGNOSIS — F419 Anxiety disorder, unspecified: Secondary | ICD-10-CM

## 2017-11-01 DIAGNOSIS — C4491 Basal cell carcinoma of skin, unspecified: Secondary | ICD-10-CM

## 2017-11-01 DIAGNOSIS — C439 Malignant melanoma of skin, unspecified: Secondary | ICD-10-CM

## 2017-11-01 DIAGNOSIS — I10 Essential (primary) hypertension: Principal | ICD-10-CM

## 2017-11-01 DIAGNOSIS — E119 Type 2 diabetes mellitus without complications: Secondary | ICD-10-CM

## 2017-11-01 DIAGNOSIS — I739 Peripheral vascular disease, unspecified: Secondary | ICD-10-CM

## 2017-11-01 DIAGNOSIS — M542 Cervicalgia: Secondary | ICD-10-CM

## 2017-11-01 DIAGNOSIS — Z9109 Other allergy status, other than to drugs and biological substances: Secondary | ICD-10-CM

## 2017-11-01 DIAGNOSIS — K219 Gastro-esophageal reflux disease without esophagitis: Secondary | ICD-10-CM

## 2017-11-01 DIAGNOSIS — Z9189 Other specified personal risk factors, not elsewhere classified: Secondary | ICD-10-CM

## 2017-11-01 DIAGNOSIS — N400 Enlarged prostate without lower urinary tract symptoms: Secondary | ICD-10-CM

## 2017-11-01 DIAGNOSIS — I219 Acute myocardial infarction, unspecified: Secondary | ICD-10-CM

## 2017-11-01 DIAGNOSIS — N189 Chronic kidney disease, unspecified: Secondary | ICD-10-CM

## 2017-11-01 DIAGNOSIS — M1990 Unspecified osteoarthritis, unspecified site: Secondary | ICD-10-CM

## 2017-11-01 DIAGNOSIS — G629 Polyneuropathy, unspecified: Secondary | ICD-10-CM

## 2017-11-01 DIAGNOSIS — E785 Hyperlipidemia, unspecified: Secondary | ICD-10-CM

## 2017-11-02 ENCOUNTER — Encounter: Attending: "Endocrinology | Primary: Family Medicine

## 2017-11-16 ENCOUNTER — Inpatient Hospital Stay: Admit: 2017-11-16 | Discharge: 2017-11-17 | Attending: Critical Care Medicine | Primary: Family Medicine

## 2017-11-16 DIAGNOSIS — R942 Abnormal results of pulmonary function studies: Secondary | ICD-10-CM

## 2017-11-16 DIAGNOSIS — R05 Cough: Principal | ICD-10-CM

## 2017-11-21 ENCOUNTER — Ambulatory Visit: Attending: Surgery | Primary: Family Medicine

## 2017-11-21 DIAGNOSIS — F419 Anxiety disorder, unspecified: Secondary | ICD-10-CM

## 2017-11-21 DIAGNOSIS — Z9889 Other specified postprocedural states: Principal | ICD-10-CM

## 2017-11-21 DIAGNOSIS — M1990 Unspecified osteoarthritis, unspecified site: Secondary | ICD-10-CM

## 2017-11-21 DIAGNOSIS — I219 Acute myocardial infarction, unspecified: Secondary | ICD-10-CM

## 2017-11-21 DIAGNOSIS — I10 Essential (primary) hypertension: Principal | ICD-10-CM

## 2017-11-21 DIAGNOSIS — I5189 Other ill-defined heart diseases: Secondary | ICD-10-CM

## 2017-11-21 DIAGNOSIS — Z8719 Personal history of other diseases of the digestive system: Secondary | ICD-10-CM

## 2017-11-21 DIAGNOSIS — N400 Enlarged prostate without lower urinary tract symptoms: Secondary | ICD-10-CM

## 2017-11-21 DIAGNOSIS — E119 Type 2 diabetes mellitus without complications: Secondary | ICD-10-CM

## 2017-11-21 DIAGNOSIS — K219 Gastro-esophageal reflux disease without esophagitis: Secondary | ICD-10-CM

## 2017-11-21 DIAGNOSIS — C4491 Basal cell carcinoma of skin, unspecified: Secondary | ICD-10-CM

## 2017-11-21 DIAGNOSIS — N189 Chronic kidney disease, unspecified: Secondary | ICD-10-CM

## 2017-11-21 DIAGNOSIS — I25118 Atherosclerotic heart disease of native coronary artery with other forms of angina pectoris: Secondary | ICD-10-CM

## 2017-11-21 DIAGNOSIS — I739 Peripheral vascular disease, unspecified: Secondary | ICD-10-CM

## 2017-11-21 DIAGNOSIS — M542 Cervicalgia: Secondary | ICD-10-CM

## 2017-11-21 DIAGNOSIS — C439 Malignant melanoma of skin, unspecified: Secondary | ICD-10-CM

## 2017-11-21 DIAGNOSIS — Z9189 Other specified personal risk factors, not elsewhere classified: Secondary | ICD-10-CM

## 2017-11-21 DIAGNOSIS — E785 Hyperlipidemia, unspecified: Secondary | ICD-10-CM

## 2017-11-21 DIAGNOSIS — I25810 Atherosclerosis of coronary artery bypass graft(s) without angina pectoris: Secondary | ICD-10-CM

## 2017-11-21 DIAGNOSIS — G629 Polyneuropathy, unspecified: Secondary | ICD-10-CM

## 2017-11-21 DIAGNOSIS — Z9109 Other allergy status, other than to drugs and biological substances: Secondary | ICD-10-CM

## 2017-11-21 DIAGNOSIS — I2510 Atherosclerotic heart disease of native coronary artery without angina pectoris: Secondary | ICD-10-CM

## 2017-11-22 MED ORDER — TAMSULOSIN HCL 0.4 MG PO CAPS
3 refills | Status: CP
Start: 2017-11-22 — End: 2018-06-27

## 2017-11-22 MED ORDER — CLOPIDOGREL BISULFATE 75 MG PO TABS
3 refills | Status: CP
Start: 2017-11-22 — End: ?

## 2017-11-22 MED ORDER — LOSARTAN POTASSIUM-HCTZ 100-25 MG PO TABS
ORAL_TABLET | 3 refills | Status: CP
Start: 2017-11-22 — End: 2018-02-13

## 2017-11-22 MED ORDER — ATORVASTATIN CALCIUM 40 MG PO TABS
3 refills | Status: CP
Start: 2017-11-22 — End: ?

## 2017-11-22 MED ORDER — METOPROLOL SUCCINATE ER 25 MG PO TB24
1 refills | Status: CP
Start: 2017-11-22 — End: 2018-02-13

## 2017-11-22 MED ORDER — GLUCOSE BLOOD VI STRP
ORAL_STRIP | 11 refills | Status: CP
Start: 2017-11-22 — End: 2017-12-13

## 2017-11-23 ENCOUNTER — Encounter: Attending: Podiatrist | Primary: Family Medicine

## 2017-12-01 ENCOUNTER — Encounter: Attending: Family Medicine | Primary: Family Medicine

## 2017-12-07 ENCOUNTER — Encounter: Primary: Family Medicine

## 2017-12-07 ENCOUNTER — Ambulatory Visit: Attending: Orthopaedic Surgery | Primary: Family Medicine

## 2017-12-07 DIAGNOSIS — I739 Peripheral vascular disease, unspecified: Secondary | ICD-10-CM

## 2017-12-07 DIAGNOSIS — I2510 Atherosclerotic heart disease of native coronary artery without angina pectoris: Secondary | ICD-10-CM

## 2017-12-07 DIAGNOSIS — Z9189 Other specified personal risk factors, not elsewhere classified: Secondary | ICD-10-CM

## 2017-12-07 DIAGNOSIS — N400 Enlarged prostate without lower urinary tract symptoms: Secondary | ICD-10-CM

## 2017-12-07 DIAGNOSIS — E119 Type 2 diabetes mellitus without complications: Secondary | ICD-10-CM

## 2017-12-07 DIAGNOSIS — I10 Essential (primary) hypertension: Principal | ICD-10-CM

## 2017-12-07 DIAGNOSIS — I219 Acute myocardial infarction, unspecified: Secondary | ICD-10-CM

## 2017-12-07 DIAGNOSIS — M1990 Unspecified osteoarthritis, unspecified site: Secondary | ICD-10-CM

## 2017-12-07 DIAGNOSIS — M1712 Unilateral primary osteoarthritis, left knee: Secondary | ICD-10-CM

## 2017-12-07 DIAGNOSIS — E785 Hyperlipidemia, unspecified: Secondary | ICD-10-CM

## 2017-12-07 DIAGNOSIS — K219 Gastro-esophageal reflux disease without esophagitis: Secondary | ICD-10-CM

## 2017-12-07 DIAGNOSIS — Z9109 Other allergy status, other than to drugs and biological substances: Secondary | ICD-10-CM

## 2017-12-07 DIAGNOSIS — N189 Chronic kidney disease, unspecified: Secondary | ICD-10-CM

## 2017-12-07 DIAGNOSIS — F419 Anxiety disorder, unspecified: Secondary | ICD-10-CM

## 2017-12-07 DIAGNOSIS — C4491 Basal cell carcinoma of skin, unspecified: Secondary | ICD-10-CM

## 2017-12-07 DIAGNOSIS — C439 Malignant melanoma of skin, unspecified: Secondary | ICD-10-CM

## 2017-12-07 DIAGNOSIS — G629 Polyneuropathy, unspecified: Secondary | ICD-10-CM

## 2017-12-07 DIAGNOSIS — M542 Cervicalgia: Secondary | ICD-10-CM

## 2017-12-13 ENCOUNTER — Ambulatory Visit: Attending: Family Medicine | Primary: Family Medicine

## 2017-12-13 DIAGNOSIS — E785 Hyperlipidemia, unspecified: Secondary | ICD-10-CM

## 2017-12-13 DIAGNOSIS — K4090 Unilateral inguinal hernia, without obstruction or gangrene, not specified as recurrent: Principal | ICD-10-CM

## 2017-12-13 DIAGNOSIS — K219 Gastro-esophageal reflux disease without esophagitis: Secondary | ICD-10-CM

## 2017-12-13 DIAGNOSIS — E1142 Type 2 diabetes mellitus with diabetic polyneuropathy: Secondary | ICD-10-CM

## 2017-12-13 DIAGNOSIS — I10 Essential (primary) hypertension: Secondary | ICD-10-CM

## 2017-12-13 DIAGNOSIS — H579 Unspecified disorder of eye and adnexa: Secondary | ICD-10-CM

## 2017-12-13 DIAGNOSIS — I25810 Atherosclerosis of coronary artery bypass graft(s) without angina pectoris: Secondary | ICD-10-CM

## 2017-12-13 DIAGNOSIS — I208 Other forms of angina pectoris: Secondary | ICD-10-CM

## 2017-12-13 DIAGNOSIS — Z85820 Personal history of malignant melanoma of skin: Secondary | ICD-10-CM

## 2017-12-13 DIAGNOSIS — Z794 Long term (current) use of insulin: Secondary | ICD-10-CM

## 2017-12-13 DIAGNOSIS — N400 Enlarged prostate without lower urinary tract symptoms: Secondary | ICD-10-CM

## 2017-12-13 DIAGNOSIS — Z9889 Other specified postprocedural states: Secondary | ICD-10-CM

## 2017-12-13 DIAGNOSIS — M542 Cervicalgia: Secondary | ICD-10-CM

## 2017-12-13 DIAGNOSIS — E538 Deficiency of other specified B group vitamins: Secondary | ICD-10-CM

## 2017-12-13 MED ORDER — RANOLAZINE ER 500 MG PO TB12
500 mg | Freq: Two times a day (BID) | ORAL | 3 refills | Status: CP
Start: 2017-12-13 — End: 2017-12-28

## 2017-12-13 MED ORDER — INSULIN ASPART 100 UNIT/ML SC SOLN
4 refills | Status: CP
Start: 2017-12-13 — End: ?

## 2017-12-13 MED ORDER — HYDROCODONE-ACETAMINOPHEN 5-325 MG PO TABS
1 | ORAL_TABLET | Freq: Four times a day (QID) | ORAL | 0 refills | Status: CP | PRN
Start: 2017-12-13 — End: 2017-12-13

## 2017-12-13 MED ORDER — HYDROCODONE-ACETAMINOPHEN 5-325 MG PO TABS
1 | ORAL_TABLET | Freq: Four times a day (QID) | ORAL | 0 refills | Status: CP | PRN
Start: 2017-12-13 — End: 2018-02-13

## 2017-12-13 MED ORDER — ISOSORBIDE MONONITRATE ER 30 MG PO TB24
30 mg | Freq: Every day | ORAL | 3 refills | Status: CP
Start: 2017-12-13 — End: 2017-12-13

## 2017-12-13 MED ORDER — FAMOTIDINE 20 MG PO TABS
20 mg | Freq: Every day | ORAL | 3 refills | Status: CP
Start: 2017-12-13 — End: 2018-02-13

## 2017-12-13 MED ORDER — GLUCOSE BLOOD VI STRP
ORAL_STRIP | 11 refills | Status: CP
Start: 2017-12-13 — End: ?

## 2017-12-28 ENCOUNTER — Ambulatory Visit: Attending: Podiatrist | Primary: Family Medicine

## 2017-12-28 ENCOUNTER — Ambulatory Visit: Attending: "Endocrinology | Primary: Family Medicine

## 2017-12-28 DIAGNOSIS — M1990 Unspecified osteoarthritis, unspecified site: Secondary | ICD-10-CM

## 2017-12-28 DIAGNOSIS — E11621 Type 2 diabetes mellitus with foot ulcer: Principal | ICD-10-CM

## 2017-12-28 DIAGNOSIS — L97509 Non-pressure chronic ulcer of other part of unspecified foot with unspecified severity: Secondary | ICD-10-CM

## 2017-12-28 DIAGNOSIS — N400 Enlarged prostate without lower urinary tract symptoms: Secondary | ICD-10-CM

## 2017-12-28 DIAGNOSIS — E1142 Type 2 diabetes mellitus with diabetic polyneuropathy: Secondary | ICD-10-CM

## 2017-12-28 DIAGNOSIS — Z794 Long term (current) use of insulin: Secondary | ICD-10-CM

## 2017-12-28 DIAGNOSIS — E7849 Other hyperlipidemia: Secondary | ICD-10-CM

## 2017-12-28 DIAGNOSIS — I739 Peripheral vascular disease, unspecified: Secondary | ICD-10-CM

## 2017-12-28 DIAGNOSIS — E1140 Type 2 diabetes mellitus with diabetic neuropathy, unspecified: Secondary | ICD-10-CM

## 2017-12-28 DIAGNOSIS — Z9109 Other allergy status, other than to drugs and biological substances: Secondary | ICD-10-CM

## 2017-12-28 DIAGNOSIS — I2510 Atherosclerotic heart disease of native coronary artery without angina pectoris: Secondary | ICD-10-CM

## 2017-12-28 DIAGNOSIS — B351 Tinea unguium: Secondary | ICD-10-CM

## 2017-12-28 DIAGNOSIS — K219 Gastro-esophageal reflux disease without esophagitis: Secondary | ICD-10-CM

## 2017-12-28 DIAGNOSIS — C4491 Basal cell carcinoma of skin, unspecified: Secondary | ICD-10-CM

## 2017-12-28 DIAGNOSIS — E785 Hyperlipidemia, unspecified: Secondary | ICD-10-CM

## 2017-12-28 DIAGNOSIS — I10 Essential (primary) hypertension: Secondary | ICD-10-CM

## 2017-12-28 DIAGNOSIS — E119 Type 2 diabetes mellitus without complications: Secondary | ICD-10-CM

## 2017-12-28 DIAGNOSIS — N189 Chronic kidney disease, unspecified: Secondary | ICD-10-CM

## 2017-12-28 DIAGNOSIS — N183 Chronic kidney disease, stage 3 (moderate): Secondary | ICD-10-CM

## 2017-12-28 DIAGNOSIS — C439 Malignant melanoma of skin, unspecified: Secondary | ICD-10-CM

## 2017-12-28 DIAGNOSIS — I219 Acute myocardial infarction, unspecified: Secondary | ICD-10-CM

## 2017-12-28 DIAGNOSIS — IMO0002 Toe amputation status, right: Secondary | ICD-10-CM

## 2017-12-28 DIAGNOSIS — G629 Polyneuropathy, unspecified: Secondary | ICD-10-CM

## 2017-12-28 DIAGNOSIS — Z006 Encounter for examination for normal comparison and control in clinical research program: Principal | ICD-10-CM

## 2017-12-28 DIAGNOSIS — Z9189 Other specified personal risk factors, not elsewhere classified: Secondary | ICD-10-CM

## 2017-12-28 DIAGNOSIS — M542 Cervicalgia: Secondary | ICD-10-CM

## 2017-12-28 DIAGNOSIS — F419 Anxiety disorder, unspecified: Secondary | ICD-10-CM

## 2017-12-28 MED ORDER — KETOCONAZOLE 2 % EX CREA
Freq: Every day | TOPICAL | 2 refills | Status: CP
Start: 2017-12-28 — End: 2018-06-27

## 2017-12-29 ENCOUNTER — Encounter: Attending: Critical Care Medicine | Primary: Family Medicine

## 2018-01-04 ENCOUNTER — Encounter: Attending: "Endocrinology | Primary: Family Medicine

## 2018-02-13 ENCOUNTER — Ambulatory Visit: Attending: Family Medicine | Primary: Family Medicine

## 2018-02-13 ENCOUNTER — Ambulatory Visit: Attending: Critical Care Medicine | Primary: Family Medicine

## 2018-02-13 DIAGNOSIS — N183 Chronic kidney disease, stage 3 (moderate): Secondary | ICD-10-CM

## 2018-02-13 DIAGNOSIS — E1151 Type 2 diabetes mellitus with diabetic peripheral angiopathy without gangrene: Secondary | ICD-10-CM

## 2018-02-13 DIAGNOSIS — K4090 Unilateral inguinal hernia, without obstruction or gangrene, not specified as recurrent: Principal | ICD-10-CM

## 2018-02-13 DIAGNOSIS — I5189 Other ill-defined heart diseases: Secondary | ICD-10-CM

## 2018-02-13 DIAGNOSIS — R05 Cough: Secondary | ICD-10-CM

## 2018-02-13 DIAGNOSIS — Z85820 Personal history of malignant melanoma of skin: Secondary | ICD-10-CM

## 2018-02-13 DIAGNOSIS — I10 Essential (primary) hypertension: Secondary | ICD-10-CM

## 2018-02-13 DIAGNOSIS — I25810 Atherosclerosis of coronary artery bypass graft(s) without angina pectoris: Secondary | ICD-10-CM

## 2018-02-13 DIAGNOSIS — Z6826 Body mass index (BMI) 26.0-26.9, adult: Principal | ICD-10-CM

## 2018-02-13 DIAGNOSIS — E1142 Type 2 diabetes mellitus with diabetic polyneuropathy: Secondary | ICD-10-CM

## 2018-02-13 DIAGNOSIS — J302 Other seasonal allergic rhinitis: Secondary | ICD-10-CM

## 2018-02-13 DIAGNOSIS — M542 Cervicalgia: Secondary | ICD-10-CM

## 2018-02-13 DIAGNOSIS — E538 Deficiency of other specified B group vitamins: Secondary | ICD-10-CM

## 2018-02-13 DIAGNOSIS — K219 Gastro-esophageal reflux disease without esophagitis: Secondary | ICD-10-CM

## 2018-02-13 DIAGNOSIS — Z794 Long term (current) use of insulin: Secondary | ICD-10-CM

## 2018-02-13 MED ORDER — GABAPENTIN 600 MG PO TABS
600 mg | Freq: Three times a day (TID) | ORAL | 3 refills | Status: CP
Start: 2018-02-13 — End: ?

## 2018-02-13 MED ORDER — MONTELUKAST SODIUM 10 MG PO TABS
10 mg | Freq: Every evening | ORAL | 1 refills | Status: CP
Start: 2018-02-13 — End: ?

## 2018-02-13 MED ORDER — METOPROLOL SUCCINATE ER 25 MG PO TB24
1 refills | Status: CP
Start: 2018-02-13 — End: 2018-06-27

## 2018-02-13 MED ORDER — FLUTICASONE PROPIONATE 50 MCG/ACT NA SUSP
1 | Freq: Every day | NASAL | 2 refills | Status: CP
Start: 2018-02-13 — End: ?

## 2018-02-13 MED ORDER — HYDROCODONE-ACETAMINOPHEN 5-325 MG PO TABS
1 | ORAL_TABLET | Freq: Four times a day (QID) | ORAL | 0 refills | Status: CP | PRN
Start: 2018-02-13 — End: 2018-02-13

## 2018-02-13 MED ORDER — HYDROCODONE-ACETAMINOPHEN 5-325 MG PO TABS
1 | ORAL_TABLET | Freq: Four times a day (QID) | ORAL | 0 refills | Status: CP | PRN
Start: 2018-02-13 — End: 2018-05-03

## 2018-02-14 ENCOUNTER — Encounter: Attending: Critical Care Medicine | Primary: Family Medicine

## 2018-03-29 ENCOUNTER — Encounter: Attending: "Endocrinology | Primary: Family Medicine

## 2018-03-29 ENCOUNTER — Encounter: Attending: Podiatrist | Primary: Family Medicine

## 2018-04-06 ENCOUNTER — Encounter: Attending: Family Medicine | Primary: Family Medicine

## 2018-05-02 ENCOUNTER — Ambulatory Visit: Attending: Surgery | Primary: Family Medicine

## 2018-05-02 DIAGNOSIS — M1990 Unspecified osteoarthritis, unspecified site: Secondary | ICD-10-CM

## 2018-05-02 DIAGNOSIS — Z9189 Other specified personal risk factors, not elsewhere classified: Secondary | ICD-10-CM

## 2018-05-02 DIAGNOSIS — E119 Type 2 diabetes mellitus without complications: Secondary | ICD-10-CM

## 2018-05-02 DIAGNOSIS — I739 Peripheral vascular disease, unspecified: Secondary | ICD-10-CM

## 2018-05-02 DIAGNOSIS — N400 Enlarged prostate without lower urinary tract symptoms: Secondary | ICD-10-CM

## 2018-05-02 DIAGNOSIS — Z9109 Other allergy status, other than to drugs and biological substances: Secondary | ICD-10-CM

## 2018-05-02 DIAGNOSIS — N433 Hydrocele, unspecified: Secondary | ICD-10-CM

## 2018-05-02 DIAGNOSIS — C4491 Basal cell carcinoma of skin, unspecified: Secondary | ICD-10-CM

## 2018-05-02 DIAGNOSIS — I219 Acute myocardial infarction, unspecified: Secondary | ICD-10-CM

## 2018-05-02 DIAGNOSIS — K219 Gastro-esophageal reflux disease without esophagitis: Secondary | ICD-10-CM

## 2018-05-02 DIAGNOSIS — M542 Cervicalgia: Secondary | ICD-10-CM

## 2018-05-02 DIAGNOSIS — G629 Polyneuropathy, unspecified: Secondary | ICD-10-CM

## 2018-05-02 DIAGNOSIS — E785 Hyperlipidemia, unspecified: Secondary | ICD-10-CM

## 2018-05-02 DIAGNOSIS — I10 Essential (primary) hypertension: Principal | ICD-10-CM

## 2018-05-02 DIAGNOSIS — F419 Anxiety disorder, unspecified: Secondary | ICD-10-CM

## 2018-05-02 DIAGNOSIS — C439 Malignant melanoma of skin, unspecified: Secondary | ICD-10-CM

## 2018-05-02 DIAGNOSIS — N189 Chronic kidney disease, unspecified: Secondary | ICD-10-CM

## 2018-05-02 DIAGNOSIS — I2510 Atherosclerotic heart disease of native coronary artery without angina pectoris: Secondary | ICD-10-CM

## 2018-05-03 ENCOUNTER — Ambulatory Visit: Attending: Family Medicine | Primary: Family Medicine

## 2018-05-03 DIAGNOSIS — M542 Cervicalgia: Secondary | ICD-10-CM

## 2018-05-03 DIAGNOSIS — Z6826 Body mass index (BMI) 26.0-26.9, adult: Secondary | ICD-10-CM

## 2018-05-03 DIAGNOSIS — E538 Deficiency of other specified B group vitamins: Secondary | ICD-10-CM

## 2018-05-03 DIAGNOSIS — M25531 Pain in right wrist: Principal | ICD-10-CM

## 2018-05-03 DIAGNOSIS — K219 Gastro-esophageal reflux disease without esophagitis: Secondary | ICD-10-CM

## 2018-05-03 DIAGNOSIS — N183 Chronic kidney disease, stage 3 (moderate): Secondary | ICD-10-CM

## 2018-05-03 DIAGNOSIS — I10 Essential (primary) hypertension: Secondary | ICD-10-CM

## 2018-05-03 DIAGNOSIS — I25810 Atherosclerosis of coronary artery bypass graft(s) without angina pectoris: Secondary | ICD-10-CM

## 2018-05-03 DIAGNOSIS — M170 Bilateral primary osteoarthritis of knee: Secondary | ICD-10-CM

## 2018-05-03 DIAGNOSIS — N400 Enlarged prostate without lower urinary tract symptoms: Secondary | ICD-10-CM

## 2018-05-03 DIAGNOSIS — M25532 Pain in left wrist: Secondary | ICD-10-CM

## 2018-05-03 MED ORDER — HYDROCODONE-ACETAMINOPHEN 5-325 MG PO TABS
1 | ORAL_TABLET | Freq: Four times a day (QID) | ORAL | 0 refills | Status: CP | PRN
Start: 2018-05-03 — End: 2018-05-03

## 2018-05-03 MED ORDER — LOSARTAN POTASSIUM 25 MG PO TABS
25 mg | Freq: Every day | ORAL | 3 refills | Status: CP
Start: 2018-05-03 — End: 2018-06-27

## 2018-05-03 MED ORDER — HYDROCODONE-ACETAMINOPHEN 5-325 MG PO TABS
1 | ORAL_TABLET | Freq: Four times a day (QID) | ORAL | 0 refills | Status: CP | PRN
Start: 2018-05-03 — End: 2018-06-27

## 2018-05-03 MED ORDER — DICLOFENAC SODIUM 1 % TD GEL
2 g | Freq: Four times a day (QID) | TOPICAL | 3 refills | Status: CP | PRN
Start: 2018-05-03 — End: ?

## 2018-05-08 DIAGNOSIS — E7849 Other hyperlipidemia: Secondary | ICD-10-CM

## 2018-05-08 DIAGNOSIS — G609 Hereditary and idiopathic neuropathy, unspecified: Secondary | ICD-10-CM

## 2018-05-08 DIAGNOSIS — I10 Essential (primary) hypertension: Secondary | ICD-10-CM

## 2018-05-08 DIAGNOSIS — E1142 Type 2 diabetes mellitus with diabetic polyneuropathy: Principal | ICD-10-CM

## 2018-05-12 ENCOUNTER — Ambulatory Visit: Attending: Podiatrist | Primary: Family Medicine

## 2018-05-12 DIAGNOSIS — E119 Type 2 diabetes mellitus without complications: Secondary | ICD-10-CM

## 2018-05-12 DIAGNOSIS — E1142 Type 2 diabetes mellitus with diabetic polyneuropathy: Principal | ICD-10-CM

## 2018-05-12 DIAGNOSIS — L97511 Non-pressure chronic ulcer of other part of right foot limited to breakdown of skin: Secondary | ICD-10-CM

## 2018-05-17 ENCOUNTER — Encounter: Attending: "Endocrinology | Primary: Family Medicine

## 2018-05-24 ENCOUNTER — Encounter: Attending: Podiatrist | Primary: Family Medicine

## 2018-05-24 ENCOUNTER — Ambulatory Visit: Attending: Orthopaedic Surgery | Primary: Family Medicine

## 2018-05-24 ENCOUNTER — Inpatient Hospital Stay: Admit: 2018-05-24 | Discharge: 2018-05-25 | Primary: Family Medicine

## 2018-05-24 DIAGNOSIS — N4340 Spermatocele of epididymis, unspecified: Secondary | ICD-10-CM

## 2018-05-24 DIAGNOSIS — M170 Bilateral primary osteoarthritis of knee: Principal | ICD-10-CM

## 2018-05-24 DIAGNOSIS — I739 Peripheral vascular disease, unspecified: Secondary | ICD-10-CM

## 2018-05-24 DIAGNOSIS — N433 Hydrocele, unspecified: Principal | ICD-10-CM

## 2018-05-24 DIAGNOSIS — N400 Enlarged prostate without lower urinary tract symptoms: Secondary | ICD-10-CM

## 2018-05-24 DIAGNOSIS — C4491 Basal cell carcinoma of skin, unspecified: Secondary | ICD-10-CM

## 2018-05-24 DIAGNOSIS — Z9189 Other specified personal risk factors, not elsewhere classified: Secondary | ICD-10-CM

## 2018-05-24 DIAGNOSIS — I10 Essential (primary) hypertension: Principal | ICD-10-CM

## 2018-05-24 DIAGNOSIS — E119 Type 2 diabetes mellitus without complications: Secondary | ICD-10-CM

## 2018-05-24 DIAGNOSIS — Z9109 Other allergy status, other than to drugs and biological substances: Secondary | ICD-10-CM

## 2018-05-24 DIAGNOSIS — C439 Malignant melanoma of skin, unspecified: Secondary | ICD-10-CM

## 2018-05-24 DIAGNOSIS — G629 Polyneuropathy, unspecified: Secondary | ICD-10-CM

## 2018-05-24 DIAGNOSIS — N189 Chronic kidney disease, unspecified: Secondary | ICD-10-CM

## 2018-05-24 DIAGNOSIS — M1990 Unspecified osteoarthritis, unspecified site: Secondary | ICD-10-CM

## 2018-05-24 DIAGNOSIS — E785 Hyperlipidemia, unspecified: Secondary | ICD-10-CM

## 2018-05-24 DIAGNOSIS — F419 Anxiety disorder, unspecified: Secondary | ICD-10-CM

## 2018-05-24 DIAGNOSIS — I219 Acute myocardial infarction, unspecified: Secondary | ICD-10-CM

## 2018-05-24 DIAGNOSIS — K219 Gastro-esophageal reflux disease without esophagitis: Secondary | ICD-10-CM

## 2018-05-24 DIAGNOSIS — M542 Cervicalgia: Secondary | ICD-10-CM

## 2018-05-24 DIAGNOSIS — N503 Cyst of epididymis: Secondary | ICD-10-CM

## 2018-05-24 DIAGNOSIS — I2510 Atherosclerotic heart disease of native coronary artery without angina pectoris: Secondary | ICD-10-CM

## 2018-06-02 ENCOUNTER — Encounter: Attending: Podiatrist | Primary: Family Medicine

## 2018-06-15 ENCOUNTER — Ambulatory Visit: Attending: Internal Medicine | Primary: Family Medicine

## 2018-06-15 DIAGNOSIS — E119 Type 2 diabetes mellitus without complications: Secondary | ICD-10-CM

## 2018-06-15 DIAGNOSIS — L720 Epidermal cyst: Secondary | ICD-10-CM

## 2018-06-15 DIAGNOSIS — G629 Polyneuropathy, unspecified: Secondary | ICD-10-CM

## 2018-06-15 DIAGNOSIS — M1990 Unspecified osteoarthritis, unspecified site: Secondary | ICD-10-CM

## 2018-06-15 DIAGNOSIS — L570 Actinic keratosis: Secondary | ICD-10-CM

## 2018-06-15 DIAGNOSIS — L820 Inflamed seborrheic keratosis: Principal | ICD-10-CM

## 2018-06-15 DIAGNOSIS — I2510 Atherosclerotic heart disease of native coronary artery without angina pectoris: Secondary | ICD-10-CM

## 2018-06-15 DIAGNOSIS — C4491 Basal cell carcinoma of skin, unspecified: Secondary | ICD-10-CM

## 2018-06-15 DIAGNOSIS — I219 Acute myocardial infarction, unspecified: Secondary | ICD-10-CM

## 2018-06-15 DIAGNOSIS — F419 Anxiety disorder, unspecified: Secondary | ICD-10-CM

## 2018-06-15 DIAGNOSIS — E785 Hyperlipidemia, unspecified: Secondary | ICD-10-CM

## 2018-06-15 DIAGNOSIS — M542 Cervicalgia: Secondary | ICD-10-CM

## 2018-06-15 DIAGNOSIS — I739 Peripheral vascular disease, unspecified: Secondary | ICD-10-CM

## 2018-06-15 DIAGNOSIS — C439 Malignant melanoma of skin, unspecified: Secondary | ICD-10-CM

## 2018-06-15 DIAGNOSIS — N400 Enlarged prostate without lower urinary tract symptoms: Secondary | ICD-10-CM

## 2018-06-15 DIAGNOSIS — Z9109 Other allergy status, other than to drugs and biological substances: Secondary | ICD-10-CM

## 2018-06-15 DIAGNOSIS — N189 Chronic kidney disease, unspecified: Secondary | ICD-10-CM

## 2018-06-15 DIAGNOSIS — Z9189 Other specified personal risk factors, not elsewhere classified: Secondary | ICD-10-CM

## 2018-06-15 DIAGNOSIS — K219 Gastro-esophageal reflux disease without esophagitis: Secondary | ICD-10-CM

## 2018-06-15 DIAGNOSIS — I10 Essential (primary) hypertension: Principal | ICD-10-CM

## 2018-06-16 DIAGNOSIS — Z9189 Other specified personal risk factors, not elsewhere classified: Secondary | ICD-10-CM

## 2018-06-16 DIAGNOSIS — M1990 Unspecified osteoarthritis, unspecified site: Secondary | ICD-10-CM

## 2018-06-16 DIAGNOSIS — I739 Peripheral vascular disease, unspecified: Secondary | ICD-10-CM

## 2018-06-16 DIAGNOSIS — N189 Chronic kidney disease, unspecified: Secondary | ICD-10-CM

## 2018-06-16 DIAGNOSIS — C439 Malignant melanoma of skin, unspecified: Secondary | ICD-10-CM

## 2018-06-16 DIAGNOSIS — I219 Acute myocardial infarction, unspecified: Secondary | ICD-10-CM

## 2018-06-16 DIAGNOSIS — G629 Polyneuropathy, unspecified: Secondary | ICD-10-CM

## 2018-06-16 DIAGNOSIS — F419 Anxiety disorder, unspecified: Secondary | ICD-10-CM

## 2018-06-16 DIAGNOSIS — M542 Cervicalgia: Secondary | ICD-10-CM

## 2018-06-16 DIAGNOSIS — C4491 Basal cell carcinoma of skin, unspecified: Secondary | ICD-10-CM

## 2018-06-16 DIAGNOSIS — Z9109 Other allergy status, other than to drugs and biological substances: Secondary | ICD-10-CM

## 2018-06-16 DIAGNOSIS — I2510 Atherosclerotic heart disease of native coronary artery without angina pectoris: Secondary | ICD-10-CM

## 2018-06-16 DIAGNOSIS — E119 Type 2 diabetes mellitus without complications: Secondary | ICD-10-CM

## 2018-06-16 DIAGNOSIS — E785 Hyperlipidemia, unspecified: Secondary | ICD-10-CM

## 2018-06-16 DIAGNOSIS — K219 Gastro-esophageal reflux disease without esophagitis: Secondary | ICD-10-CM

## 2018-06-16 DIAGNOSIS — N400 Enlarged prostate without lower urinary tract symptoms: Secondary | ICD-10-CM

## 2018-06-16 DIAGNOSIS — I10 Essential (primary) hypertension: Principal | ICD-10-CM

## 2018-06-27 ENCOUNTER — Ambulatory Visit: Attending: Family Medicine | Primary: Family Medicine

## 2018-06-27 DIAGNOSIS — R351 Nocturia: Secondary | ICD-10-CM

## 2018-06-27 DIAGNOSIS — M542 Cervicalgia: Secondary | ICD-10-CM

## 2018-06-27 DIAGNOSIS — N401 Enlarged prostate with lower urinary tract symptoms: Secondary | ICD-10-CM

## 2018-06-27 DIAGNOSIS — I5189 Other ill-defined heart diseases: Secondary | ICD-10-CM

## 2018-06-27 DIAGNOSIS — N183 Chronic kidney disease, stage 3 (moderate): Secondary | ICD-10-CM

## 2018-06-27 DIAGNOSIS — I25810 Atherosclerosis of coronary artery bypass graft(s) without angina pectoris: Principal | ICD-10-CM

## 2018-06-27 DIAGNOSIS — I10 Essential (primary) hypertension: Secondary | ICD-10-CM

## 2018-06-27 DIAGNOSIS — E1151 Type 2 diabetes mellitus with diabetic peripheral angiopathy without gangrene: Secondary | ICD-10-CM

## 2018-06-27 MED ORDER — HYDROCODONE-ACETAMINOPHEN 5-325 MG PO TABS
1 | ORAL_TABLET | Freq: Four times a day (QID) | ORAL | 0 refills | Status: CP | PRN
Start: 2018-06-27 — End: ?

## 2018-06-27 MED ORDER — TAMSULOSIN HCL 0.4 MG PO CAPS
.4 mg | Freq: Every day | ORAL | 3 refills | Status: CP
Start: 2018-06-27 — End: ?

## 2018-06-27 MED ORDER — LOSARTAN POTASSIUM 100 MG PO TABS
100 mg | Freq: Every day | ORAL | 3 refills | Status: CP
Start: 2018-06-27 — End: ?

## 2018-06-27 MED ORDER — HYDROCODONE-ACETAMINOPHEN 5-325 MG PO TABS
1 | ORAL_TABLET | Freq: Four times a day (QID) | ORAL | 0 refills | Status: CP | PRN
Start: 2018-06-27 — End: 2018-06-27

## 2018-06-27 MED ORDER — METOPROLOL SUCCINATE ER 50 MG PO TB24
50 mg | Freq: Every day | ORAL | 3 refills | Status: CP
Start: 2018-06-27 — End: ?

## 2018-06-27 MED ORDER — AMLODIPINE BESYLATE 10 MG PO TABS
10 mg | Freq: Every day | ORAL | 3 refills | Status: CP
Start: 2018-06-27 — End: ?

## 2018-06-28 ENCOUNTER — Encounter: Attending: "Endocrinology | Primary: Family Medicine

## 2018-07-03 ENCOUNTER — Encounter: Attending: Family Medicine | Primary: Family Medicine

## 2018-07-06 DIAGNOSIS — I10 Essential (primary) hypertension: Principal | ICD-10-CM

## 2018-07-06 MED ORDER — HYDROCHLOROTHIAZIDE 12.5 MG PO CAPS
12.5 mg | Freq: Every day | ORAL | 3 refills | Status: CP
Start: 2018-07-06 — End: 2018-07-18

## 2018-07-13 DIAGNOSIS — I10 Essential (primary) hypertension: Principal | ICD-10-CM

## 2018-07-17 MED ORDER — HYDROCHLOROTHIAZIDE 12.5 MG PO CAPS
12.5 mg | Freq: Two times a day (BID) | ORAL | 3 refills | Status: CP
Start: 2018-07-17 — End: ?

## 2018-08-30 DIAGNOSIS — I10 Essential (primary) hypertension: Principal | ICD-10-CM

## 2018-08-31 MED ORDER — LOSARTAN POTASSIUM 100 MG PO TABS
0 refills | Status: CP
Start: 2018-08-31 — End: 2018-09-08

## 2018-09-05 DIAGNOSIS — I25118 Atherosclerotic heart disease of native coronary artery with other forms of angina pectoris: Principal | ICD-10-CM

## 2018-09-05 MED ORDER — ATORVASTATIN CALCIUM 40 MG PO TABS
0 refills | Status: CP
Start: 2018-09-05 — End: ?

## 2018-09-07 ENCOUNTER — Ambulatory Visit: Attending: Family Medicine | Primary: Family Medicine

## 2018-09-07 DIAGNOSIS — M1990 Unspecified osteoarthritis, unspecified site: Secondary | ICD-10-CM

## 2018-09-07 DIAGNOSIS — E538 Deficiency of other specified B group vitamins: Secondary | ICD-10-CM

## 2018-09-07 DIAGNOSIS — I739 Peripheral vascular disease, unspecified: Secondary | ICD-10-CM

## 2018-09-07 DIAGNOSIS — E1151 Type 2 diabetes mellitus with diabetic peripheral angiopathy without gangrene: Secondary | ICD-10-CM

## 2018-09-07 DIAGNOSIS — N400 Enlarged prostate without lower urinary tract symptoms: Secondary | ICD-10-CM

## 2018-09-07 DIAGNOSIS — C439 Malignant melanoma of skin, unspecified: Secondary | ICD-10-CM

## 2018-09-07 DIAGNOSIS — Z9641 Presence of insulin pump (external) (internal): Secondary | ICD-10-CM

## 2018-09-07 DIAGNOSIS — I25810 Atherosclerosis of coronary artery bypass graft(s) without angina pectoris: Secondary | ICD-10-CM

## 2018-09-07 DIAGNOSIS — Z6826 Body mass index (BMI) 26.0-26.9, adult: Principal | ICD-10-CM

## 2018-09-07 DIAGNOSIS — K219 Gastro-esophageal reflux disease without esophagitis: Secondary | ICD-10-CM

## 2018-09-07 DIAGNOSIS — G629 Polyneuropathy, unspecified: Secondary | ICD-10-CM

## 2018-09-07 DIAGNOSIS — I219 Acute myocardial infarction, unspecified: Secondary | ICD-10-CM

## 2018-09-07 DIAGNOSIS — C4491 Basal cell carcinoma of skin, unspecified: Secondary | ICD-10-CM

## 2018-09-07 DIAGNOSIS — Z981 Arthrodesis status: Secondary | ICD-10-CM

## 2018-09-07 DIAGNOSIS — M170 Bilateral primary osteoarthritis of knee: Secondary | ICD-10-CM

## 2018-09-07 DIAGNOSIS — I5189 Other ill-defined heart diseases: Secondary | ICD-10-CM

## 2018-09-07 DIAGNOSIS — Z9109 Other allergy status, other than to drugs and biological substances: Secondary | ICD-10-CM

## 2018-09-07 DIAGNOSIS — N183 Chronic kidney disease, stage 3 (moderate): Secondary | ICD-10-CM

## 2018-09-07 DIAGNOSIS — F419 Anxiety disorder, unspecified: Secondary | ICD-10-CM

## 2018-09-07 DIAGNOSIS — E785 Hyperlipidemia, unspecified: Secondary | ICD-10-CM

## 2018-09-07 DIAGNOSIS — M542 Cervicalgia: Secondary | ICD-10-CM

## 2018-09-07 DIAGNOSIS — I10 Essential (primary) hypertension: Principal | ICD-10-CM

## 2018-09-07 DIAGNOSIS — Z85820 Personal history of malignant melanoma of skin: Secondary | ICD-10-CM

## 2018-09-07 DIAGNOSIS — I2510 Atherosclerotic heart disease of native coronary artery without angina pectoris: Secondary | ICD-10-CM

## 2018-09-07 DIAGNOSIS — N189 Chronic kidney disease, unspecified: Secondary | ICD-10-CM

## 2018-09-07 DIAGNOSIS — E119 Type 2 diabetes mellitus without complications: Secondary | ICD-10-CM

## 2018-09-07 DIAGNOSIS — Z9189 Other specified personal risk factors, not elsewhere classified: Secondary | ICD-10-CM

## 2018-09-07 MED ORDER — LOSARTAN POTASSIUM 25 MG PO TABS
25 mg | Freq: Every day | ORAL | 3 refills | 30.00 days | Status: CP
Start: 2018-09-07 — End: 2018-10-06

## 2018-09-07 MED ORDER — HYDROCODONE-ACETAMINOPHEN 5-325 MG PO TABS
1 | ORAL_TABLET | Freq: Four times a day (QID) | ORAL | 0 refills | 30.00000 days | Status: CP | PRN
Start: 2018-09-07 — End: 2018-12-11

## 2018-09-07 MED ORDER — HYDROCODONE-ACETAMINOPHEN 5-325 MG PO TABS
1 | ORAL_TABLET | Freq: Four times a day (QID) | ORAL | 0 refills | 30.00000 days | Status: CP | PRN
Start: 2018-09-07 — End: 2018-09-08

## 2018-09-26 DIAGNOSIS — I25118 Atherosclerotic heart disease of native coronary artery with other forms of angina pectoris: Principal | ICD-10-CM

## 2018-09-27 MED ORDER — CLOPIDOGREL BISULFATE 75 MG PO TABS
ORAL | 0 refills | 90.00000 days | Status: CP
Start: 2018-09-27 — End: 2018-12-05

## 2018-09-28 DIAGNOSIS — E1151 Type 2 diabetes mellitus with diabetic peripheral angiopathy without gangrene: Secondary | ICD-10-CM

## 2018-09-28 MED ORDER — INSULIN ASPART 100 UNIT/ML SC SOLN
4 refills | Status: CN
Start: 2018-09-28 — End: ?

## 2018-10-06 ENCOUNTER — Ambulatory Visit: Attending: Family Medicine | Primary: Family Medicine

## 2018-10-06 DIAGNOSIS — F419 Anxiety disorder, unspecified: Secondary | ICD-10-CM

## 2018-10-06 DIAGNOSIS — N189 Chronic kidney disease, unspecified: Secondary | ICD-10-CM

## 2018-10-06 DIAGNOSIS — G629 Polyneuropathy, unspecified: Secondary | ICD-10-CM

## 2018-10-06 DIAGNOSIS — Z794 Long term (current) use of insulin: Secondary | ICD-10-CM

## 2018-10-06 DIAGNOSIS — I10 Essential (primary) hypertension: Secondary | ICD-10-CM

## 2018-10-06 DIAGNOSIS — M1990 Unspecified osteoarthritis, unspecified site: Secondary | ICD-10-CM

## 2018-10-06 DIAGNOSIS — J189 Pneumonia, unspecified organism: Secondary | ICD-10-CM

## 2018-10-06 DIAGNOSIS — M542 Cervicalgia: Secondary | ICD-10-CM

## 2018-10-06 DIAGNOSIS — I739 Peripheral vascular disease, unspecified: Secondary | ICD-10-CM

## 2018-10-06 DIAGNOSIS — Z9109 Other allergy status, other than to drugs and biological substances: Secondary | ICD-10-CM

## 2018-10-06 DIAGNOSIS — C4491 Basal cell carcinoma of skin, unspecified: Secondary | ICD-10-CM

## 2018-10-06 DIAGNOSIS — I219 Acute myocardial infarction, unspecified: Secondary | ICD-10-CM

## 2018-10-06 DIAGNOSIS — Z9189 Other specified personal risk factors, not elsewhere classified: Secondary | ICD-10-CM

## 2018-10-06 DIAGNOSIS — E119 Type 2 diabetes mellitus without complications: Secondary | ICD-10-CM

## 2018-10-06 DIAGNOSIS — E1142 Type 2 diabetes mellitus with diabetic polyneuropathy: Secondary | ICD-10-CM

## 2018-10-06 DIAGNOSIS — E785 Hyperlipidemia, unspecified: Secondary | ICD-10-CM

## 2018-10-06 DIAGNOSIS — C439 Malignant melanoma of skin, unspecified: Secondary | ICD-10-CM

## 2018-10-06 DIAGNOSIS — I2510 Atherosclerotic heart disease of native coronary artery without angina pectoris: Secondary | ICD-10-CM

## 2018-10-06 DIAGNOSIS — N400 Enlarged prostate without lower urinary tract symptoms: Secondary | ICD-10-CM

## 2018-10-06 DIAGNOSIS — K219 Gastro-esophageal reflux disease without esophagitis: Secondary | ICD-10-CM

## 2018-10-06 MED ORDER — LOSARTAN POTASSIUM 50 MG PO TABS
50 mg | Freq: Every day | ORAL | 3 refills | Status: CP
Start: 2018-10-06 — End: 2018-12-11

## 2018-10-06 MED ORDER — AZITHROMYCIN 250 MG PO TABS
ORAL | 0 refills | 3.00000 days | Status: CP
Start: 2018-10-06 — End: 2018-12-11

## 2018-10-06 MED ORDER — INSULIN ASPART 100 UNIT/ML SC SOLN
SUBCUTANEOUS | 0 refills | 33.00000 days | Status: CP
Start: 2018-10-06 — End: 2018-12-11

## 2018-10-06 MED ORDER — HYDROCHLOROTHIAZIDE 12.5 MG PO CAPS
12.5 mg | Freq: Two times a day (BID) | ORAL | 3 refills | Status: CP
Start: 2018-10-06 — End: 2018-12-11

## 2018-10-06 MED ORDER — METOPROLOL SUCCINATE ER 50 MG PO TB24
50 mg | Freq: Two times a day (BID) | ORAL | 3 refills | Status: CP
Start: 2018-10-06 — End: 2018-12-11

## 2018-10-10 ENCOUNTER — Encounter: Attending: Internal Medicine | Primary: Family Medicine

## 2018-10-17 MED ORDER — SULFAMETHOXAZOLE-TRIMETHOPRIM 800-160 MG PO TABS
1 | ORAL_TABLET | Freq: Two times a day (BID) | ORAL | 0 refills | Status: CP
Start: 2018-10-17 — End: 2018-12-11

## 2018-11-13 MED ORDER — GABAPENTIN 600 MG PO TABS
0 refills | Status: CP
Start: 2018-11-13 — End: 2018-12-11

## 2018-12-05 DIAGNOSIS — I25118 Atherosclerotic heart disease of native coronary artery with other forms of angina pectoris: Principal | ICD-10-CM

## 2018-12-05 MED ORDER — CLOPIDOGREL BISULFATE 75 MG PO TABS
0 refills | Status: CP
Start: 2018-12-05 — End: ?

## 2018-12-11 ENCOUNTER — Telehealth: Attending: Family Medicine | Primary: Family Medicine

## 2018-12-11 DIAGNOSIS — I5189 Other ill-defined heart diseases: Secondary | ICD-10-CM

## 2018-12-11 DIAGNOSIS — N400 Enlarged prostate without lower urinary tract symptoms: Secondary | ICD-10-CM

## 2018-12-11 DIAGNOSIS — I10 Essential (primary) hypertension: Secondary | ICD-10-CM

## 2018-12-11 DIAGNOSIS — N432 Other hydrocele: Secondary | ICD-10-CM

## 2018-12-11 DIAGNOSIS — I25810 Atherosclerosis of coronary artery bypass graft(s) without angina pectoris: Secondary | ICD-10-CM

## 2018-12-11 DIAGNOSIS — N183 Chronic kidney disease, stage 3 (moderate): Secondary | ICD-10-CM

## 2018-12-11 DIAGNOSIS — E1151 Type 2 diabetes mellitus with diabetic peripheral angiopathy without gangrene: Secondary | ICD-10-CM

## 2018-12-11 DIAGNOSIS — Z951 Presence of aortocoronary bypass graft: Secondary | ICD-10-CM

## 2018-12-11 DIAGNOSIS — M542 Cervicalgia: Principal | ICD-10-CM

## 2018-12-11 DIAGNOSIS — Z981 Arthrodesis status: Secondary | ICD-10-CM

## 2018-12-11 MED ORDER — ONETOUCH ULTRA BLUE VI STRP
ORAL_STRIP | 11 refills | Status: CP
Start: 2018-12-11 — End: ?

## 2018-12-11 MED ORDER — LOSARTAN POTASSIUM 25 MG PO TABS
25 mg | Freq: Two times a day (BID) | ORAL | 2 refills | Status: CP
Start: 2018-12-11 — End: ?

## 2018-12-11 MED ORDER — METOPROLOL SUCCINATE ER 50 MG PO TB24
50 mg | Freq: Two times a day (BID) | ORAL | 3 refills | Status: CP
Start: 2018-12-11 — End: ?

## 2018-12-11 MED ORDER — TAMSULOSIN HCL 0.4 MG PO CAPS
.4 mg | Freq: Every day | ORAL | 3 refills | Status: CP
Start: 2018-12-11 — End: ?

## 2018-12-11 MED ORDER — HYDROCHLOROTHIAZIDE 12.5 MG PO CAPS
12.5 mg | Freq: Two times a day (BID) | ORAL | 3 refills | Status: CP
Start: 2018-12-11 — End: ?

## 2018-12-11 MED ORDER — HYDROCODONE-ACETAMINOPHEN 5-325 MG PO TABS
1 | ORAL_TABLET | Freq: Four times a day (QID) | ORAL | 0 refills | Status: CP | PRN
Start: 2018-12-11 — End: 2018-12-11

## 2018-12-11 MED ORDER — INSULIN ASPART 100 UNIT/ML SC SOLN
0 refills | Status: CP
Start: 2018-12-11 — End: ?

## 2018-12-11 MED ORDER — GABAPENTIN 600 MG PO TABS
600 mg | Freq: Three times a day (TID) | ORAL | 0 refills | Status: CP
Start: 2018-12-11 — End: ?

## 2018-12-11 MED ORDER — HYDROCODONE-ACETAMINOPHEN 5-325 MG PO TABS
1 | ORAL_TABLET | Freq: Four times a day (QID) | ORAL | 0 refills | Status: CP | PRN
Start: 2018-12-11 — End: ?

## 2018-12-14 ENCOUNTER — Encounter: Attending: Internal Medicine | Primary: Family Medicine

## 2018-12-15 ENCOUNTER — Ambulatory Visit: Attending: Internal Medicine | Primary: Family Medicine

## 2018-12-15 DIAGNOSIS — L989 Disorder of the skin and subcutaneous tissue, unspecified: Secondary | ICD-10-CM

## 2018-12-15 DIAGNOSIS — M1990 Unspecified osteoarthritis, unspecified site: Secondary | ICD-10-CM

## 2018-12-15 DIAGNOSIS — N189 Chronic kidney disease, unspecified: Secondary | ICD-10-CM

## 2018-12-15 DIAGNOSIS — Z1283 Encounter for screening for malignant neoplasm of skin: Principal | ICD-10-CM

## 2018-12-15 DIAGNOSIS — E785 Hyperlipidemia, unspecified: Secondary | ICD-10-CM

## 2018-12-15 DIAGNOSIS — E119 Type 2 diabetes mellitus without complications: Secondary | ICD-10-CM

## 2018-12-15 DIAGNOSIS — C4491 Basal cell carcinoma of skin, unspecified: Secondary | ICD-10-CM

## 2018-12-15 DIAGNOSIS — I219 Acute myocardial infarction, unspecified: Secondary | ICD-10-CM

## 2018-12-15 DIAGNOSIS — I10 Essential (primary) hypertension: Principal | ICD-10-CM

## 2018-12-15 DIAGNOSIS — Z85820 Personal history of malignant melanoma of skin: Secondary | ICD-10-CM

## 2018-12-15 DIAGNOSIS — C439 Malignant melanoma of skin, unspecified: Secondary | ICD-10-CM

## 2018-12-15 DIAGNOSIS — M542 Cervicalgia: Secondary | ICD-10-CM

## 2018-12-15 DIAGNOSIS — L821 Other seborrheic keratosis: Secondary | ICD-10-CM

## 2018-12-15 DIAGNOSIS — L578 Other skin changes due to chronic exposure to nonionizing radiation: Secondary | ICD-10-CM

## 2018-12-15 DIAGNOSIS — F419 Anxiety disorder, unspecified: Secondary | ICD-10-CM

## 2018-12-15 DIAGNOSIS — L570 Actinic keratosis: Secondary | ICD-10-CM

## 2018-12-15 DIAGNOSIS — Z85828 Personal history of other malignant neoplasm of skin: Secondary | ICD-10-CM

## 2018-12-15 DIAGNOSIS — Z9189 Other specified personal risk factors, not elsewhere classified: Secondary | ICD-10-CM

## 2018-12-15 DIAGNOSIS — G629 Polyneuropathy, unspecified: Secondary | ICD-10-CM

## 2018-12-15 DIAGNOSIS — L814 Other melanin hyperpigmentation: Secondary | ICD-10-CM

## 2018-12-15 DIAGNOSIS — N400 Enlarged prostate without lower urinary tract symptoms: Secondary | ICD-10-CM

## 2018-12-15 DIAGNOSIS — Z9109 Other allergy status, other than to drugs and biological substances: Secondary | ICD-10-CM

## 2018-12-15 DIAGNOSIS — K219 Gastro-esophageal reflux disease without esophagitis: Secondary | ICD-10-CM

## 2018-12-15 DIAGNOSIS — I739 Peripheral vascular disease, unspecified: Secondary | ICD-10-CM

## 2018-12-15 DIAGNOSIS — I2510 Atherosclerotic heart disease of native coronary artery without angina pectoris: Secondary | ICD-10-CM

## 2018-12-22 ENCOUNTER — Encounter: Attending: "Endocrinology | Primary: Family Medicine

## 2018-12-27 ENCOUNTER — Encounter: Attending: "Endocrinology | Primary: Family Medicine

## 2019-01-03 ENCOUNTER — Encounter: Attending: "Endocrinology | Primary: Family Medicine

## 2019-01-03 ENCOUNTER — Ambulatory Visit: Attending: Internal Medicine | Primary: Family Medicine

## 2019-01-03 DIAGNOSIS — D044 Carcinoma in situ of skin of scalp and neck: Principal | ICD-10-CM

## 2019-01-03 DIAGNOSIS — Z9109 Other allergy status, other than to drugs and biological substances: Secondary | ICD-10-CM

## 2019-01-03 DIAGNOSIS — I739 Peripheral vascular disease, unspecified: Secondary | ICD-10-CM

## 2019-01-03 DIAGNOSIS — K219 Gastro-esophageal reflux disease without esophagitis: Secondary | ICD-10-CM

## 2019-01-03 DIAGNOSIS — C4491 Basal cell carcinoma of skin, unspecified: Secondary | ICD-10-CM

## 2019-01-03 DIAGNOSIS — M542 Cervicalgia: Secondary | ICD-10-CM

## 2019-01-03 DIAGNOSIS — Z9189 Other specified personal risk factors, not elsewhere classified: Secondary | ICD-10-CM

## 2019-01-03 DIAGNOSIS — N400 Enlarged prostate without lower urinary tract symptoms: Secondary | ICD-10-CM

## 2019-01-03 DIAGNOSIS — M1990 Unspecified osteoarthritis, unspecified site: Secondary | ICD-10-CM

## 2019-01-03 DIAGNOSIS — C4492 Squamous cell carcinoma of skin, unspecified: Secondary | ICD-10-CM

## 2019-01-03 DIAGNOSIS — L821 Other seborrheic keratosis: Secondary | ICD-10-CM

## 2019-01-03 DIAGNOSIS — I219 Acute myocardial infarction, unspecified: Secondary | ICD-10-CM

## 2019-01-03 DIAGNOSIS — C439 Malignant melanoma of skin, unspecified: Secondary | ICD-10-CM

## 2019-01-03 DIAGNOSIS — G629 Polyneuropathy, unspecified: Secondary | ICD-10-CM

## 2019-01-03 DIAGNOSIS — E119 Type 2 diabetes mellitus without complications: Secondary | ICD-10-CM

## 2019-01-03 DIAGNOSIS — E785 Hyperlipidemia, unspecified: Secondary | ICD-10-CM

## 2019-01-03 DIAGNOSIS — I2510 Atherosclerotic heart disease of native coronary artery without angina pectoris: Secondary | ICD-10-CM

## 2019-01-03 DIAGNOSIS — F419 Anxiety disorder, unspecified: Secondary | ICD-10-CM

## 2019-01-03 DIAGNOSIS — I10 Essential (primary) hypertension: Principal | ICD-10-CM

## 2019-01-03 DIAGNOSIS — N189 Chronic kidney disease, unspecified: Secondary | ICD-10-CM

## 2019-01-25 DIAGNOSIS — I25810 Atherosclerosis of coronary artery bypass graft(s) without angina pectoris: Principal | ICD-10-CM

## 2019-01-25 DIAGNOSIS — Z86718 Personal history of other venous thrombosis and embolism: Secondary | ICD-10-CM

## 2019-01-30 DIAGNOSIS — M542 Cervicalgia: Principal | ICD-10-CM

## 2019-01-30 MED ORDER — IPRATROPIUM BROMIDE 0.06 % NA SOLN
6 refills
Start: 2019-01-30 — End: ?

## 2019-01-30 MED ORDER — IPRATROPIUM BROMIDE 0.06 % NA SOLN
NASAL | 6 refills | Status: CP
Start: 2019-01-30 — End: ?

## 2019-01-30 MED ORDER — HYDROCODONE-ACETAMINOPHEN 5-325 MG PO TABS
1 | ORAL_TABLET | Freq: Four times a day (QID) | ORAL | 0 refills | PRN
Start: 2019-01-30 — End: ?

## 2019-02-05 DIAGNOSIS — I25118 Atherosclerotic heart disease of native coronary artery with other forms of angina pectoris: Principal | ICD-10-CM

## 2019-02-05 MED ORDER — CLOPIDOGREL BISULFATE 75 MG PO TABS
ORAL | 0 refills | 90.00000 days | Status: CP
Start: 2019-02-05 — End: ?

## 2019-02-14 ENCOUNTER — Telehealth: Attending: Family Medicine | Primary: Family Medicine

## 2019-02-14 DIAGNOSIS — N400 Enlarged prostate without lower urinary tract symptoms: Secondary | ICD-10-CM

## 2019-02-14 DIAGNOSIS — K219 Gastro-esophageal reflux disease without esophagitis: Secondary | ICD-10-CM

## 2019-02-14 DIAGNOSIS — I25118 Atherosclerotic heart disease of native coronary artery with other forms of angina pectoris: Secondary | ICD-10-CM

## 2019-02-14 DIAGNOSIS — N183 Chronic kidney disease, stage 3 (moderate): Secondary | ICD-10-CM

## 2019-02-14 DIAGNOSIS — I10 Essential (primary) hypertension: Secondary | ICD-10-CM

## 2019-02-14 DIAGNOSIS — M542 Cervicalgia: Principal | ICD-10-CM

## 2019-02-14 DIAGNOSIS — E538 Deficiency of other specified B group vitamins: Secondary | ICD-10-CM

## 2019-02-14 DIAGNOSIS — E785 Hyperlipidemia, unspecified: Secondary | ICD-10-CM

## 2019-02-14 MED ORDER — HYDROCODONE-ACETAMINOPHEN 5-325 MG PO TABS
1 | ORAL_TABLET | Freq: Four times a day (QID) | ORAL | 0 refills | 30.00000 days | Status: CP | PRN
Start: 2019-02-14 — End: ?

## 2019-02-14 MED ORDER — HYDROCODONE-ACETAMINOPHEN 5-325 MG PO TABS
1 | ORAL_TABLET | Freq: Four times a day (QID) | ORAL | 0 refills | 30.00000 days | Status: CP | PRN
Start: 2019-02-14 — End: 2019-02-14

## 2019-02-14 MED ORDER — ATORVASTATIN CALCIUM 40 MG PO TABS
40 mg | Freq: Every day | ORAL | 3 refills | 90.00 days | Status: CP
Start: 2019-02-14 — End: ?

## 2019-02-14 MED ORDER — HYDROCHLOROTHIAZIDE 12.5 MG PO CAPS
3 refills | Status: CP
Start: 2019-02-14 — End: ?

## 2019-02-19 DIAGNOSIS — I6523 Occlusion and stenosis of bilateral carotid arteries: Principal | ICD-10-CM

## 2019-02-28 ENCOUNTER — Encounter: Attending: "Endocrinology | Primary: Family Medicine

## 2019-03-05 ENCOUNTER — Inpatient Hospital Stay: Admit: 2019-03-05 | Discharge: 2019-03-06 | Primary: Family Medicine

## 2019-03-05 DIAGNOSIS — I6523 Occlusion and stenosis of bilateral carotid arteries: Principal | ICD-10-CM

## 2019-03-07 ENCOUNTER — Encounter: Attending: "Endocrinology | Primary: Family Medicine

## 2019-03-26 MED ORDER — TAMSULOSIN HCL 0.4 MG PO CAPS
.4 mg | Freq: Every day | ORAL | 3 refills | 30.00 days | Status: CP
Start: 2019-03-26 — End: ?

## 2019-04-18 ENCOUNTER — Ambulatory Visit: Attending: "Endocrinology | Primary: Family Medicine

## 2019-04-18 DIAGNOSIS — Z9109 Other allergy status, other than to drugs and biological substances: Secondary | ICD-10-CM

## 2019-04-18 DIAGNOSIS — Z794 Long term (current) use of insulin: Secondary | ICD-10-CM

## 2019-04-18 DIAGNOSIS — M542 Cervicalgia: Secondary | ICD-10-CM

## 2019-04-18 DIAGNOSIS — E7849 Other hyperlipidemia: Secondary | ICD-10-CM

## 2019-04-18 DIAGNOSIS — Z9189 Other specified personal risk factors, not elsewhere classified: Secondary | ICD-10-CM

## 2019-04-18 DIAGNOSIS — G629 Polyneuropathy, unspecified: Secondary | ICD-10-CM

## 2019-04-18 DIAGNOSIS — N183 Chronic kidney disease, stage 3 (moderate): Secondary | ICD-10-CM

## 2019-04-18 DIAGNOSIS — I10 Essential (primary) hypertension: Principal | ICD-10-CM

## 2019-04-18 DIAGNOSIS — C4491 Basal cell carcinoma of skin, unspecified: Secondary | ICD-10-CM

## 2019-04-18 DIAGNOSIS — I219 Acute myocardial infarction, unspecified: Secondary | ICD-10-CM

## 2019-04-18 DIAGNOSIS — E785 Hyperlipidemia, unspecified: Secondary | ICD-10-CM

## 2019-04-18 DIAGNOSIS — M1990 Unspecified osteoarthritis, unspecified site: Secondary | ICD-10-CM

## 2019-04-18 DIAGNOSIS — E1142 Type 2 diabetes mellitus with diabetic polyneuropathy: Principal | ICD-10-CM

## 2019-04-18 DIAGNOSIS — K219 Gastro-esophageal reflux disease without esophagitis: Secondary | ICD-10-CM

## 2019-04-18 DIAGNOSIS — C4492 Squamous cell carcinoma of skin, unspecified: Secondary | ICD-10-CM

## 2019-04-18 DIAGNOSIS — C439 Malignant melanoma of skin, unspecified: Secondary | ICD-10-CM

## 2019-04-18 DIAGNOSIS — I739 Peripheral vascular disease, unspecified: Secondary | ICD-10-CM

## 2019-04-18 DIAGNOSIS — I2510 Atherosclerotic heart disease of native coronary artery without angina pectoris: Secondary | ICD-10-CM

## 2019-04-18 DIAGNOSIS — F419 Anxiety disorder, unspecified: Secondary | ICD-10-CM

## 2019-04-18 DIAGNOSIS — E119 Type 2 diabetes mellitus without complications: Secondary | ICD-10-CM

## 2019-04-18 DIAGNOSIS — N189 Chronic kidney disease, unspecified: Secondary | ICD-10-CM

## 2019-04-18 DIAGNOSIS — N400 Enlarged prostate without lower urinary tract symptoms: Secondary | ICD-10-CM

## 2019-04-30 DIAGNOSIS — I25118 Atherosclerotic heart disease of native coronary artery with other forms of angina pectoris: Principal | ICD-10-CM

## 2019-05-02 MED ORDER — CLOPIDOGREL BISULFATE 75 MG PO TABS
ORAL | 0 refills | 90.00000 days | Status: CP
Start: 2019-05-02 — End: ?

## 2019-05-02 MED ORDER — GABAPENTIN 600 MG PO TABS
ORAL | 0 refills | 30.00000 days | Status: CP
Start: 2019-05-02 — End: ?

## 2019-05-22 ENCOUNTER — Ambulatory Visit: Admit: 2019-05-22 | Discharge: 2019-05-23 | Primary: Family Medicine

## 2019-05-22 DIAGNOSIS — N183 Stage 3 chronic kidney disease, unspecified whether stage 3a or 3b CKD: Principal | ICD-10-CM

## 2019-05-31 ENCOUNTER — Encounter: Attending: "Endocrinology | Primary: Family Medicine

## 2019-06-01 ENCOUNTER — Ambulatory Visit: Attending: Family | Primary: Family Medicine

## 2019-06-01 DIAGNOSIS — Z9109 Other allergy status, other than to drugs and biological substances: Secondary | ICD-10-CM

## 2019-06-01 DIAGNOSIS — F419 Anxiety disorder, unspecified: Secondary | ICD-10-CM

## 2019-06-01 DIAGNOSIS — E785 Hyperlipidemia, unspecified: Secondary | ICD-10-CM

## 2019-06-01 DIAGNOSIS — N433 Hydrocele, unspecified: Principal | ICD-10-CM

## 2019-06-01 DIAGNOSIS — N189 Chronic kidney disease, unspecified: Secondary | ICD-10-CM

## 2019-06-01 DIAGNOSIS — M1990 Unspecified osteoarthritis, unspecified site: Secondary | ICD-10-CM

## 2019-06-01 DIAGNOSIS — N400 Enlarged prostate without lower urinary tract symptoms: Secondary | ICD-10-CM

## 2019-06-01 DIAGNOSIS — C4491 Basal cell carcinoma of skin, unspecified: Secondary | ICD-10-CM

## 2019-06-01 DIAGNOSIS — M542 Cervicalgia: Secondary | ICD-10-CM

## 2019-06-01 DIAGNOSIS — Z9189 Other specified personal risk factors, not elsewhere classified: Secondary | ICD-10-CM

## 2019-06-01 DIAGNOSIS — G629 Polyneuropathy, unspecified: Secondary | ICD-10-CM

## 2019-06-01 DIAGNOSIS — C439 Malignant melanoma of skin, unspecified: Secondary | ICD-10-CM

## 2019-06-01 DIAGNOSIS — I219 Acute myocardial infarction, unspecified: Secondary | ICD-10-CM

## 2019-06-01 DIAGNOSIS — I10 Essential (primary) hypertension: Principal | ICD-10-CM

## 2019-06-01 DIAGNOSIS — I739 Peripheral vascular disease, unspecified: Secondary | ICD-10-CM

## 2019-06-01 DIAGNOSIS — C4492 Squamous cell carcinoma of skin, unspecified: Secondary | ICD-10-CM

## 2019-06-01 DIAGNOSIS — K219 Gastro-esophageal reflux disease without esophagitis: Secondary | ICD-10-CM

## 2019-06-01 DIAGNOSIS — E119 Type 2 diabetes mellitus without complications: Secondary | ICD-10-CM

## 2019-06-01 DIAGNOSIS — I2510 Atherosclerotic heart disease of native coronary artery without angina pectoris: Secondary | ICD-10-CM

## 2019-06-05 ENCOUNTER — Ambulatory Visit: Attending: "Endocrinology | Primary: Family Medicine

## 2019-06-05 DIAGNOSIS — Z125 Encounter for screening for malignant neoplasm of prostate: Secondary | ICD-10-CM

## 2019-06-05 DIAGNOSIS — E1142 Type 2 diabetes mellitus with diabetic polyneuropathy: Principal | ICD-10-CM

## 2019-06-05 DIAGNOSIS — F419 Anxiety disorder, unspecified: Secondary | ICD-10-CM

## 2019-06-05 DIAGNOSIS — Z794 Long term (current) use of insulin: Secondary | ICD-10-CM

## 2019-06-05 DIAGNOSIS — M1990 Unspecified osteoarthritis, unspecified site: Secondary | ICD-10-CM

## 2019-06-05 DIAGNOSIS — E785 Hyperlipidemia, unspecified: Secondary | ICD-10-CM

## 2019-06-05 DIAGNOSIS — I2510 Atherosclerotic heart disease of native coronary artery without angina pectoris: Secondary | ICD-10-CM

## 2019-06-05 DIAGNOSIS — I739 Peripheral vascular disease, unspecified: Secondary | ICD-10-CM

## 2019-06-05 DIAGNOSIS — E7849 Other hyperlipidemia: Secondary | ICD-10-CM

## 2019-06-05 DIAGNOSIS — I219 Acute myocardial infarction, unspecified: Secondary | ICD-10-CM

## 2019-06-05 DIAGNOSIS — I10 Essential (primary) hypertension: Secondary | ICD-10-CM

## 2019-06-05 DIAGNOSIS — Z9189 Other specified personal risk factors, not elsewhere classified: Secondary | ICD-10-CM

## 2019-06-05 DIAGNOSIS — N1832 Stage 3b chronic kidney disease: Secondary | ICD-10-CM

## 2019-06-05 DIAGNOSIS — M542 Cervicalgia: Secondary | ICD-10-CM

## 2019-06-05 DIAGNOSIS — E119 Type 2 diabetes mellitus without complications: Secondary | ICD-10-CM

## 2019-06-05 DIAGNOSIS — C4492 Squamous cell carcinoma of skin, unspecified: Secondary | ICD-10-CM

## 2019-06-05 DIAGNOSIS — C439 Malignant melanoma of skin, unspecified: Secondary | ICD-10-CM

## 2019-06-05 DIAGNOSIS — N189 Chronic kidney disease, unspecified: Secondary | ICD-10-CM

## 2019-06-05 DIAGNOSIS — N400 Enlarged prostate without lower urinary tract symptoms: Secondary | ICD-10-CM

## 2019-06-05 DIAGNOSIS — Z9109 Other allergy status, other than to drugs and biological substances: Secondary | ICD-10-CM

## 2019-06-05 DIAGNOSIS — C4491 Basal cell carcinoma of skin, unspecified: Secondary | ICD-10-CM

## 2019-06-05 DIAGNOSIS — G629 Polyneuropathy, unspecified: Secondary | ICD-10-CM

## 2019-06-05 DIAGNOSIS — K219 Gastro-esophageal reflux disease without esophagitis: Secondary | ICD-10-CM

## 2019-06-05 MED ORDER — INSULIN ASPART 100 UNIT/ML SC SOLN
SUBCUTANEOUS | 0 refills | 33.00000 days | Status: CP
Start: 2019-06-05 — End: ?

## 2019-06-05 MED ORDER — LOSARTAN POTASSIUM 50 MG PO TABS
50 mg | Freq: Every day | ORAL
Start: 2019-06-05 — End: ?

## 2019-06-05 MED ORDER — METFORMIN HCL 1000 MG PO TABS
1000 mg | Freq: Every day | ORAL | 3 refills | Status: CP
Start: 2019-06-05 — End: ?

## 2019-06-11 ENCOUNTER — Telehealth: Attending: Family Medicine | Primary: Family Medicine

## 2019-06-11 ENCOUNTER — Inpatient Hospital Stay: Admit: 2019-06-11 | Discharge: 2019-06-12 | Primary: Family Medicine

## 2019-06-11 DIAGNOSIS — N1832 Stage 3b chronic kidney disease: Principal | ICD-10-CM

## 2019-06-11 DIAGNOSIS — N183 Stage 3 chronic kidney disease, unspecified whether stage 3a or 3b CKD: Secondary | ICD-10-CM

## 2019-06-11 DIAGNOSIS — N400 Enlarged prostate without lower urinary tract symptoms: Secondary | ICD-10-CM

## 2019-06-11 DIAGNOSIS — Z951 Presence of aortocoronary bypass graft: Secondary | ICD-10-CM

## 2019-06-11 DIAGNOSIS — M542 Cervicalgia: Principal | ICD-10-CM

## 2019-06-11 DIAGNOSIS — E1151 Type 2 diabetes mellitus with diabetic peripheral angiopathy without gangrene: Secondary | ICD-10-CM

## 2019-06-11 DIAGNOSIS — I25810 Atherosclerosis of coronary artery bypass graft(s) without angina pectoris: Secondary | ICD-10-CM

## 2019-06-11 MED ORDER — HYDROCODONE-ACETAMINOPHEN 5-325 MG PO TABS
1 | ORAL_TABLET | Freq: Four times a day (QID) | ORAL | 0 refills | 30.00000 days | Status: CP | PRN
Start: 2019-06-11 — End: ?

## 2019-06-11 MED ORDER — HYDROCODONE-ACETAMINOPHEN 5-325 MG PO TABS
1 | ORAL_TABLET | Freq: Four times a day (QID) | ORAL | 0 refills | 30.00000 days | Status: CP | PRN
Start: 2019-06-11 — End: 2019-06-11

## 2019-06-11 MED ORDER — INSULIN ASPART 100 UNIT/ML SC SOLN
SUBCUTANEOUS | 1 refills | 33.00000 days | Status: CP
Start: 2019-06-11 — End: ?

## 2019-06-12 ENCOUNTER — Encounter: Primary: Family Medicine

## 2019-07-06 DIAGNOSIS — I25118 Atherosclerotic heart disease of native coronary artery with other forms of angina pectoris: Principal | ICD-10-CM

## 2019-07-06 DIAGNOSIS — J302 Other seasonal allergic rhinitis: Principal | ICD-10-CM

## 2019-07-06 MED ORDER — FLUTICASONE PROPIONATE 50 MCG/ACT NA SUSP
0 refills | 30.00 days | Status: CP
Start: 2019-07-06 — End: ?

## 2019-07-07 MED ORDER — CLOPIDOGREL BISULFATE 75 MG PO TABS
ORAL | 0 refills | 90.00000 days | Status: CP
Start: 2019-07-07 — End: ?

## 2019-07-09 ENCOUNTER — Ambulatory Visit: Payer: Medicare Other | Attending: Family Medicine | Primary: Family Medicine

## 2019-07-09 DIAGNOSIS — Z981 Arthrodesis status: Secondary | ICD-10-CM

## 2019-07-09 DIAGNOSIS — K219 Gastro-esophageal reflux disease without esophagitis: Secondary | ICD-10-CM

## 2019-07-09 DIAGNOSIS — Z951 Presence of aortocoronary bypass graft: Secondary | ICD-10-CM

## 2019-07-09 DIAGNOSIS — Z85820 Personal history of malignant melanoma of skin: Secondary | ICD-10-CM

## 2019-07-09 DIAGNOSIS — E119 Type 2 diabetes mellitus without complications: Secondary | ICD-10-CM

## 2019-07-09 DIAGNOSIS — N400 Enlarged prostate without lower urinary tract symptoms: Secondary | ICD-10-CM

## 2019-07-09 DIAGNOSIS — E1151 Type 2 diabetes mellitus with diabetic peripheral angiopathy without gangrene: Secondary | ICD-10-CM

## 2019-07-09 DIAGNOSIS — M542 Cervicalgia: Secondary | ICD-10-CM

## 2019-07-09 DIAGNOSIS — G629 Polyneuropathy, unspecified: Secondary | ICD-10-CM

## 2019-07-09 DIAGNOSIS — F419 Anxiety disorder, unspecified: Secondary | ICD-10-CM

## 2019-07-09 DIAGNOSIS — I25810 Atherosclerosis of coronary artery bypass graft(s) without angina pectoris: Secondary | ICD-10-CM

## 2019-07-09 DIAGNOSIS — Z9189 Other specified personal risk factors, not elsewhere classified: Secondary | ICD-10-CM

## 2019-07-09 DIAGNOSIS — I739 Peripheral vascular disease, unspecified: Secondary | ICD-10-CM

## 2019-07-09 DIAGNOSIS — C439 Malignant melanoma of skin, unspecified: Secondary | ICD-10-CM

## 2019-07-09 DIAGNOSIS — Z9641 Presence of insulin pump (external) (internal): Secondary | ICD-10-CM

## 2019-07-09 DIAGNOSIS — N183 Stage 3 chronic kidney disease, unspecified whether stage 3a or 3b CKD: Secondary | ICD-10-CM

## 2019-07-09 DIAGNOSIS — E538 Deficiency of other specified B group vitamins: Secondary | ICD-10-CM

## 2019-07-09 DIAGNOSIS — Z6827 Body mass index (BMI) 27.0-27.9, adult: Principal | ICD-10-CM

## 2019-07-09 DIAGNOSIS — E785 Hyperlipidemia, unspecified: Secondary | ICD-10-CM

## 2019-07-09 DIAGNOSIS — I219 Acute myocardial infarction, unspecified: Secondary | ICD-10-CM

## 2019-07-09 DIAGNOSIS — C4492 Squamous cell carcinoma of skin, unspecified: Secondary | ICD-10-CM

## 2019-07-09 DIAGNOSIS — M1990 Unspecified osteoarthritis, unspecified site: Secondary | ICD-10-CM

## 2019-07-09 DIAGNOSIS — N189 Chronic kidney disease, unspecified: Secondary | ICD-10-CM

## 2019-07-09 DIAGNOSIS — J302 Other seasonal allergic rhinitis: Secondary | ICD-10-CM

## 2019-07-09 DIAGNOSIS — C4491 Basal cell carcinoma of skin, unspecified: Secondary | ICD-10-CM

## 2019-07-09 DIAGNOSIS — I10 Essential (primary) hypertension: Principal | ICD-10-CM

## 2019-07-09 DIAGNOSIS — M170 Bilateral primary osteoarthritis of knee: Secondary | ICD-10-CM

## 2019-07-09 DIAGNOSIS — Z9109 Other allergy status, other than to drugs and biological substances: Secondary | ICD-10-CM

## 2019-07-09 DIAGNOSIS — I2510 Atherosclerotic heart disease of native coronary artery without angina pectoris: Secondary | ICD-10-CM

## 2019-07-09 MED ORDER — HYDROCODONE-ACETAMINOPHEN 5-325 MG PO TABS
1 | ORAL_TABLET | Freq: Four times a day (QID) | ORAL | 0 refills | 30.00000 days | Status: CP | PRN
Start: 2019-07-09 — End: 2019-07-09

## 2019-07-09 MED ORDER — HYDROCODONE-ACETAMINOPHEN 5-325 MG PO TABS
1 | ORAL_TABLET | Freq: Four times a day (QID) | ORAL | 0 refills | 30.00000 days | Status: CP | PRN
Start: 2019-07-09 — End: ?

## 2019-07-09 MED ORDER — IPRATROPIUM BROMIDE 0.06 % NA SOLN
NASAL | 6 refills | Status: CP
Start: 2019-07-09 — End: ?

## 2019-07-09 MED ORDER — FLUTICASONE PROPIONATE 50 MCG/ACT NA SUSP
NASAL | 5 refills | 30.00000 days | Status: CP
Start: 2019-07-09 — End: ?

## 2019-07-10 ENCOUNTER — Encounter: Primary: Family Medicine

## 2019-07-11 DIAGNOSIS — I10 Essential (primary) hypertension: Principal | ICD-10-CM

## 2019-07-11 MED ORDER — LOSARTAN POTASSIUM 50 MG PO TABS
50 mg | Freq: Every day | ORAL | 3 refills | Status: CP
Start: 2019-07-11 — End: ?

## 2019-07-21 DIAGNOSIS — N1832 Stage 3b chronic kidney disease: Principal | ICD-10-CM

## 2019-07-21 DIAGNOSIS — N183 Stage 3 chronic kidney disease, unspecified whether stage 3a or 3b CKD: Secondary | ICD-10-CM

## 2019-08-16 DIAGNOSIS — I10 Essential (primary) hypertension: Principal | ICD-10-CM

## 2019-08-16 DIAGNOSIS — I25118 Atherosclerotic heart disease of native coronary artery with other forms of angina pectoris: Secondary | ICD-10-CM

## 2019-08-16 MED ORDER — INSULIN ASPART 100 UNIT/ML SC SOLN
SUBCUTANEOUS | 3 refills | 33.00000 days | Status: CP
Start: 2019-08-16 — End: ?

## 2019-08-20 MED ORDER — HYDROCHLOROTHIAZIDE 12.5 MG PO CAPS
0 refills | Status: CP
Start: 2019-08-20 — End: ?

## 2019-08-20 MED ORDER — ONETOUCH ULTRA VI STRP
ORAL_STRIP | 0 refills | 50.00000 days | Status: CP
Start: 2019-08-20 — End: ?

## 2019-08-20 MED ORDER — CLOPIDOGREL BISULFATE 75 MG PO TABS
ORAL | 0 refills | 90.00000 days | Status: CP
Start: 2019-08-20 — End: ?

## 2019-08-23 ENCOUNTER — Encounter: Attending: "Endocrinology | Primary: Family Medicine

## 2019-09-05 ENCOUNTER — Encounter: Attending: Family Medicine | Primary: Family Medicine

## 2019-09-12 ENCOUNTER — Encounter: Payer: Medicare Other | Attending: Nurse Practitioner | Primary: Family Medicine

## 2019-09-24 ENCOUNTER — Telehealth: Payer: Medicare Other | Attending: Family Medicine | Primary: Family Medicine

## 2019-09-24 DIAGNOSIS — N400 Enlarged prostate without lower urinary tract symptoms: Secondary | ICD-10-CM

## 2019-09-24 DIAGNOSIS — K219 Gastro-esophageal reflux disease without esophagitis: Secondary | ICD-10-CM

## 2019-09-24 DIAGNOSIS — I25810 Atherosclerosis of coronary artery bypass graft(s) without angina pectoris: Secondary | ICD-10-CM

## 2019-09-24 DIAGNOSIS — E1151 Type 2 diabetes mellitus with diabetic peripheral angiopathy without gangrene: Secondary | ICD-10-CM

## 2019-09-24 DIAGNOSIS — M542 Cervicalgia: Principal | ICD-10-CM

## 2019-09-24 MED ORDER — HYDROCODONE-ACETAMINOPHEN 5-325 MG PO TABS
1 | ORAL_TABLET | Freq: Four times a day (QID) | ORAL | 0 refills | 30.00000 days | Status: CP | PRN
Start: 2019-09-24 — End: 2019-09-24

## 2019-09-24 MED ORDER — HYDROCODONE-ACETAMINOPHEN 5-325 MG PO TABS
1 | ORAL_TABLET | Freq: Four times a day (QID) | ORAL | 0 refills | 30.00000 days | Status: CP | PRN
Start: 2019-09-24 — End: ?

## 2019-09-24 MED ORDER — INSULIN ASPART 100 UNIT/ML SC SOLN
SUBCUTANEOUS | 3 refills | 33.00000 days | Status: CP
Start: 2019-09-24 — End: 2019-09-24

## 2019-09-24 MED ORDER — INSULIN ASPART 100 UNIT/ML SC SOLN
SUBCUTANEOUS | 3 refills | 33.00000 days | Status: CP
Start: 2019-09-24 — End: ?

## 2019-09-26 DIAGNOSIS — I10 Essential (primary) hypertension: Secondary | ICD-10-CM

## 2019-09-26 DIAGNOSIS — I25118 Atherosclerotic heart disease of native coronary artery with other forms of angina pectoris: Principal | ICD-10-CM

## 2019-09-27 MED ORDER — CLOPIDOGREL BISULFATE 75 MG PO TABS
ORAL | 0 refills | 90.00000 days | Status: CP
Start: 2019-09-27 — End: ?

## 2019-09-27 MED ORDER — HYDROCHLOROTHIAZIDE 12.5 MG PO CAPS
0 refills | Status: CP
Start: 2019-09-27 — End: ?

## 2019-10-02 ENCOUNTER — Telehealth: Payer: Medicare Other | Primary: Family Medicine

## 2019-10-02 MED ORDER — GABAPENTIN 600 MG PO TABS
600 mg | Freq: Three times a day (TID) | ORAL | 0 refills | 30.00000 days | Status: CP
Start: 2019-10-02 — End: ?

## 2019-10-17 DIAGNOSIS — Z85820 Personal history of malignant melanoma of skin: Principal | ICD-10-CM

## 2019-10-30 ENCOUNTER — Encounter: Payer: Medicare Other | Primary: Family Medicine

## 2019-10-30 ENCOUNTER — Ambulatory Visit: Payer: Medicare Other | Attending: Family Medicine | Primary: Family Medicine

## 2019-10-30 DIAGNOSIS — K219 Gastro-esophageal reflux disease without esophagitis: Secondary | ICD-10-CM

## 2019-10-30 DIAGNOSIS — E1151 Type 2 diabetes mellitus with diabetic peripheral angiopathy without gangrene: Secondary | ICD-10-CM

## 2019-10-30 DIAGNOSIS — I25118 Atherosclerotic heart disease of native coronary artery with other forms of angina pectoris: Secondary | ICD-10-CM

## 2019-10-30 DIAGNOSIS — M542 Cervicalgia: Secondary | ICD-10-CM

## 2019-10-30 DIAGNOSIS — E1142 Type 2 diabetes mellitus with diabetic polyneuropathy: Secondary | ICD-10-CM

## 2019-10-30 DIAGNOSIS — I25810 Atherosclerosis of coronary artery bypass graft(s) without angina pectoris: Secondary | ICD-10-CM

## 2019-10-30 DIAGNOSIS — Z981 Arthrodesis status: Secondary | ICD-10-CM

## 2019-10-30 DIAGNOSIS — E785 Hyperlipidemia, unspecified: Principal | ICD-10-CM

## 2019-10-30 DIAGNOSIS — E538 Deficiency of other specified B group vitamins: Secondary | ICD-10-CM

## 2019-10-30 DIAGNOSIS — I10 Essential (primary) hypertension: Secondary | ICD-10-CM

## 2019-10-30 DIAGNOSIS — N400 Enlarged prostate without lower urinary tract symptoms: Secondary | ICD-10-CM

## 2019-10-30 DIAGNOSIS — Z6827 Body mass index (BMI) 27.0-27.9, adult: Secondary | ICD-10-CM

## 2019-10-30 DIAGNOSIS — Z794 Long term (current) use of insulin: Secondary | ICD-10-CM

## 2019-10-30 MED ORDER — INSULIN ASPART 100 UNIT/ML SC SOLN
SUBCUTANEOUS | 3 refills | 33.00000 days | Status: CP
Start: 2019-10-30 — End: ?

## 2019-10-30 MED ORDER — ATORVASTATIN CALCIUM 40 MG PO TABS
40 mg | Freq: Every day | ORAL | 3 refills | Status: CP
Start: 2019-10-30 — End: ?

## 2019-10-30 MED ORDER — HYDROCODONE-ACETAMINOPHEN 5-325 MG PO TABS
1 | ORAL_TABLET | Freq: Four times a day (QID) | ORAL | 0 refills | 30.00000 days | Status: CP | PRN
Start: 2019-10-30 — End: 2019-10-30

## 2019-10-30 MED ORDER — HYDROCODONE-ACETAMINOPHEN 5-325 MG PO TABS
1 | ORAL_TABLET | Freq: Four times a day (QID) | ORAL | 0 refills | 30.00000 days | Status: CP | PRN
Start: 2019-10-30 — End: ?

## 2019-10-30 MED ORDER — AMLODIPINE BESYLATE 10 MG PO TABS
10 mg | Freq: Every day | ORAL | 3 refills | Status: CP
Start: 2019-10-30 — End: ?

## 2019-10-31 ENCOUNTER — Encounter: Payer: Medicare Other | Attending: "Endocrinology | Primary: Family Medicine

## 2019-11-06 DIAGNOSIS — R2242 Localized swelling, mass and lump, left lower limb: Principal | ICD-10-CM

## 2019-11-07 ENCOUNTER — Ambulatory Visit: Payer: Medicare Other | Attending: Podiatrist | Primary: Family Medicine

## 2019-11-07 DIAGNOSIS — I219 Acute myocardial infarction, unspecified: Secondary | ICD-10-CM

## 2019-11-07 DIAGNOSIS — I10 Essential (primary) hypertension: Principal | ICD-10-CM

## 2019-11-07 DIAGNOSIS — M79605 Pain in left leg: Secondary | ICD-10-CM

## 2019-11-07 DIAGNOSIS — T148XXA Other injury of unspecified body region, initial encounter: Secondary | ICD-10-CM

## 2019-11-07 DIAGNOSIS — M1990 Unspecified osteoarthritis, unspecified site: Secondary | ICD-10-CM

## 2019-11-07 DIAGNOSIS — K219 Gastro-esophageal reflux disease without esophagitis: Secondary | ICD-10-CM

## 2019-11-07 DIAGNOSIS — E1142 Type 2 diabetes mellitus with diabetic polyneuropathy: Secondary | ICD-10-CM

## 2019-11-07 DIAGNOSIS — I2510 Atherosclerotic heart disease of native coronary artery without angina pectoris: Secondary | ICD-10-CM

## 2019-11-07 DIAGNOSIS — Z9189 Other specified personal risk factors, not elsewhere classified: Secondary | ICD-10-CM

## 2019-11-07 DIAGNOSIS — N189 Chronic kidney disease, unspecified: Secondary | ICD-10-CM

## 2019-11-07 DIAGNOSIS — Z9109 Other allergy status, other than to drugs and biological substances: Secondary | ICD-10-CM

## 2019-11-07 DIAGNOSIS — C4492 Squamous cell carcinoma of skin, unspecified: Secondary | ICD-10-CM

## 2019-11-07 DIAGNOSIS — L03119 Cellulitis of unspecified part of limb: Secondary | ICD-10-CM

## 2019-11-07 DIAGNOSIS — I739 Peripheral vascular disease, unspecified: Secondary | ICD-10-CM

## 2019-11-07 DIAGNOSIS — E119 Type 2 diabetes mellitus without complications: Secondary | ICD-10-CM

## 2019-11-07 DIAGNOSIS — C439 Malignant melanoma of skin, unspecified: Secondary | ICD-10-CM

## 2019-11-07 DIAGNOSIS — N400 Enlarged prostate without lower urinary tract symptoms: Secondary | ICD-10-CM

## 2019-11-07 DIAGNOSIS — M542 Cervicalgia: Secondary | ICD-10-CM

## 2019-11-07 DIAGNOSIS — L02619 Cutaneous abscess of unspecified foot: Secondary | ICD-10-CM

## 2019-11-07 DIAGNOSIS — F419 Anxiety disorder, unspecified: Secondary | ICD-10-CM

## 2019-11-07 DIAGNOSIS — G629 Polyneuropathy, unspecified: Secondary | ICD-10-CM

## 2019-11-07 DIAGNOSIS — E785 Hyperlipidemia, unspecified: Secondary | ICD-10-CM

## 2019-11-07 DIAGNOSIS — C4491 Basal cell carcinoma of skin, unspecified: Secondary | ICD-10-CM

## 2019-11-07 DIAGNOSIS — R2242 Localized swelling, mass and lump, left lower limb: Principal | ICD-10-CM

## 2019-11-07 MED ORDER — CIPROFLOXACIN HCL 500 MG PO TABS
500 mg | Freq: Two times a day (BID) | ORAL | 0 refills | 7.00000 days | Status: CP
Start: 2019-11-07 — End: ?

## 2019-11-07 MED ORDER — CLINDAMYCIN HCL 300 MG PO CAPS
300 mg | Freq: Three times a day (TID) | ORAL | 0 refills | 10.00 days | Status: CP
Start: 2019-11-07 — End: ?

## 2019-11-21 ENCOUNTER — Ambulatory Visit: Payer: Medicare Other | Attending: Podiatrist | Primary: Family Medicine

## 2019-11-21 DIAGNOSIS — R2242 Localized swelling, mass and lump, left lower limb: Secondary | ICD-10-CM

## 2019-11-21 DIAGNOSIS — E119 Type 2 diabetes mellitus without complications: Secondary | ICD-10-CM

## 2019-11-21 DIAGNOSIS — E1140 Type 2 diabetes mellitus with diabetic neuropathy, unspecified: Secondary | ICD-10-CM

## 2019-11-21 DIAGNOSIS — T148XXA Other injury of unspecified body region, initial encounter: Secondary | ICD-10-CM

## 2019-11-21 DIAGNOSIS — E1142 Type 2 diabetes mellitus with diabetic polyneuropathy: Principal | ICD-10-CM

## 2019-11-21 DIAGNOSIS — Z794 Long term (current) use of insulin: Secondary | ICD-10-CM

## 2019-11-21 DIAGNOSIS — I739 Peripheral vascular disease, unspecified: Secondary | ICD-10-CM

## 2019-11-21 MED ORDER — FLUOROURACIL 5 % EX CREA: Start: 2019-11-21 — End: ?

## 2019-12-12 ENCOUNTER — Ambulatory Visit: Admit: 2019-12-12 | Discharge: 2019-12-12 | Attending: "Endocrinology

## 2019-12-18 ENCOUNTER — Ambulatory Visit: Admit: 2019-12-18 | Discharge: 2019-12-18 | Attending: "Endocrinology

## 2019-12-18 DIAGNOSIS — Z125 Encounter for screening for malignant neoplasm of prostate: Secondary | ICD-10-CM

## 2019-12-18 DIAGNOSIS — G629 Polyneuropathy, unspecified: Secondary | ICD-10-CM

## 2019-12-18 DIAGNOSIS — Z794 Long term (current) use of insulin: Secondary | ICD-10-CM

## 2019-12-18 DIAGNOSIS — I10 Essential (primary) hypertension: Principal | ICD-10-CM

## 2019-12-18 DIAGNOSIS — C439 Malignant melanoma of skin, unspecified: Secondary | ICD-10-CM

## 2019-12-18 DIAGNOSIS — I219 Acute myocardial infarction, unspecified: Secondary | ICD-10-CM

## 2019-12-18 DIAGNOSIS — N1832 Stage 3b chronic kidney disease: Secondary | ICD-10-CM

## 2019-12-18 DIAGNOSIS — Z9189 Other specified personal risk factors, not elsewhere classified: Secondary | ICD-10-CM

## 2019-12-18 DIAGNOSIS — M542 Cervicalgia: Secondary | ICD-10-CM

## 2019-12-18 DIAGNOSIS — C4491 Basal cell carcinoma of skin, unspecified: Secondary | ICD-10-CM

## 2019-12-18 DIAGNOSIS — I2510 Atherosclerotic heart disease of native coronary artery without angina pectoris: Secondary | ICD-10-CM

## 2019-12-18 DIAGNOSIS — M1990 Unspecified osteoarthritis, unspecified site: Secondary | ICD-10-CM

## 2019-12-18 DIAGNOSIS — K219 Gastro-esophageal reflux disease without esophagitis: Secondary | ICD-10-CM

## 2019-12-18 DIAGNOSIS — N189 Chronic kidney disease, unspecified: Secondary | ICD-10-CM

## 2019-12-18 DIAGNOSIS — E785 Hyperlipidemia, unspecified: Secondary | ICD-10-CM

## 2019-12-18 DIAGNOSIS — E1142 Type 2 diabetes mellitus with diabetic polyneuropathy: Principal | ICD-10-CM

## 2019-12-18 DIAGNOSIS — I739 Peripheral vascular disease, unspecified: Secondary | ICD-10-CM

## 2019-12-18 DIAGNOSIS — E119 Type 2 diabetes mellitus without complications: Secondary | ICD-10-CM

## 2019-12-18 DIAGNOSIS — F419 Anxiety disorder, unspecified: Secondary | ICD-10-CM

## 2019-12-18 DIAGNOSIS — C4492 Squamous cell carcinoma of skin, unspecified: Secondary | ICD-10-CM

## 2019-12-18 DIAGNOSIS — Z9109 Other allergy status, other than to drugs and biological substances: Secondary | ICD-10-CM

## 2019-12-18 DIAGNOSIS — N400 Enlarged prostate without lower urinary tract symptoms: Secondary | ICD-10-CM

## 2019-12-18 DIAGNOSIS — E7849 Other hyperlipidemia: Secondary | ICD-10-CM

## 2019-12-18 MED ORDER — BASAGLAR KWIKPEN 100 UNIT/ML SC SOPN
24 [IU] | Freq: Every evening | SUBCUTANEOUS | 3 refills | 54.00000 days | Status: CP
Start: 2019-12-18 — End: ?

## 2019-12-18 MED ORDER — INSULIN PEN NEEDLE 31G X 6 MM MISC
PEN_INJECTOR | 1 refills | Status: CP
Start: 2019-12-18 — End: ?

## 2020-01-01 DIAGNOSIS — Z951 Presence of aortocoronary bypass graft: Secondary | ICD-10-CM

## 2020-01-01 DIAGNOSIS — N183 Stage 3 chronic kidney disease, unspecified whether stage 3a or 3b CKD: Secondary | ICD-10-CM

## 2020-01-01 DIAGNOSIS — I5189 Other ill-defined heart diseases: Secondary | ICD-10-CM

## 2020-01-01 DIAGNOSIS — I10 Essential (primary) hypertension: Secondary | ICD-10-CM

## 2020-01-01 DIAGNOSIS — I25810 Atherosclerosis of coronary artery bypass graft(s) without angina pectoris: Secondary | ICD-10-CM

## 2020-01-01 DIAGNOSIS — N400 Enlarged prostate without lower urinary tract symptoms: Principal | ICD-10-CM

## 2020-01-01 DIAGNOSIS — M542 Cervicalgia: Secondary | ICD-10-CM

## 2020-01-01 DIAGNOSIS — K219 Gastro-esophageal reflux disease without esophagitis: Secondary | ICD-10-CM

## 2020-01-01 DIAGNOSIS — E1151 Type 2 diabetes mellitus with diabetic peripheral angiopathy without gangrene: Secondary | ICD-10-CM

## 2020-01-01 MED ORDER — HYDROCODONE-ACETAMINOPHEN 5-325 MG PO TABS
1 | ORAL_TABLET | Freq: Four times a day (QID) | ORAL | 0 refills | 30.00000 days | Status: CP | PRN
Start: 2020-01-01 — End: 2020-01-01

## 2020-01-01 MED ORDER — HYDROCODONE-ACETAMINOPHEN 5-325 MG PO TABS
1 | ORAL_TABLET | Freq: Four times a day (QID) | ORAL | 0 refills | 30.00000 days | Status: CP | PRN
Start: 2020-01-01 — End: ?

## 2020-01-02 ENCOUNTER — Ambulatory Visit: Admit: 2020-01-02 | Discharge: 2020-01-02 | Attending: Podiatrist

## 2020-01-08 ENCOUNTER — Ambulatory Visit: Admit: 2020-01-08 | Discharge: 2020-01-08 | Attending: "Endocrinology

## 2020-01-08 DIAGNOSIS — G629 Polyneuropathy, unspecified: Secondary | ICD-10-CM

## 2020-01-08 DIAGNOSIS — Z9189 Other specified personal risk factors, not elsewhere classified: Secondary | ICD-10-CM

## 2020-01-08 DIAGNOSIS — E119 Type 2 diabetes mellitus without complications: Secondary | ICD-10-CM

## 2020-01-08 DIAGNOSIS — N400 Enlarged prostate without lower urinary tract symptoms: Secondary | ICD-10-CM

## 2020-01-08 DIAGNOSIS — I10 Essential (primary) hypertension: Secondary | ICD-10-CM

## 2020-01-08 DIAGNOSIS — I219 Acute myocardial infarction, unspecified: Secondary | ICD-10-CM

## 2020-01-08 DIAGNOSIS — I739 Peripheral vascular disease, unspecified: Secondary | ICD-10-CM

## 2020-01-08 DIAGNOSIS — F419 Anxiety disorder, unspecified: Secondary | ICD-10-CM

## 2020-01-08 DIAGNOSIS — M1990 Unspecified osteoarthritis, unspecified site: Secondary | ICD-10-CM

## 2020-01-08 DIAGNOSIS — K219 Gastro-esophageal reflux disease without esophagitis: Secondary | ICD-10-CM

## 2020-01-08 DIAGNOSIS — Z9109 Other allergy status, other than to drugs and biological substances: Secondary | ICD-10-CM

## 2020-01-08 DIAGNOSIS — N1832 Stage 3b chronic kidney disease: Secondary | ICD-10-CM

## 2020-01-08 DIAGNOSIS — N189 Chronic kidney disease, unspecified: Secondary | ICD-10-CM

## 2020-01-08 DIAGNOSIS — C4491 Basal cell carcinoma of skin, unspecified: Secondary | ICD-10-CM

## 2020-01-08 DIAGNOSIS — E1142 Type 2 diabetes mellitus with diabetic polyneuropathy: Principal | ICD-10-CM

## 2020-01-08 DIAGNOSIS — E785 Hyperlipidemia, unspecified: Secondary | ICD-10-CM

## 2020-01-08 DIAGNOSIS — E7849 Other hyperlipidemia: Secondary | ICD-10-CM

## 2020-01-08 DIAGNOSIS — C439 Malignant melanoma of skin, unspecified: Secondary | ICD-10-CM

## 2020-01-08 DIAGNOSIS — C4492 Squamous cell carcinoma of skin, unspecified: Secondary | ICD-10-CM

## 2020-01-08 DIAGNOSIS — Z794 Long term (current) use of insulin: Secondary | ICD-10-CM

## 2020-01-08 DIAGNOSIS — I2510 Atherosclerotic heart disease of native coronary artery without angina pectoris: Secondary | ICD-10-CM

## 2020-01-08 DIAGNOSIS — M542 Cervicalgia: Secondary | ICD-10-CM

## 2020-01-10 ENCOUNTER — Ambulatory Visit: Admit: 2020-01-10 | Discharge: 2020-01-10 | Attending: "Endocrinology

## 2020-01-15 DIAGNOSIS — I10 Essential (primary) hypertension: Principal | ICD-10-CM

## 2020-01-15 DIAGNOSIS — I25118 Atherosclerotic heart disease of native coronary artery with other forms of angina pectoris: Principal | ICD-10-CM

## 2020-01-15 MED ORDER — CLOPIDOGREL BISULFATE 75 MG PO TABS
ORAL | 0 refills | 90.00000 days | Status: CP
Start: 2020-01-15 — End: ?

## 2020-01-15 MED ORDER — METOPROLOL SUCCINATE ER 50 MG PO TB24
0 refills | Status: CP
Start: 2020-01-15 — End: ?

## 2020-02-11 DIAGNOSIS — I10 Essential (primary) hypertension: Principal | ICD-10-CM

## 2020-02-11 MED ORDER — LOSARTAN POTASSIUM 50 MG PO TABS
50 mg | Freq: Every day | ORAL | 3 refills | 90.00 days | Status: CP
Start: 2020-02-11 — End: ?

## 2020-03-04 DIAGNOSIS — M542 Cervicalgia: Principal | ICD-10-CM

## 2020-03-04 MED ORDER — ONETOUCH ULTRA VI STRP
ORAL_STRIP | 0 refills | 50.00000 days | Status: CP
Start: 2020-03-04 — End: ?

## 2020-03-04 MED ORDER — IPRATROPIUM BROMIDE 0.06 % NA SOLN
NASAL | 0 refills | Status: CP
Start: 2020-03-04 — End: ?

## 2020-04-08 DIAGNOSIS — L03119 Cellulitis of unspecified part of limb: Principal | ICD-10-CM

## 2020-04-08 MED ORDER — CEPHALEXIN 500 MG PO CAPS
500 mg | Freq: Two times a day (BID) | ORAL | 0 refills | 7.00000 days | Status: CP
Start: 2020-04-08 — End: ?

## 2020-04-14 DIAGNOSIS — F419 Anxiety disorder, unspecified: Secondary | ICD-10-CM

## 2020-04-14 DIAGNOSIS — G629 Polyneuropathy, unspecified: Secondary | ICD-10-CM

## 2020-04-14 DIAGNOSIS — M542 Cervicalgia: Secondary | ICD-10-CM

## 2020-04-14 DIAGNOSIS — N400 Enlarged prostate without lower urinary tract symptoms: Secondary | ICD-10-CM

## 2020-04-14 DIAGNOSIS — Z9189 Other specified personal risk factors, not elsewhere classified: Secondary | ICD-10-CM

## 2020-04-14 DIAGNOSIS — N189 Chronic kidney disease, unspecified: Secondary | ICD-10-CM

## 2020-04-14 DIAGNOSIS — C439 Malignant melanoma of skin, unspecified: Secondary | ICD-10-CM

## 2020-04-14 DIAGNOSIS — M1990 Unspecified osteoarthritis, unspecified site: Secondary | ICD-10-CM

## 2020-04-14 DIAGNOSIS — I2510 Atherosclerotic heart disease of native coronary artery without angina pectoris: Secondary | ICD-10-CM

## 2020-04-14 DIAGNOSIS — M79674 Pain in right toe(s): Principal | ICD-10-CM

## 2020-04-14 DIAGNOSIS — I219 Acute myocardial infarction, unspecified: Secondary | ICD-10-CM

## 2020-04-14 DIAGNOSIS — E119 Type 2 diabetes mellitus without complications: Secondary | ICD-10-CM

## 2020-04-14 DIAGNOSIS — K219 Gastro-esophageal reflux disease without esophagitis: Secondary | ICD-10-CM

## 2020-04-14 DIAGNOSIS — I10 Essential (primary) hypertension: Principal | ICD-10-CM

## 2020-04-14 DIAGNOSIS — C4491 Basal cell carcinoma of skin, unspecified: Secondary | ICD-10-CM

## 2020-04-14 DIAGNOSIS — C4492 Squamous cell carcinoma of skin, unspecified: Secondary | ICD-10-CM

## 2020-04-14 DIAGNOSIS — I739 Peripheral vascular disease, unspecified: Secondary | ICD-10-CM

## 2020-04-14 DIAGNOSIS — E785 Hyperlipidemia, unspecified: Secondary | ICD-10-CM

## 2020-04-14 DIAGNOSIS — Z9109 Other allergy status, other than to drugs and biological substances: Secondary | ICD-10-CM

## 2020-04-15 DIAGNOSIS — G629 Polyneuropathy, unspecified: Secondary | ICD-10-CM

## 2020-04-15 DIAGNOSIS — Z9109 Other allergy status, other than to drugs and biological substances: Secondary | ICD-10-CM

## 2020-04-15 DIAGNOSIS — I219 Acute myocardial infarction, unspecified: Secondary | ICD-10-CM

## 2020-04-15 DIAGNOSIS — F419 Anxiety disorder, unspecified: Secondary | ICD-10-CM

## 2020-04-15 DIAGNOSIS — I2510 Atherosclerotic heart disease of native coronary artery without angina pectoris: Secondary | ICD-10-CM

## 2020-04-15 DIAGNOSIS — M1990 Unspecified osteoarthritis, unspecified site: Secondary | ICD-10-CM

## 2020-04-15 DIAGNOSIS — I10 Essential (primary) hypertension: Principal | ICD-10-CM

## 2020-04-15 DIAGNOSIS — C439 Malignant melanoma of skin, unspecified: Secondary | ICD-10-CM

## 2020-04-15 DIAGNOSIS — C4491 Basal cell carcinoma of skin, unspecified: Secondary | ICD-10-CM

## 2020-04-15 DIAGNOSIS — K219 Gastro-esophageal reflux disease without esophagitis: Secondary | ICD-10-CM

## 2020-04-15 DIAGNOSIS — E785 Hyperlipidemia, unspecified: Secondary | ICD-10-CM

## 2020-04-15 DIAGNOSIS — N189 Chronic kidney disease, unspecified: Secondary | ICD-10-CM

## 2020-04-15 DIAGNOSIS — M542 Cervicalgia: Secondary | ICD-10-CM

## 2020-04-15 DIAGNOSIS — E119 Type 2 diabetes mellitus without complications: Secondary | ICD-10-CM

## 2020-04-15 DIAGNOSIS — I739 Peripheral vascular disease, unspecified: Secondary | ICD-10-CM

## 2020-04-15 DIAGNOSIS — N1832 Stage 3b chronic kidney disease: Secondary | ICD-10-CM

## 2020-04-15 DIAGNOSIS — N400 Enlarged prostate without lower urinary tract symptoms: Secondary | ICD-10-CM

## 2020-04-15 DIAGNOSIS — Z9189 Other specified personal risk factors, not elsewhere classified: Secondary | ICD-10-CM

## 2020-04-15 DIAGNOSIS — E7849 Other hyperlipidemia: Secondary | ICD-10-CM

## 2020-04-15 DIAGNOSIS — C4492 Squamous cell carcinoma of skin, unspecified: Secondary | ICD-10-CM

## 2020-04-22 ENCOUNTER — Encounter: Payer: Medicare Other | Attending: Family Medicine | Primary: Family Medicine

## 2020-04-24 DIAGNOSIS — J302 Other seasonal allergic rhinitis: Secondary | ICD-10-CM

## 2020-04-24 DIAGNOSIS — I25118 Atherosclerotic heart disease of native coronary artery with other forms of angina pectoris: Secondary | ICD-10-CM

## 2020-04-24 DIAGNOSIS — I10 Essential (primary) hypertension: Secondary | ICD-10-CM

## 2020-04-24 MED ORDER — ATORVASTATIN CALCIUM 40 MG PO TABS
40 mg | Freq: Every day | ORAL | 3 refills
Start: 2020-04-24 — End: ?

## 2020-04-24 MED ORDER — FLUTICASONE PROPIONATE 50 MCG/ACT NA SUSP
5 refills | Status: CN
Start: 2020-04-24 — End: ?

## 2020-04-24 MED ORDER — TAMSULOSIN HCL 0.4 MG PO CAPS
.4 mg | Freq: Every day | ORAL | 3 refills | Status: CN
Start: 2020-04-24 — End: ?

## 2020-04-24 MED ORDER — AMLODIPINE BESYLATE 10 MG PO TABS
10 mg | Freq: Every day | ORAL | 0 refills | Status: CP
Start: 2020-04-24 — End: ?

## 2020-04-24 MED ORDER — TAMSULOSIN HCL 0.4 MG PO CAPS
.4 mg | Freq: Every day | ORAL | 0 refills | 30.00000 days | Status: CP
Start: 2020-04-24 — End: ?

## 2020-04-24 MED ORDER — LOSARTAN POTASSIUM 50 MG PO TABS
50 mg | Freq: Every day | ORAL | 3 refills
Start: 2020-04-24 — End: ?

## 2020-04-24 MED ORDER — ONETOUCH ULTRA VI STRP
ORAL_STRIP | 0 refills | Status: CN
Start: 2020-04-24 — End: ?

## 2020-04-24 MED ORDER — ATORVASTATIN CALCIUM 40 MG PO TABS
40 mg | Freq: Every day | ORAL | 3 refills | Status: CN
Start: 2020-04-24 — End: ?

## 2020-04-24 MED ORDER — HYDROCHLOROTHIAZIDE 12.5 MG PO CAPS
12.5 mg | Freq: Every day | ORAL | 0 refills | Status: CP
Start: 2020-04-24 — End: ?

## 2020-04-24 MED ORDER — INSULIN ASPART 100 UNIT/ML SC SOLN
3 refills
Start: 2020-04-24 — End: ?

## 2020-04-24 MED ORDER — METOPROLOL SUCCINATE ER 50 MG PO TB24
50 mg | Freq: Two times a day (BID) | ORAL | 0 refills | Status: CP
Start: 2020-04-24 — End: ?

## 2020-04-24 MED ORDER — ONETOUCH ULTRA VI STRP
ORAL_STRIP | 0 refills
Start: 2020-04-24 — End: ?

## 2020-04-24 MED ORDER — INSULIN ASPART 100 UNIT/ML SC SOLN
3 refills | Status: CN
Start: 2020-04-24 — End: ?

## 2020-04-24 MED ORDER — FLUTICASONE PROPIONATE 50 MCG/ACT NA SUSP
5 refills
Start: 2020-04-24 — End: ?

## 2020-04-24 MED ORDER — CLOPIDOGREL BISULFATE 75 MG PO TABS
75 mg | Freq: Every day | ORAL | 0 refills | Status: CN
Start: 2020-04-24 — End: ?

## 2020-04-24 MED ORDER — CLOPIDOGREL BISULFATE 75 MG PO TABS
75 mg | Freq: Every day | ORAL | 0 refills
Start: 2020-04-24 — End: ?

## 2020-04-30 ENCOUNTER — Ambulatory Visit: Payer: Medicare Other | Attending: Family Medicine | Primary: Family Medicine

## 2020-04-30 DIAGNOSIS — I739 Peripheral vascular disease, unspecified: Secondary | ICD-10-CM

## 2020-04-30 DIAGNOSIS — E119 Type 2 diabetes mellitus without complications: Secondary | ICD-10-CM

## 2020-04-30 DIAGNOSIS — G629 Polyneuropathy, unspecified: Secondary | ICD-10-CM

## 2020-04-30 DIAGNOSIS — C4492 Squamous cell carcinoma of skin, unspecified: Secondary | ICD-10-CM

## 2020-04-30 DIAGNOSIS — F419 Anxiety disorder, unspecified: Secondary | ICD-10-CM

## 2020-04-30 DIAGNOSIS — M542 Cervicalgia: Secondary | ICD-10-CM

## 2020-04-30 DIAGNOSIS — Z9189 Other specified personal risk factors, not elsewhere classified: Secondary | ICD-10-CM

## 2020-04-30 DIAGNOSIS — I10 Essential (primary) hypertension: Principal | ICD-10-CM

## 2020-04-30 DIAGNOSIS — Z6827 Body mass index (BMI) 27.0-27.9, adult: Principal | ICD-10-CM

## 2020-04-30 DIAGNOSIS — E785 Hyperlipidemia, unspecified: Secondary | ICD-10-CM

## 2020-04-30 DIAGNOSIS — I2510 Atherosclerotic heart disease of native coronary artery without angina pectoris: Secondary | ICD-10-CM

## 2020-04-30 DIAGNOSIS — Z9109 Other allergy status, other than to drugs and biological substances: Secondary | ICD-10-CM

## 2020-04-30 DIAGNOSIS — N400 Enlarged prostate without lower urinary tract symptoms: Secondary | ICD-10-CM

## 2020-04-30 DIAGNOSIS — K219 Gastro-esophageal reflux disease without esophagitis: Secondary | ICD-10-CM

## 2020-04-30 DIAGNOSIS — I219 Acute myocardial infarction, unspecified: Secondary | ICD-10-CM

## 2020-04-30 DIAGNOSIS — N189 Chronic kidney disease, unspecified: Secondary | ICD-10-CM

## 2020-04-30 DIAGNOSIS — M1990 Unspecified osteoarthritis, unspecified site: Secondary | ICD-10-CM

## 2020-04-30 DIAGNOSIS — C439 Malignant melanoma of skin, unspecified: Secondary | ICD-10-CM

## 2020-04-30 DIAGNOSIS — C4491 Basal cell carcinoma of skin, unspecified: Secondary | ICD-10-CM

## 2020-04-30 MED ORDER — LOSARTAN POTASSIUM 50 MG PO TABS
50 mg | Freq: Every day | ORAL | 3 refills | Status: CP
Start: 2020-04-30 — End: 2020-04-30

## 2020-04-30 MED ORDER — HYDROCODONE-ACETAMINOPHEN 5-325 MG PO TABS
1 | ORAL_TABLET | Freq: Four times a day (QID) | ORAL | 0 refills | 30.00000 days | Status: CP | PRN
Start: 2020-04-30 — End: ?

## 2020-04-30 MED ORDER — LOSARTAN POTASSIUM 50 MG PO TABS
3 refills | Status: CP
Start: 2020-04-30 — End: ?

## 2020-04-30 MED ORDER — HYDROCODONE-ACETAMINOPHEN 5-325 MG PO TABS
1 | ORAL_TABLET | Freq: Four times a day (QID) | ORAL | 0 refills | 30.00000 days | Status: CP | PRN
Start: 2020-04-30 — End: 2020-04-30

## 2020-05-05 DIAGNOSIS — I25118 Atherosclerotic heart disease of native coronary artery with other forms of angina pectoris: Principal | ICD-10-CM

## 2020-05-05 MED ORDER — ONETOUCH ULTRA VI STRP
ORAL_STRIP | 0 refills | 50.00000 days | Status: CP
Start: 2020-05-05 — End: ?

## 2020-05-05 MED ORDER — CLOPIDOGREL BISULFATE 75 MG PO TABS
ORAL | 0 refills | 90.00000 days | Status: CP
Start: 2020-05-05 — End: ?

## 2020-06-11 ENCOUNTER — Ambulatory Visit: Payer: Medicare Other | Attending: Podiatrist | Primary: Family Medicine

## 2020-06-11 DIAGNOSIS — E119 Type 2 diabetes mellitus without complications: Secondary | ICD-10-CM

## 2020-06-11 DIAGNOSIS — C4492 Squamous cell carcinoma of skin, unspecified: Secondary | ICD-10-CM

## 2020-06-11 DIAGNOSIS — L84 Corns and callosities: Secondary | ICD-10-CM

## 2020-06-11 DIAGNOSIS — I219 Acute myocardial infarction, unspecified: Secondary | ICD-10-CM

## 2020-06-11 DIAGNOSIS — I2510 Atherosclerotic heart disease of native coronary artery without angina pectoris: Secondary | ICD-10-CM

## 2020-06-11 DIAGNOSIS — E785 Hyperlipidemia, unspecified: Secondary | ICD-10-CM

## 2020-06-11 DIAGNOSIS — I739 Peripheral vascular disease, unspecified: Secondary | ICD-10-CM

## 2020-06-11 DIAGNOSIS — M1990 Unspecified osteoarthritis, unspecified site: Secondary | ICD-10-CM

## 2020-06-11 DIAGNOSIS — Z9109 Other allergy status, other than to drugs and biological substances: Secondary | ICD-10-CM

## 2020-06-11 DIAGNOSIS — C4491 Basal cell carcinoma of skin, unspecified: Secondary | ICD-10-CM

## 2020-06-11 DIAGNOSIS — N400 Enlarged prostate without lower urinary tract symptoms: Secondary | ICD-10-CM

## 2020-06-11 DIAGNOSIS — N189 Chronic kidney disease, unspecified: Secondary | ICD-10-CM

## 2020-06-11 DIAGNOSIS — G629 Polyneuropathy, unspecified: Secondary | ICD-10-CM

## 2020-06-11 DIAGNOSIS — F419 Anxiety disorder, unspecified: Secondary | ICD-10-CM

## 2020-06-11 DIAGNOSIS — M542 Cervicalgia: Secondary | ICD-10-CM

## 2020-06-11 DIAGNOSIS — K219 Gastro-esophageal reflux disease without esophagitis: Secondary | ICD-10-CM

## 2020-06-11 DIAGNOSIS — C439 Malignant melanoma of skin, unspecified: Secondary | ICD-10-CM

## 2020-06-11 DIAGNOSIS — Z9189 Other specified personal risk factors, not elsewhere classified: Secondary | ICD-10-CM

## 2020-06-11 DIAGNOSIS — E1142 Type 2 diabetes mellitus with diabetic polyneuropathy: Principal | ICD-10-CM

## 2020-06-11 DIAGNOSIS — I10 Essential (primary) hypertension: Principal | ICD-10-CM

## 2020-06-23 ENCOUNTER — Ambulatory Visit: Payer: Medicare Other | Attending: "Endocrinology | Primary: Family Medicine

## 2020-06-23 ENCOUNTER — Ambulatory Visit: Payer: Medicare Other | Attending: Family Medicine | Primary: Family Medicine

## 2020-06-23 DIAGNOSIS — M542 Cervicalgia: Secondary | ICD-10-CM

## 2020-06-23 DIAGNOSIS — I739 Peripheral vascular disease, unspecified: Secondary | ICD-10-CM

## 2020-06-23 DIAGNOSIS — E1151 Type 2 diabetes mellitus with diabetic peripheral angiopathy without gangrene: Secondary | ICD-10-CM

## 2020-06-23 DIAGNOSIS — C4492 Squamous cell carcinoma of skin, unspecified: Secondary | ICD-10-CM

## 2020-06-23 DIAGNOSIS — I10 Essential (primary) hypertension: Secondary | ICD-10-CM

## 2020-06-23 DIAGNOSIS — M1990 Unspecified osteoarthritis, unspecified site: Secondary | ICD-10-CM

## 2020-06-23 DIAGNOSIS — N1832 Stage 3b chronic kidney disease (CMS-HCC: 138): Secondary | ICD-10-CM

## 2020-06-23 DIAGNOSIS — E785 Hyperlipidemia, unspecified: Secondary | ICD-10-CM

## 2020-06-23 DIAGNOSIS — G629 Polyneuropathy, unspecified: Secondary | ICD-10-CM

## 2020-06-23 DIAGNOSIS — E7849 Other hyperlipidemia: Secondary | ICD-10-CM

## 2020-06-23 DIAGNOSIS — Z6826 Body mass index (BMI) 26.0-26.9, adult: Principal | ICD-10-CM

## 2020-06-23 DIAGNOSIS — I25810 Atherosclerosis of coronary artery bypass graft(s) without angina pectoris: Secondary | ICD-10-CM

## 2020-06-23 DIAGNOSIS — I219 Acute myocardial infarction, unspecified: Secondary | ICD-10-CM

## 2020-06-23 DIAGNOSIS — N400 Enlarged prostate without lower urinary tract symptoms: Secondary | ICD-10-CM

## 2020-06-23 DIAGNOSIS — I5189 Other ill-defined heart diseases: Secondary | ICD-10-CM

## 2020-06-23 DIAGNOSIS — K219 Gastro-esophageal reflux disease without esophagitis: Secondary | ICD-10-CM

## 2020-06-23 DIAGNOSIS — E119 Type 2 diabetes mellitus without complications: Secondary | ICD-10-CM

## 2020-06-23 DIAGNOSIS — N189 Chronic kidney disease, unspecified: Secondary | ICD-10-CM

## 2020-06-23 DIAGNOSIS — N183 Stage 3 chronic kidney disease, unspecified whether stage 3a or 3b CKD (CMS-HCC: 138): Secondary | ICD-10-CM

## 2020-06-23 DIAGNOSIS — Z9189 Other specified personal risk factors, not elsewhere classified: Secondary | ICD-10-CM

## 2020-06-23 DIAGNOSIS — Z9109 Other allergy status, other than to drugs and biological substances: Secondary | ICD-10-CM

## 2020-06-23 DIAGNOSIS — C4491 Basal cell carcinoma of skin, unspecified: Secondary | ICD-10-CM

## 2020-06-23 DIAGNOSIS — F419 Anxiety disorder, unspecified: Secondary | ICD-10-CM

## 2020-06-23 DIAGNOSIS — E1142 Type 2 diabetes mellitus with diabetic polyneuropathy: Principal | ICD-10-CM

## 2020-06-23 DIAGNOSIS — Z794 Long term (current) use of insulin: Secondary | ICD-10-CM

## 2020-06-23 DIAGNOSIS — C439 Malignant melanoma of skin, unspecified: Secondary | ICD-10-CM

## 2020-06-23 DIAGNOSIS — I2510 Atherosclerotic heart disease of native coronary artery without angina pectoris: Secondary | ICD-10-CM

## 2020-06-23 MED ORDER — HYDROCODONE-ACETAMINOPHEN 5-325 MG PO TABS
1 | ORAL_TABLET | Freq: Four times a day (QID) | ORAL | 0 refills | 30.00000 days | Status: CP | PRN
Start: 2020-06-23 — End: 2020-06-23

## 2020-06-23 MED ORDER — HYDROCODONE-ACETAMINOPHEN 5-325 MG PO TABS
1 | ORAL_TABLET | Freq: Four times a day (QID) | ORAL | 0 refills | 30.00000 days | Status: CP | PRN
Start: 2020-06-23 — End: ?

## 2020-06-28 DIAGNOSIS — I10 Essential (primary) hypertension: Principal | ICD-10-CM

## 2020-06-28 DIAGNOSIS — L03119 Cellulitis of unspecified part of limb: Principal | ICD-10-CM

## 2020-06-28 MED ORDER — METOPROLOL SUCCINATE ER 50 MG PO TB24
0 refills | Status: CP
Start: 2020-06-28 — End: ?

## 2020-06-28 MED ORDER — CEPHALEXIN 500 MG PO CAPS
0 refills
Start: 2020-06-28 — End: ?

## 2020-06-30 MED ORDER — AMLODIPINE BESYLATE 10 MG PO TABS
0 refills | Status: CP
Start: 2020-06-30 — End: ?

## 2020-06-30 MED ORDER — HYDROCHLOROTHIAZIDE 12.5 MG PO CAPS
0 refills | Status: CP
Start: 2020-06-30 — End: ?

## 2020-06-30 MED ORDER — METOPROLOL SUCCINATE ER 50 MG PO TB24
0 refills | Status: CP
Start: 2020-06-30 — End: ?

## 2020-08-05 DIAGNOSIS — I10 Essential (primary) hypertension: Secondary | ICD-10-CM

## 2020-08-05 DIAGNOSIS — M542 Cervicalgia: Principal | ICD-10-CM

## 2020-08-05 DIAGNOSIS — I25118 Atherosclerotic heart disease of native coronary artery with other forms of angina pectoris: Secondary | ICD-10-CM

## 2020-08-05 MED ORDER — GABAPENTIN 600 MG PO TABS
600 mg | Freq: Three times a day (TID) | ORAL | 0 refills | 30.00000 days | Status: CP
Start: 2020-08-05 — End: ?

## 2020-08-05 MED ORDER — CLOPIDOGREL BISULFATE 75 MG PO TABS
75 mg | Freq: Every day | ORAL | 0 refills | 90.00000 days | Status: CP
Start: 2020-08-05 — End: ?

## 2020-08-05 MED ORDER — METOPROLOL SUCCINATE ER 50 MG PO TB24
50 mg | Freq: Two times a day (BID) | ORAL | 0 refills | 90.00000 days | Status: CP
Start: 2020-08-05 — End: ?

## 2020-08-05 MED ORDER — AMLODIPINE BESYLATE 10 MG PO TABS
10 mg | Freq: Every day | ORAL | 2 refills | Status: CP
Start: 2020-08-05 — End: ?

## 2020-08-05 MED ORDER — IPRATROPIUM BROMIDE 0.06 % NA SOLN
NASAL | 0 refills | Status: CP
Start: 2020-08-05 — End: ?

## 2020-08-05 MED ORDER — TAMSULOSIN HCL 0.4 MG PO CAPS
.4 mg | Freq: Every day | ORAL | 0 refills | 30.00 days | Status: CP
Start: 2020-08-05 — End: ?

## 2020-08-05 MED ORDER — ONETOUCH ULTRA VI STRP
ORAL_STRIP | 0 refills | 50.00000 days | Status: CP
Start: 2020-08-05 — End: ?

## 2020-08-05 MED ORDER — HYDROCHLOROTHIAZIDE 12.5 MG PO CAPS
12.5 mg | Freq: Every day | ORAL | 2 refills | Status: CP
Start: 2020-08-05 — End: ?

## 2020-08-13 DIAGNOSIS — M542 Cervicalgia: Principal | ICD-10-CM

## 2020-08-13 MED ORDER — HYDROCODONE-ACETAMINOPHEN 5-325 MG PO TABS
1 | ORAL_TABLET | Freq: Four times a day (QID) | ORAL | 0 refills | PRN
Start: 2020-08-13 — End: ?

## 2020-08-18 MED ORDER — HYDROCODONE-ACETAMINOPHEN 5-325 MG PO TABS
1 | ORAL_TABLET | Freq: Four times a day (QID) | ORAL | 0 refills | 30.00000 days | Status: CP | PRN
Start: 2020-08-18 — End: ?

## 2020-09-18 MED ORDER — ACCU-CHEK GUIDE VI STRP
ORAL_STRIP | ORAL | 11 refills | 50.00000 days | Status: CP
Start: 2020-09-18 — End: ?

## 2020-09-22 ENCOUNTER — Encounter: Payer: Medicare Other | Attending: Family Medicine | Primary: Family Medicine

## 2020-10-01 DIAGNOSIS — N183 Stage 3 chronic kidney disease, unspecified whether stage 3a or 3b CKD (CMS-HCC: 138): Principal | ICD-10-CM

## 2020-10-28 ENCOUNTER — Ambulatory Visit: Payer: Medicare Other | Attending: Family Medicine | Primary: Family Medicine

## 2020-10-28 DIAGNOSIS — M542 Cervicalgia: Secondary | ICD-10-CM

## 2020-10-28 DIAGNOSIS — C4492 Squamous cell carcinoma of skin, unspecified: Secondary | ICD-10-CM

## 2020-10-28 DIAGNOSIS — J302 Other seasonal allergic rhinitis: Secondary | ICD-10-CM

## 2020-10-28 DIAGNOSIS — F419 Anxiety disorder, unspecified: Secondary | ICD-10-CM

## 2020-10-28 DIAGNOSIS — C4491 Basal cell carcinoma of skin, unspecified: Secondary | ICD-10-CM

## 2020-10-28 DIAGNOSIS — N183 Stage 3 chronic kidney disease, unspecified whether stage 3a or 3b CKD (CMS-HCC: 138): Secondary | ICD-10-CM

## 2020-10-28 DIAGNOSIS — G629 Polyneuropathy, unspecified: Secondary | ICD-10-CM

## 2020-10-28 DIAGNOSIS — E1151 Type 2 diabetes mellitus with diabetic peripheral angiopathy without gangrene: Secondary | ICD-10-CM

## 2020-10-28 DIAGNOSIS — C439 Malignant melanoma of skin, unspecified: Secondary | ICD-10-CM

## 2020-10-28 DIAGNOSIS — N400 Enlarged prostate without lower urinary tract symptoms: Secondary | ICD-10-CM

## 2020-10-28 DIAGNOSIS — K219 Gastro-esophageal reflux disease without esophagitis: Secondary | ICD-10-CM

## 2020-10-28 DIAGNOSIS — I25810 Atherosclerosis of coronary artery bypass graft(s) without angina pectoris: Secondary | ICD-10-CM

## 2020-10-28 DIAGNOSIS — I219 Acute myocardial infarction, unspecified: Secondary | ICD-10-CM

## 2020-10-28 DIAGNOSIS — I25118 Atherosclerotic heart disease of native coronary artery with other forms of angina pectoris: Secondary | ICD-10-CM

## 2020-10-28 DIAGNOSIS — I10 Essential (primary) hypertension: Principal | ICD-10-CM

## 2020-10-28 DIAGNOSIS — Z9189 Other specified personal risk factors, not elsewhere classified: Secondary | ICD-10-CM

## 2020-10-28 DIAGNOSIS — Z6827 Body mass index (BMI) 27.0-27.9, adult: Secondary | ICD-10-CM

## 2020-10-28 DIAGNOSIS — Z9109 Other allergy status, other than to drugs and biological substances: Secondary | ICD-10-CM

## 2020-10-28 DIAGNOSIS — N189 Chronic kidney disease, unspecified: Secondary | ICD-10-CM

## 2020-10-28 DIAGNOSIS — I2510 Atherosclerotic heart disease of native coronary artery without angina pectoris: Secondary | ICD-10-CM

## 2020-10-28 DIAGNOSIS — M1990 Unspecified osteoarthritis, unspecified site: Secondary | ICD-10-CM

## 2020-10-28 DIAGNOSIS — E785 Hyperlipidemia, unspecified: Secondary | ICD-10-CM

## 2020-10-28 DIAGNOSIS — I5189 Other ill-defined heart diseases: Secondary | ICD-10-CM

## 2020-10-28 DIAGNOSIS — I739 Peripheral vascular disease, unspecified: Secondary | ICD-10-CM

## 2020-10-28 DIAGNOSIS — E119 Type 2 diabetes mellitus without complications: Secondary | ICD-10-CM

## 2020-10-28 MED ORDER — METOPROLOL SUCCINATE ER 50 MG PO TB24
50 mg | Freq: Two times a day (BID) | ORAL | 3 refills | Status: CP
Start: 2020-10-28 — End: ?

## 2020-10-28 MED ORDER — HYDROCODONE-ACETAMINOPHEN 5-325 MG PO TABS
1 | ORAL_TABLET | Freq: Four times a day (QID) | ORAL | 0 refills | 30.00000 days | Status: CP | PRN
Start: 2020-10-28 — End: ?

## 2020-10-28 MED ORDER — AMOXICILLIN 875 MG PO TABS
875 mg | Freq: Two times a day (BID) | ORAL | 0 refills | Status: CP
Start: 2020-10-28 — End: ?

## 2020-10-28 MED ORDER — INSULIN ASPART 100 UNIT/ML SC SOLN
SUBCUTANEOUS | 3 refills | 33.00000 days | Status: CP
Start: 2020-10-28 — End: ?

## 2020-10-28 MED ORDER — HYDROCODONE-ACETAMINOPHEN 5-325 MG PO TABS
1 | ORAL_TABLET | Freq: Four times a day (QID) | ORAL | 0 refills | 30.00000 days | Status: CP | PRN
Start: 2020-10-28 — End: 2020-10-28

## 2020-10-28 MED ORDER — GABAPENTIN 600 MG PO TABS
600 mg | Freq: Three times a day (TID) | ORAL | 3 refills | Status: CP
Start: 2020-10-28 — End: ?

## 2020-10-28 MED ORDER — LOSARTAN POTASSIUM 50 MG PO TABS
3 refills | Status: CP
Start: 2020-10-28 — End: ?

## 2020-10-28 MED ORDER — AMLODIPINE BESYLATE 10 MG PO TABS
10 mg | Freq: Every day | ORAL | 3 refills | Status: CP
Start: 2020-10-28 — End: ?

## 2020-10-28 MED ORDER — TAMSULOSIN HCL 0.4 MG PO CAPS
.4 mg | Freq: Every day | ORAL | 3 refills | Status: CP
Start: 2020-10-28 — End: ?

## 2020-10-28 MED ORDER — ONETOUCH ULTRA VI STRP
ORAL_STRIP | 0 refills | 50.00000 days | Status: CP
Start: 2020-10-28 — End: ?

## 2020-10-28 MED ORDER — ATORVASTATIN CALCIUM 40 MG PO TABS
40 mg | Freq: Every day | ORAL | 3 refills | 30.00 days | Status: CP
Start: 2020-10-28 — End: ?

## 2020-10-28 MED ORDER — HYDROCHLOROTHIAZIDE 12.5 MG PO CAPS
12.5 mg | Freq: Every day | ORAL | 2 refills | Status: CN
Start: 2020-10-28 — End: ?

## 2020-10-28 MED ORDER — CLOPIDOGREL BISULFATE 75 MG PO TABS
75 mg | Freq: Every day | ORAL | 3 refills | 90.00000 days | Status: CP
Start: 2020-10-28 — End: ?

## 2020-10-28 MED ORDER — FLUTICASONE PROPIONATE 50 MCG/ACT NA SUSP
3 refills | Status: CP
Start: 2020-10-28 — End: ?

## 2020-10-29 ENCOUNTER — Ambulatory Visit: Payer: Medicare Other | Attending: "Endocrinology | Primary: Family Medicine

## 2020-10-29 DIAGNOSIS — I10 Essential (primary) hypertension: Secondary | ICD-10-CM

## 2020-10-29 DIAGNOSIS — E7849 Other hyperlipidemia: Secondary | ICD-10-CM

## 2020-10-29 DIAGNOSIS — N1832 Stage 3b chronic kidney disease (CMS-HCC: 138): Secondary | ICD-10-CM

## 2020-11-12 ENCOUNTER — Encounter: Payer: Medicare Other | Attending: Podiatrist | Primary: Family Medicine

## 2020-11-12 DIAGNOSIS — E1151 Type 2 diabetes mellitus with diabetic peripheral angiopathy without gangrene: Principal | ICD-10-CM

## 2020-12-17 MED ORDER — INSULIN ASPART 100 UNIT/ML IJ SOLN
SUBCUTANEOUS | 3 refills | 33.00000 days | Status: CP
Start: 2020-12-17 — End: ?

## 2020-12-25 ENCOUNTER — Ambulatory Visit
Payer: Medicare Other | Attending: Student in an Organized Health Care Education/Training Program | Primary: Family Medicine

## 2020-12-25 DIAGNOSIS — E1121 Type 2 diabetes mellitus with diabetic nephropathy: Secondary | ICD-10-CM

## 2020-12-25 DIAGNOSIS — D631 Anemia in chronic kidney disease: Secondary | ICD-10-CM

## 2020-12-25 DIAGNOSIS — I129 Hypertensive chronic kidney disease with stage 1 through stage 4 chronic kidney disease, or unspecified chronic kidney disease: Secondary | ICD-10-CM

## 2020-12-25 DIAGNOSIS — N1831 Stage 3a chronic kidney disease (CMS-HCC: 138): Principal | ICD-10-CM

## 2020-12-25 DIAGNOSIS — N183 Benign hypertension with CKD (chronic kidney disease) stage III (CMS-HCC: 138): Secondary | ICD-10-CM

## 2020-12-29 DIAGNOSIS — K219 Gastro-esophageal reflux disease without esophagitis: Secondary | ICD-10-CM

## 2020-12-29 DIAGNOSIS — E119 Type 2 diabetes mellitus without complications: Secondary | ICD-10-CM

## 2020-12-29 DIAGNOSIS — I2510 Atherosclerotic heart disease of native coronary artery without angina pectoris: Secondary | ICD-10-CM

## 2020-12-29 DIAGNOSIS — N189 Chronic kidney disease, unspecified: Secondary | ICD-10-CM

## 2020-12-29 DIAGNOSIS — C439 Malignant melanoma of skin, unspecified: Secondary | ICD-10-CM

## 2020-12-29 DIAGNOSIS — C4491 Basal cell carcinoma of skin, unspecified: Secondary | ICD-10-CM

## 2020-12-29 DIAGNOSIS — E785 Hyperlipidemia, unspecified: Secondary | ICD-10-CM

## 2020-12-29 DIAGNOSIS — I219 Acute myocardial infarction, unspecified: Secondary | ICD-10-CM

## 2020-12-29 DIAGNOSIS — Z9109 Other allergy status, other than to drugs and biological substances: Secondary | ICD-10-CM

## 2020-12-29 DIAGNOSIS — N400 Enlarged prostate without lower urinary tract symptoms: Secondary | ICD-10-CM

## 2020-12-29 DIAGNOSIS — I10 Essential (primary) hypertension: Principal | ICD-10-CM

## 2020-12-29 DIAGNOSIS — G629 Polyneuropathy, unspecified: Secondary | ICD-10-CM

## 2020-12-29 DIAGNOSIS — C4492 Squamous cell carcinoma of skin, unspecified: Secondary | ICD-10-CM

## 2020-12-29 DIAGNOSIS — M542 Cervicalgia: Secondary | ICD-10-CM

## 2020-12-29 DIAGNOSIS — M1990 Unspecified osteoarthritis, unspecified site: Secondary | ICD-10-CM

## 2020-12-29 DIAGNOSIS — F419 Anxiety disorder, unspecified: Secondary | ICD-10-CM

## 2020-12-29 DIAGNOSIS — I739 Peripheral vascular disease, unspecified: Secondary | ICD-10-CM

## 2020-12-29 DIAGNOSIS — Z9189 Other specified personal risk factors, not elsewhere classified: Secondary | ICD-10-CM

## 2020-12-30 ENCOUNTER — Ambulatory Visit: Payer: Medicare Other | Attending: Family Medicine | Primary: Family Medicine

## 2020-12-30 DIAGNOSIS — E538 Deficiency of other specified B group vitamins: Secondary | ICD-10-CM

## 2020-12-30 DIAGNOSIS — I10 Essential (primary) hypertension: Secondary | ICD-10-CM

## 2020-12-30 DIAGNOSIS — Z951 Presence of aortocoronary bypass graft: Secondary | ICD-10-CM

## 2020-12-30 DIAGNOSIS — I25810 Atherosclerosis of coronary artery bypass graft(s) without angina pectoris: Secondary | ICD-10-CM

## 2020-12-30 DIAGNOSIS — M542 Cervicalgia: Secondary | ICD-10-CM

## 2020-12-30 DIAGNOSIS — E1142 Type 2 diabetes mellitus with diabetic polyneuropathy: Secondary | ICD-10-CM

## 2020-12-30 DIAGNOSIS — N1831 Stage 3a chronic kidney disease (CMS-HCC: 138): Secondary | ICD-10-CM

## 2020-12-30 DIAGNOSIS — Z794 Long term (current) use of insulin: Secondary | ICD-10-CM

## 2020-12-30 DIAGNOSIS — M4802 Spinal stenosis, cervical region: Secondary | ICD-10-CM

## 2020-12-30 DIAGNOSIS — N400 Enlarged prostate without lower urinary tract symptoms: Principal | ICD-10-CM

## 2020-12-30 DIAGNOSIS — E1151 Type 2 diabetes mellitus with diabetic peripheral angiopathy without gangrene: Secondary | ICD-10-CM

## 2020-12-30 MED ORDER — HYDROCODONE-ACETAMINOPHEN 5-325 MG PO TABS
1 | ORAL_TABLET | Freq: Four times a day (QID) | ORAL | 0 refills | 30.00000 days | Status: CP | PRN
Start: 2020-12-30 — End: 2020-12-30

## 2020-12-30 MED ORDER — HYDROCODONE-ACETAMINOPHEN 5-325 MG PO TABS
1 | ORAL_TABLET | Freq: Four times a day (QID) | ORAL | 0 refills | 30.00000 days | Status: CP | PRN
Start: 2020-12-30 — End: ?

## 2020-12-30 MED ORDER — ONETOUCH ULTRA VI STRP
ORAL_STRIP | 0 refills | 50.00000 days | Status: CP
Start: 2020-12-30 — End: ?

## 2021-01-16 ENCOUNTER — Ambulatory Visit: Payer: Medicare Other | Attending: Podiatrist | Primary: Family Medicine

## 2021-01-16 ENCOUNTER — Encounter: Payer: Medicare Other | Primary: Family Medicine

## 2021-01-16 DIAGNOSIS — I739 Peripheral vascular disease, unspecified: Secondary | ICD-10-CM

## 2021-01-16 DIAGNOSIS — M2041 Other hammer toe(s) (acquired), right foot: Secondary | ICD-10-CM

## 2021-01-16 DIAGNOSIS — Z899 Acquired absence of limb, unspecified: Secondary | ICD-10-CM

## 2021-01-16 DIAGNOSIS — E1142 Type 2 diabetes mellitus with diabetic polyneuropathy: Secondary | ICD-10-CM

## 2021-01-16 DIAGNOSIS — M25572 Pain in left ankle and joints of left foot: Principal | ICD-10-CM

## 2021-01-16 DIAGNOSIS — L84 Corns and callosities: Secondary | ICD-10-CM

## 2021-01-27 ENCOUNTER — Ambulatory Visit: Payer: Medicare Other | Attending: Family Medicine | Primary: Family Medicine

## 2021-01-27 DIAGNOSIS — N432 Other hydrocele: Secondary | ICD-10-CM

## 2021-01-27 DIAGNOSIS — Z9189 Other specified personal risk factors, not elsewhere classified: Secondary | ICD-10-CM

## 2021-01-27 DIAGNOSIS — E119 Type 2 diabetes mellitus without complications: Secondary | ICD-10-CM

## 2021-01-27 DIAGNOSIS — I25810 Atherosclerosis of coronary artery bypass graft(s) without angina pectoris: Secondary | ICD-10-CM

## 2021-01-27 DIAGNOSIS — I10 Essential (primary) hypertension: Principal | ICD-10-CM

## 2021-01-27 DIAGNOSIS — I739 Peripheral vascular disease, unspecified: Secondary | ICD-10-CM

## 2021-01-27 DIAGNOSIS — E1151 Type 2 diabetes mellitus with diabetic peripheral angiopathy without gangrene: Secondary | ICD-10-CM

## 2021-01-27 DIAGNOSIS — E785 Hyperlipidemia, unspecified: Secondary | ICD-10-CM

## 2021-01-27 DIAGNOSIS — C4491 Basal cell carcinoma of skin, unspecified: Secondary | ICD-10-CM

## 2021-01-27 DIAGNOSIS — F419 Anxiety disorder, unspecified: Secondary | ICD-10-CM

## 2021-01-27 DIAGNOSIS — K219 Gastro-esophageal reflux disease without esophagitis: Secondary | ICD-10-CM

## 2021-01-27 DIAGNOSIS — M1990 Unspecified osteoarthritis, unspecified site: Secondary | ICD-10-CM

## 2021-01-27 DIAGNOSIS — G629 Polyneuropathy, unspecified: Secondary | ICD-10-CM

## 2021-01-27 DIAGNOSIS — C439 Malignant melanoma of skin, unspecified: Secondary | ICD-10-CM

## 2021-01-27 DIAGNOSIS — Z9109 Other allergy status, other than to drugs and biological substances: Secondary | ICD-10-CM

## 2021-01-27 DIAGNOSIS — I5189 Other ill-defined heart diseases: Secondary | ICD-10-CM

## 2021-01-27 DIAGNOSIS — N400 Enlarged prostate without lower urinary tract symptoms: Secondary | ICD-10-CM

## 2021-01-27 DIAGNOSIS — C4492 Squamous cell carcinoma of skin, unspecified: Secondary | ICD-10-CM

## 2021-01-27 DIAGNOSIS — N189 Chronic kidney disease, unspecified: Secondary | ICD-10-CM

## 2021-01-27 DIAGNOSIS — N1831 Stage 3a chronic kidney disease (CMS-HCC: 138): Secondary | ICD-10-CM

## 2021-01-27 DIAGNOSIS — I219 Acute myocardial infarction, unspecified: Secondary | ICD-10-CM

## 2021-01-27 DIAGNOSIS — M542 Cervicalgia: Secondary | ICD-10-CM

## 2021-01-27 DIAGNOSIS — Z6825 Body mass index (BMI) 25.0-25.9, adult: Principal | ICD-10-CM

## 2021-01-27 DIAGNOSIS — I2510 Atherosclerotic heart disease of native coronary artery without angina pectoris: Secondary | ICD-10-CM

## 2021-01-28 MED ORDER — HYDROCODONE-ACETAMINOPHEN 5-325 MG PO TABS
1 | ORAL_TABLET | Freq: Four times a day (QID) | ORAL | 0 refills | 30.00000 days | Status: CP | PRN
Start: 2021-01-28 — End: ?

## 2021-01-29 DIAGNOSIS — E875 Hyperkalemia: Secondary | ICD-10-CM

## 2021-01-29 DIAGNOSIS — N1831 Stage 3a chronic kidney disease (CMS-HCC: 138): Principal | ICD-10-CM

## 2021-02-11 ENCOUNTER — Encounter: Payer: Medicare Other | Attending: "Endocrinology | Primary: Family Medicine

## 2021-02-26 ENCOUNTER — Ambulatory Visit: Payer: Medicare Other | Attending: Family Medicine | Primary: Family Medicine

## 2021-02-26 DIAGNOSIS — E785 Hyperlipidemia, unspecified: Secondary | ICD-10-CM

## 2021-02-26 DIAGNOSIS — N189 Chronic kidney disease, unspecified: Secondary | ICD-10-CM

## 2021-02-26 DIAGNOSIS — N400 Enlarged prostate without lower urinary tract symptoms: Principal | ICD-10-CM

## 2021-02-26 DIAGNOSIS — M170 Bilateral primary osteoarthritis of knee: Secondary | ICD-10-CM

## 2021-02-26 DIAGNOSIS — I219 Acute myocardial infarction, unspecified: Secondary | ICD-10-CM

## 2021-02-26 DIAGNOSIS — M1990 Unspecified osteoarthritis, unspecified site: Secondary | ICD-10-CM

## 2021-02-26 DIAGNOSIS — C4492 Squamous cell carcinoma of skin, unspecified: Secondary | ICD-10-CM

## 2021-02-26 DIAGNOSIS — K219 Gastro-esophageal reflux disease without esophagitis: Secondary | ICD-10-CM

## 2021-02-26 DIAGNOSIS — C439 Malignant melanoma of skin, unspecified: Secondary | ICD-10-CM

## 2021-02-26 DIAGNOSIS — G629 Polyneuropathy, unspecified: Secondary | ICD-10-CM

## 2021-02-26 DIAGNOSIS — I739 Peripheral vascular disease, unspecified: Secondary | ICD-10-CM

## 2021-02-26 DIAGNOSIS — E119 Type 2 diabetes mellitus without complications: Secondary | ICD-10-CM

## 2021-02-26 DIAGNOSIS — R351 Nocturia: Secondary | ICD-10-CM

## 2021-02-26 DIAGNOSIS — F419 Anxiety disorder, unspecified: Secondary | ICD-10-CM

## 2021-02-26 DIAGNOSIS — M542 Cervicalgia: Secondary | ICD-10-CM

## 2021-02-26 DIAGNOSIS — N401 Enlarged prostate with lower urinary tract symptoms: Secondary | ICD-10-CM

## 2021-02-26 DIAGNOSIS — I10 Essential (primary) hypertension: Secondary | ICD-10-CM

## 2021-02-26 DIAGNOSIS — Z9109 Other allergy status, other than to drugs and biological substances: Secondary | ICD-10-CM

## 2021-02-26 DIAGNOSIS — E1151 Type 2 diabetes mellitus with diabetic peripheral angiopathy without gangrene: Secondary | ICD-10-CM

## 2021-02-26 DIAGNOSIS — C4491 Basal cell carcinoma of skin, unspecified: Secondary | ICD-10-CM

## 2021-02-26 DIAGNOSIS — I2510 Atherosclerotic heart disease of native coronary artery without angina pectoris: Secondary | ICD-10-CM

## 2021-02-26 DIAGNOSIS — I25810 Atherosclerosis of coronary artery bypass graft(s) without angina pectoris: Secondary | ICD-10-CM

## 2021-02-26 DIAGNOSIS — Z6824 Body mass index (BMI) 24.0-24.9, adult: Secondary | ICD-10-CM

## 2021-02-26 DIAGNOSIS — Z9189 Other specified personal risk factors, not elsewhere classified: Secondary | ICD-10-CM

## 2021-02-26 DIAGNOSIS — N1831 Stage 3a chronic kidney disease (CMS-HCC: 138): Secondary | ICD-10-CM

## 2021-02-26 DIAGNOSIS — N431 Infected hydrocele: Secondary | ICD-10-CM

## 2021-02-26 MED ORDER — HYDROCODONE-ACETAMINOPHEN 5-325 MG PO TABS
1 | ORAL_TABLET | Freq: Four times a day (QID) | ORAL | 0 refills | 30.00000 days | Status: CP | PRN
Start: 2021-02-26 — End: 2021-02-26

## 2021-02-26 MED ORDER — METOPROLOL SUCCINATE ER 50 MG PO TB24
50 mg | Freq: Two times a day (BID) | ORAL | 3 refills | Status: CP
Start: 2021-02-26 — End: ?

## 2021-02-26 MED ORDER — TAMSULOSIN HCL 0.4 MG PO CAPS
.4 mg | Freq: Every day | ORAL | 3 refills | Status: CP
Start: 2021-02-26 — End: ?

## 2021-02-26 MED ORDER — HYDROCODONE-ACETAMINOPHEN 5-325 MG PO TABS
1 | ORAL_TABLET | Freq: Four times a day (QID) | ORAL | 0 refills | 30.00000 days | Status: CP | PRN
Start: 2021-02-26 — End: ?

## 2021-03-12 MED ORDER — ONETOUCH ULTRA VI STRP
ORAL_STRIP | 0 refills | 50.00000 days | Status: CP
Start: 2021-03-12 — End: ?

## 2021-03-16 ENCOUNTER — Ambulatory Visit: Payer: Medicare Other | Attending: "Endocrinology | Primary: Family Medicine

## 2021-03-16 DIAGNOSIS — E1142 Type 2 diabetes mellitus with diabetic polyneuropathy: Principal | ICD-10-CM

## 2021-03-16 DIAGNOSIS — N400 Enlarged prostate without lower urinary tract symptoms: Secondary | ICD-10-CM

## 2021-03-16 DIAGNOSIS — F419 Anxiety disorder, unspecified: Secondary | ICD-10-CM

## 2021-03-16 DIAGNOSIS — E119 Type 2 diabetes mellitus without complications: Secondary | ICD-10-CM

## 2021-03-16 DIAGNOSIS — I739 Peripheral vascular disease, unspecified: Secondary | ICD-10-CM

## 2021-03-16 DIAGNOSIS — M542 Cervicalgia: Secondary | ICD-10-CM

## 2021-03-16 DIAGNOSIS — Z9109 Other allergy status, other than to drugs and biological substances: Secondary | ICD-10-CM

## 2021-03-16 DIAGNOSIS — N189 Chronic kidney disease, unspecified: Secondary | ICD-10-CM

## 2021-03-16 DIAGNOSIS — I219 Acute myocardial infarction, unspecified: Secondary | ICD-10-CM

## 2021-03-16 DIAGNOSIS — Z9189 Other specified personal risk factors, not elsewhere classified: Secondary | ICD-10-CM

## 2021-03-16 DIAGNOSIS — G629 Polyneuropathy, unspecified: Secondary | ICD-10-CM

## 2021-03-16 DIAGNOSIS — Z794 Long term (current) use of insulin: Secondary | ICD-10-CM

## 2021-03-16 DIAGNOSIS — C439 Malignant melanoma of skin, unspecified: Secondary | ICD-10-CM

## 2021-03-16 DIAGNOSIS — C4492 Squamous cell carcinoma of skin, unspecified: Secondary | ICD-10-CM

## 2021-03-16 DIAGNOSIS — M1990 Unspecified osteoarthritis, unspecified site: Secondary | ICD-10-CM

## 2021-03-16 DIAGNOSIS — E785 Hyperlipidemia, unspecified: Secondary | ICD-10-CM

## 2021-03-16 DIAGNOSIS — I10 Essential (primary) hypertension: Principal | ICD-10-CM

## 2021-03-16 DIAGNOSIS — K219 Gastro-esophageal reflux disease without esophagitis: Secondary | ICD-10-CM

## 2021-03-16 DIAGNOSIS — C4491 Basal cell carcinoma of skin, unspecified: Secondary | ICD-10-CM

## 2021-03-16 DIAGNOSIS — I2510 Atherosclerotic heart disease of native coronary artery without angina pectoris: Secondary | ICD-10-CM

## 2021-03-16 MED ORDER — METFORMIN HCL 1000 MG PO TABS
1000 mg | Freq: Two times a day (BID) | ORAL | 3 refills | Status: CP
Start: 2021-03-16 — End: ?

## 2021-03-16 MED ORDER — CEPHALEXIN 500 MG PO CAPS
500 mg | Freq: Four times a day (QID) | ORAL
Start: 2021-03-16 — End: ?

## 2021-03-27 DIAGNOSIS — N1831 Stage 3a chronic kidney disease (CMS-HCC: 138): Principal | ICD-10-CM

## 2021-04-27 ENCOUNTER — Encounter
Payer: Medicare Other | Attending: Student in an Organized Health Care Education/Training Program | Primary: Family Medicine

## 2021-04-28 ENCOUNTER — Encounter: Payer: Medicare Other | Attending: "Endocrinology | Primary: Family Medicine

## 2021-04-28 ENCOUNTER — Ambulatory Visit: Payer: Medicare Other | Attending: Family Medicine | Primary: Family Medicine

## 2021-04-28 DIAGNOSIS — E785 Hyperlipidemia, unspecified: Secondary | ICD-10-CM

## 2021-04-28 DIAGNOSIS — N400 Enlarged prostate without lower urinary tract symptoms: Principal | ICD-10-CM

## 2021-04-28 DIAGNOSIS — I739 Peripheral vascular disease, unspecified: Secondary | ICD-10-CM

## 2021-04-28 DIAGNOSIS — E119 Type 2 diabetes mellitus without complications: Secondary | ICD-10-CM

## 2021-04-28 DIAGNOSIS — C4492 Squamous cell carcinoma of skin, unspecified: Secondary | ICD-10-CM

## 2021-04-28 DIAGNOSIS — I10 Essential (primary) hypertension: Principal | ICD-10-CM

## 2021-04-28 DIAGNOSIS — Z9189 Other specified personal risk factors, not elsewhere classified: Secondary | ICD-10-CM

## 2021-04-28 DIAGNOSIS — M542 Cervicalgia: Secondary | ICD-10-CM

## 2021-04-28 DIAGNOSIS — Z9109 Other allergy status, other than to drugs and biological substances: Secondary | ICD-10-CM

## 2021-04-28 DIAGNOSIS — I25810 Atherosclerosis of coronary artery bypass graft(s) without angina pectoris: Secondary | ICD-10-CM

## 2021-04-28 DIAGNOSIS — M1990 Unspecified osteoarthritis, unspecified site: Secondary | ICD-10-CM

## 2021-04-28 DIAGNOSIS — K219 Gastro-esophageal reflux disease without esophagitis: Secondary | ICD-10-CM

## 2021-04-28 DIAGNOSIS — I219 Acute myocardial infarction, unspecified: Secondary | ICD-10-CM

## 2021-04-28 DIAGNOSIS — C439 Malignant melanoma of skin, unspecified: Secondary | ICD-10-CM

## 2021-04-28 DIAGNOSIS — I2510 Atherosclerotic heart disease of native coronary artery without angina pectoris: Secondary | ICD-10-CM

## 2021-04-28 DIAGNOSIS — G629 Polyneuropathy, unspecified: Secondary | ICD-10-CM

## 2021-04-28 DIAGNOSIS — N1831 Stage 3a chronic kidney disease (CMS-HCC: 138): Secondary | ICD-10-CM

## 2021-04-28 DIAGNOSIS — I5189 Other ill-defined heart diseases: Secondary | ICD-10-CM

## 2021-04-28 DIAGNOSIS — N189 Chronic kidney disease, unspecified: Secondary | ICD-10-CM

## 2021-04-28 DIAGNOSIS — F419 Anxiety disorder, unspecified: Secondary | ICD-10-CM

## 2021-04-28 DIAGNOSIS — C4491 Basal cell carcinoma of skin, unspecified: Secondary | ICD-10-CM

## 2021-04-28 MED ORDER — HYDROCODONE-ACETAMINOPHEN 5-325 MG PO TABS
1 | ORAL_TABLET | Freq: Four times a day (QID) | ORAL | 0 refills | 30.00000 days | Status: CP | PRN
Start: 2021-04-28 — End: 2021-04-28

## 2021-04-28 MED ORDER — HYDROCODONE-ACETAMINOPHEN 5-325 MG PO TABS
1 | ORAL_TABLET | Freq: Four times a day (QID) | ORAL | 0 refills | 30.00000 days | Status: CP | PRN
Start: 2021-04-28 — End: ?

## 2021-05-04 ENCOUNTER — Ambulatory Visit
Payer: Medicare Other | Attending: Student in an Organized Health Care Education/Training Program | Primary: Family Medicine

## 2021-05-04 DIAGNOSIS — M1990 Unspecified osteoarthritis, unspecified site: Secondary | ICD-10-CM

## 2021-05-04 DIAGNOSIS — D631 Anemia in chronic kidney disease: Secondary | ICD-10-CM

## 2021-05-04 DIAGNOSIS — C4492 Squamous cell carcinoma of skin, unspecified: Secondary | ICD-10-CM

## 2021-05-04 DIAGNOSIS — K219 Gastro-esophageal reflux disease without esophagitis: Secondary | ICD-10-CM

## 2021-05-04 DIAGNOSIS — N183 Benign hypertension with CKD (chronic kidney disease) stage III (CMS-HCC: 138): Secondary | ICD-10-CM

## 2021-05-04 DIAGNOSIS — I129 Hypertensive chronic kidney disease with stage 1 through stage 4 chronic kidney disease, or unspecified chronic kidney disease: Secondary | ICD-10-CM

## 2021-05-04 DIAGNOSIS — I10 Essential (primary) hypertension: Principal | ICD-10-CM

## 2021-05-04 DIAGNOSIS — N189 Chronic kidney disease, unspecified: Secondary | ICD-10-CM

## 2021-05-04 DIAGNOSIS — E119 Type 2 diabetes mellitus without complications: Secondary | ICD-10-CM

## 2021-05-04 DIAGNOSIS — Z9189 Other specified personal risk factors, not elsewhere classified: Secondary | ICD-10-CM

## 2021-05-04 DIAGNOSIS — C439 Malignant melanoma of skin, unspecified: Secondary | ICD-10-CM

## 2021-05-04 DIAGNOSIS — N400 Enlarged prostate without lower urinary tract symptoms: Secondary | ICD-10-CM

## 2021-05-04 DIAGNOSIS — F419 Anxiety disorder, unspecified: Secondary | ICD-10-CM

## 2021-05-04 DIAGNOSIS — Z9109 Other allergy status, other than to drugs and biological substances: Secondary | ICD-10-CM

## 2021-05-04 DIAGNOSIS — E785 Hyperlipidemia, unspecified: Secondary | ICD-10-CM

## 2021-05-04 DIAGNOSIS — E875 Hyperkalemia: Secondary | ICD-10-CM

## 2021-05-04 DIAGNOSIS — I739 Peripheral vascular disease, unspecified: Secondary | ICD-10-CM

## 2021-05-04 DIAGNOSIS — I219 Acute myocardial infarction, unspecified: Secondary | ICD-10-CM

## 2021-05-04 DIAGNOSIS — C4491 Basal cell carcinoma of skin, unspecified: Secondary | ICD-10-CM

## 2021-05-04 DIAGNOSIS — G629 Polyneuropathy, unspecified: Secondary | ICD-10-CM

## 2021-05-04 DIAGNOSIS — M542 Cervicalgia: Secondary | ICD-10-CM

## 2021-05-04 DIAGNOSIS — E1121 Type 2 diabetes mellitus with diabetic nephropathy: Secondary | ICD-10-CM

## 2021-05-04 DIAGNOSIS — N1832 Stage 3b chronic kidney disease (CMS-HCC: 138): Principal | ICD-10-CM

## 2021-05-04 DIAGNOSIS — I2510 Atherosclerotic heart disease of native coronary artery without angina pectoris: Secondary | ICD-10-CM

## 2021-05-04 MED ORDER — TORSEMIDE 5 MG PO TABS
5 mg | Freq: Every day | ORAL | 2 refills | Status: CP
Start: 2021-05-04 — End: ?

## 2021-05-05 ENCOUNTER — Ambulatory Visit: Payer: Medicare Other | Attending: "Nutrition | Primary: Family Medicine

## 2021-05-05 DIAGNOSIS — N189 Chronic kidney disease, unspecified: Secondary | ICD-10-CM

## 2021-05-05 DIAGNOSIS — F419 Anxiety disorder, unspecified: Secondary | ICD-10-CM

## 2021-05-05 DIAGNOSIS — E1142 Type 2 diabetes mellitus with diabetic polyneuropathy: Principal | ICD-10-CM

## 2021-05-05 DIAGNOSIS — Z794 Long term (current) use of insulin: Secondary | ICD-10-CM

## 2021-05-05 DIAGNOSIS — E785 Hyperlipidemia, unspecified: Secondary | ICD-10-CM

## 2021-05-05 DIAGNOSIS — C439 Malignant melanoma of skin, unspecified: Secondary | ICD-10-CM

## 2021-05-05 DIAGNOSIS — E119 Type 2 diabetes mellitus without complications: Secondary | ICD-10-CM

## 2021-05-05 DIAGNOSIS — Z9109 Other allergy status, other than to drugs and biological substances: Secondary | ICD-10-CM

## 2021-05-05 DIAGNOSIS — I10 Essential (primary) hypertension: Principal | ICD-10-CM

## 2021-05-05 DIAGNOSIS — K219 Gastro-esophageal reflux disease without esophagitis: Secondary | ICD-10-CM

## 2021-05-05 DIAGNOSIS — I2510 Atherosclerotic heart disease of native coronary artery without angina pectoris: Secondary | ICD-10-CM

## 2021-05-05 DIAGNOSIS — M1990 Unspecified osteoarthritis, unspecified site: Secondary | ICD-10-CM

## 2021-05-05 DIAGNOSIS — Z9189 Other specified personal risk factors, not elsewhere classified: Secondary | ICD-10-CM

## 2021-05-05 DIAGNOSIS — I739 Peripheral vascular disease, unspecified: Secondary | ICD-10-CM

## 2021-05-05 DIAGNOSIS — C4492 Squamous cell carcinoma of skin, unspecified: Secondary | ICD-10-CM

## 2021-05-05 DIAGNOSIS — N400 Enlarged prostate without lower urinary tract symptoms: Secondary | ICD-10-CM

## 2021-05-05 DIAGNOSIS — I219 Acute myocardial infarction, unspecified: Secondary | ICD-10-CM

## 2021-05-05 DIAGNOSIS — M542 Cervicalgia: Secondary | ICD-10-CM

## 2021-05-05 DIAGNOSIS — C4491 Basal cell carcinoma of skin, unspecified: Secondary | ICD-10-CM

## 2021-05-05 DIAGNOSIS — G629 Polyneuropathy, unspecified: Secondary | ICD-10-CM

## 2021-05-07 ENCOUNTER — Encounter
Payer: Medicare Other | Attending: Student in an Organized Health Care Education/Training Program | Primary: Family Medicine

## 2021-05-08 ENCOUNTER — Ambulatory Visit: Payer: Medicare Other | Attending: Podiatrist | Primary: Family Medicine

## 2021-05-08 DIAGNOSIS — M542 Cervicalgia: Secondary | ICD-10-CM

## 2021-05-08 DIAGNOSIS — G629 Polyneuropathy, unspecified: Secondary | ICD-10-CM

## 2021-05-08 DIAGNOSIS — I2510 Atherosclerotic heart disease of native coronary artery without angina pectoris: Secondary | ICD-10-CM

## 2021-05-08 DIAGNOSIS — E119 Type 2 diabetes mellitus without complications: Secondary | ICD-10-CM

## 2021-05-08 DIAGNOSIS — C4492 Squamous cell carcinoma of skin, unspecified: Secondary | ICD-10-CM

## 2021-05-08 DIAGNOSIS — N189 Chronic kidney disease, unspecified: Secondary | ICD-10-CM

## 2021-05-08 DIAGNOSIS — M1990 Unspecified osteoarthritis, unspecified site: Secondary | ICD-10-CM

## 2021-05-08 DIAGNOSIS — C4491 Basal cell carcinoma of skin, unspecified: Secondary | ICD-10-CM

## 2021-05-08 DIAGNOSIS — I219 Acute myocardial infarction, unspecified: Secondary | ICD-10-CM

## 2021-05-08 DIAGNOSIS — Z9189 Other specified personal risk factors, not elsewhere classified: Secondary | ICD-10-CM

## 2021-05-08 DIAGNOSIS — C439 Malignant melanoma of skin, unspecified: Secondary | ICD-10-CM

## 2021-05-08 DIAGNOSIS — E1142 Type 2 diabetes mellitus with diabetic polyneuropathy: Principal | ICD-10-CM

## 2021-05-08 DIAGNOSIS — N400 Enlarged prostate without lower urinary tract symptoms: Secondary | ICD-10-CM

## 2021-05-08 DIAGNOSIS — K219 Gastro-esophageal reflux disease without esophagitis: Secondary | ICD-10-CM

## 2021-05-08 DIAGNOSIS — E785 Hyperlipidemia, unspecified: Secondary | ICD-10-CM

## 2021-05-08 DIAGNOSIS — I739 Peripheral vascular disease, unspecified: Secondary | ICD-10-CM

## 2021-05-08 DIAGNOSIS — I10 Essential (primary) hypertension: Principal | ICD-10-CM

## 2021-05-08 DIAGNOSIS — F419 Anxiety disorder, unspecified: Secondary | ICD-10-CM

## 2021-05-08 DIAGNOSIS — Z899 Acquired absence of limb, unspecified: Secondary | ICD-10-CM

## 2021-05-08 DIAGNOSIS — Z9109 Other allergy status, other than to drugs and biological substances: Secondary | ICD-10-CM

## 2021-05-18 DIAGNOSIS — E875 Hyperkalemia: Secondary | ICD-10-CM

## 2021-05-18 DIAGNOSIS — N1832 Stage 3b chronic kidney disease (CMS-HCC: 138): Principal | ICD-10-CM

## 2021-05-22 ENCOUNTER — Emergency Department: Admit: 2021-05-22 | Payer: Medicare Other

## 2021-05-22 ENCOUNTER — Inpatient Hospital Stay: Admit: 2021-05-22 | Payer: Medicare Other

## 2021-05-22 DIAGNOSIS — T148XXA Other injury of unspecified body region, initial encounter: Secondary | ICD-10-CM

## 2021-05-22 DIAGNOSIS — W11XXXA Fall on and from ladder, initial encounter: Principal | ICD-10-CM

## 2021-05-22 DIAGNOSIS — S32599A Other specified fracture of unspecified pubis, initial encounter for closed fracture: Secondary | ICD-10-CM

## 2021-05-22 DIAGNOSIS — S329XXA Fracture of unspecified parts of lumbosacral spine and pelvis, initial encounter for closed fracture: Secondary | ICD-10-CM

## 2021-05-22 DIAGNOSIS — M25551 Pain in right hip: Secondary | ICD-10-CM

## 2021-05-22 DIAGNOSIS — M25559 Pain in unspecified hip: Secondary | ICD-10-CM

## 2021-05-22 DIAGNOSIS — Z9229 Personal history of other drug therapy: Secondary | ICD-10-CM

## 2021-05-22 DIAGNOSIS — T1490XA Injury, unspecified, initial encounter: Secondary | ICD-10-CM

## 2021-05-22 MED ORDER — OXYCODONE HCL 5 MG PO TABS
5 mg | Freq: Four times a day (QID) | ORAL | Status: CP | PRN
Start: 2021-05-22 — End: ?

## 2021-05-22 MED ORDER — ORAL CARBOHYDRATE FOR HYPOGLYCEMIA JX
4-8 [oz_av] | ORAL | Status: CP | PRN
Start: 2021-05-22 — End: ?

## 2021-05-22 MED ORDER — LIDOCAINE HCL (PF) 2 % IJ SOLN
Status: CP
Start: 2021-05-22 — End: ?

## 2021-05-22 MED ORDER — FENTANYL CITRATE INJ 50 MCG/ML CUSTOM AMP/VIAL
INTRAVENOUS | Status: CP
Start: 2021-05-22 — End: ?

## 2021-05-22 MED ORDER — METOPROLOL SUCCINATE ER 50 MG PO TB24
50 mg | Freq: Two times a day (BID) | ORAL | Status: DC
Start: 2021-05-22 — End: 2021-05-23

## 2021-05-22 MED ORDER — ONDANSETRON HCL 4 MG/2 ML IJ SOLN SH
Status: DC
Start: 2021-05-22 — End: 2021-05-23

## 2021-05-22 MED ORDER — MUPIROCIN 2 % EX OINT
Freq: Two times a day (BID) | NASAL | Status: CP
Start: 2021-05-22 — End: ?

## 2021-05-22 MED ORDER — INSULIN REGULAR HUMAN 100 UNIT/ML IJ SOLN
0-3 [IU] | Freq: Every evening | SUBCUTANEOUS | Status: DC
Start: 2021-05-22 — End: 2021-05-23

## 2021-05-22 MED ORDER — FENTANYL CITRATE INJ 50 MCG/ML CUSTOM AMP/VIAL
25-50 ug | INTRAVENOUS | Status: CP | PRN
Start: 2021-05-22 — End: ?

## 2021-05-22 MED ORDER — LACTATED RINGERS IV SOLN
1000 mL | INTRAVENOUS | Status: DC
Start: 2021-05-22 — End: 2021-05-23

## 2021-05-22 MED ORDER — INSULIN REGULAR HUMAN 100 UNIT/ML IJ SOLN
0-5 [IU] | Freq: Three times a day (TID) | SUBCUTANEOUS | Status: DC
Start: 2021-05-22 — End: 2021-05-23

## 2021-05-22 MED ORDER — HYDROMORPHONE HCL 2 MG/ML IJ SOLN JX
Status: DC
Start: 2021-05-22 — End: 2021-05-23

## 2021-05-22 MED ORDER — ATORVASTATIN CALCIUM 40 MG PO TABS
40 mg | Freq: Every day | ORAL | Status: CP
Start: 2021-05-22 — End: ?

## 2021-05-22 MED ORDER — CEFAZOLIN SODIUM 1 G IJ SOLR
2 g | Freq: Once | INTRAVENOUS | Status: CP
Start: 2021-05-22 — End: ?

## 2021-05-22 MED ORDER — LIDOCAINE HCL (PF) 2 % IJ SOLN SH
Freq: Once | SUBCUTANEOUS | Status: CP
Start: 2021-05-22 — End: ?

## 2021-05-22 MED ORDER — FENTANYL CITRATE INJ 50 MCG/ML CUSTOM AMP/VIAL
Status: CP
Start: 2021-05-22 — End: ?

## 2021-05-22 MED ORDER — SODIUM CHLORIDE 0.9% FOR FLUSHES
20-180 mL | Status: CP | PRN
Start: 2021-05-22 — End: ?

## 2021-05-22 MED ORDER — METHOCARBAMOL 500 MG PO TABS
500 mg | Freq: Four times a day (QID) | ORAL | Status: CP
Start: 2021-05-22 — End: ?

## 2021-05-22 MED ORDER — DEXTROSE 50 % IV SOLN
50 mL | INTRAVENOUS | Status: CP | PRN
Start: 2021-05-22 — End: ?

## 2021-05-22 MED ORDER — ONDANSETRON HCL 4 MG/2 ML IJ SOLN SH
4 mg | Freq: Once | INTRAVENOUS | Status: CP
Start: 2021-05-22 — End: ?

## 2021-05-22 MED ORDER — TAMSULOSIN HCL 0.4 MG PO CAPS
.4 mg | Freq: Every day | ORAL | Status: CP
Start: 2021-05-22 — End: ?

## 2021-05-22 MED ORDER — IOHEXOL 350 MG/ML IV SOLN SH
100 mL | Freq: Once | INTRAVENOUS | Status: CP
Start: 2021-05-22 — End: ?

## 2021-05-22 MED ORDER — ONDANSETRON HCL 4 MG/2 ML IJ SOLN SH
4 mg | Freq: Four times a day (QID) | INTRAVENOUS | Status: DC | PRN
Start: 2021-05-22 — End: 2021-05-23

## 2021-05-22 MED ORDER — GABAPENTIN 300 MG PO CAPS
600 mg | Freq: Three times a day (TID) | ORAL | Status: CP
Start: 2021-05-22 — End: ?

## 2021-05-22 MED ORDER — INSULIN REGULAR HUMAN 100 UNIT/ML IJ SOLN
0-3 [IU] | Freq: Every evening | SUBCUTANEOUS | Status: CP
Start: 2021-05-22 — End: ?

## 2021-05-22 MED ORDER — GLUCOSE 4 G PO CHEW JX
16 g | ORAL | Status: CP | PRN
Start: 2021-05-22 — End: ?

## 2021-05-22 MED ORDER — NALOXONE HCL 2 MG/2ML IJ SOSY
.2 mg | INTRAVENOUS | Status: CP | PRN
Start: 2021-05-22 — End: ?

## 2021-05-22 MED ORDER — INSULIN REGULAR HUMAN 100 UNIT/ML IJ SOLN
0-10 [IU] | Freq: Three times a day (TID) | SUBCUTANEOUS | Status: CP
Start: 2021-05-22 — End: ?

## 2021-05-22 MED ORDER — ACETAMINOPHEN 325 MG PO TABS
650 mg | Freq: Every day | ORAL | Status: CP
Start: 2021-05-22 — End: ?

## 2021-05-22 MED ORDER — OXYCODONE HCL 5 MG PO TABS
10 mg | Freq: Four times a day (QID) | ORAL | Status: CP | PRN
Start: 2021-05-22 — End: ?

## 2021-05-22 MED ORDER — LACTATED RINGERS IV SOLN
1000 mL | INTRAVENOUS | Status: CP
Start: 2021-05-22 — End: ?

## 2021-05-22 MED ORDER — ONDANSETRON HCL 4 MG/2 ML IJ SOLN SH
4 mg | INTRAVENOUS | Status: CP | PRN
Start: 2021-05-22 — End: ?

## 2021-05-22 MED ORDER — IOHEXOL 350 MG/ML IV SOLN SH
100 mL | Freq: Once | INTRA_ARTERIAL | Status: CP
Start: 2021-05-22 — End: ?

## 2021-05-22 MED ORDER — PROMETHAZINE HCL 25 MG/ML IJ SOLN
12.5 mg | Freq: Once | INTRAVENOUS | Status: CP
Start: 2021-05-22 — End: ?

## 2021-05-22 MED ORDER — METOPROLOL TARTRATE 25 MG PO TABS
25 mg | Freq: Two times a day (BID) | ORAL | Status: CP
Start: 2021-05-22 — End: ?

## 2021-05-22 MED ORDER — HYDROMORPHONE HCL 1 MG/ML IJ SOLN
.5 mg | INTRAVENOUS | Status: CP | PRN
Start: 2021-05-22 — End: ?

## 2021-05-22 MED ORDER — MIDAZOLAM HCL 2 MG/2ML IJ SOLN
.5-1 mg | INTRAVENOUS | Status: CP | PRN
Start: 2021-05-22 — End: ?

## 2021-05-22 MED ORDER — AMLODIPINE BESYLATE 10 MG PO TABS
10 mg | Freq: Every day | ORAL | Status: CP
Start: 2021-05-22 — End: ?

## 2021-05-23 DIAGNOSIS — C4492 Squamous cell carcinoma of skin, unspecified: Secondary | ICD-10-CM

## 2021-05-23 DIAGNOSIS — Z9189 Other specified personal risk factors, not elsewhere classified: Secondary | ICD-10-CM

## 2021-05-23 DIAGNOSIS — G629 Polyneuropathy, unspecified: Secondary | ICD-10-CM

## 2021-05-23 DIAGNOSIS — F419 Anxiety disorder, unspecified: Secondary | ICD-10-CM

## 2021-05-23 DIAGNOSIS — I739 Peripheral vascular disease, unspecified: Secondary | ICD-10-CM

## 2021-05-23 DIAGNOSIS — C439 Malignant melanoma of skin, unspecified: Secondary | ICD-10-CM

## 2021-05-23 DIAGNOSIS — M542 Cervicalgia: Secondary | ICD-10-CM

## 2021-05-23 DIAGNOSIS — E785 Hyperlipidemia, unspecified: Secondary | ICD-10-CM

## 2021-05-23 DIAGNOSIS — E119 Type 2 diabetes mellitus without complications: Secondary | ICD-10-CM

## 2021-05-23 DIAGNOSIS — N400 Enlarged prostate without lower urinary tract symptoms: Secondary | ICD-10-CM

## 2021-05-23 DIAGNOSIS — N189 Chronic kidney disease, unspecified: Secondary | ICD-10-CM

## 2021-05-23 DIAGNOSIS — K219 Gastro-esophageal reflux disease without esophagitis: Secondary | ICD-10-CM

## 2021-05-23 DIAGNOSIS — C4491 Basal cell carcinoma of skin, unspecified: Secondary | ICD-10-CM

## 2021-05-23 DIAGNOSIS — I2510 Atherosclerotic heart disease of native coronary artery without angina pectoris: Secondary | ICD-10-CM

## 2021-05-23 DIAGNOSIS — I10 Essential (primary) hypertension: Principal | ICD-10-CM

## 2021-05-23 DIAGNOSIS — I219 Acute myocardial infarction, unspecified: Secondary | ICD-10-CM

## 2021-05-23 DIAGNOSIS — M1990 Unspecified osteoarthritis, unspecified site: Secondary | ICD-10-CM

## 2021-05-23 DIAGNOSIS — Z9109 Other allergy status, other than to drugs and biological substances: Secondary | ICD-10-CM

## 2021-05-23 MED ORDER — CALCIUM CARBONATE ANTACID 500 MG PO CHEW
500 mg | Freq: Three times a day (TID) | ORAL | Status: CP
Start: 2021-05-23 — End: ?

## 2021-05-23 MED ORDER — LACTATED RINGERS IV SOLN
INTRAVENOUS | Status: DC
Start: 2021-05-23 — End: 2021-05-23

## 2021-05-23 MED ORDER — PATIENT MANAGED INSULIN PUMP 500 UNITS/ML JX
1.15 [IU]/h | SUBCUTANEOUS | Status: CP
Start: 2021-05-23 — End: ?

## 2021-05-23 MED ORDER — MAGNESIUM OXIDE 400 (240 MG) MG PO TABS
800 mg | Freq: Once | ORAL | Status: CP
Start: 2021-05-23 — End: ?

## 2021-05-23 MED ORDER — INSULIN REGULAR HUMAN 100 UNIT/ML IJ SOLN
0-5 [IU] | Freq: Four times a day (QID) | SUBCUTANEOUS | Status: DC
Start: 2021-05-23 — End: 2021-05-23

## 2021-05-23 MED ORDER — HEPARIN SODIUM (PORCINE) 5000 UNIT/ML IJ SOLN
5000 [IU] | Freq: Three times a day (TID) | SUBCUTANEOUS | Status: CP
Start: 2021-05-23 — End: ?

## 2021-05-23 MED ORDER — INSULIN ASPART 100 UNIT/ML IJ SOLN
10 [IU] | Freq: Once | SUBCUTANEOUS | Status: DC
Start: 2021-05-23 — End: 2021-05-23

## 2021-05-24 DIAGNOSIS — I10 Essential (primary) hypertension: Principal | ICD-10-CM

## 2021-05-24 MED ORDER — INSULIN ASPART 100 UNIT/ML IJ SOLN
5 [IU] | Freq: Once | SUBCUTANEOUS | Status: CP
Start: 2021-05-24 — End: ?

## 2021-05-24 MED ORDER — GABAPENTIN 300 MG PO CAPS
300 mg | Freq: Three times a day (TID) | ORAL | Status: CP
Start: 2021-05-24 — End: ?

## 2021-05-25 MED ORDER — LOSARTAN POTASSIUM 50 MG PO TABS
0 refills | Status: CP
Start: 2021-05-25 — End: ?

## 2021-05-25 MED ORDER — DOCUSATE SODIUM 100 MG PO CAPS
100 mg | Freq: Two times a day (BID) | ORAL | Status: CP
Start: 2021-05-25 — End: ?

## 2021-05-25 MED ORDER — ONETOUCH ULTRA VI STRP
ORAL_STRIP | 0 refills | 50.00000 days | Status: CP
Start: 2021-05-25 — End: ?

## 2021-05-27 DIAGNOSIS — E119 Type 2 diabetes mellitus without complications: Secondary | ICD-10-CM

## 2021-05-27 DIAGNOSIS — Z9189 Other specified personal risk factors, not elsewhere classified: Secondary | ICD-10-CM

## 2021-05-27 DIAGNOSIS — C4492 Squamous cell carcinoma of skin, unspecified: Secondary | ICD-10-CM

## 2021-05-27 DIAGNOSIS — I219 Acute myocardial infarction, unspecified: Secondary | ICD-10-CM

## 2021-05-27 DIAGNOSIS — M542 Cervicalgia: Secondary | ICD-10-CM

## 2021-05-27 DIAGNOSIS — C439 Malignant melanoma of skin, unspecified: Secondary | ICD-10-CM

## 2021-05-27 DIAGNOSIS — N189 Chronic kidney disease, unspecified: Secondary | ICD-10-CM

## 2021-05-27 DIAGNOSIS — I10 Essential (primary) hypertension: Principal | ICD-10-CM

## 2021-05-27 DIAGNOSIS — K219 Gastro-esophageal reflux disease without esophagitis: Secondary | ICD-10-CM

## 2021-05-27 DIAGNOSIS — G629 Polyneuropathy, unspecified: Secondary | ICD-10-CM

## 2021-05-27 DIAGNOSIS — I2510 Atherosclerotic heart disease of native coronary artery without angina pectoris: Secondary | ICD-10-CM

## 2021-05-27 DIAGNOSIS — F419 Anxiety disorder, unspecified: Secondary | ICD-10-CM

## 2021-05-27 DIAGNOSIS — E785 Hyperlipidemia, unspecified: Secondary | ICD-10-CM

## 2021-05-27 DIAGNOSIS — I739 Peripheral vascular disease, unspecified: Secondary | ICD-10-CM

## 2021-05-27 DIAGNOSIS — C4491 Basal cell carcinoma of skin, unspecified: Secondary | ICD-10-CM

## 2021-05-27 DIAGNOSIS — N400 Enlarged prostate without lower urinary tract symptoms: Secondary | ICD-10-CM

## 2021-05-27 DIAGNOSIS — Z9109 Other allergy status, other than to drugs and biological substances: Secondary | ICD-10-CM

## 2021-05-27 DIAGNOSIS — M1990 Unspecified osteoarthritis, unspecified site: Secondary | ICD-10-CM

## 2021-05-27 MED ORDER — POLYETHYLENE GLYCOL 3350 17 G PO PACK
17 g | Freq: Every day | ORAL | Status: CP
Start: 2021-05-27 — End: ?

## 2021-05-28 MED ORDER — PATIENT MANAGED INSULIN PUMP 500 UNITS/ML JX
1-15 [IU]/h | SUBCUTANEOUS | Status: CP
Start: 2021-05-28 — End: ?

## 2021-05-29 DIAGNOSIS — E119 Type 2 diabetes mellitus without complications: Secondary | ICD-10-CM

## 2021-05-29 DIAGNOSIS — F419 Anxiety disorder, unspecified: Secondary | ICD-10-CM

## 2021-05-29 DIAGNOSIS — I10 Essential (primary) hypertension: Principal | ICD-10-CM

## 2021-05-29 DIAGNOSIS — K219 Gastro-esophageal reflux disease without esophagitis: Secondary | ICD-10-CM

## 2021-05-29 DIAGNOSIS — C4491 Basal cell carcinoma of skin, unspecified: Secondary | ICD-10-CM

## 2021-05-29 DIAGNOSIS — N400 Enlarged prostate without lower urinary tract symptoms: Secondary | ICD-10-CM

## 2021-05-29 DIAGNOSIS — C4492 Squamous cell carcinoma of skin, unspecified: Secondary | ICD-10-CM

## 2021-05-29 DIAGNOSIS — N189 Chronic kidney disease, unspecified: Secondary | ICD-10-CM

## 2021-05-29 DIAGNOSIS — I2510 Atherosclerotic heart disease of native coronary artery without angina pectoris: Secondary | ICD-10-CM

## 2021-05-29 DIAGNOSIS — E785 Hyperlipidemia, unspecified: Secondary | ICD-10-CM

## 2021-05-29 DIAGNOSIS — Z9109 Other allergy status, other than to drugs and biological substances: Secondary | ICD-10-CM

## 2021-05-29 DIAGNOSIS — M542 Cervicalgia: Secondary | ICD-10-CM

## 2021-05-29 DIAGNOSIS — I739 Peripheral vascular disease, unspecified: Secondary | ICD-10-CM

## 2021-05-29 DIAGNOSIS — C439 Malignant melanoma of skin, unspecified: Secondary | ICD-10-CM

## 2021-05-29 DIAGNOSIS — Z9189 Other specified personal risk factors, not elsewhere classified: Secondary | ICD-10-CM

## 2021-05-29 DIAGNOSIS — M1990 Unspecified osteoarthritis, unspecified site: Secondary | ICD-10-CM

## 2021-05-29 DIAGNOSIS — I219 Acute myocardial infarction, unspecified: Secondary | ICD-10-CM

## 2021-05-29 DIAGNOSIS — G629 Polyneuropathy, unspecified: Secondary | ICD-10-CM

## 2021-05-29 MED ORDER — ACETAMINOPHEN 325 MG PO TABS
650 mg | Freq: Four times a day (QID) | ORAL | 0 refills | 17.00000 days | Status: CP | PRN
Start: 2021-05-29 — End: ?

## 2021-05-29 MED ORDER — METHOCARBAMOL 500 MG PO TABS
500 mg | Freq: Four times a day (QID) | ORAL | 0 refills | Status: CP
Start: 2021-05-29 — End: ?

## 2021-06-01 DIAGNOSIS — I25118 Atherosclerotic heart disease of native coronary artery with other forms of angina pectoris: Principal | ICD-10-CM

## 2021-06-01 MED ORDER — ATORVASTATIN CALCIUM 40 MG PO TABS
ORAL | 0 refills | 90.00000 days | Status: CP
Start: 2021-06-01 — End: ?

## 2021-06-03 ENCOUNTER — Encounter: Payer: Medicare Other | Attending: Podiatrist | Primary: Family Medicine

## 2021-06-03 ENCOUNTER — Encounter: Attending: Orthopaedic Surgery | Primary: Family Medicine

## 2021-06-11 ENCOUNTER — Ambulatory Visit: Payer: Medicare Other | Attending: "Endocrinology | Primary: Family Medicine

## 2021-06-11 DIAGNOSIS — I10 Essential (primary) hypertension: Secondary | ICD-10-CM

## 2021-06-11 DIAGNOSIS — N1832 Stage 3b chronic kidney disease (CMS-HCC: 138): Secondary | ICD-10-CM

## 2021-06-11 DIAGNOSIS — E7849 Other hyperlipidemia: Secondary | ICD-10-CM

## 2021-06-11 DIAGNOSIS — Z794 Long term (current) use of insulin: Secondary | ICD-10-CM

## 2021-06-11 DIAGNOSIS — E1142 Type 2 diabetes mellitus with diabetic polyneuropathy: Principal | ICD-10-CM

## 2021-06-11 MED ORDER — ACCU-CHEK GUIDE W/DEVICE KIT
0 refills | Status: CP
Start: 2021-06-11 — End: ?

## 2021-06-11 MED ORDER — ACCU-CHEK GUIDE VI STRP
1 | ORAL_STRIP | Freq: Three times a day (TID) | 11 refills | 50.00000 days | Status: CP
Start: 2021-06-11 — End: ?

## 2021-06-15 DIAGNOSIS — T1490XA Injury, unspecified, initial encounter: Principal | ICD-10-CM

## 2021-06-16 MED ORDER — METHOCARBAMOL 500 MG PO TABS
500 mg | Freq: Four times a day (QID) | ORAL | 0 refills | Status: CP
Start: 2021-06-16 — End: ?

## 2021-06-23 DIAGNOSIS — T1490XA Injury, unspecified, initial encounter: Principal | ICD-10-CM

## 2021-06-23 MED ORDER — METHOCARBAMOL 500 MG PO TABS
500 mg | Freq: Three times a day (TID) | ORAL | 0 refills | Status: CP
Start: 2021-06-23 — End: ?

## 2021-07-01 ENCOUNTER — Ambulatory Visit: Payer: Medicare Other | Attending: Podiatrist | Primary: Family Medicine

## 2021-07-01 DIAGNOSIS — N189 Chronic kidney disease, unspecified: Secondary | ICD-10-CM

## 2021-07-01 DIAGNOSIS — Z899 Acquired absence of limb, unspecified: Secondary | ICD-10-CM

## 2021-07-01 DIAGNOSIS — C4491 Basal cell carcinoma of skin, unspecified: Secondary | ICD-10-CM

## 2021-07-01 DIAGNOSIS — Z9109 Other allergy status, other than to drugs and biological substances: Secondary | ICD-10-CM

## 2021-07-01 DIAGNOSIS — F419 Anxiety disorder, unspecified: Secondary | ICD-10-CM

## 2021-07-01 DIAGNOSIS — G629 Polyneuropathy, unspecified: Secondary | ICD-10-CM

## 2021-07-01 DIAGNOSIS — I2510 Atherosclerotic heart disease of native coronary artery without angina pectoris: Secondary | ICD-10-CM

## 2021-07-01 DIAGNOSIS — C4492 Squamous cell carcinoma of skin, unspecified: Secondary | ICD-10-CM

## 2021-07-01 DIAGNOSIS — C439 Malignant melanoma of skin, unspecified: Secondary | ICD-10-CM

## 2021-07-01 DIAGNOSIS — M542 Cervicalgia: Secondary | ICD-10-CM

## 2021-07-01 DIAGNOSIS — I739 Peripheral vascular disease, unspecified: Secondary | ICD-10-CM

## 2021-07-01 DIAGNOSIS — I10 Essential (primary) hypertension: Principal | ICD-10-CM

## 2021-07-01 DIAGNOSIS — E119 Type 2 diabetes mellitus without complications: Secondary | ICD-10-CM

## 2021-07-01 DIAGNOSIS — E1142 Type 2 diabetes mellitus with diabetic polyneuropathy: Principal | ICD-10-CM

## 2021-07-01 DIAGNOSIS — N400 Enlarged prostate without lower urinary tract symptoms: Secondary | ICD-10-CM

## 2021-07-01 DIAGNOSIS — E785 Hyperlipidemia, unspecified: Secondary | ICD-10-CM

## 2021-07-01 DIAGNOSIS — K219 Gastro-esophageal reflux disease without esophagitis: Secondary | ICD-10-CM

## 2021-07-01 DIAGNOSIS — Z9189 Other specified personal risk factors, not elsewhere classified: Secondary | ICD-10-CM

## 2021-07-01 DIAGNOSIS — I219 Acute myocardial infarction, unspecified: Secondary | ICD-10-CM

## 2021-07-01 DIAGNOSIS — M1990 Unspecified osteoarthritis, unspecified site: Secondary | ICD-10-CM

## 2021-07-15 ENCOUNTER — Ambulatory Visit: Payer: Medicare Other | Attending: Family Medicine | Primary: Family Medicine

## 2021-07-15 DIAGNOSIS — M1990 Unspecified osteoarthritis, unspecified site: Secondary | ICD-10-CM

## 2021-07-15 DIAGNOSIS — Z981 Arthrodesis status: Secondary | ICD-10-CM

## 2021-07-15 DIAGNOSIS — I219 Acute myocardial infarction, unspecified: Secondary | ICD-10-CM

## 2021-07-15 DIAGNOSIS — M542 Cervicalgia: Secondary | ICD-10-CM

## 2021-07-15 DIAGNOSIS — S3289XD Fracture of other parts of pelvis, subsequent encounter for fracture with routine healing: Secondary | ICD-10-CM

## 2021-07-15 DIAGNOSIS — N400 Enlarged prostate without lower urinary tract symptoms: Principal | ICD-10-CM

## 2021-07-15 DIAGNOSIS — N1831 Stage 3a chronic kidney disease (CMS-HCC: 138): Secondary | ICD-10-CM

## 2021-07-15 DIAGNOSIS — E785 Hyperlipidemia, unspecified: Secondary | ICD-10-CM

## 2021-07-15 DIAGNOSIS — N189 Chronic kidney disease, unspecified: Secondary | ICD-10-CM

## 2021-07-15 DIAGNOSIS — G629 Polyneuropathy, unspecified: Secondary | ICD-10-CM

## 2021-07-15 DIAGNOSIS — I10 Essential (primary) hypertension: Secondary | ICD-10-CM

## 2021-07-15 DIAGNOSIS — Z9641 Presence of insulin pump (external) (internal): Secondary | ICD-10-CM

## 2021-07-15 DIAGNOSIS — C439 Malignant melanoma of skin, unspecified: Secondary | ICD-10-CM

## 2021-07-15 DIAGNOSIS — Z9109 Other allergy status, other than to drugs and biological substances: Secondary | ICD-10-CM

## 2021-07-15 DIAGNOSIS — M549 Dorsalgia, unspecified: Secondary | ICD-10-CM

## 2021-07-15 DIAGNOSIS — E1151 Type 2 diabetes mellitus with diabetic peripheral angiopathy without gangrene: Secondary | ICD-10-CM

## 2021-07-15 DIAGNOSIS — I25810 Atherosclerosis of coronary artery bypass graft(s) without angina pectoris: Secondary | ICD-10-CM

## 2021-07-15 DIAGNOSIS — Z7409 Other reduced mobility: Secondary | ICD-10-CM

## 2021-07-15 DIAGNOSIS — Z85820 Personal history of malignant melanoma of skin: Secondary | ICD-10-CM

## 2021-07-15 DIAGNOSIS — C4492 Squamous cell carcinoma of skin, unspecified: Secondary | ICD-10-CM

## 2021-07-15 DIAGNOSIS — K219 Gastro-esophageal reflux disease without esophagitis: Secondary | ICD-10-CM

## 2021-07-15 DIAGNOSIS — C4491 Basal cell carcinoma of skin, unspecified: Secondary | ICD-10-CM

## 2021-07-15 DIAGNOSIS — M170 Bilateral primary osteoarthritis of knee: Secondary | ICD-10-CM

## 2021-07-15 DIAGNOSIS — Z951 Presence of aortocoronary bypass graft: Secondary | ICD-10-CM

## 2021-07-15 DIAGNOSIS — I739 Peripheral vascular disease, unspecified: Secondary | ICD-10-CM

## 2021-07-15 DIAGNOSIS — I2510 Atherosclerotic heart disease of native coronary artery without angina pectoris: Secondary | ICD-10-CM

## 2021-07-15 DIAGNOSIS — Z9189 Other specified personal risk factors, not elsewhere classified: Secondary | ICD-10-CM

## 2021-07-15 DIAGNOSIS — F419 Anxiety disorder, unspecified: Secondary | ICD-10-CM

## 2021-07-15 DIAGNOSIS — E119 Type 2 diabetes mellitus without complications: Secondary | ICD-10-CM

## 2021-07-15 DIAGNOSIS — I5189 Other ill-defined heart diseases: Secondary | ICD-10-CM

## 2021-07-15 DIAGNOSIS — Z6825 Body mass index (BMI) 25.0-25.9, adult: Secondary | ICD-10-CM

## 2021-07-15 MED ORDER — HYDROCODONE-ACETAMINOPHEN 5-325 MG PO TABS
1 | ORAL_TABLET | Freq: Four times a day (QID) | ORAL | 0 refills | 30.00000 days | Status: CP | PRN
Start: 2021-07-15 — End: 2021-07-15

## 2021-07-15 MED ORDER — HYDROCODONE-ACETAMINOPHEN 5-325 MG PO TABS
1 | ORAL_TABLET | Freq: Four times a day (QID) | ORAL | 0 refills | 30.00000 days | Status: CP | PRN
Start: 2021-07-15 — End: ?

## 2021-07-16 DIAGNOSIS — M4802 Spinal stenosis, cervical region: Principal | ICD-10-CM

## 2021-07-16 DIAGNOSIS — S3289XD Fracture of other parts of pelvis, subsequent encounter for fracture with routine healing: Secondary | ICD-10-CM

## 2021-07-16 DIAGNOSIS — I5189 Other ill-defined heart diseases: Secondary | ICD-10-CM

## 2021-07-16 DIAGNOSIS — Z7409 Other reduced mobility: Secondary | ICD-10-CM

## 2021-07-16 DIAGNOSIS — S32511D Fracture of superior rim of right pubis, subsequent encounter for fracture with routine healing: Secondary | ICD-10-CM

## 2021-07-16 DIAGNOSIS — M67911 Unspecified disorder of synovium and tendon, right shoulder: Secondary | ICD-10-CM

## 2021-08-04 DIAGNOSIS — E875 Hyperkalemia: Secondary | ICD-10-CM

## 2021-08-04 DIAGNOSIS — N1832 Stage 3b chronic kidney disease (CMS-HCC: 138): Principal | ICD-10-CM

## 2021-08-04 DIAGNOSIS — E1121 Type 2 diabetes mellitus with diabetic nephropathy: Secondary | ICD-10-CM

## 2021-08-20 DIAGNOSIS — T1490XA Injury, unspecified, initial encounter: Secondary | ICD-10-CM

## 2021-08-20 DIAGNOSIS — I10 Essential (primary) hypertension: Secondary | ICD-10-CM

## 2021-08-20 DIAGNOSIS — I25118 Atherosclerotic heart disease of native coronary artery with other forms of angina pectoris: Principal | ICD-10-CM

## 2021-08-24 MED ORDER — CLOPIDOGREL BISULFATE 75 MG PO TABS
75 mg | Freq: Every day | ORAL | 3 refills | 90.00000 days | Status: CP
Start: 2021-08-24 — End: ?

## 2021-08-24 MED ORDER — AMLODIPINE BESYLATE 10 MG PO TABS
10 mg | Freq: Every day | ORAL | 3 refills | 90.00000 days | Status: CP
Start: 2021-08-24 — End: ?

## 2021-08-24 MED ORDER — METHOCARBAMOL 500 MG PO TABS
500 mg | Freq: Three times a day (TID) | ORAL | 0 refills | Status: CP
Start: 2021-08-24 — End: ?

## 2021-08-24 MED ORDER — LOSARTAN POTASSIUM 50 MG PO TABS
0 refills | Status: CP
Start: 2021-08-24 — End: ?

## 2021-09-07 ENCOUNTER — Ambulatory Visit: Payer: Medicare Other | Attending: Orthopaedic Surgery | Primary: Family Medicine

## 2021-09-07 ENCOUNTER — Encounter: Payer: Medicare Other | Primary: Family Medicine

## 2021-09-07 ENCOUNTER — Encounter: Primary: Family Medicine

## 2021-09-07 ENCOUNTER — Encounter
Payer: Medicare Other | Attending: Student in an Organized Health Care Education/Training Program | Primary: Family Medicine

## 2021-09-07 ENCOUNTER — Ambulatory Visit
Payer: Medicare Other | Attending: Student in an Organized Health Care Education/Training Program | Primary: Family Medicine

## 2021-09-07 DIAGNOSIS — I10 Essential (primary) hypertension: Secondary | ICD-10-CM

## 2021-09-07 DIAGNOSIS — C439 Malignant melanoma of skin, unspecified: Secondary | ICD-10-CM

## 2021-09-07 DIAGNOSIS — E785 Hyperlipidemia, unspecified: Secondary | ICD-10-CM

## 2021-09-07 DIAGNOSIS — C4491 Basal cell carcinoma of skin, unspecified: Secondary | ICD-10-CM

## 2021-09-07 DIAGNOSIS — N183 Benign hypertension with CKD (chronic kidney disease) stage III (CMS-HCC: 138): Secondary | ICD-10-CM

## 2021-09-07 DIAGNOSIS — E1121 Type 2 diabetes mellitus with diabetic nephropathy: Secondary | ICD-10-CM

## 2021-09-07 DIAGNOSIS — Z9189 Other specified personal risk factors, not elsewhere classified: Secondary | ICD-10-CM

## 2021-09-07 DIAGNOSIS — E872 Metabolic acidosis: Secondary | ICD-10-CM

## 2021-09-07 DIAGNOSIS — M1990 Unspecified osteoarthritis, unspecified site: Secondary | ICD-10-CM

## 2021-09-07 DIAGNOSIS — N1832 Stage 3b chronic kidney disease (CMS-HCC: 138): Principal | ICD-10-CM

## 2021-09-07 DIAGNOSIS — K219 Gastro-esophageal reflux disease without esophagitis: Secondary | ICD-10-CM

## 2021-09-07 DIAGNOSIS — N189 Chronic kidney disease, unspecified: Secondary | ICD-10-CM

## 2021-09-07 DIAGNOSIS — D631 Anemia in chronic kidney disease: Secondary | ICD-10-CM

## 2021-09-07 DIAGNOSIS — I2510 Atherosclerotic heart disease of native coronary artery without angina pectoris: Secondary | ICD-10-CM

## 2021-09-07 DIAGNOSIS — E559 Vitamin D deficiency, unspecified: Secondary | ICD-10-CM

## 2021-09-07 DIAGNOSIS — S3289XD Fracture of other parts of pelvis, subsequent encounter for fracture with routine healing: Principal | ICD-10-CM

## 2021-09-07 DIAGNOSIS — F419 Anxiety disorder, unspecified: Secondary | ICD-10-CM

## 2021-09-07 DIAGNOSIS — M542 Cervicalgia: Secondary | ICD-10-CM

## 2021-09-07 DIAGNOSIS — G629 Polyneuropathy, unspecified: Secondary | ICD-10-CM

## 2021-09-07 DIAGNOSIS — I739 Peripheral vascular disease, unspecified: Secondary | ICD-10-CM

## 2021-09-07 DIAGNOSIS — E119 Type 2 diabetes mellitus without complications: Secondary | ICD-10-CM

## 2021-09-07 DIAGNOSIS — I129 Hypertensive chronic kidney disease with stage 1 through stage 4 chronic kidney disease, or unspecified chronic kidney disease: Secondary | ICD-10-CM

## 2021-09-07 DIAGNOSIS — C4492 Squamous cell carcinoma of skin, unspecified: Secondary | ICD-10-CM

## 2021-09-07 DIAGNOSIS — N400 Enlarged prostate without lower urinary tract symptoms: Secondary | ICD-10-CM

## 2021-09-07 DIAGNOSIS — I219 Acute myocardial infarction, unspecified: Secondary | ICD-10-CM

## 2021-09-07 DIAGNOSIS — Z9109 Other allergy status, other than to drugs and biological substances: Secondary | ICD-10-CM

## 2021-09-07 MED ORDER — AMLODIPINE BESYLATE 10 MG PO TABS
10 mg | Freq: Every day | ORAL | 3 refills | Status: CP
Start: 2021-09-07 — End: ?

## 2021-09-07 MED ORDER — CHOLECALCIFEROL 25 MCG (1000 UT) PO CAPS
1000 [IU] | Freq: Every day | ORAL | 3 refills | Status: CP
Start: 2021-09-07 — End: ?

## 2021-09-08 ENCOUNTER — Encounter: Payer: Medicare Other | Attending: "Nutrition | Primary: Family Medicine

## 2021-09-09 ENCOUNTER — Ambulatory Visit: Payer: Medicare Other | Attending: Medical | Primary: Family Medicine

## 2021-09-09 DIAGNOSIS — S3289XD Fracture of other parts of pelvis, subsequent encounter for fracture with routine healing: Secondary | ICD-10-CM

## 2021-09-09 DIAGNOSIS — C439 Malignant melanoma of skin, unspecified: Secondary | ICD-10-CM

## 2021-09-09 DIAGNOSIS — Z6826 Body mass index (BMI) 26.0-26.9, adult: Principal | ICD-10-CM

## 2021-09-09 DIAGNOSIS — C4492 Squamous cell carcinoma of skin, unspecified: Secondary | ICD-10-CM

## 2021-09-09 DIAGNOSIS — M170 Bilateral primary osteoarthritis of knee: Secondary | ICD-10-CM

## 2021-09-09 DIAGNOSIS — I2510 Atherosclerotic heart disease of native coronary artery without angina pectoris: Secondary | ICD-10-CM

## 2021-09-09 DIAGNOSIS — M542 Cervicalgia: Secondary | ICD-10-CM

## 2021-09-09 DIAGNOSIS — I219 Acute myocardial infarction, unspecified: Secondary | ICD-10-CM

## 2021-09-09 DIAGNOSIS — E119 Type 2 diabetes mellitus without complications: Secondary | ICD-10-CM

## 2021-09-09 DIAGNOSIS — C4491 Basal cell carcinoma of skin, unspecified: Secondary | ICD-10-CM

## 2021-09-09 DIAGNOSIS — N400 Enlarged prostate without lower urinary tract symptoms: Secondary | ICD-10-CM

## 2021-09-09 DIAGNOSIS — M1990 Unspecified osteoarthritis, unspecified site: Secondary | ICD-10-CM

## 2021-09-09 DIAGNOSIS — I10 Essential (primary) hypertension: Principal | ICD-10-CM

## 2021-09-09 DIAGNOSIS — E785 Hyperlipidemia, unspecified: Secondary | ICD-10-CM

## 2021-09-09 DIAGNOSIS — Z9189 Other specified personal risk factors, not elsewhere classified: Secondary | ICD-10-CM

## 2021-09-09 DIAGNOSIS — I739 Peripheral vascular disease, unspecified: Secondary | ICD-10-CM

## 2021-09-09 DIAGNOSIS — Z9109 Other allergy status, other than to drugs and biological substances: Secondary | ICD-10-CM

## 2021-09-09 DIAGNOSIS — N189 Chronic kidney disease, unspecified: Secondary | ICD-10-CM

## 2021-09-09 DIAGNOSIS — K219 Gastro-esophageal reflux disease without esophagitis: Secondary | ICD-10-CM

## 2021-09-09 DIAGNOSIS — F419 Anxiety disorder, unspecified: Secondary | ICD-10-CM

## 2021-09-09 DIAGNOSIS — G629 Polyneuropathy, unspecified: Secondary | ICD-10-CM

## 2021-09-09 MED ORDER — HYDROCODONE-ACETAMINOPHEN 5-325 MG PO TABS
1 | ORAL_TABLET | Freq: Four times a day (QID) | ORAL | 0 refills | 30.00000 days | Status: CP | PRN
Start: 2021-09-09 — End: ?

## 2021-09-10 MED ORDER — ONETOUCH ULTRA VI STRP
ORAL_STRIP | 0 refills | 50.00000 days | Status: CP
Start: 2021-09-10 — End: ?

## 2021-09-22 ENCOUNTER — Encounter: Payer: Medicare Other | Attending: "Endocrinology | Primary: Family Medicine

## 2021-09-25 DIAGNOSIS — M170 Bilateral primary osteoarthritis of knee: Principal | ICD-10-CM

## 2021-09-25 DIAGNOSIS — S3289XD Fracture of other parts of pelvis, subsequent encounter for fracture with routine healing: Secondary | ICD-10-CM

## 2021-09-25 MED ORDER — HYDROCODONE-ACETAMINOPHEN 5-325 MG PO TABS
1 | ORAL_TABLET | Freq: Four times a day (QID) | ORAL | 0 refills | 30.00000 days | Status: CP | PRN
Start: 2021-09-25 — End: ?

## 2021-10-10 MED ORDER — GABAPENTIN 600 MG PO TABS
0 refills | Status: CP
Start: 2021-10-10 — End: ?

## 2021-10-15 DIAGNOSIS — I10 Essential (primary) hypertension: Principal | ICD-10-CM

## 2021-10-19 MED ORDER — LOSARTAN POTASSIUM 50 MG PO TABS
1 refills
Start: 2021-10-19 — End: ?

## 2021-10-28 DIAGNOSIS — I10 Essential (primary) hypertension: Secondary | ICD-10-CM

## 2021-10-28 MED ORDER — LOSARTAN POTASSIUM 50 MG PO TABS
0 refills | Status: CP
Start: 2021-10-28 — End: ?

## 2021-10-28 MED ORDER — GABAPENTIN 600 MG PO TABS
600 mg | Freq: Three times a day (TID) | ORAL | 0 refills | Status: CP
Start: 2021-10-28 — End: ?

## 2021-11-02 ENCOUNTER — Ambulatory Visit: Payer: Medicare Other | Attending: Family Medicine | Primary: Family Medicine

## 2021-11-02 DIAGNOSIS — I739 Peripheral vascular disease, unspecified: Secondary | ICD-10-CM

## 2021-11-02 DIAGNOSIS — R519 Frequent headaches: Secondary | ICD-10-CM

## 2021-11-02 DIAGNOSIS — I219 Acute myocardial infarction, unspecified: Secondary | ICD-10-CM

## 2021-11-02 DIAGNOSIS — R2681 Unsteadiness on feet: Secondary | ICD-10-CM

## 2021-11-02 DIAGNOSIS — S3289XD Fracture of other parts of pelvis, subsequent encounter for fracture with routine healing: Secondary | ICD-10-CM

## 2021-11-02 DIAGNOSIS — R351 Nocturia: Secondary | ICD-10-CM

## 2021-11-02 DIAGNOSIS — C4492 Squamous cell carcinoma of skin, unspecified: Secondary | ICD-10-CM

## 2021-11-02 DIAGNOSIS — M1990 Unspecified osteoarthritis, unspecified site: Secondary | ICD-10-CM

## 2021-11-02 DIAGNOSIS — Z9109 Other allergy status, other than to drugs and biological substances: Secondary | ICD-10-CM

## 2021-11-02 DIAGNOSIS — C4491 Basal cell carcinoma of skin, unspecified: Principal | ICD-10-CM

## 2021-11-02 DIAGNOSIS — I25118 Atherosclerotic heart disease of native coronary artery with other forms of angina pectoris: Secondary | ICD-10-CM

## 2021-11-02 DIAGNOSIS — I10 Essential (primary) hypertension: Secondary | ICD-10-CM

## 2021-11-02 DIAGNOSIS — N189 Chronic kidney disease, unspecified: Secondary | ICD-10-CM

## 2021-11-02 DIAGNOSIS — I2510 Atherosclerotic heart disease of native coronary artery without angina pectoris: Secondary | ICD-10-CM

## 2021-11-02 DIAGNOSIS — C439 Malignant melanoma of skin, unspecified: Secondary | ICD-10-CM

## 2021-11-02 DIAGNOSIS — E119 Type 2 diabetes mellitus without complications: Secondary | ICD-10-CM

## 2021-11-02 DIAGNOSIS — N1831 Stage 3a chronic kidney disease (CMS-HCC: 138): Secondary | ICD-10-CM

## 2021-11-02 DIAGNOSIS — N401 Enlarged prostate with lower urinary tract symptoms: Secondary | ICD-10-CM

## 2021-11-02 DIAGNOSIS — E1151 Type 2 diabetes mellitus with diabetic peripheral angiopathy without gangrene: Secondary | ICD-10-CM

## 2021-11-02 DIAGNOSIS — S3289XS Fracture of other parts of pelvis, sequela: Secondary | ICD-10-CM

## 2021-11-02 DIAGNOSIS — I25810 Atherosclerosis of coronary artery bypass graft(s) without angina pectoris: Secondary | ICD-10-CM

## 2021-11-02 DIAGNOSIS — M542 Cervicalgia: Secondary | ICD-10-CM

## 2021-11-02 DIAGNOSIS — F419 Anxiety disorder, unspecified: Secondary | ICD-10-CM

## 2021-11-02 DIAGNOSIS — T1490XA Injury, unspecified, initial encounter: Secondary | ICD-10-CM

## 2021-11-02 DIAGNOSIS — Z9189 Other specified personal risk factors, not elsewhere classified: Secondary | ICD-10-CM

## 2021-11-02 DIAGNOSIS — Z6826 Body mass index (BMI) 26.0-26.9, adult: Principal | ICD-10-CM

## 2021-11-02 DIAGNOSIS — G629 Polyneuropathy, unspecified: Secondary | ICD-10-CM

## 2021-11-02 DIAGNOSIS — I5189 Other ill-defined heart diseases: Secondary | ICD-10-CM

## 2021-11-02 DIAGNOSIS — N400 Enlarged prostate without lower urinary tract symptoms: Secondary | ICD-10-CM

## 2021-11-02 DIAGNOSIS — E785 Hyperlipidemia, unspecified: Secondary | ICD-10-CM

## 2021-11-02 DIAGNOSIS — K219 Gastro-esophageal reflux disease without esophagitis: Secondary | ICD-10-CM

## 2021-11-02 DIAGNOSIS — Z951 Presence of aortocoronary bypass graft: Secondary | ICD-10-CM

## 2021-11-02 DIAGNOSIS — M4802 Spinal stenosis, cervical region: Secondary | ICD-10-CM

## 2021-11-02 DIAGNOSIS — M170 Bilateral primary osteoarthritis of knee: Secondary | ICD-10-CM

## 2021-11-02 MED ORDER — TAMSULOSIN HCL 0.4 MG PO CAPS
.4 mg | Freq: Every day | ORAL | 3 refills | Status: CP
Start: 2021-11-02 — End: ?

## 2021-11-02 MED ORDER — CLOPIDOGREL BISULFATE 75 MG PO TABS
75 mg | Freq: Every day | ORAL | 3 refills | 90.00000 days | Status: CP
Start: 2021-11-02 — End: ?

## 2021-11-02 MED ORDER — HYDROCODONE-ACETAMINOPHEN 5-325 MG PO TABS
1 | ORAL_TABLET | Freq: Four times a day (QID) | ORAL | 0 refills | 30.00000 days | Status: CP | PRN
Start: 2021-11-02 — End: ?

## 2021-11-02 MED ORDER — METHOCARBAMOL 500 MG PO TABS
500 mg | Freq: Three times a day (TID) | ORAL | 2 refills | Status: CP
Start: 2021-11-02 — End: ?

## 2021-11-02 MED ORDER — LOSARTAN POTASSIUM 50 MG PO TABS
0 refills | 30.00 days | Status: CP
Start: 2021-11-02 — End: ?

## 2021-11-02 MED ORDER — ATORVASTATIN CALCIUM 40 MG PO TABS
40 mg | Freq: Every day | ORAL | 3 refills | Status: CP
Start: 2021-11-02 — End: ?

## 2021-11-06 ENCOUNTER — Ambulatory Visit: Payer: Medicare Other | Attending: Family | Primary: Family Medicine

## 2021-11-06 DIAGNOSIS — H8110 Benign paroxysmal vertigo, unspecified ear: Principal | ICD-10-CM

## 2021-11-06 DIAGNOSIS — N400 Enlarged prostate without lower urinary tract symptoms: Secondary | ICD-10-CM

## 2021-11-06 DIAGNOSIS — Z9189 Other specified personal risk factors, not elsewhere classified: Secondary | ICD-10-CM

## 2021-11-06 DIAGNOSIS — G629 Polyneuropathy, unspecified: Secondary | ICD-10-CM

## 2021-11-06 DIAGNOSIS — I739 Peripheral vascular disease, unspecified: Secondary | ICD-10-CM

## 2021-11-06 DIAGNOSIS — K219 Gastro-esophageal reflux disease without esophagitis: Secondary | ICD-10-CM

## 2021-11-06 DIAGNOSIS — C4491 Basal cell carcinoma of skin, unspecified: Secondary | ICD-10-CM

## 2021-11-06 DIAGNOSIS — C439 Malignant melanoma of skin, unspecified: Secondary | ICD-10-CM

## 2021-11-06 DIAGNOSIS — I219 Acute myocardial infarction, unspecified: Secondary | ICD-10-CM

## 2021-11-06 DIAGNOSIS — I10 Essential (primary) hypertension: Principal | ICD-10-CM

## 2021-11-06 DIAGNOSIS — E785 Hyperlipidemia, unspecified: Secondary | ICD-10-CM

## 2021-11-06 DIAGNOSIS — M542 Cervicalgia: Secondary | ICD-10-CM

## 2021-11-06 DIAGNOSIS — I2510 Atherosclerotic heart disease of native coronary artery without angina pectoris: Secondary | ICD-10-CM

## 2021-11-06 DIAGNOSIS — C4492 Squamous cell carcinoma of skin, unspecified: Secondary | ICD-10-CM

## 2021-11-06 DIAGNOSIS — N189 Chronic kidney disease, unspecified: Secondary | ICD-10-CM

## 2021-11-06 DIAGNOSIS — M1990 Unspecified osteoarthritis, unspecified site: Secondary | ICD-10-CM

## 2021-11-06 DIAGNOSIS — J302 Other seasonal allergic rhinitis: Secondary | ICD-10-CM

## 2021-11-06 DIAGNOSIS — Z6826 Body mass index (BMI) 26.0-26.9, adult: Secondary | ICD-10-CM

## 2021-11-06 DIAGNOSIS — F419 Anxiety disorder, unspecified: Secondary | ICD-10-CM

## 2021-11-06 DIAGNOSIS — R519 Acute intractable headache, unspecified headache type: Secondary | ICD-10-CM

## 2021-11-06 DIAGNOSIS — Z9109 Other allergy status, other than to drugs and biological substances: Secondary | ICD-10-CM

## 2021-11-06 DIAGNOSIS — E119 Type 2 diabetes mellitus without complications: Secondary | ICD-10-CM

## 2021-11-06 DIAGNOSIS — R202 Paresthesia of skin: Secondary | ICD-10-CM

## 2021-11-06 MED ORDER — CETIRIZINE HCL 10 MG PO TABS
10 mg | Freq: Every day | ORAL | 3 refills | Status: CP
Start: 2021-11-06 — End: ?

## 2021-11-06 MED ORDER — FLUTICASONE PROPIONATE 50 MCG/ACT NA SUSP
3 refills | Status: CP
Start: 2021-11-06 — End: ?

## 2021-11-11 ENCOUNTER — Encounter: Payer: Medicare Other | Attending: Family Medicine | Primary: Family Medicine

## 2021-11-17 ENCOUNTER — Telehealth: Payer: Medicare Other | Attending: Nurse Practitioner | Primary: Family Medicine

## 2021-11-17 DIAGNOSIS — I2510 Atherosclerotic heart disease of native coronary artery without angina pectoris: Secondary | ICD-10-CM

## 2021-11-17 DIAGNOSIS — M1990 Unspecified osteoarthritis, unspecified site: Secondary | ICD-10-CM

## 2021-11-17 DIAGNOSIS — G629 Polyneuropathy, unspecified: Secondary | ICD-10-CM

## 2021-11-17 DIAGNOSIS — M47812 Spondylosis without myelopathy or radiculopathy, cervical region: Secondary | ICD-10-CM

## 2021-11-17 DIAGNOSIS — R519 Acute intractable headache, unspecified headache type: Secondary | ICD-10-CM

## 2021-11-17 DIAGNOSIS — Z9109 Other allergy status, other than to drugs and biological substances: Secondary | ICD-10-CM

## 2021-11-17 DIAGNOSIS — H8110 Benign paroxysmal vertigo, unspecified ear: Principal | ICD-10-CM

## 2021-11-17 DIAGNOSIS — Z9189 Other specified personal risk factors, not elsewhere classified: Secondary | ICD-10-CM

## 2021-11-17 DIAGNOSIS — C4492 Squamous cell carcinoma of skin, unspecified: Secondary | ICD-10-CM

## 2021-11-17 DIAGNOSIS — R202 Paresthesia of skin: Secondary | ICD-10-CM

## 2021-11-17 DIAGNOSIS — K219 Gastro-esophageal reflux disease without esophagitis: Secondary | ICD-10-CM

## 2021-11-17 DIAGNOSIS — E119 Type 2 diabetes mellitus without complications: Secondary | ICD-10-CM

## 2021-11-17 DIAGNOSIS — R251 Tremor, unspecified: Secondary | ICD-10-CM

## 2021-11-17 DIAGNOSIS — C439 Malignant melanoma of skin, unspecified: Secondary | ICD-10-CM

## 2021-11-17 DIAGNOSIS — M542 Cervicalgia: Secondary | ICD-10-CM

## 2021-11-17 DIAGNOSIS — N189 Chronic kidney disease, unspecified: Secondary | ICD-10-CM

## 2021-11-17 DIAGNOSIS — F419 Anxiety disorder, unspecified: Secondary | ICD-10-CM

## 2021-11-17 DIAGNOSIS — G4486 Cervicogenic headache: Secondary | ICD-10-CM

## 2021-11-17 DIAGNOSIS — C4491 Basal cell carcinoma of skin, unspecified: Secondary | ICD-10-CM

## 2021-11-17 DIAGNOSIS — I739 Peripheral vascular disease, unspecified: Secondary | ICD-10-CM

## 2021-11-17 DIAGNOSIS — I10 Essential (primary) hypertension: Principal | ICD-10-CM

## 2021-11-17 DIAGNOSIS — E785 Hyperlipidemia, unspecified: Secondary | ICD-10-CM

## 2021-11-17 DIAGNOSIS — I219 Acute myocardial infarction, unspecified: Secondary | ICD-10-CM

## 2021-11-17 DIAGNOSIS — N400 Enlarged prostate without lower urinary tract symptoms: Secondary | ICD-10-CM

## 2021-11-24 DIAGNOSIS — H8110 Benign paroxysmal vertigo, unspecified ear: Principal | ICD-10-CM

## 2021-11-25 DIAGNOSIS — I10 Essential (primary) hypertension: Principal | ICD-10-CM

## 2021-11-25 MED ORDER — HYDROCHLOROTHIAZIDE 12.5 MG PO CAPS
0 refills | Status: CP
Start: 2021-11-25 — End: ?

## 2021-11-30 ENCOUNTER — Inpatient Hospital Stay: Admit: 2021-11-30 | Discharge: 2021-12-01 | Payer: Medicare Other | Primary: Family Medicine

## 2021-11-30 DIAGNOSIS — R519 Acute intractable headache, unspecified headache type: Principal | ICD-10-CM

## 2021-12-01 ENCOUNTER — Ambulatory Visit: Payer: Medicare Other | Attending: "Endocrinology | Primary: Family Medicine

## 2021-12-01 DIAGNOSIS — N189 Chronic kidney disease, unspecified: Secondary | ICD-10-CM

## 2021-12-01 DIAGNOSIS — I739 Peripheral vascular disease, unspecified: Secondary | ICD-10-CM

## 2021-12-01 DIAGNOSIS — Z794 Long term (current) use of insulin: Secondary | ICD-10-CM

## 2021-12-01 DIAGNOSIS — I10 Essential (primary) hypertension: Principal | ICD-10-CM

## 2021-12-01 DIAGNOSIS — Z9189 Other specified personal risk factors, not elsewhere classified: Secondary | ICD-10-CM

## 2021-12-01 DIAGNOSIS — E1142 Type 2 diabetes mellitus with diabetic polyneuropathy: Principal | ICD-10-CM

## 2021-12-01 DIAGNOSIS — I2510 Atherosclerotic heart disease of native coronary artery without angina pectoris: Secondary | ICD-10-CM

## 2021-12-01 DIAGNOSIS — G629 Polyneuropathy, unspecified: Secondary | ICD-10-CM

## 2021-12-01 DIAGNOSIS — I219 Acute myocardial infarction, unspecified: Secondary | ICD-10-CM

## 2021-12-01 DIAGNOSIS — K219 Gastro-esophageal reflux disease without esophagitis: Secondary | ICD-10-CM

## 2021-12-01 DIAGNOSIS — C4491 Basal cell carcinoma of skin, unspecified: Secondary | ICD-10-CM

## 2021-12-01 DIAGNOSIS — M542 Cervicalgia: Secondary | ICD-10-CM

## 2021-12-01 DIAGNOSIS — C439 Malignant melanoma of skin, unspecified: Secondary | ICD-10-CM

## 2021-12-01 DIAGNOSIS — E7849 Other hyperlipidemia: Secondary | ICD-10-CM

## 2021-12-01 DIAGNOSIS — Z9109 Other allergy status, other than to drugs and biological substances: Secondary | ICD-10-CM

## 2021-12-01 DIAGNOSIS — E785 Hyperlipidemia, unspecified: Secondary | ICD-10-CM

## 2021-12-01 DIAGNOSIS — F419 Anxiety disorder, unspecified: Secondary | ICD-10-CM

## 2021-12-01 DIAGNOSIS — C4492 Squamous cell carcinoma of skin, unspecified: Secondary | ICD-10-CM

## 2021-12-01 DIAGNOSIS — N1832 Stage 3b chronic kidney disease (CMS-HCC: 138): Secondary | ICD-10-CM

## 2021-12-01 DIAGNOSIS — N400 Enlarged prostate without lower urinary tract symptoms: Secondary | ICD-10-CM

## 2021-12-01 DIAGNOSIS — E119 Type 2 diabetes mellitus without complications: Secondary | ICD-10-CM

## 2021-12-01 DIAGNOSIS — M1990 Unspecified osteoarthritis, unspecified site: Secondary | ICD-10-CM

## 2021-12-01 MED ORDER — INSULIN ASPART 100 UNIT/ML IJ SOLN
SUBCUTANEOUS | 3 refills | 33.00000 days | Status: CP
Start: 2021-12-01 — End: ?

## 2021-12-01 MED ORDER — HYDRALAZINE HCL 25 MG PO TABS: Start: 2021-12-01 — End: ?

## 2021-12-03 ENCOUNTER — Ambulatory Visit: Payer: Medicare Other | Attending: Family Medicine | Primary: Family Medicine

## 2021-12-03 DIAGNOSIS — M542 Cervicalgia: Secondary | ICD-10-CM

## 2021-12-03 DIAGNOSIS — G629 Polyneuropathy, unspecified: Secondary | ICD-10-CM

## 2021-12-03 DIAGNOSIS — R42 Dizziness and giddiness: Secondary | ICD-10-CM

## 2021-12-03 DIAGNOSIS — M1990 Unspecified osteoarthritis, unspecified site: Secondary | ICD-10-CM

## 2021-12-03 DIAGNOSIS — N400 Enlarged prostate without lower urinary tract symptoms: Secondary | ICD-10-CM

## 2021-12-03 DIAGNOSIS — Z6826 Body mass index (BMI) 26.0-26.9, adult: Principal | ICD-10-CM

## 2021-12-03 DIAGNOSIS — C439 Malignant melanoma of skin, unspecified: Secondary | ICD-10-CM

## 2021-12-03 DIAGNOSIS — Z9189 Other specified personal risk factors, not elsewhere classified: Secondary | ICD-10-CM

## 2021-12-03 DIAGNOSIS — I219 Acute myocardial infarction, unspecified: Secondary | ICD-10-CM

## 2021-12-03 DIAGNOSIS — E785 Hyperlipidemia, unspecified: Secondary | ICD-10-CM

## 2021-12-03 DIAGNOSIS — E1151 Type 2 diabetes mellitus with diabetic peripheral angiopathy without gangrene: Secondary | ICD-10-CM

## 2021-12-03 DIAGNOSIS — N189 Chronic kidney disease, unspecified: Secondary | ICD-10-CM

## 2021-12-03 DIAGNOSIS — I10 Essential (primary) hypertension: Principal | ICD-10-CM

## 2021-12-03 DIAGNOSIS — I739 Peripheral vascular disease, unspecified: Secondary | ICD-10-CM

## 2021-12-03 DIAGNOSIS — Z9109 Other allergy status, other than to drugs and biological substances: Secondary | ICD-10-CM

## 2021-12-03 DIAGNOSIS — F419 Anxiety disorder, unspecified: Secondary | ICD-10-CM

## 2021-12-03 DIAGNOSIS — I2510 Atherosclerotic heart disease of native coronary artery without angina pectoris: Secondary | ICD-10-CM

## 2021-12-03 DIAGNOSIS — C4491 Basal cell carcinoma of skin, unspecified: Secondary | ICD-10-CM

## 2021-12-03 DIAGNOSIS — E119 Type 2 diabetes mellitus without complications: Secondary | ICD-10-CM

## 2021-12-03 DIAGNOSIS — K219 Gastro-esophageal reflux disease without esophagitis: Secondary | ICD-10-CM

## 2021-12-03 DIAGNOSIS — C4492 Squamous cell carcinoma of skin, unspecified: Secondary | ICD-10-CM

## 2021-12-04 MED ORDER — HUMALOG 100 UNIT/ML IJ SOLN
SUBCUTANEOUS | 11 refills | 34.00000 days | Status: CP
Start: 2021-12-04 — End: ?

## 2021-12-05 DIAGNOSIS — N1832 Stage 3b chronic kidney disease (CMS-HCC: 138): Principal | ICD-10-CM

## 2021-12-05 DIAGNOSIS — E1121 Type 2 diabetes mellitus with diabetic nephropathy: Secondary | ICD-10-CM

## 2021-12-05 DIAGNOSIS — E872 Metabolic acidosis: Secondary | ICD-10-CM

## 2021-12-06 ENCOUNTER — Inpatient Hospital Stay: Admit: 2021-12-06 | Discharge: 2021-12-07 | Payer: Medicare Other | Primary: Family Medicine

## 2021-12-06 DIAGNOSIS — R519 Acute intractable headache, unspecified headache type: Principal | ICD-10-CM

## 2021-12-07 ENCOUNTER — Ambulatory Visit: Payer: Medicare Other | Attending: Acute Care | Primary: Family Medicine

## 2021-12-07 ENCOUNTER — Encounter: Payer: Medicare Other | Attending: Family | Primary: Family Medicine

## 2021-12-07 DIAGNOSIS — M4802 Spinal stenosis, cervical region: Principal | ICD-10-CM

## 2021-12-08 ENCOUNTER — Encounter: Primary: Family Medicine

## 2021-12-14 ENCOUNTER — Ambulatory Visit
Admit: 2021-12-14 | Discharge: 2021-12-15 | Payer: Medicare Other | Attending: Student in an Organized Health Care Education/Training Program | Primary: Family Medicine

## 2021-12-14 DIAGNOSIS — L84 Corns and callosities: Secondary | ICD-10-CM

## 2021-12-14 DIAGNOSIS — M2041 Other hammer toe(s) (acquired), right foot: Secondary | ICD-10-CM

## 2021-12-14 DIAGNOSIS — Z899 Acquired absence of limb, unspecified: Secondary | ICD-10-CM

## 2021-12-14 DIAGNOSIS — E1142 Type 2 diabetes mellitus with diabetic polyneuropathy: Principal | ICD-10-CM

## 2021-12-14 DIAGNOSIS — B351 Tinea unguium: Secondary | ICD-10-CM

## 2021-12-15 DIAGNOSIS — C4491 Basal cell carcinoma of skin, unspecified: Secondary | ICD-10-CM

## 2021-12-15 DIAGNOSIS — E119 Type 2 diabetes mellitus without complications: Secondary | ICD-10-CM

## 2021-12-15 DIAGNOSIS — G629 Polyneuropathy, unspecified: Secondary | ICD-10-CM

## 2021-12-15 DIAGNOSIS — E785 Hyperlipidemia, unspecified: Secondary | ICD-10-CM

## 2021-12-15 DIAGNOSIS — M1990 Unspecified osteoarthritis, unspecified site: Secondary | ICD-10-CM

## 2021-12-15 DIAGNOSIS — F419 Anxiety disorder, unspecified: Secondary | ICD-10-CM

## 2021-12-15 DIAGNOSIS — Z9189 Other specified personal risk factors, not elsewhere classified: Secondary | ICD-10-CM

## 2021-12-15 DIAGNOSIS — C439 Malignant melanoma of skin, unspecified: Secondary | ICD-10-CM

## 2021-12-15 DIAGNOSIS — Z9109 Other allergy status, other than to drugs and biological substances: Secondary | ICD-10-CM

## 2021-12-15 DIAGNOSIS — I219 Acute myocardial infarction, unspecified: Secondary | ICD-10-CM

## 2021-12-15 DIAGNOSIS — K219 Gastro-esophageal reflux disease without esophagitis: Secondary | ICD-10-CM

## 2021-12-15 DIAGNOSIS — N400 Enlarged prostate without lower urinary tract symptoms: Secondary | ICD-10-CM

## 2021-12-15 DIAGNOSIS — M542 Cervicalgia: Secondary | ICD-10-CM

## 2021-12-15 DIAGNOSIS — I739 Peripheral vascular disease, unspecified: Secondary | ICD-10-CM

## 2021-12-15 DIAGNOSIS — I2510 Atherosclerotic heart disease of native coronary artery without angina pectoris: Secondary | ICD-10-CM

## 2021-12-15 DIAGNOSIS — N189 Chronic kidney disease, unspecified: Secondary | ICD-10-CM

## 2021-12-15 DIAGNOSIS — I10 Essential (primary) hypertension: Principal | ICD-10-CM

## 2021-12-15 DIAGNOSIS — C4492 Squamous cell carcinoma of skin, unspecified: Secondary | ICD-10-CM

## 2021-12-16 ENCOUNTER — Ambulatory Visit: Payer: Medicare Other | Attending: Physician Assistant | Primary: Family Medicine

## 2021-12-16 DIAGNOSIS — M47812 Spondylosis without myelopathy or radiculopathy, cervical region: Principal | ICD-10-CM

## 2021-12-16 DIAGNOSIS — M4712 Other spondylosis with myelopathy, cervical region: Secondary | ICD-10-CM

## 2021-12-18 ENCOUNTER — Inpatient Hospital Stay: Admit: 2021-12-18 | Discharge: 2021-12-19 | Payer: Medicare Other | Primary: Family Medicine

## 2021-12-18 DIAGNOSIS — I639 Cerebral infarction, unspecified: Principal | ICD-10-CM

## 2021-12-29 ENCOUNTER — Inpatient Hospital Stay: Admit: 2021-12-29 | Discharge: 2021-12-30 | Payer: Medicare Other | Primary: Family Medicine

## 2021-12-29 DIAGNOSIS — M4712 Other spondylosis with myelopathy, cervical region: Secondary | ICD-10-CM

## 2021-12-29 DIAGNOSIS — M47812 Spondylosis without myelopathy or radiculopathy, cervical region: Principal | ICD-10-CM

## 2021-12-30 ENCOUNTER — Encounter: Payer: Medicare Other | Attending: Podiatrist | Primary: Family Medicine

## 2021-12-30 ENCOUNTER — Ambulatory Visit: Payer: Medicare Other | Attending: Family Medicine | Primary: Family Medicine

## 2021-12-30 DIAGNOSIS — N189 Chronic kidney disease, unspecified: Secondary | ICD-10-CM

## 2021-12-30 DIAGNOSIS — M170 Bilateral primary osteoarthritis of knee: Secondary | ICD-10-CM

## 2021-12-30 DIAGNOSIS — E785 Hyperlipidemia, unspecified: Secondary | ICD-10-CM

## 2021-12-30 DIAGNOSIS — N1831 Stage 3a chronic kidney disease (CMS-HCC: 138): Secondary | ICD-10-CM

## 2021-12-30 DIAGNOSIS — C439 Malignant melanoma of skin, unspecified: Secondary | ICD-10-CM

## 2021-12-30 DIAGNOSIS — I129 Hypertensive chronic kidney disease with stage 1 through stage 4 chronic kidney disease, or unspecified chronic kidney disease: Secondary | ICD-10-CM

## 2021-12-30 DIAGNOSIS — I5189 Other ill-defined heart diseases: Secondary | ICD-10-CM

## 2021-12-30 DIAGNOSIS — N183 Benign hypertension with CKD (chronic kidney disease) stage III (CMS-HCC: 138): Secondary | ICD-10-CM

## 2021-12-30 DIAGNOSIS — S3289XD Fracture of other parts of pelvis, subsequent encounter for fracture with routine healing: Secondary | ICD-10-CM

## 2021-12-30 DIAGNOSIS — N401 Enlarged prostate with lower urinary tract symptoms: Secondary | ICD-10-CM

## 2021-12-30 DIAGNOSIS — K219 Gastro-esophageal reflux disease without esophagitis: Secondary | ICD-10-CM

## 2021-12-30 DIAGNOSIS — I25118 Atherosclerotic heart disease of native coronary artery with other forms of angina pectoris: Secondary | ICD-10-CM

## 2021-12-30 DIAGNOSIS — E119 Type 2 diabetes mellitus without complications: Secondary | ICD-10-CM

## 2021-12-30 DIAGNOSIS — R351 Nocturia: Secondary | ICD-10-CM

## 2021-12-30 DIAGNOSIS — I10 Essential (primary) hypertension: Principal | ICD-10-CM

## 2021-12-30 DIAGNOSIS — G629 Polyneuropathy, unspecified: Secondary | ICD-10-CM

## 2021-12-30 DIAGNOSIS — Z9189 Other specified personal risk factors, not elsewhere classified: Secondary | ICD-10-CM

## 2021-12-30 DIAGNOSIS — N400 Enlarged prostate without lower urinary tract symptoms: Secondary | ICD-10-CM

## 2021-12-30 DIAGNOSIS — R2681 Unsteadiness on feet: Secondary | ICD-10-CM

## 2021-12-30 DIAGNOSIS — M542 Cervicalgia: Secondary | ICD-10-CM

## 2021-12-30 DIAGNOSIS — Z794 Long term (current) use of insulin: Secondary | ICD-10-CM

## 2021-12-30 DIAGNOSIS — J302 Other seasonal allergic rhinitis: Principal | ICD-10-CM

## 2021-12-30 DIAGNOSIS — I739 Peripheral vascular disease, unspecified: Secondary | ICD-10-CM

## 2021-12-30 DIAGNOSIS — C4491 Basal cell carcinoma of skin, unspecified: Secondary | ICD-10-CM

## 2021-12-30 DIAGNOSIS — F419 Anxiety disorder, unspecified: Secondary | ICD-10-CM

## 2021-12-30 DIAGNOSIS — I219 Acute myocardial infarction, unspecified: Secondary | ICD-10-CM

## 2021-12-30 DIAGNOSIS — Z9109 Other allergy status, other than to drugs and biological substances: Secondary | ICD-10-CM

## 2021-12-30 DIAGNOSIS — Z951 Presence of aortocoronary bypass graft: Secondary | ICD-10-CM

## 2021-12-30 DIAGNOSIS — I2510 Atherosclerotic heart disease of native coronary artery without angina pectoris: Secondary | ICD-10-CM

## 2021-12-30 DIAGNOSIS — E1142 Type 2 diabetes mellitus with diabetic polyneuropathy: Secondary | ICD-10-CM

## 2021-12-30 DIAGNOSIS — M1990 Unspecified osteoarthritis, unspecified site: Secondary | ICD-10-CM

## 2021-12-30 DIAGNOSIS — C4492 Squamous cell carcinoma of skin, unspecified: Secondary | ICD-10-CM

## 2021-12-30 DIAGNOSIS — E1151 Type 2 diabetes mellitus with diabetic peripheral angiopathy without gangrene: Secondary | ICD-10-CM

## 2021-12-30 DIAGNOSIS — Z6826 Body mass index (BMI) 26.0-26.9, adult: Secondary | ICD-10-CM

## 2021-12-30 MED ORDER — FLUTICASONE PROPIONATE 50 MCG/ACT NA SUSP
3 refills | Status: CP
Start: 2021-12-30 — End: ?

## 2021-12-30 MED ORDER — GABAPENTIN 600 MG PO TABS
600 mg | Freq: Three times a day (TID) | ORAL | 0 refills | 30.00 days | Status: CP
Start: 2021-12-30 — End: ?

## 2021-12-30 MED ORDER — ATORVASTATIN CALCIUM 40 MG PO TABS
40 mg | Freq: Every day | ORAL | 3 refills | 90.00 days | Status: CP
Start: 2021-12-30 — End: ?

## 2021-12-30 MED ORDER — HYDROCODONE-ACETAMINOPHEN 5-325 MG PO TABS
1 | ORAL_TABLET | Freq: Four times a day (QID) | ORAL | 0 refills | Status: CP | PRN
Start: 2021-12-30 — End: ?

## 2021-12-30 MED ORDER — TAMSULOSIN HCL 0.4 MG PO CAPS
.4 mg | Freq: Every day | ORAL | 3 refills | 30.00000 days | Status: CP
Start: 2021-12-30 — End: ?

## 2021-12-30 MED ORDER — CLOPIDOGREL BISULFATE 75 MG PO TABS
75 mg | Freq: Every day | ORAL | 3 refills | 90.00000 days | Status: CP
Start: 2021-12-30 — End: ?

## 2021-12-30 MED ORDER — HYDROCODONE-ACETAMINOPHEN 5-325 MG PO TABS
1 | ORAL_TABLET | Freq: Four times a day (QID) | ORAL | 0 refills | 30.00000 days | Status: CP | PRN
Start: 2021-12-30 — End: ?

## 2021-12-30 MED ORDER — METOPROLOL SUCCINATE ER 50 MG PO TB24
50 mg | Freq: Two times a day (BID) | ORAL | 3 refills | Status: CP
Start: 2021-12-30 — End: ?

## 2021-12-30 MED ORDER — LOSARTAN POTASSIUM 50 MG PO TABS
0 refills | 30.00 days | Status: CP
Start: 2021-12-30 — End: ?

## 2021-12-30 MED ORDER — AMLODIPINE BESYLATE 10 MG PO TABS
10 mg | Freq: Every day | ORAL | 3 refills | Status: CP
Start: 2021-12-30 — End: ?

## 2021-12-31 ENCOUNTER — Ambulatory Visit: Payer: Medicare Other | Attending: Physician Assistant | Primary: Family Medicine

## 2021-12-31 DIAGNOSIS — C439 Malignant melanoma of skin, unspecified: Secondary | ICD-10-CM

## 2021-12-31 DIAGNOSIS — I739 Peripheral vascular disease, unspecified: Secondary | ICD-10-CM

## 2021-12-31 DIAGNOSIS — C4491 Basal cell carcinoma of skin, unspecified: Secondary | ICD-10-CM

## 2021-12-31 DIAGNOSIS — C4492 Squamous cell carcinoma of skin, unspecified: Secondary | ICD-10-CM

## 2021-12-31 DIAGNOSIS — Z9109 Other allergy status, other than to drugs and biological substances: Secondary | ICD-10-CM

## 2021-12-31 DIAGNOSIS — N189 Chronic kidney disease, unspecified: Secondary | ICD-10-CM

## 2021-12-31 DIAGNOSIS — R42 Dizziness and giddiness: Secondary | ICD-10-CM

## 2021-12-31 DIAGNOSIS — M4802 Spinal stenosis, cervical region: Secondary | ICD-10-CM

## 2021-12-31 DIAGNOSIS — I10 Essential (primary) hypertension: Principal | ICD-10-CM

## 2021-12-31 DIAGNOSIS — E119 Type 2 diabetes mellitus without complications: Secondary | ICD-10-CM

## 2021-12-31 DIAGNOSIS — G629 Polyneuropathy, unspecified: Secondary | ICD-10-CM

## 2021-12-31 DIAGNOSIS — I219 Acute myocardial infarction, unspecified: Secondary | ICD-10-CM

## 2021-12-31 DIAGNOSIS — F419 Anxiety disorder, unspecified: Secondary | ICD-10-CM

## 2021-12-31 DIAGNOSIS — M542 Cervicalgia: Secondary | ICD-10-CM

## 2021-12-31 DIAGNOSIS — E785 Hyperlipidemia, unspecified: Secondary | ICD-10-CM

## 2021-12-31 DIAGNOSIS — K219 Gastro-esophageal reflux disease without esophagitis: Secondary | ICD-10-CM

## 2021-12-31 DIAGNOSIS — M47812 Spondylosis without myelopathy or radiculopathy, cervical region: Principal | ICD-10-CM

## 2021-12-31 DIAGNOSIS — Z9189 Other specified personal risk factors, not elsewhere classified: Secondary | ICD-10-CM

## 2021-12-31 DIAGNOSIS — I2510 Atherosclerotic heart disease of native coronary artery without angina pectoris: Secondary | ICD-10-CM

## 2021-12-31 DIAGNOSIS — M1990 Unspecified osteoarthritis, unspecified site: Secondary | ICD-10-CM

## 2021-12-31 DIAGNOSIS — N400 Enlarged prostate without lower urinary tract symptoms: Secondary | ICD-10-CM

## 2022-01-06 ENCOUNTER — Encounter: Payer: Medicare Other | Attending: Podiatrist | Primary: Family Medicine

## 2022-01-06 DIAGNOSIS — N189 Chronic kidney disease, unspecified: Secondary | ICD-10-CM

## 2022-01-06 DIAGNOSIS — C4491 Basal cell carcinoma of skin, unspecified: Secondary | ICD-10-CM

## 2022-01-06 DIAGNOSIS — 1 ERRONEOUS ENCOUNTER--DISREGARD: Principal | ICD-9-CM

## 2022-01-06 DIAGNOSIS — E119 Type 2 diabetes mellitus without complications: Secondary | ICD-10-CM

## 2022-01-06 DIAGNOSIS — I739 Peripheral vascular disease, unspecified: Secondary | ICD-10-CM

## 2022-01-06 DIAGNOSIS — I2510 Atherosclerotic heart disease of native coronary artery without angina pectoris: Secondary | ICD-10-CM

## 2022-01-06 DIAGNOSIS — M1990 Unspecified osteoarthritis, unspecified site: Secondary | ICD-10-CM

## 2022-01-06 DIAGNOSIS — M542 Cervicalgia: Secondary | ICD-10-CM

## 2022-01-06 DIAGNOSIS — Z9189 Other specified personal risk factors, not elsewhere classified: Secondary | ICD-10-CM

## 2022-01-06 DIAGNOSIS — E785 Hyperlipidemia, unspecified: Secondary | ICD-10-CM

## 2022-01-06 DIAGNOSIS — F419 Anxiety disorder, unspecified: Secondary | ICD-10-CM

## 2022-01-06 DIAGNOSIS — N400 Enlarged prostate without lower urinary tract symptoms: Secondary | ICD-10-CM

## 2022-01-06 DIAGNOSIS — G629 Polyneuropathy, unspecified: Secondary | ICD-10-CM

## 2022-01-06 DIAGNOSIS — C439 Malignant melanoma of skin, unspecified: Secondary | ICD-10-CM

## 2022-01-06 DIAGNOSIS — I219 Acute myocardial infarction, unspecified: Secondary | ICD-10-CM

## 2022-01-06 DIAGNOSIS — I10 Essential (primary) hypertension: Principal | ICD-10-CM

## 2022-01-06 DIAGNOSIS — C4492 Squamous cell carcinoma of skin, unspecified: Secondary | ICD-10-CM

## 2022-01-06 DIAGNOSIS — K219 Gastro-esophageal reflux disease without esophagitis: Secondary | ICD-10-CM

## 2022-01-06 DIAGNOSIS — Z9109 Other allergy status, other than to drugs and biological substances: Secondary | ICD-10-CM

## 2022-01-08 ENCOUNTER — Inpatient Hospital Stay: Admit: 2022-01-08 | Discharge: 2022-01-09 | Payer: Medicare Other | Primary: Family Medicine

## 2022-01-08 DIAGNOSIS — M47812 Spondylosis without myelopathy or radiculopathy, cervical region: Principal | ICD-10-CM

## 2022-01-08 DIAGNOSIS — M4802 Spinal stenosis, cervical region: Secondary | ICD-10-CM

## 2022-01-11 ENCOUNTER — Encounter
Payer: Medicare Other | Attending: Student in an Organized Health Care Education/Training Program | Primary: Family Medicine

## 2022-02-05 ENCOUNTER — Encounter: Payer: Medicare Other | Primary: Family Medicine

## 2022-02-05 DIAGNOSIS — E1151 Type 2 diabetes mellitus with diabetic peripheral angiopathy without gangrene: Principal | ICD-10-CM

## 2022-02-13 ENCOUNTER — Inpatient Hospital Stay: Admit: 2022-02-13 | Discharge: 2022-02-13 | Payer: Medicare Other

## 2022-02-13 DIAGNOSIS — N189 Chronic kidney disease, unspecified: Secondary | ICD-10-CM

## 2022-02-13 DIAGNOSIS — Z9109 Other allergy status, other than to drugs and biological substances: Secondary | ICD-10-CM

## 2022-02-13 DIAGNOSIS — C4491 Basal cell carcinoma of skin, unspecified: Secondary | ICD-10-CM

## 2022-02-13 DIAGNOSIS — C4492 Squamous cell carcinoma of skin, unspecified: Secondary | ICD-10-CM

## 2022-02-13 DIAGNOSIS — I10 Essential (primary) hypertension: Principal | ICD-10-CM

## 2022-02-13 DIAGNOSIS — E1140 Type 2 diabetes mellitus with diabetic neuropathy, unspecified: Secondary | ICD-10-CM

## 2022-02-13 DIAGNOSIS — M542 Cervicalgia: Secondary | ICD-10-CM

## 2022-02-13 DIAGNOSIS — L03116 Cellulitis of left lower limb: Principal | ICD-10-CM

## 2022-02-13 DIAGNOSIS — M79675 Pain in left toe(s): Secondary | ICD-10-CM

## 2022-02-13 DIAGNOSIS — I2510 Atherosclerotic heart disease of native coronary artery without angina pectoris: Secondary | ICD-10-CM

## 2022-02-13 DIAGNOSIS — Z9189 Other specified personal risk factors, not elsewhere classified: Secondary | ICD-10-CM

## 2022-02-13 DIAGNOSIS — I739 Peripheral vascular disease, unspecified: Secondary | ICD-10-CM

## 2022-02-13 DIAGNOSIS — E119 Type 2 diabetes mellitus without complications: Secondary | ICD-10-CM

## 2022-02-13 DIAGNOSIS — E785 Hyperlipidemia, unspecified: Secondary | ICD-10-CM

## 2022-02-13 DIAGNOSIS — I219 Acute myocardial infarction, unspecified: Secondary | ICD-10-CM

## 2022-02-13 DIAGNOSIS — G629 Polyneuropathy, unspecified: Secondary | ICD-10-CM

## 2022-02-13 DIAGNOSIS — M1990 Unspecified osteoarthritis, unspecified site: Secondary | ICD-10-CM

## 2022-02-13 DIAGNOSIS — F419 Anxiety disorder, unspecified: Secondary | ICD-10-CM

## 2022-02-13 DIAGNOSIS — C439 Malignant melanoma of skin, unspecified: Secondary | ICD-10-CM

## 2022-02-13 DIAGNOSIS — K219 Gastro-esophageal reflux disease without esophagitis: Secondary | ICD-10-CM

## 2022-02-13 DIAGNOSIS — N400 Enlarged prostate without lower urinary tract symptoms: Secondary | ICD-10-CM

## 2022-02-13 MED ORDER — SULFAMETHOXAZOLE-TRIMETHOPRIM 800-160 MG PO TABS
1 | ORAL_TABLET | Freq: Two times a day (BID) | ORAL | 0 refills | Status: CP
Start: 2022-02-13 — End: ?

## 2022-02-13 MED ORDER — CEPHALEXIN 500 MG PO CAPS
500 mg | Freq: Four times a day (QID) | ORAL | 0 refills | 7.00000 days | Status: CP
Start: 2022-02-13 — End: ?

## 2022-02-13 MED ORDER — SULFAMETHOXAZOLE-TRIMETHOPRIM 800-160 MG PO TABS
1 | ORAL_TABLET | Freq: Once | ORAL | Status: CP
Start: 2022-02-13 — End: ?

## 2022-02-13 MED ORDER — CEPHALEXIN 500 MG PO CAPS
500 mg | Freq: Once | ORAL | Status: CP
Start: 2022-02-13 — End: ?

## 2022-02-18 ENCOUNTER — Ambulatory Visit
Payer: Medicare Other | Attending: Student in an Organized Health Care Education/Training Program | Primary: Family Medicine

## 2022-02-18 DIAGNOSIS — N189 Chronic kidney disease, unspecified: Secondary | ICD-10-CM

## 2022-02-18 DIAGNOSIS — E559 Vitamin D deficiency, unspecified: Secondary | ICD-10-CM

## 2022-02-18 DIAGNOSIS — E875 Hyperkalemia: Secondary | ICD-10-CM

## 2022-02-18 DIAGNOSIS — F419 Anxiety disorder, unspecified: Secondary | ICD-10-CM

## 2022-02-18 DIAGNOSIS — I10 Essential (primary) hypertension: Secondary | ICD-10-CM

## 2022-02-18 DIAGNOSIS — Z9189 Other specified personal risk factors, not elsewhere classified: Secondary | ICD-10-CM

## 2022-02-18 DIAGNOSIS — I129 Hypertensive chronic kidney disease with stage 1 through stage 4 chronic kidney disease, or unspecified chronic kidney disease: Secondary | ICD-10-CM

## 2022-02-18 DIAGNOSIS — E785 Hyperlipidemia, unspecified: Secondary | ICD-10-CM

## 2022-02-18 DIAGNOSIS — M1990 Unspecified osteoarthritis, unspecified site: Secondary | ICD-10-CM

## 2022-02-18 DIAGNOSIS — E119 Type 2 diabetes mellitus without complications: Secondary | ICD-10-CM

## 2022-02-18 DIAGNOSIS — Z9109 Other allergy status, other than to drugs and biological substances: Secondary | ICD-10-CM

## 2022-02-18 DIAGNOSIS — N1832 Stage 3b chronic kidney disease (CMS-HCC: 138): Principal | ICD-10-CM

## 2022-02-18 DIAGNOSIS — M542 Cervicalgia: Secondary | ICD-10-CM

## 2022-02-18 DIAGNOSIS — N400 Enlarged prostate without lower urinary tract symptoms: Secondary | ICD-10-CM

## 2022-02-18 DIAGNOSIS — I219 Acute myocardial infarction, unspecified: Secondary | ICD-10-CM

## 2022-02-18 DIAGNOSIS — D631 Anemia in chronic kidney disease: Secondary | ICD-10-CM

## 2022-02-18 DIAGNOSIS — I2510 Atherosclerotic heart disease of native coronary artery without angina pectoris: Secondary | ICD-10-CM

## 2022-02-18 DIAGNOSIS — N183 Benign hypertension with CKD (chronic kidney disease) stage III (CMS-HCC: 138): Secondary | ICD-10-CM

## 2022-02-18 DIAGNOSIS — C439 Malignant melanoma of skin, unspecified: Secondary | ICD-10-CM

## 2022-02-18 DIAGNOSIS — K219 Gastro-esophageal reflux disease without esophagitis: Secondary | ICD-10-CM

## 2022-02-18 DIAGNOSIS — G629 Polyneuropathy, unspecified: Secondary | ICD-10-CM

## 2022-02-18 DIAGNOSIS — C4492 Squamous cell carcinoma of skin, unspecified: Secondary | ICD-10-CM

## 2022-02-18 DIAGNOSIS — C4491 Basal cell carcinoma of skin, unspecified: Secondary | ICD-10-CM

## 2022-02-18 DIAGNOSIS — I739 Peripheral vascular disease, unspecified: Secondary | ICD-10-CM

## 2022-02-18 DIAGNOSIS — E1121 Type 2 diabetes mellitus with diabetic nephropathy: Secondary | ICD-10-CM

## 2022-03-03 ENCOUNTER — Ambulatory Visit: Payer: Medicare Other | Attending: Family Medicine | Primary: Family Medicine

## 2022-03-03 DIAGNOSIS — I739 Peripheral vascular disease, unspecified: Secondary | ICD-10-CM

## 2022-03-03 DIAGNOSIS — M545 Chronic bilateral low back pain without sciatica: Secondary | ICD-10-CM

## 2022-03-03 DIAGNOSIS — M170 Bilateral primary osteoarthritis of knee: Secondary | ICD-10-CM

## 2022-03-03 DIAGNOSIS — C4492 Squamous cell carcinoma of skin, unspecified: Secondary | ICD-10-CM

## 2022-03-03 DIAGNOSIS — C439 Malignant melanoma of skin, unspecified: Secondary | ICD-10-CM

## 2022-03-03 DIAGNOSIS — Z9189 Other specified personal risk factors, not elsewhere classified: Secondary | ICD-10-CM

## 2022-03-03 DIAGNOSIS — N400 Enlarged prostate without lower urinary tract symptoms: Secondary | ICD-10-CM

## 2022-03-03 DIAGNOSIS — I2510 Atherosclerotic heart disease of native coronary artery without angina pectoris: Secondary | ICD-10-CM

## 2022-03-03 DIAGNOSIS — I219 Acute myocardial infarction, unspecified: Secondary | ICD-10-CM

## 2022-03-03 DIAGNOSIS — I25810 Atherosclerosis of coronary artery bypass graft(s) without angina pectoris: Secondary | ICD-10-CM

## 2022-03-03 DIAGNOSIS — E119 Type 2 diabetes mellitus without complications: Secondary | ICD-10-CM

## 2022-03-03 DIAGNOSIS — F419 Anxiety disorder, unspecified: Secondary | ICD-10-CM

## 2022-03-03 DIAGNOSIS — Z951 Presence of aortocoronary bypass graft: Secondary | ICD-10-CM

## 2022-03-03 DIAGNOSIS — M542 Cervicalgia: Secondary | ICD-10-CM

## 2022-03-03 DIAGNOSIS — Z6825 Body mass index (BMI) 25.0-25.9, adult: Principal | ICD-10-CM

## 2022-03-03 DIAGNOSIS — Z981 Arthrodesis status: Secondary | ICD-10-CM

## 2022-03-03 DIAGNOSIS — N189 Chronic kidney disease, unspecified: Secondary | ICD-10-CM

## 2022-03-03 DIAGNOSIS — N1831 Stage 3a chronic kidney disease (CMS-HCC: 138): Secondary | ICD-10-CM

## 2022-03-03 DIAGNOSIS — Z9109 Other allergy status, other than to drugs and biological substances: Secondary | ICD-10-CM

## 2022-03-03 DIAGNOSIS — M1990 Unspecified osteoarthritis, unspecified site: Secondary | ICD-10-CM

## 2022-03-03 DIAGNOSIS — G8929 Other chronic pain: Secondary | ICD-10-CM

## 2022-03-03 DIAGNOSIS — G629 Polyneuropathy, unspecified: Secondary | ICD-10-CM

## 2022-03-03 DIAGNOSIS — I10 Essential (primary) hypertension: Principal | ICD-10-CM

## 2022-03-03 DIAGNOSIS — C4491 Basal cell carcinoma of skin, unspecified: Secondary | ICD-10-CM

## 2022-03-03 DIAGNOSIS — Z7409 Other reduced mobility: Secondary | ICD-10-CM

## 2022-03-03 DIAGNOSIS — S3289XD Fracture of other parts of pelvis, subsequent encounter for fracture with routine healing: Secondary | ICD-10-CM

## 2022-03-03 DIAGNOSIS — J300 Vasomotor rhinitis: Secondary | ICD-10-CM

## 2022-03-03 DIAGNOSIS — K219 Gastro-esophageal reflux disease without esophagitis: Secondary | ICD-10-CM

## 2022-03-03 DIAGNOSIS — E785 Hyperlipidemia, unspecified: Secondary | ICD-10-CM

## 2022-03-03 DIAGNOSIS — R0609 Other forms of dyspnea: Secondary | ICD-10-CM

## 2022-03-03 DIAGNOSIS — E1151 Type 2 diabetes mellitus with diabetic peripheral angiopathy without gangrene: Secondary | ICD-10-CM

## 2022-03-03 MED ORDER — HYDROCODONE-ACETAMINOPHEN 5-325 MG PO TABS
1 | ORAL_TABLET | Freq: Four times a day (QID) | ORAL | 0 refills | 30.00000 days | Status: CP | PRN
Start: 2022-03-03 — End: ?

## 2022-03-03 MED ORDER — ATROVENT HFA 17 MCG/ACT IN AERS
2 | Freq: Three times a day (TID) | RESPIRATORY_TRACT | Status: CN
Start: 2022-03-03 — End: ?

## 2022-03-03 MED ORDER — DIAZEPAM 5 MG PO TABS: Start: 2022-03-03 — End: ?

## 2022-03-03 MED ORDER — IPRATROPIUM BROMIDE 0.06 % NA SOLN
2 | Freq: Four times a day (QID) | NASAL | 1 refills | 15.50 days | Status: CP
Start: 2022-03-03 — End: ?

## 2022-03-03 MED ORDER — DIAZEPAM 5 MG PO TABS
5 mg | Freq: Four times a day (QID) | ORAL | Status: CN | PRN
Start: 2022-03-03 — End: ?

## 2022-03-09 ENCOUNTER — Encounter
Payer: Medicare Other | Attending: Student in an Organized Health Care Education/Training Program | Primary: Family Medicine

## 2022-03-09 ENCOUNTER — Ambulatory Visit: Payer: Medicare Other | Attending: "Endocrinology | Primary: Family Medicine

## 2022-03-09 DIAGNOSIS — Z794 Long term (current) use of insulin: Secondary | ICD-10-CM

## 2022-03-09 DIAGNOSIS — K219 Gastro-esophageal reflux disease without esophagitis: Secondary | ICD-10-CM

## 2022-03-09 DIAGNOSIS — E7849 Other hyperlipidemia: Secondary | ICD-10-CM

## 2022-03-09 DIAGNOSIS — I10 Essential (primary) hypertension: Principal | ICD-10-CM

## 2022-03-09 DIAGNOSIS — I219 Acute myocardial infarction, unspecified: Secondary | ICD-10-CM

## 2022-03-09 DIAGNOSIS — C4492 Squamous cell carcinoma of skin, unspecified: Secondary | ICD-10-CM

## 2022-03-09 DIAGNOSIS — C439 Malignant melanoma of skin, unspecified: Secondary | ICD-10-CM

## 2022-03-09 DIAGNOSIS — Z9189 Other specified personal risk factors, not elsewhere classified: Secondary | ICD-10-CM

## 2022-03-09 DIAGNOSIS — N400 Enlarged prostate without lower urinary tract symptoms: Secondary | ICD-10-CM

## 2022-03-09 DIAGNOSIS — E1142 Type 2 diabetes mellitus with diabetic polyneuropathy: Principal | ICD-10-CM

## 2022-03-09 DIAGNOSIS — E785 Hyperlipidemia, unspecified: Secondary | ICD-10-CM

## 2022-03-09 DIAGNOSIS — I739 Peripheral vascular disease, unspecified: Secondary | ICD-10-CM

## 2022-03-09 DIAGNOSIS — N1832 Stage 3b chronic kidney disease (CMS-HCC: 138): Secondary | ICD-10-CM

## 2022-03-09 DIAGNOSIS — G629 Polyneuropathy, unspecified: Secondary | ICD-10-CM

## 2022-03-09 DIAGNOSIS — Z9109 Other allergy status, other than to drugs and biological substances: Secondary | ICD-10-CM

## 2022-03-09 DIAGNOSIS — C4491 Basal cell carcinoma of skin, unspecified: Secondary | ICD-10-CM

## 2022-03-09 DIAGNOSIS — I2510 Atherosclerotic heart disease of native coronary artery without angina pectoris: Secondary | ICD-10-CM

## 2022-03-09 DIAGNOSIS — M1990 Unspecified osteoarthritis, unspecified site: Secondary | ICD-10-CM

## 2022-03-09 DIAGNOSIS — N189 Chronic kidney disease, unspecified: Secondary | ICD-10-CM

## 2022-03-09 DIAGNOSIS — F419 Anxiety disorder, unspecified: Secondary | ICD-10-CM

## 2022-03-09 DIAGNOSIS — E119 Type 2 diabetes mellitus without complications: Secondary | ICD-10-CM

## 2022-03-09 DIAGNOSIS — M542 Cervicalgia: Secondary | ICD-10-CM

## 2022-03-09 DIAGNOSIS — R42 Dizziness and giddiness: Principal | ICD-10-CM

## 2022-03-17 ENCOUNTER — Ambulatory Visit
Payer: Medicare Other | Attending: Student in an Organized Health Care Education/Training Program | Primary: Family Medicine

## 2022-03-17 DIAGNOSIS — R251 Tremor, unspecified: Principal | ICD-10-CM

## 2022-03-25 ENCOUNTER — Ambulatory Visit
Payer: Medicare Other | Attending: Student in an Organized Health Care Education/Training Program | Primary: Family Medicine

## 2022-03-25 DIAGNOSIS — R1310 Dysphagia, unspecified: Secondary | ICD-10-CM

## 2022-03-25 DIAGNOSIS — M1990 Unspecified osteoarthritis, unspecified site: Secondary | ICD-10-CM

## 2022-03-25 DIAGNOSIS — N189 Chronic kidney disease, unspecified: Secondary | ICD-10-CM

## 2022-03-25 DIAGNOSIS — E785 Hyperlipidemia, unspecified: Secondary | ICD-10-CM

## 2022-03-25 DIAGNOSIS — Z9189 Other specified personal risk factors, not elsewhere classified: Secondary | ICD-10-CM

## 2022-03-25 DIAGNOSIS — K219 Gastro-esophageal reflux disease without esophagitis: Secondary | ICD-10-CM

## 2022-03-25 DIAGNOSIS — I2510 Atherosclerotic heart disease of native coronary artery without angina pectoris: Secondary | ICD-10-CM

## 2022-03-25 DIAGNOSIS — G629 Polyneuropathy, unspecified: Secondary | ICD-10-CM

## 2022-03-25 DIAGNOSIS — C4492 Squamous cell carcinoma of skin, unspecified: Secondary | ICD-10-CM

## 2022-03-25 DIAGNOSIS — F419 Anxiety disorder, unspecified: Secondary | ICD-10-CM

## 2022-03-25 DIAGNOSIS — C439 Malignant melanoma of skin, unspecified: Secondary | ICD-10-CM

## 2022-03-25 DIAGNOSIS — R490 Dysphonia: Secondary | ICD-10-CM

## 2022-03-25 DIAGNOSIS — N400 Enlarged prostate without lower urinary tract symptoms: Secondary | ICD-10-CM

## 2022-03-25 DIAGNOSIS — Z9109 Other allergy status, other than to drugs and biological substances: Secondary | ICD-10-CM

## 2022-03-25 DIAGNOSIS — I219 Acute myocardial infarction, unspecified: Secondary | ICD-10-CM

## 2022-03-25 DIAGNOSIS — R42 Dizziness and giddiness: Secondary | ICD-10-CM

## 2022-03-25 DIAGNOSIS — H903 Sensorineural hearing loss, bilateral: Principal | ICD-10-CM

## 2022-03-25 DIAGNOSIS — I739 Peripheral vascular disease, unspecified: Secondary | ICD-10-CM

## 2022-03-25 DIAGNOSIS — I10 Essential (primary) hypertension: Principal | ICD-10-CM

## 2022-03-25 DIAGNOSIS — M542 Cervicalgia: Secondary | ICD-10-CM

## 2022-03-25 DIAGNOSIS — C4491 Basal cell carcinoma of skin, unspecified: Secondary | ICD-10-CM

## 2022-03-25 DIAGNOSIS — E119 Type 2 diabetes mellitus without complications: Secondary | ICD-10-CM

## 2022-04-05 ENCOUNTER — Ambulatory Visit: Payer: Medicare Other | Attending: Otolaryngology | Primary: Family Medicine

## 2022-04-05 DIAGNOSIS — C4492 Squamous cell carcinoma of skin, unspecified: Secondary | ICD-10-CM

## 2022-04-05 DIAGNOSIS — M542 Cervicalgia: Secondary | ICD-10-CM

## 2022-04-05 DIAGNOSIS — E785 Hyperlipidemia, unspecified: Secondary | ICD-10-CM

## 2022-04-05 DIAGNOSIS — I2510 Atherosclerotic heart disease of native coronary artery without angina pectoris: Secondary | ICD-10-CM

## 2022-04-05 DIAGNOSIS — I10 Essential (primary) hypertension: Principal | ICD-10-CM

## 2022-04-05 DIAGNOSIS — N189 Chronic kidney disease, unspecified: Secondary | ICD-10-CM

## 2022-04-05 DIAGNOSIS — M1990 Unspecified osteoarthritis, unspecified site: Secondary | ICD-10-CM

## 2022-04-05 DIAGNOSIS — F419 Anxiety disorder, unspecified: Secondary | ICD-10-CM

## 2022-04-05 DIAGNOSIS — E119 Type 2 diabetes mellitus without complications: Secondary | ICD-10-CM

## 2022-04-05 DIAGNOSIS — R490 Dysphonia: Secondary | ICD-10-CM

## 2022-04-05 DIAGNOSIS — R1312 Dysphagia, oropharyngeal phase: Secondary | ICD-10-CM

## 2022-04-05 DIAGNOSIS — I219 Acute myocardial infarction, unspecified: Secondary | ICD-10-CM

## 2022-04-05 DIAGNOSIS — J383 Other diseases of vocal cords: Principal | ICD-10-CM

## 2022-04-05 DIAGNOSIS — C4491 Basal cell carcinoma of skin, unspecified: Secondary | ICD-10-CM

## 2022-04-05 DIAGNOSIS — C439 Malignant melanoma of skin, unspecified: Secondary | ICD-10-CM

## 2022-04-05 DIAGNOSIS — Z9189 Other specified personal risk factors, not elsewhere classified: Secondary | ICD-10-CM

## 2022-04-05 DIAGNOSIS — N400 Enlarged prostate without lower urinary tract symptoms: Secondary | ICD-10-CM

## 2022-04-05 DIAGNOSIS — I739 Peripheral vascular disease, unspecified: Secondary | ICD-10-CM

## 2022-04-05 DIAGNOSIS — G629 Polyneuropathy, unspecified: Secondary | ICD-10-CM

## 2022-04-05 DIAGNOSIS — Z9109 Other allergy status, other than to drugs and biological substances: Secondary | ICD-10-CM

## 2022-04-05 DIAGNOSIS — K219 Gastro-esophageal reflux disease without esophagitis: Secondary | ICD-10-CM

## 2022-04-27 ENCOUNTER — Ambulatory Visit: Payer: Medicare Other | Attending: Internal Medicine | Primary: Family Medicine

## 2022-04-27 DIAGNOSIS — I639 Cerebral infarction, unspecified: Principal | ICD-10-CM

## 2022-04-27 DIAGNOSIS — M5481 Occipital neuralgia: Secondary | ICD-10-CM

## 2022-05-05 ENCOUNTER — Ambulatory Visit: Payer: Medicare Other | Attending: Medical | Primary: Family Medicine

## 2022-05-05 DIAGNOSIS — C4491 Basal cell carcinoma of skin, unspecified: Secondary | ICD-10-CM

## 2022-05-05 DIAGNOSIS — I219 Acute myocardial infarction, unspecified: Secondary | ICD-10-CM

## 2022-05-05 DIAGNOSIS — M542 Cervicalgia: Secondary | ICD-10-CM

## 2022-05-05 DIAGNOSIS — C439 Malignant melanoma of skin, unspecified: Secondary | ICD-10-CM

## 2022-05-05 DIAGNOSIS — N400 Enlarged prostate without lower urinary tract symptoms: Secondary | ICD-10-CM

## 2022-05-05 DIAGNOSIS — F419 Anxiety disorder, unspecified: Secondary | ICD-10-CM

## 2022-05-05 DIAGNOSIS — S3289XD Fracture of other parts of pelvis, subsequent encounter for fracture with routine healing: Secondary | ICD-10-CM

## 2022-05-05 DIAGNOSIS — Z9189 Other specified personal risk factors, not elsewhere classified: Secondary | ICD-10-CM

## 2022-05-05 DIAGNOSIS — E119 Type 2 diabetes mellitus without complications: Secondary | ICD-10-CM

## 2022-05-05 DIAGNOSIS — G629 Polyneuropathy, unspecified: Secondary | ICD-10-CM

## 2022-05-05 DIAGNOSIS — I10 Essential (primary) hypertension: Principal | ICD-10-CM

## 2022-05-05 DIAGNOSIS — M170 Bilateral primary osteoarthritis of knee: Secondary | ICD-10-CM

## 2022-05-05 DIAGNOSIS — I739 Peripheral vascular disease, unspecified: Secondary | ICD-10-CM

## 2022-05-05 DIAGNOSIS — Z9109 Other allergy status, other than to drugs and biological substances: Secondary | ICD-10-CM

## 2022-05-05 DIAGNOSIS — K219 Gastro-esophageal reflux disease without esophagitis: Secondary | ICD-10-CM

## 2022-05-05 DIAGNOSIS — C4492 Squamous cell carcinoma of skin, unspecified: Secondary | ICD-10-CM

## 2022-05-05 DIAGNOSIS — N189 Chronic kidney disease, unspecified: Secondary | ICD-10-CM

## 2022-05-05 DIAGNOSIS — Z6825 Body mass index (BMI) 25.0-25.9, adult: Principal | ICD-10-CM

## 2022-05-05 DIAGNOSIS — M1990 Unspecified osteoarthritis, unspecified site: Secondary | ICD-10-CM

## 2022-05-05 DIAGNOSIS — I2510 Atherosclerotic heart disease of native coronary artery without angina pectoris: Secondary | ICD-10-CM

## 2022-05-05 DIAGNOSIS — E785 Hyperlipidemia, unspecified: Secondary | ICD-10-CM

## 2022-05-05 MED ORDER — HYDROCODONE-ACETAMINOPHEN 5-325 MG PO TABS
1 | ORAL_TABLET | Freq: Four times a day (QID) | ORAL | 0 refills | 30.00000 days | Status: CP | PRN
Start: 2022-05-05 — End: ?

## 2022-05-06 DIAGNOSIS — I10 Essential (primary) hypertension: Principal | ICD-10-CM

## 2022-05-06 MED ORDER — LOSARTAN POTASSIUM 50 MG PO TABS
0 refills | Status: CP
Start: 2022-05-06 — End: ?

## 2022-05-10 ENCOUNTER — Encounter: Primary: Family Medicine

## 2022-05-21 DIAGNOSIS — E1121 Type 2 diabetes mellitus with diabetic nephropathy: Secondary | ICD-10-CM

## 2022-05-21 DIAGNOSIS — N1832 Stage 3b chronic kidney disease (CMS-HCC: 138): Principal | ICD-10-CM

## 2022-05-21 DIAGNOSIS — E875 Hyperkalemia: Secondary | ICD-10-CM

## 2022-05-21 DIAGNOSIS — E559 Vitamin D deficiency, unspecified: Secondary | ICD-10-CM

## 2022-05-24 ENCOUNTER — Encounter
Payer: Medicare Other | Attending: Student in an Organized Health Care Education/Training Program | Primary: Family Medicine

## 2022-05-28 ENCOUNTER — Inpatient Hospital Stay: Admit: 2022-05-28 | Discharge: 2022-05-29 | Payer: Medicare Other | Primary: Family Medicine

## 2022-05-28 DIAGNOSIS — I639 Cerebral infarction, unspecified: Principal | ICD-10-CM

## 2022-05-31 ENCOUNTER — Ambulatory Visit
Admit: 2022-05-31 | Discharge: 2022-06-01 | Payer: Medicare Other | Attending: Student in an Organized Health Care Education/Training Program | Primary: Family Medicine

## 2022-05-31 DIAGNOSIS — N400 Enlarged prostate without lower urinary tract symptoms: Secondary | ICD-10-CM

## 2022-05-31 DIAGNOSIS — I739 Peripheral vascular disease, unspecified: Secondary | ICD-10-CM

## 2022-05-31 DIAGNOSIS — Z899 Acquired absence of limb, unspecified: Secondary | ICD-10-CM

## 2022-05-31 DIAGNOSIS — F419 Anxiety disorder, unspecified: Secondary | ICD-10-CM

## 2022-05-31 DIAGNOSIS — G629 Polyneuropathy, unspecified: Secondary | ICD-10-CM

## 2022-05-31 DIAGNOSIS — E119 Type 2 diabetes mellitus without complications: Secondary | ICD-10-CM

## 2022-05-31 DIAGNOSIS — K219 Gastro-esophageal reflux disease without esophagitis: Secondary | ICD-10-CM

## 2022-05-31 DIAGNOSIS — Z9189 Other specified personal risk factors, not elsewhere classified: Secondary | ICD-10-CM

## 2022-05-31 DIAGNOSIS — C4492 Squamous cell carcinoma of skin, unspecified: Secondary | ICD-10-CM

## 2022-05-31 DIAGNOSIS — I2510 Atherosclerotic heart disease of native coronary artery without angina pectoris: Secondary | ICD-10-CM

## 2022-05-31 DIAGNOSIS — C439 Malignant melanoma of skin, unspecified: Secondary | ICD-10-CM

## 2022-05-31 DIAGNOSIS — M542 Cervicalgia: Secondary | ICD-10-CM

## 2022-05-31 DIAGNOSIS — N189 Chronic kidney disease, unspecified: Secondary | ICD-10-CM

## 2022-05-31 DIAGNOSIS — E785 Hyperlipidemia, unspecified: Secondary | ICD-10-CM

## 2022-05-31 DIAGNOSIS — Z9109 Other allergy status, other than to drugs and biological substances: Secondary | ICD-10-CM

## 2022-05-31 DIAGNOSIS — C4491 Basal cell carcinoma of skin, unspecified: Secondary | ICD-10-CM

## 2022-05-31 DIAGNOSIS — E1142 Type 2 diabetes mellitus with diabetic polyneuropathy: Principal | ICD-10-CM

## 2022-05-31 DIAGNOSIS — I10 Essential (primary) hypertension: Principal | ICD-10-CM

## 2022-05-31 DIAGNOSIS — M1990 Unspecified osteoarthritis, unspecified site: Secondary | ICD-10-CM

## 2022-05-31 DIAGNOSIS — I219 Acute myocardial infarction, unspecified: Secondary | ICD-10-CM

## 2022-05-31 DIAGNOSIS — M2041 Other hammer toe(s) (acquired), right foot: Secondary | ICD-10-CM

## 2022-06-01 ENCOUNTER — Ambulatory Visit: Payer: Medicare Other | Attending: Family Medicine | Primary: Family Medicine

## 2022-06-01 DIAGNOSIS — N189 Chronic kidney disease, unspecified: Secondary | ICD-10-CM

## 2022-06-01 DIAGNOSIS — C439 Malignant melanoma of skin, unspecified: Secondary | ICD-10-CM

## 2022-06-01 DIAGNOSIS — M1990 Unspecified osteoarthritis, unspecified site: Secondary | ICD-10-CM

## 2022-06-01 DIAGNOSIS — I739 Peripheral vascular disease, unspecified: Secondary | ICD-10-CM

## 2022-06-01 DIAGNOSIS — N400 Enlarged prostate without lower urinary tract symptoms: Secondary | ICD-10-CM

## 2022-06-01 DIAGNOSIS — Z9109 Other allergy status, other than to drugs and biological substances: Secondary | ICD-10-CM

## 2022-06-01 DIAGNOSIS — S3289XD Fracture of other parts of pelvis, subsequent encounter for fracture with routine healing: Secondary | ICD-10-CM

## 2022-06-01 DIAGNOSIS — Z9189 Other specified personal risk factors, not elsewhere classified: Secondary | ICD-10-CM

## 2022-06-01 DIAGNOSIS — G629 Polyneuropathy, unspecified: Secondary | ICD-10-CM

## 2022-06-01 DIAGNOSIS — E119 Type 2 diabetes mellitus without complications: Secondary | ICD-10-CM

## 2022-06-01 DIAGNOSIS — K219 Gastro-esophageal reflux disease without esophagitis: Secondary | ICD-10-CM

## 2022-06-01 DIAGNOSIS — C4492 Squamous cell carcinoma of skin, unspecified: Secondary | ICD-10-CM

## 2022-06-01 DIAGNOSIS — C4491 Basal cell carcinoma of skin, unspecified: Secondary | ICD-10-CM

## 2022-06-01 DIAGNOSIS — I10 Essential (primary) hypertension: Principal | ICD-10-CM

## 2022-06-01 DIAGNOSIS — M170 Bilateral primary osteoarthritis of knee: Principal | ICD-10-CM

## 2022-06-01 DIAGNOSIS — M542 Cervicalgia: Secondary | ICD-10-CM

## 2022-06-01 DIAGNOSIS — E785 Hyperlipidemia, unspecified: Secondary | ICD-10-CM

## 2022-06-01 DIAGNOSIS — F419 Anxiety disorder, unspecified: Secondary | ICD-10-CM

## 2022-06-01 DIAGNOSIS — I219 Acute myocardial infarction, unspecified: Secondary | ICD-10-CM

## 2022-06-01 DIAGNOSIS — I2510 Atherosclerotic heart disease of native coronary artery without angina pectoris: Secondary | ICD-10-CM

## 2022-06-01 MED ORDER — HYDROCODONE-ACETAMINOPHEN 5-325 MG PO TABS
1 | ORAL_TABLET | Freq: Four times a day (QID) | ORAL | 0 refills | 30.00000 days | Status: CP | PRN
Start: 2022-06-01 — End: ?

## 2022-06-02 DIAGNOSIS — R2689 Other abnormalities of gait and mobility: Secondary | ICD-10-CM

## 2022-06-02 DIAGNOSIS — S3289XD Fracture of other parts of pelvis, subsequent encounter for fracture with routine healing: Secondary | ICD-10-CM

## 2022-06-02 DIAGNOSIS — M4802 Spinal stenosis, cervical region: Principal | ICD-10-CM

## 2022-06-02 DIAGNOSIS — S32511S Fracture of superior rim of right pubis, sequela: Secondary | ICD-10-CM

## 2022-06-02 DIAGNOSIS — R296 Repeated falls: Secondary | ICD-10-CM

## 2022-06-02 DIAGNOSIS — M170 Bilateral primary osteoarthritis of knee: Secondary | ICD-10-CM

## 2022-06-02 DIAGNOSIS — I5189 Other ill-defined heart diseases: Secondary | ICD-10-CM

## 2022-06-02 DIAGNOSIS — W11XXXA Fall on and from ladder, initial encounter: Secondary | ICD-10-CM

## 2022-06-02 DIAGNOSIS — M67911 Unspecified disorder of synovium and tendon, right shoulder: Secondary | ICD-10-CM

## 2022-06-02 DIAGNOSIS — Z981 Arthrodesis status: Secondary | ICD-10-CM

## 2022-06-03 ENCOUNTER — Encounter: Payer: Medicare Other | Attending: "Endocrinology | Primary: Family Medicine

## 2022-06-30 ENCOUNTER — Encounter: Payer: Medicare Other | Attending: Family Medicine | Primary: Family Medicine

## 2022-07-13 ENCOUNTER — Ambulatory Visit: Payer: Medicare Other | Attending: Family Medicine | Primary: Family Medicine

## 2022-07-13 DIAGNOSIS — M170 Bilateral primary osteoarthritis of knee: Secondary | ICD-10-CM

## 2022-07-13 DIAGNOSIS — E1151 Type 2 diabetes mellitus with diabetic peripheral angiopathy without gangrene: Secondary | ICD-10-CM

## 2022-07-13 DIAGNOSIS — I2510 Atherosclerotic heart disease of native coronary artery without angina pectoris: Secondary | ICD-10-CM

## 2022-07-13 DIAGNOSIS — Z9109 Other allergy status, other than to drugs and biological substances: Secondary | ICD-10-CM

## 2022-07-13 DIAGNOSIS — M542 Cervicalgia: Secondary | ICD-10-CM

## 2022-07-13 DIAGNOSIS — C4492 Squamous cell carcinoma of skin, unspecified: Secondary | ICD-10-CM

## 2022-07-13 DIAGNOSIS — C439 Malignant melanoma of skin, unspecified: Secondary | ICD-10-CM

## 2022-07-13 DIAGNOSIS — M1990 Unspecified osteoarthritis, unspecified site: Secondary | ICD-10-CM

## 2022-07-13 DIAGNOSIS — F419 Anxiety disorder, unspecified: Secondary | ICD-10-CM

## 2022-07-13 DIAGNOSIS — E7849 Other hyperlipidemia: Secondary | ICD-10-CM

## 2022-07-13 DIAGNOSIS — K219 Gastro-esophageal reflux disease without esophagitis: Secondary | ICD-10-CM

## 2022-07-13 DIAGNOSIS — G629 Polyneuropathy, unspecified: Secondary | ICD-10-CM

## 2022-07-13 DIAGNOSIS — I10 Essential (primary) hypertension: Secondary | ICD-10-CM

## 2022-07-13 DIAGNOSIS — Z794 Long term (current) use of insulin: Secondary | ICD-10-CM

## 2022-07-13 DIAGNOSIS — E119 Type 2 diabetes mellitus without complications: Secondary | ICD-10-CM

## 2022-07-13 DIAGNOSIS — C4491 Basal cell carcinoma of skin, unspecified: Secondary | ICD-10-CM

## 2022-07-13 DIAGNOSIS — N189 Chronic kidney disease, unspecified: Secondary | ICD-10-CM

## 2022-07-13 DIAGNOSIS — I219 Acute myocardial infarction, unspecified: Secondary | ICD-10-CM

## 2022-07-13 DIAGNOSIS — E785 Hyperlipidemia, unspecified: Secondary | ICD-10-CM

## 2022-07-13 DIAGNOSIS — Z9189 Other specified personal risk factors, not elsewhere classified: Secondary | ICD-10-CM

## 2022-07-13 DIAGNOSIS — N400 Enlarged prostate without lower urinary tract symptoms: Secondary | ICD-10-CM

## 2022-07-13 DIAGNOSIS — S3289XD Fracture of other parts of pelvis, subsequent encounter for fracture with routine healing: Secondary | ICD-10-CM

## 2022-07-13 DIAGNOSIS — E1142 Type 2 diabetes mellitus with diabetic polyneuropathy: Secondary | ICD-10-CM

## 2022-07-13 DIAGNOSIS — I739 Peripheral vascular disease, unspecified: Secondary | ICD-10-CM

## 2022-07-13 DIAGNOSIS — N1832 Stage 3b chronic kidney disease (CMS-HCC: 138): Secondary | ICD-10-CM

## 2022-07-13 DIAGNOSIS — Z6826 Body mass index (BMI) 26.0-26.9, adult: Principal | ICD-10-CM

## 2022-07-13 MED ORDER — LANCETS MISC
0 refills | Status: CP
Start: 2022-07-13 — End: ?

## 2022-07-13 MED ORDER — FREESTYLE LITE DEVI
0 refills | Status: CP
Start: 2022-07-13 — End: ?

## 2022-07-13 MED ORDER — ACCU-CHEK GUIDE W/DEVICE KIT
0 refills | Status: CP
Start: 2022-07-13 — End: ?

## 2022-07-13 MED ORDER — HYDROCODONE-ACETAMINOPHEN 5-325 MG PO TABS
1 | ORAL_TABLET | Freq: Four times a day (QID) | ORAL | 0 refills | 30.00000 days | Status: CP | PRN
Start: 2022-07-13 — End: ?

## 2022-07-13 MED ORDER — BLOOD GLUCOSE TEST VI STRP
ORAL_STRIP | 0 refills | Status: CP
Start: 2022-07-13 — End: ?

## 2022-07-13 MED ORDER — LOSARTAN POTASSIUM 50 MG PO TABS
0 refills | Status: CP
Start: 2022-07-13 — End: ?

## 2022-07-14 ENCOUNTER — Encounter: Payer: Medicare Other | Attending: Family Medicine | Primary: Family Medicine

## 2022-07-22 MED ORDER — ACCU-CHEK SOFTCLIX LANCETS MISC
0 refills | Status: CP
Start: 2022-07-22 — End: ?

## 2022-07-22 MED ORDER — FREESTYLE LITE W/DEVICE KIT
PACK | 0 refills | Status: CP
Start: 2022-07-22 — End: ?

## 2022-07-28 DIAGNOSIS — N1832 Stage 3b chronic kidney disease (CMS-HCC: 138): Secondary | ICD-10-CM

## 2022-07-28 DIAGNOSIS — E7849 Other hyperlipidemia: Secondary | ICD-10-CM

## 2022-07-28 DIAGNOSIS — I10 Essential (primary) hypertension: Secondary | ICD-10-CM

## 2022-07-28 MED ORDER — ACCU-CHEK SOFTCLIX LANCETS MISC
0 refills | Status: CP
Start: 2022-07-28 — End: ?

## 2022-07-28 MED ORDER — ACCU-CHEK GUIDE W/DEVICE KIT
0 refills | Status: CP
Start: 2022-07-28 — End: ?

## 2022-07-28 MED ORDER — ACCU-CHEK GUIDE VI STRP
1 | ORAL_STRIP | Freq: Three times a day (TID) | 11 refills | 50.00000 days | Status: CP
Start: 2022-07-28 — End: ?

## 2022-08-11 DIAGNOSIS — I10 Essential (primary) hypertension: Secondary | ICD-10-CM

## 2022-08-11 DIAGNOSIS — E7849 Other hyperlipidemia: Secondary | ICD-10-CM

## 2022-08-11 DIAGNOSIS — N1832 Stage 3b chronic kidney disease (CMS-HCC: 138): Secondary | ICD-10-CM

## 2022-08-11 MED ORDER — ACCU-CHEK AVIVA PLUS VI STRP
1 | ORAL_STRIP | Freq: Three times a day (TID) | 10 refills | 30.00000 days | Status: CP
Start: 2022-08-11 — End: ?

## 2022-08-11 MED ORDER — ACCU-CHEK AVIVA PLUS VI STRP
1 | ORAL_STRIP | Freq: Three times a day (TID) | 10 refills | 30.00000 days | Status: CP
Start: 2022-08-11 — End: 2022-08-11

## 2022-08-11 MED ORDER — ACCU-CHEK GUIDE W/DEVICE KIT
0 refills | Status: CP
Start: 2022-08-11 — End: ?

## 2022-08-11 MED ORDER — ACCU-CHEK GUIDE VI STRP
1 | ORAL_STRIP | Freq: Three times a day (TID) | 11 refills | 50.00000 days | Status: CP
Start: 2022-08-11 — End: ?

## 2022-09-01 DIAGNOSIS — S3289XD Fracture of other parts of pelvis, subsequent encounter for fracture with routine healing: Secondary | ICD-10-CM

## 2022-09-01 DIAGNOSIS — M170 Bilateral primary osteoarthritis of knee: Principal | ICD-10-CM

## 2022-09-01 MED ORDER — HYDROCODONE-ACETAMINOPHEN 5-325 MG PO TABS
1 | ORAL_TABLET | Freq: Four times a day (QID) | ORAL | 0 refills | 30.00000 days | Status: CP | PRN
Start: 2022-09-01 — End: ?

## 2022-09-02 DIAGNOSIS — S3289XD Fracture of other parts of pelvis, subsequent encounter for fracture with routine healing: Secondary | ICD-10-CM

## 2022-09-02 DIAGNOSIS — M170 Bilateral primary osteoarthritis of knee: Principal | ICD-10-CM

## 2022-09-02 MED ORDER — HYDROCODONE-ACETAMINOPHEN 5-325 MG PO TABS
1 | ORAL_TABLET | Freq: Four times a day (QID) | ORAL | 0 refills | PRN
Start: 2022-09-02 — End: ?

## 2022-09-03 MED ORDER — HYDROCODONE-ACETAMINOPHEN 5-325 MG PO TABS
1 | ORAL_TABLET | Freq: Four times a day (QID) | ORAL | 0 refills | 30.00000 days | Status: CP | PRN
Start: 2022-09-03 — End: ?

## 2022-09-06 ENCOUNTER — Encounter: Payer: Medicare Other | Primary: Family Medicine

## 2022-09-06 DIAGNOSIS — M170 Bilateral primary osteoarthritis of knee: Principal | ICD-10-CM

## 2022-09-06 DIAGNOSIS — S3289XD Fracture of other parts of pelvis, subsequent encounter for fracture with routine healing: Secondary | ICD-10-CM

## 2022-09-07 ENCOUNTER — Inpatient Hospital Stay: Admit: 2022-09-07 | Discharge: 2022-09-08 | Payer: Medicare Other | Primary: Family Medicine

## 2022-09-07 ENCOUNTER — Ambulatory Visit: Admit: 2022-09-07 | Discharge: 2022-09-08 | Payer: Medicare Other | Attending: Podiatrist | Primary: Family Medicine

## 2022-09-07 DIAGNOSIS — L609 Nail disorder, unspecified: Secondary | ICD-10-CM

## 2022-09-07 DIAGNOSIS — M1990 Unspecified osteoarthritis, unspecified site: Secondary | ICD-10-CM

## 2022-09-07 DIAGNOSIS — N189 Chronic kidney disease, unspecified: Secondary | ICD-10-CM

## 2022-09-07 DIAGNOSIS — M2041 Other hammer toe(s) (acquired), right foot: Secondary | ICD-10-CM

## 2022-09-07 DIAGNOSIS — I219 Acute myocardial infarction, unspecified: Secondary | ICD-10-CM

## 2022-09-07 DIAGNOSIS — E1142 Type 2 diabetes mellitus with diabetic polyneuropathy: Secondary | ICD-10-CM

## 2022-09-07 DIAGNOSIS — M25571 Pain in right ankle and joints of right foot: Secondary | ICD-10-CM

## 2022-09-07 DIAGNOSIS — I2510 Atherosclerotic heart disease of native coronary artery without angina pectoris: Secondary | ICD-10-CM

## 2022-09-07 DIAGNOSIS — C4492 Squamous cell carcinoma of skin, unspecified: Secondary | ICD-10-CM

## 2022-09-07 DIAGNOSIS — N400 Enlarged prostate without lower urinary tract symptoms: Secondary | ICD-10-CM

## 2022-09-07 DIAGNOSIS — G629 Polyneuropathy, unspecified: Secondary | ICD-10-CM

## 2022-09-07 DIAGNOSIS — Z9109 Other allergy status, other than to drugs and biological substances: Secondary | ICD-10-CM

## 2022-09-07 DIAGNOSIS — I10 Essential (primary) hypertension: Principal | ICD-10-CM

## 2022-09-07 DIAGNOSIS — C4491 Basal cell carcinoma of skin, unspecified: Secondary | ICD-10-CM

## 2022-09-07 DIAGNOSIS — C439 Malignant melanoma of skin, unspecified: Secondary | ICD-10-CM

## 2022-09-07 DIAGNOSIS — L98491 Non-pressure chronic ulcer of skin of other sites limited to breakdown of skin: Secondary | ICD-10-CM

## 2022-09-07 DIAGNOSIS — M542 Cervicalgia: Secondary | ICD-10-CM

## 2022-09-07 DIAGNOSIS — M25572 Pain in left ankle and joints of left foot: Principal | ICD-10-CM

## 2022-09-07 DIAGNOSIS — F419 Anxiety disorder, unspecified: Secondary | ICD-10-CM

## 2022-09-07 DIAGNOSIS — Z899 Acquired absence of limb, unspecified: Secondary | ICD-10-CM

## 2022-09-07 DIAGNOSIS — E785 Hyperlipidemia, unspecified: Secondary | ICD-10-CM

## 2022-09-07 DIAGNOSIS — E119 Type 2 diabetes mellitus without complications: Secondary | ICD-10-CM

## 2022-09-07 DIAGNOSIS — I739 Peripheral vascular disease, unspecified: Secondary | ICD-10-CM

## 2022-09-07 DIAGNOSIS — Z9189 Other specified personal risk factors, not elsewhere classified: Secondary | ICD-10-CM

## 2022-09-07 DIAGNOSIS — K219 Gastro-esophageal reflux disease without esophagitis: Secondary | ICD-10-CM

## 2022-09-08 ENCOUNTER — Ambulatory Visit: Payer: Medicare Other | Attending: Family Medicine | Primary: Family Medicine

## 2022-09-08 DIAGNOSIS — N189 Chronic kidney disease, unspecified: Secondary | ICD-10-CM

## 2022-09-08 DIAGNOSIS — I739 Peripheral vascular disease, unspecified: Secondary | ICD-10-CM

## 2022-09-08 DIAGNOSIS — I219 Acute myocardial infarction, unspecified: Secondary | ICD-10-CM

## 2022-09-08 DIAGNOSIS — N400 Enlarged prostate without lower urinary tract symptoms: Secondary | ICD-10-CM

## 2022-09-08 DIAGNOSIS — F419 Anxiety disorder, unspecified: Secondary | ICD-10-CM

## 2022-09-08 DIAGNOSIS — M542 Cervicalgia: Secondary | ICD-10-CM

## 2022-09-08 DIAGNOSIS — S3289XD Fracture of other parts of pelvis, subsequent encounter for fracture with routine healing: Secondary | ICD-10-CM

## 2022-09-08 DIAGNOSIS — E1151 Type 2 diabetes mellitus with diabetic peripheral angiopathy without gangrene: Secondary | ICD-10-CM

## 2022-09-08 DIAGNOSIS — M170 Bilateral primary osteoarthritis of knee: Secondary | ICD-10-CM

## 2022-09-08 DIAGNOSIS — R296 Repeated falls: Secondary | ICD-10-CM

## 2022-09-08 DIAGNOSIS — Z9641 Presence of insulin pump (external) (internal): Secondary | ICD-10-CM

## 2022-09-08 DIAGNOSIS — K219 Gastro-esophageal reflux disease without esophagitis: Secondary | ICD-10-CM

## 2022-09-08 DIAGNOSIS — L03031 Cellulitis of right toe: Secondary | ICD-10-CM

## 2022-09-08 DIAGNOSIS — N1831 Stage 3a chronic kidney disease (CMS-HCC: 138): Secondary | ICD-10-CM

## 2022-09-08 DIAGNOSIS — E785 Hyperlipidemia, unspecified: Secondary | ICD-10-CM

## 2022-09-08 DIAGNOSIS — M4802 Spinal stenosis, cervical region: Secondary | ICD-10-CM

## 2022-09-08 DIAGNOSIS — G629 Polyneuropathy, unspecified: Secondary | ICD-10-CM

## 2022-09-08 DIAGNOSIS — C4491 Basal cell carcinoma of skin, unspecified: Secondary | ICD-10-CM

## 2022-09-08 DIAGNOSIS — M19049 Primary osteoarthritis, unspecified hand: Secondary | ICD-10-CM

## 2022-09-08 DIAGNOSIS — C4492 Squamous cell carcinoma of skin, unspecified: Secondary | ICD-10-CM

## 2022-09-08 DIAGNOSIS — M1990 Unspecified osteoarthritis, unspecified site: Secondary | ICD-10-CM

## 2022-09-08 DIAGNOSIS — Z9109 Other allergy status, other than to drugs and biological substances: Secondary | ICD-10-CM

## 2022-09-08 DIAGNOSIS — Z9189 Other specified personal risk factors, not elsewhere classified: Secondary | ICD-10-CM

## 2022-09-08 DIAGNOSIS — I2510 Atherosclerotic heart disease of native coronary artery without angina pectoris: Secondary | ICD-10-CM

## 2022-09-08 DIAGNOSIS — C439 Malignant melanoma of skin, unspecified: Secondary | ICD-10-CM

## 2022-09-08 DIAGNOSIS — E119 Type 2 diabetes mellitus without complications: Secondary | ICD-10-CM

## 2022-09-08 DIAGNOSIS — I10 Essential (primary) hypertension: Principal | ICD-10-CM

## 2022-09-08 DIAGNOSIS — I5189 Other ill-defined heart diseases: Secondary | ICD-10-CM

## 2022-09-08 MED ORDER — HYDROCODONE-ACETAMINOPHEN 5-325 MG PO TABS
1 | ORAL_TABLET | Freq: Four times a day (QID) | ORAL | 0 refills | 30.00000 days | Status: CP | PRN
Start: 2022-09-08 — End: ?

## 2022-09-08 MED ORDER — LEVOFLOXACIN 750 MG PO TABS
750 mg | Freq: Every day | ORAL | 0 refills | 7.00000 days | Status: CP
Start: 2022-09-08 — End: ?

## 2022-09-08 MED ORDER — DICLOFENAC SODIUM 1 % EX GEL
2 g | Freq: Four times a day (QID) | TOPICAL | 1 refills | 13.00000 days | Status: CP
Start: 2022-09-08 — End: ?

## 2022-09-20 DIAGNOSIS — M86179 Other acute osteomyelitis, unspecified ankle and foot: Secondary | ICD-10-CM

## 2022-09-28 ENCOUNTER — Ambulatory Visit: Admit: 2022-09-28 | Discharge: 2022-09-29 | Payer: Medicare Other | Primary: Family Medicine

## 2022-09-28 DIAGNOSIS — E785 Hyperlipidemia, unspecified: Secondary | ICD-10-CM

## 2022-09-28 DIAGNOSIS — N400 Enlarged prostate without lower urinary tract symptoms: Secondary | ICD-10-CM

## 2022-09-28 DIAGNOSIS — I739 Peripheral vascular disease, unspecified: Secondary | ICD-10-CM

## 2022-09-28 DIAGNOSIS — C4492 Squamous cell carcinoma of skin, unspecified: Secondary | ICD-10-CM

## 2022-09-28 DIAGNOSIS — Z8631 Personal history of diabetic foot ulcer: Secondary | ICD-10-CM

## 2022-09-28 DIAGNOSIS — Z89429 Acquired absence of other toe(s), unspecified side: Principal | ICD-10-CM

## 2022-09-28 DIAGNOSIS — Z899 Acquired absence of limb, unspecified: Secondary | ICD-10-CM

## 2022-09-28 DIAGNOSIS — F419 Anxiety disorder, unspecified: Secondary | ICD-10-CM

## 2022-09-28 DIAGNOSIS — G629 Polyneuropathy, unspecified: Secondary | ICD-10-CM

## 2022-09-28 DIAGNOSIS — M86179 Other acute osteomyelitis, unspecified ankle and foot: Secondary | ICD-10-CM

## 2022-09-28 DIAGNOSIS — Z9189 Other specified personal risk factors, not elsewhere classified: Secondary | ICD-10-CM

## 2022-09-28 DIAGNOSIS — C439 Malignant melanoma of skin, unspecified: Secondary | ICD-10-CM

## 2022-09-28 DIAGNOSIS — M1990 Unspecified osteoarthritis, unspecified site: Secondary | ICD-10-CM

## 2022-09-28 DIAGNOSIS — E119 Type 2 diabetes mellitus without complications: Secondary | ICD-10-CM

## 2022-09-28 DIAGNOSIS — K219 Gastro-esophageal reflux disease without esophagitis: Secondary | ICD-10-CM

## 2022-09-28 DIAGNOSIS — L98491 Non-pressure chronic ulcer of skin of other sites limited to breakdown of skin: Secondary | ICD-10-CM

## 2022-09-28 DIAGNOSIS — T8789 Other complications of amputation stump: Principal | ICD-10-CM

## 2022-09-28 DIAGNOSIS — N189 Chronic kidney disease, unspecified: Secondary | ICD-10-CM

## 2022-09-28 DIAGNOSIS — M542 Cervicalgia: Secondary | ICD-10-CM

## 2022-09-28 DIAGNOSIS — C4491 Basal cell carcinoma of skin, unspecified: Secondary | ICD-10-CM

## 2022-09-28 DIAGNOSIS — I2510 Atherosclerotic heart disease of native coronary artery without angina pectoris: Secondary | ICD-10-CM

## 2022-09-28 DIAGNOSIS — Z9109 Other allergy status, other than to drugs and biological substances: Secondary | ICD-10-CM

## 2022-09-28 DIAGNOSIS — I10 Essential (primary) hypertension: Principal | ICD-10-CM

## 2022-09-28 DIAGNOSIS — I219 Acute myocardial infarction, unspecified: Secondary | ICD-10-CM

## 2022-10-05 ENCOUNTER — Encounter: Primary: Family Medicine

## 2022-10-11 DIAGNOSIS — I10 Essential (primary) hypertension: Principal | ICD-10-CM

## 2022-10-11 MED ORDER — LOSARTAN POTASSIUM 50 MG PO TABS
1 refills | 30.00 days | Status: CP
Start: 2022-10-11 — End: ?

## 2022-10-19 DIAGNOSIS — M170 Bilateral primary osteoarthritis of knee: Principal | ICD-10-CM

## 2022-10-19 DIAGNOSIS — M4802 Spinal stenosis, cervical region: Secondary | ICD-10-CM

## 2022-10-19 MED ORDER — HYDROCODONE-ACETAMINOPHEN 5-325 MG PO TABS
1 | ORAL_TABLET | Freq: Four times a day (QID) | ORAL | 0 refills | PRN
Start: 2022-10-19 — End: ?

## 2022-10-20 ENCOUNTER — Ambulatory Visit: Payer: Medicare Other | Attending: Podiatrist | Primary: Family Medicine

## 2022-10-20 DIAGNOSIS — F419 Anxiety disorder, unspecified: Secondary | ICD-10-CM

## 2022-10-20 DIAGNOSIS — M1990 Unspecified osteoarthritis, unspecified site: Secondary | ICD-10-CM

## 2022-10-20 DIAGNOSIS — M869 Osteomyelitis, unspecified: Principal | ICD-10-CM

## 2022-10-20 DIAGNOSIS — S91104D Unspecified open wound of right lesser toe(s) without damage to nail, subsequent encounter: Secondary | ICD-10-CM

## 2022-10-20 DIAGNOSIS — C4491 Basal cell carcinoma of skin, unspecified: Secondary | ICD-10-CM

## 2022-10-20 DIAGNOSIS — Z9189 Other specified personal risk factors, not elsewhere classified: Secondary | ICD-10-CM

## 2022-10-20 DIAGNOSIS — E119 Type 2 diabetes mellitus without complications: Secondary | ICD-10-CM

## 2022-10-20 DIAGNOSIS — E785 Hyperlipidemia, unspecified: Secondary | ICD-10-CM

## 2022-10-20 DIAGNOSIS — K219 Gastro-esophageal reflux disease without esophagitis: Secondary | ICD-10-CM

## 2022-10-20 DIAGNOSIS — I10 Essential (primary) hypertension: Principal | ICD-10-CM

## 2022-10-20 DIAGNOSIS — Z89421 Acquired absence of other right toe(s): Secondary | ICD-10-CM

## 2022-10-20 DIAGNOSIS — M542 Cervicalgia: Secondary | ICD-10-CM

## 2022-10-20 DIAGNOSIS — N189 Chronic kidney disease, unspecified: Secondary | ICD-10-CM

## 2022-10-20 DIAGNOSIS — N400 Enlarged prostate without lower urinary tract symptoms: Secondary | ICD-10-CM

## 2022-10-20 DIAGNOSIS — I739 Peripheral vascular disease, unspecified: Secondary | ICD-10-CM

## 2022-10-20 DIAGNOSIS — I2510 Atherosclerotic heart disease of native coronary artery without angina pectoris: Secondary | ICD-10-CM

## 2022-10-20 DIAGNOSIS — C4492 Squamous cell carcinoma of skin, unspecified: Secondary | ICD-10-CM

## 2022-10-20 DIAGNOSIS — Z9109 Other allergy status, other than to drugs and biological substances: Secondary | ICD-10-CM

## 2022-10-20 DIAGNOSIS — G629 Polyneuropathy, unspecified: Secondary | ICD-10-CM

## 2022-10-20 DIAGNOSIS — I219 Acute myocardial infarction, unspecified: Secondary | ICD-10-CM

## 2022-10-20 DIAGNOSIS — C439 Malignant melanoma of skin, unspecified: Secondary | ICD-10-CM

## 2022-10-26 ENCOUNTER — Inpatient Hospital Stay: Primary: Family Medicine

## 2022-10-27 ENCOUNTER — Ambulatory Visit: Payer: Medicare Other | Attending: Podiatrist | Primary: Family Medicine

## 2022-10-27 DIAGNOSIS — E872 Metabolic acidosis: Secondary | ICD-10-CM

## 2022-10-27 DIAGNOSIS — E559 Vitamin D deficiency, unspecified: Secondary | ICD-10-CM

## 2022-10-27 DIAGNOSIS — N400 Enlarged prostate without lower urinary tract symptoms: Secondary | ICD-10-CM

## 2022-10-27 DIAGNOSIS — I10 Essential (primary) hypertension: Principal | ICD-10-CM

## 2022-10-27 DIAGNOSIS — N189 Chronic kidney disease, unspecified: Secondary | ICD-10-CM

## 2022-10-27 DIAGNOSIS — K219 Gastro-esophageal reflux disease without esophagitis: Secondary | ICD-10-CM

## 2022-10-27 DIAGNOSIS — M869 Osteomyelitis, unspecified: Principal | ICD-10-CM

## 2022-10-27 DIAGNOSIS — E119 Type 2 diabetes mellitus without complications: Secondary | ICD-10-CM

## 2022-10-27 DIAGNOSIS — M542 Cervicalgia: Secondary | ICD-10-CM

## 2022-10-27 DIAGNOSIS — C4492 Squamous cell carcinoma of skin, unspecified: Secondary | ICD-10-CM

## 2022-10-27 DIAGNOSIS — Z89421 Acquired absence of other right toe(s): Secondary | ICD-10-CM

## 2022-10-27 DIAGNOSIS — N1831 Stage 3a chronic kidney disease (CMS-HCC: 138): Secondary | ICD-10-CM

## 2022-10-27 DIAGNOSIS — I219 Acute myocardial infarction, unspecified: Secondary | ICD-10-CM

## 2022-10-27 DIAGNOSIS — I739 Peripheral vascular disease, unspecified: Secondary | ICD-10-CM

## 2022-10-27 DIAGNOSIS — Z9189 Other specified personal risk factors, not elsewhere classified: Secondary | ICD-10-CM

## 2022-10-27 DIAGNOSIS — S91104D Unspecified open wound of right lesser toe(s) without damage to nail, subsequent encounter: Secondary | ICD-10-CM

## 2022-10-27 DIAGNOSIS — Z9109 Other allergy status, other than to drugs and biological substances: Secondary | ICD-10-CM

## 2022-10-27 DIAGNOSIS — E785 Hyperlipidemia, unspecified: Secondary | ICD-10-CM

## 2022-10-27 DIAGNOSIS — E1121 Type 2 diabetes mellitus with diabetic nephropathy: Secondary | ICD-10-CM

## 2022-10-27 DIAGNOSIS — I2510 Atherosclerotic heart disease of native coronary artery without angina pectoris: Secondary | ICD-10-CM

## 2022-10-27 DIAGNOSIS — E875 Hyperkalemia: Secondary | ICD-10-CM

## 2022-10-27 DIAGNOSIS — M1990 Unspecified osteoarthritis, unspecified site: Secondary | ICD-10-CM

## 2022-10-27 DIAGNOSIS — C439 Malignant melanoma of skin, unspecified: Secondary | ICD-10-CM

## 2022-10-27 DIAGNOSIS — N1832 Stage 3b chronic kidney disease (CMS-HCC: 138): Principal | ICD-10-CM

## 2022-10-27 DIAGNOSIS — G629 Polyneuropathy, unspecified: Secondary | ICD-10-CM

## 2022-10-27 DIAGNOSIS — F419 Anxiety disorder, unspecified: Secondary | ICD-10-CM

## 2022-10-27 DIAGNOSIS — C4491 Basal cell carcinoma of skin, unspecified: Secondary | ICD-10-CM

## 2022-11-04 ENCOUNTER — Inpatient Hospital Stay: Payer: Medicare Other | Primary: Family Medicine

## 2022-11-08 ENCOUNTER — Ambulatory Visit: Payer: Medicare Other | Attending: Family Medicine | Primary: Family Medicine

## 2022-11-08 DIAGNOSIS — Z9109 Other allergy status, other than to drugs and biological substances: Secondary | ICD-10-CM

## 2022-11-08 DIAGNOSIS — I10 Essential (primary) hypertension: Secondary | ICD-10-CM

## 2022-11-08 DIAGNOSIS — Z89429 Acquired absence of other toe(s), unspecified side: Secondary | ICD-10-CM

## 2022-11-08 DIAGNOSIS — S3289XD Fracture of other parts of pelvis, subsequent encounter for fracture with routine healing: Secondary | ICD-10-CM

## 2022-11-08 DIAGNOSIS — M4802 Spinal stenosis, cervical region: Secondary | ICD-10-CM

## 2022-11-08 DIAGNOSIS — M170 Bilateral primary osteoarthritis of knee: Secondary | ICD-10-CM

## 2022-11-08 DIAGNOSIS — Z9189 Other specified personal risk factors, not elsewhere classified: Secondary | ICD-10-CM

## 2022-11-08 DIAGNOSIS — N1831 Stage 3a chronic kidney disease (CMS-HCC: 138): Secondary | ICD-10-CM

## 2022-11-08 DIAGNOSIS — E785 Hyperlipidemia, unspecified: Secondary | ICD-10-CM

## 2022-11-08 DIAGNOSIS — M542 Cervicalgia: Secondary | ICD-10-CM

## 2022-11-08 DIAGNOSIS — F419 Anxiety disorder, unspecified: Secondary | ICD-10-CM

## 2022-11-08 DIAGNOSIS — R296 Repeated falls: Secondary | ICD-10-CM

## 2022-11-08 DIAGNOSIS — Z89421 Acquired absence of other right toe(s): Secondary | ICD-10-CM

## 2022-11-08 DIAGNOSIS — K219 Gastro-esophageal reflux disease without esophagitis: Secondary | ICD-10-CM

## 2022-11-08 DIAGNOSIS — M1990 Unspecified osteoarthritis, unspecified site: Secondary | ICD-10-CM

## 2022-11-08 DIAGNOSIS — Z6825 Body mass index (BMI) 25.0-25.9, adult: Principal | ICD-10-CM

## 2022-11-08 DIAGNOSIS — C4492 Squamous cell carcinoma of skin, unspecified: Secondary | ICD-10-CM

## 2022-11-08 DIAGNOSIS — I25810 Atherosclerosis of coronary artery bypass graft(s) without angina pectoris: Secondary | ICD-10-CM

## 2022-11-08 DIAGNOSIS — Z1211 Encounter for screening for malignant neoplasm of colon: Secondary | ICD-10-CM

## 2022-11-08 DIAGNOSIS — E119 Type 2 diabetes mellitus without complications: Secondary | ICD-10-CM

## 2022-11-08 DIAGNOSIS — I739 Peripheral vascular disease, unspecified: Secondary | ICD-10-CM

## 2022-11-08 DIAGNOSIS — Z9641 Presence of insulin pump (external) (internal): Secondary | ICD-10-CM

## 2022-11-08 DIAGNOSIS — C439 Malignant melanoma of skin, unspecified: Secondary | ICD-10-CM

## 2022-11-08 DIAGNOSIS — N189 Chronic kidney disease, unspecified: Secondary | ICD-10-CM

## 2022-11-08 DIAGNOSIS — I2510 Atherosclerotic heart disease of native coronary artery without angina pectoris: Secondary | ICD-10-CM

## 2022-11-08 DIAGNOSIS — N400 Enlarged prostate without lower urinary tract symptoms: Secondary | ICD-10-CM

## 2022-11-08 DIAGNOSIS — E1151 Type 2 diabetes mellitus with diabetic peripheral angiopathy without gangrene: Secondary | ICD-10-CM

## 2022-11-08 DIAGNOSIS — G629 Polyneuropathy, unspecified: Secondary | ICD-10-CM

## 2022-11-08 DIAGNOSIS — I219 Acute myocardial infarction, unspecified: Secondary | ICD-10-CM

## 2022-11-08 DIAGNOSIS — C4491 Basal cell carcinoma of skin, unspecified: Secondary | ICD-10-CM

## 2022-11-08 MED ORDER — HYDROCODONE-ACETAMINOPHEN 5-325 MG PO TABS
1 | ORAL_TABLET | Freq: Four times a day (QID) | ORAL | 0 refills | 30.00000 days | Status: CP | PRN
Start: 2022-11-08 — End: ?

## 2022-11-09 ENCOUNTER — Inpatient Hospital Stay: Primary: Family Medicine

## 2022-11-11 ENCOUNTER — Encounter: Payer: Medicare Other | Primary: Family Medicine

## 2022-11-12 ENCOUNTER — Ambulatory Visit: Payer: Medicare Other | Attending: Podiatrist | Primary: Family Medicine

## 2022-11-12 DIAGNOSIS — I2510 Atherosclerotic heart disease of native coronary artery without angina pectoris: Secondary | ICD-10-CM

## 2022-11-12 DIAGNOSIS — N400 Enlarged prostate without lower urinary tract symptoms: Secondary | ICD-10-CM

## 2022-11-12 DIAGNOSIS — M542 Cervicalgia: Secondary | ICD-10-CM

## 2022-11-12 DIAGNOSIS — Z9109 Other allergy status, other than to drugs and biological substances: Secondary | ICD-10-CM

## 2022-11-12 DIAGNOSIS — E119 Type 2 diabetes mellitus without complications: Secondary | ICD-10-CM

## 2022-11-12 DIAGNOSIS — Z89421 Acquired absence of other right toe(s): Secondary | ICD-10-CM

## 2022-11-12 DIAGNOSIS — S91104D Unspecified open wound of right lesser toe(s) without damage to nail, subsequent encounter: Secondary | ICD-10-CM

## 2022-11-12 DIAGNOSIS — E785 Hyperlipidemia, unspecified: Secondary | ICD-10-CM

## 2022-11-12 DIAGNOSIS — I739 Peripheral vascular disease, unspecified: Secondary | ICD-10-CM

## 2022-11-12 DIAGNOSIS — K219 Gastro-esophageal reflux disease without esophagitis: Secondary | ICD-10-CM

## 2022-11-12 DIAGNOSIS — C4492 Squamous cell carcinoma of skin, unspecified: Secondary | ICD-10-CM

## 2022-11-12 DIAGNOSIS — N189 Chronic kidney disease, unspecified: Secondary | ICD-10-CM

## 2022-11-12 DIAGNOSIS — M869 Osteomyelitis, unspecified: Principal | ICD-10-CM

## 2022-11-12 DIAGNOSIS — G629 Polyneuropathy, unspecified: Secondary | ICD-10-CM

## 2022-11-12 DIAGNOSIS — M1990 Unspecified osteoarthritis, unspecified site: Secondary | ICD-10-CM

## 2022-11-12 DIAGNOSIS — Z9189 Other specified personal risk factors, not elsewhere classified: Secondary | ICD-10-CM

## 2022-11-12 DIAGNOSIS — I10 Essential (primary) hypertension: Principal | ICD-10-CM

## 2022-11-12 DIAGNOSIS — I219 Acute myocardial infarction, unspecified: Secondary | ICD-10-CM

## 2022-11-12 DIAGNOSIS — C4491 Basal cell carcinoma of skin, unspecified: Secondary | ICD-10-CM

## 2022-11-12 DIAGNOSIS — C439 Malignant melanoma of skin, unspecified: Secondary | ICD-10-CM

## 2022-11-12 DIAGNOSIS — F419 Anxiety disorder, unspecified: Secondary | ICD-10-CM

## 2022-11-15 ENCOUNTER — Ambulatory Visit
Payer: Medicare Other | Attending: Student in an Organized Health Care Education/Training Program | Primary: Family Medicine

## 2022-11-15 DIAGNOSIS — N400 Enlarged prostate without lower urinary tract symptoms: Secondary | ICD-10-CM

## 2022-11-15 DIAGNOSIS — Z9189 Other specified personal risk factors, not elsewhere classified: Secondary | ICD-10-CM

## 2022-11-15 DIAGNOSIS — F419 Anxiety disorder, unspecified: Secondary | ICD-10-CM

## 2022-11-15 DIAGNOSIS — I10 Essential (primary) hypertension: Principal | ICD-10-CM

## 2022-11-15 DIAGNOSIS — N183 Benign hypertension with CKD (chronic kidney disease) stage III (CMS-HCC: 138): Secondary | ICD-10-CM

## 2022-11-15 DIAGNOSIS — E119 Type 2 diabetes mellitus without complications: Secondary | ICD-10-CM

## 2022-11-15 DIAGNOSIS — E1121 Type 2 diabetes mellitus with diabetic nephropathy: Secondary | ICD-10-CM

## 2022-11-15 DIAGNOSIS — M542 Cervicalgia: Secondary | ICD-10-CM

## 2022-11-15 DIAGNOSIS — M1990 Unspecified osteoarthritis, unspecified site: Secondary | ICD-10-CM

## 2022-11-15 DIAGNOSIS — I2510 Atherosclerotic heart disease of native coronary artery without angina pectoris: Secondary | ICD-10-CM

## 2022-11-15 DIAGNOSIS — I219 Acute myocardial infarction, unspecified: Secondary | ICD-10-CM

## 2022-11-15 DIAGNOSIS — Z9109 Other allergy status, other than to drugs and biological substances: Secondary | ICD-10-CM

## 2022-11-15 DIAGNOSIS — C4492 Squamous cell carcinoma of skin, unspecified: Secondary | ICD-10-CM

## 2022-11-15 DIAGNOSIS — E785 Hyperlipidemia, unspecified: Secondary | ICD-10-CM

## 2022-11-15 DIAGNOSIS — N1832 Stage 3b chronic kidney disease (CMS-HCC: 138): Secondary | ICD-10-CM

## 2022-11-15 DIAGNOSIS — K219 Gastro-esophageal reflux disease without esophagitis: Secondary | ICD-10-CM

## 2022-11-15 DIAGNOSIS — I739 Peripheral vascular disease, unspecified: Secondary | ICD-10-CM

## 2022-11-15 DIAGNOSIS — N189 Chronic kidney disease, unspecified: Secondary | ICD-10-CM

## 2022-11-15 DIAGNOSIS — C4491 Basal cell carcinoma of skin, unspecified: Secondary | ICD-10-CM

## 2022-11-15 DIAGNOSIS — N179 Acute kidney failure, unspecified: Principal | ICD-10-CM

## 2022-11-15 DIAGNOSIS — I129 Hypertensive chronic kidney disease with stage 1 through stage 4 chronic kidney disease, or unspecified chronic kidney disease: Secondary | ICD-10-CM

## 2022-11-15 DIAGNOSIS — C439 Malignant melanoma of skin, unspecified: Secondary | ICD-10-CM

## 2022-11-15 DIAGNOSIS — G629 Polyneuropathy, unspecified: Secondary | ICD-10-CM

## 2022-11-29 DIAGNOSIS — N179 Acute kidney failure, unspecified: Principal | ICD-10-CM

## 2022-11-29 DIAGNOSIS — N1832 Stage 3b chronic kidney disease (CMS-HCC: 138): Secondary | ICD-10-CM

## 2022-12-07 ENCOUNTER — Ambulatory Visit: Admit: 2022-12-07 | Discharge: 2022-12-08 | Payer: Medicare Other | Primary: Family Medicine

## 2022-12-07 DIAGNOSIS — F419 Anxiety disorder, unspecified: Secondary | ICD-10-CM

## 2022-12-07 DIAGNOSIS — Z1211 Encounter for screening for malignant neoplasm of colon: Principal | ICD-10-CM

## 2022-12-07 DIAGNOSIS — I10 Essential (primary) hypertension: Principal | ICD-10-CM

## 2022-12-07 DIAGNOSIS — M542 Cervicalgia: Secondary | ICD-10-CM

## 2022-12-07 DIAGNOSIS — G629 Polyneuropathy, unspecified: Secondary | ICD-10-CM

## 2022-12-07 DIAGNOSIS — C4491 Basal cell carcinoma of skin, unspecified: Secondary | ICD-10-CM

## 2022-12-07 DIAGNOSIS — Z9189 Other specified personal risk factors, not elsewhere classified: Secondary | ICD-10-CM

## 2022-12-07 DIAGNOSIS — C439 Malignant melanoma of skin, unspecified: Secondary | ICD-10-CM

## 2022-12-07 DIAGNOSIS — I2510 Atherosclerotic heart disease of native coronary artery without angina pectoris: Secondary | ICD-10-CM

## 2022-12-07 DIAGNOSIS — E119 Type 2 diabetes mellitus without complications: Secondary | ICD-10-CM

## 2022-12-07 DIAGNOSIS — M1990 Unspecified osteoarthritis, unspecified site: Secondary | ICD-10-CM

## 2022-12-07 DIAGNOSIS — I219 Acute myocardial infarction, unspecified: Secondary | ICD-10-CM

## 2022-12-07 DIAGNOSIS — C4492 Squamous cell carcinoma of skin, unspecified: Secondary | ICD-10-CM

## 2022-12-07 DIAGNOSIS — Z9109 Other allergy status, other than to drugs and biological substances: Secondary | ICD-10-CM

## 2022-12-07 DIAGNOSIS — N400 Enlarged prostate without lower urinary tract symptoms: Secondary | ICD-10-CM

## 2022-12-07 DIAGNOSIS — K219 Gastro-esophageal reflux disease without esophagitis: Secondary | ICD-10-CM

## 2022-12-07 DIAGNOSIS — E785 Hyperlipidemia, unspecified: Secondary | ICD-10-CM

## 2022-12-07 DIAGNOSIS — N189 Chronic kidney disease, unspecified: Secondary | ICD-10-CM

## 2022-12-07 DIAGNOSIS — I739 Peripheral vascular disease, unspecified: Secondary | ICD-10-CM

## 2022-12-08 MED ORDER — PEG 3350-KCL-NABCB-NACL-NASULF 236 G PO SOLR
0 refills | Status: CP
Start: 2022-12-08 — End: ?

## 2022-12-15 ENCOUNTER — Ambulatory Visit: Payer: Medicare Other | Attending: Podiatrist | Primary: Family Medicine

## 2022-12-15 ENCOUNTER — Inpatient Hospital Stay: Admit: 2022-12-15 | Discharge: 2022-12-15 | Payer: Medicare Other

## 2022-12-15 ENCOUNTER — Emergency Department: Admit: 2022-12-15 | Discharge: 2022-12-15 | Payer: Medicare Other

## 2022-12-15 DIAGNOSIS — C439 Malignant melanoma of skin, unspecified: Secondary | ICD-10-CM

## 2022-12-15 DIAGNOSIS — N189 Chronic kidney disease, unspecified: Secondary | ICD-10-CM

## 2022-12-15 DIAGNOSIS — R519 Headache: Principal | ICD-10-CM

## 2022-12-15 DIAGNOSIS — I2510 Atherosclerotic heart disease of native coronary artery without angina pectoris: Secondary | ICD-10-CM

## 2022-12-15 DIAGNOSIS — I10 Essential (primary) hypertension: Principal | ICD-10-CM

## 2022-12-15 DIAGNOSIS — I739 Peripheral vascular disease, unspecified: Secondary | ICD-10-CM

## 2022-12-15 DIAGNOSIS — Z9189 Other specified personal risk factors, not elsewhere classified: Secondary | ICD-10-CM

## 2022-12-15 DIAGNOSIS — M542 Cervicalgia: Secondary | ICD-10-CM

## 2022-12-15 DIAGNOSIS — C4492 Squamous cell carcinoma of skin, unspecified: Secondary | ICD-10-CM

## 2022-12-15 DIAGNOSIS — K219 Gastro-esophageal reflux disease without esophagitis: Secondary | ICD-10-CM

## 2022-12-15 DIAGNOSIS — C4491 Basal cell carcinoma of skin, unspecified: Secondary | ICD-10-CM

## 2022-12-15 DIAGNOSIS — E119 Type 2 diabetes mellitus without complications: Secondary | ICD-10-CM

## 2022-12-15 DIAGNOSIS — G629 Polyneuropathy, unspecified: Secondary | ICD-10-CM

## 2022-12-15 DIAGNOSIS — I219 Acute myocardial infarction, unspecified: Secondary | ICD-10-CM

## 2022-12-15 DIAGNOSIS — F419 Anxiety disorder, unspecified: Secondary | ICD-10-CM

## 2022-12-15 DIAGNOSIS — Z9109 Other allergy status, other than to drugs and biological substances: Secondary | ICD-10-CM

## 2022-12-15 DIAGNOSIS — M1990 Unspecified osteoarthritis, unspecified site: Secondary | ICD-10-CM

## 2022-12-15 DIAGNOSIS — N400 Enlarged prostate without lower urinary tract symptoms: Secondary | ICD-10-CM

## 2022-12-15 DIAGNOSIS — E785 Hyperlipidemia, unspecified: Secondary | ICD-10-CM

## 2022-12-15 MED ORDER — PROCHLORPERAZINE MALEATE 10 MG PO TABS
10 mg | Freq: Four times a day (QID) | ORAL | 0 refills | Status: CP | PRN
Start: 2022-12-15 — End: ?

## 2022-12-15 MED ORDER — PROCHLORPERAZINE EDISYLATE 10 MG/2ML IJ SOLN
10 mg | Freq: Once | INTRAMUSCULAR | Status: CP
Start: 2022-12-15 — End: ?

## 2022-12-20 DIAGNOSIS — C4491 Basal cell carcinoma of skin, unspecified: Secondary | ICD-10-CM

## 2022-12-20 DIAGNOSIS — N189 Chronic kidney disease, unspecified: Secondary | ICD-10-CM

## 2022-12-20 DIAGNOSIS — M542 Cervicalgia: Secondary | ICD-10-CM

## 2022-12-20 DIAGNOSIS — F419 Anxiety disorder, unspecified: Secondary | ICD-10-CM

## 2022-12-20 DIAGNOSIS — G629 Polyneuropathy, unspecified: Secondary | ICD-10-CM

## 2022-12-20 DIAGNOSIS — I10 Essential (primary) hypertension: Principal | ICD-10-CM

## 2022-12-20 DIAGNOSIS — C439 Malignant melanoma of skin, unspecified: Secondary | ICD-10-CM

## 2022-12-20 DIAGNOSIS — I219 Acute myocardial infarction, unspecified: Secondary | ICD-10-CM

## 2022-12-20 DIAGNOSIS — M1990 Unspecified osteoarthritis, unspecified site: Secondary | ICD-10-CM

## 2022-12-20 DIAGNOSIS — I739 Peripheral vascular disease, unspecified: Secondary | ICD-10-CM

## 2022-12-20 DIAGNOSIS — Z9109 Other allergy status, other than to drugs and biological substances: Secondary | ICD-10-CM

## 2022-12-20 DIAGNOSIS — K219 Gastro-esophageal reflux disease without esophagitis: Secondary | ICD-10-CM

## 2022-12-20 DIAGNOSIS — E119 Type 2 diabetes mellitus without complications: Secondary | ICD-10-CM

## 2022-12-20 DIAGNOSIS — E785 Hyperlipidemia, unspecified: Secondary | ICD-10-CM

## 2022-12-20 DIAGNOSIS — N400 Enlarged prostate without lower urinary tract symptoms: Secondary | ICD-10-CM

## 2022-12-20 DIAGNOSIS — I2510 Atherosclerotic heart disease of native coronary artery without angina pectoris: Secondary | ICD-10-CM

## 2022-12-20 DIAGNOSIS — Z9189 Other specified personal risk factors, not elsewhere classified: Secondary | ICD-10-CM

## 2022-12-20 DIAGNOSIS — C4492 Squamous cell carcinoma of skin, unspecified: Secondary | ICD-10-CM

## 2022-12-30 DIAGNOSIS — I10 Essential (primary) hypertension: Principal | ICD-10-CM

## 2022-12-30 MED ORDER — LOSARTAN POTASSIUM 50 MG PO TABS
0 refills | Status: CP
Start: 2022-12-30 — End: ?

## 2023-01-05 ENCOUNTER — Ambulatory Visit: Payer: Medicare Other | Attending: Family Medicine | Primary: Family Medicine

## 2023-01-05 DIAGNOSIS — Z9189 Other specified personal risk factors, not elsewhere classified: Secondary | ICD-10-CM

## 2023-01-05 DIAGNOSIS — C4491 Basal cell carcinoma of skin, unspecified: Secondary | ICD-10-CM

## 2023-01-05 DIAGNOSIS — Z6825 Body mass index (BMI) 25.0-25.9, adult: Secondary | ICD-10-CM

## 2023-01-05 DIAGNOSIS — I10 Essential (primary) hypertension: Secondary | ICD-10-CM

## 2023-01-05 DIAGNOSIS — E538 Deficiency of other specified B group vitamins: Secondary | ICD-10-CM

## 2023-01-05 DIAGNOSIS — Z79899 Other long term (current) drug therapy: Secondary | ICD-10-CM

## 2023-01-05 DIAGNOSIS — F419 Anxiety disorder, unspecified: Secondary | ICD-10-CM

## 2023-01-05 DIAGNOSIS — Z85820 Personal history of malignant melanoma of skin: Secondary | ICD-10-CM

## 2023-01-05 DIAGNOSIS — I2510 Atherosclerotic heart disease of native coronary artery without angina pectoris: Secondary | ICD-10-CM

## 2023-01-05 DIAGNOSIS — N400 Enlarged prostate without lower urinary tract symptoms: Secondary | ICD-10-CM

## 2023-01-05 DIAGNOSIS — E785 Hyperlipidemia, unspecified: Secondary | ICD-10-CM

## 2023-01-05 DIAGNOSIS — Z6826 Body mass index (BMI) 26.0-26.9, adult: Principal | ICD-10-CM

## 2023-01-05 DIAGNOSIS — C4492 Squamous cell carcinoma of skin, unspecified: Secondary | ICD-10-CM

## 2023-01-05 DIAGNOSIS — M170 Bilateral primary osteoarthritis of knee: Secondary | ICD-10-CM

## 2023-01-05 DIAGNOSIS — K219 Gastro-esophageal reflux disease without esophagitis: Secondary | ICD-10-CM

## 2023-01-05 DIAGNOSIS — Z981 Arthrodesis status: Secondary | ICD-10-CM

## 2023-01-05 DIAGNOSIS — S3289XD Fracture of other parts of pelvis, subsequent encounter for fracture with routine healing: Secondary | ICD-10-CM

## 2023-01-05 DIAGNOSIS — E119 Type 2 diabetes mellitus without complications: Secondary | ICD-10-CM

## 2023-01-05 DIAGNOSIS — M4802 Spinal stenosis, cervical region: Secondary | ICD-10-CM

## 2023-01-05 DIAGNOSIS — C439 Malignant melanoma of skin, unspecified: Secondary | ICD-10-CM

## 2023-01-05 DIAGNOSIS — I219 Acute myocardial infarction, unspecified: Secondary | ICD-10-CM

## 2023-01-05 DIAGNOSIS — M542 Cervicalgia: Secondary | ICD-10-CM

## 2023-01-05 DIAGNOSIS — Z9109 Other allergy status, other than to drugs and biological substances: Secondary | ICD-10-CM

## 2023-01-05 DIAGNOSIS — M1990 Unspecified osteoarthritis, unspecified site: Secondary | ICD-10-CM

## 2023-01-05 DIAGNOSIS — N189 Chronic kidney disease, unspecified: Secondary | ICD-10-CM

## 2023-01-05 DIAGNOSIS — G629 Polyneuropathy, unspecified: Secondary | ICD-10-CM

## 2023-01-05 DIAGNOSIS — I739 Peripheral vascular disease, unspecified: Secondary | ICD-10-CM

## 2023-01-05 DIAGNOSIS — F411 Generalized anxiety disorder: Secondary | ICD-10-CM

## 2023-01-05 MED ORDER — NALOXONE HCL 4 MG/0.1ML NA LIQD
0 refills | 30.00 days | Status: CP
Start: 2023-01-05 — End: ?

## 2023-01-05 MED ORDER — LORAZEPAM 0.5 MG PO TABS
.5 mg | Freq: Every evening | ORAL | 1 refills | 15.00000 days | Status: CP
Start: 2023-01-05 — End: ?

## 2023-01-05 MED ORDER — HYDROCODONE-ACETAMINOPHEN 5-325 MG PO TABS
1 | ORAL_TABLET | Freq: Four times a day (QID) | ORAL | 0 refills | 30.00000 days | Status: CP | PRN
Start: 2023-01-05 — End: ?

## 2023-01-05 MED ORDER — AMLODIPINE BESYLATE 5 MG PO TABS
5 mg | Freq: Every day | ORAL | 3 refills | Status: CP
Start: 2023-01-05 — End: ?

## 2023-01-17 MED ORDER — HYDROCODONE-ACETAMINOPHEN 5-325 MG PO TABS
1 | ORAL_TABLET | Freq: Four times a day (QID) | ORAL | 0 refills | 30.00000 days | Status: CP | PRN
Start: 2023-01-17 — End: ?

## 2023-01-19 ENCOUNTER — Ambulatory Visit: Payer: Medicare Other | Attending: Podiatrist | Primary: Family Medicine

## 2023-01-19 DIAGNOSIS — E785 Hyperlipidemia, unspecified: Secondary | ICD-10-CM

## 2023-01-19 DIAGNOSIS — E119 Type 2 diabetes mellitus without complications: Secondary | ICD-10-CM

## 2023-01-19 DIAGNOSIS — Z89421 Acquired absence of other right toe(s): Secondary | ICD-10-CM

## 2023-01-19 DIAGNOSIS — C439 Malignant melanoma of skin, unspecified: Secondary | ICD-10-CM

## 2023-01-19 DIAGNOSIS — I2510 Atherosclerotic heart disease of native coronary artery without angina pectoris: Secondary | ICD-10-CM

## 2023-01-19 DIAGNOSIS — I739 Peripheral vascular disease, unspecified: Secondary | ICD-10-CM

## 2023-01-19 DIAGNOSIS — M2042 Other hammer toe(s) (acquired), left foot: Secondary | ICD-10-CM

## 2023-01-19 DIAGNOSIS — M7989 Other specified soft tissue disorders: Secondary | ICD-10-CM

## 2023-01-19 DIAGNOSIS — M869 Osteomyelitis, unspecified: Principal | ICD-10-CM

## 2023-01-19 DIAGNOSIS — Z9189 Other specified personal risk factors, not elsewhere classified: Secondary | ICD-10-CM

## 2023-01-19 DIAGNOSIS — I219 Acute myocardial infarction, unspecified: Secondary | ICD-10-CM

## 2023-01-19 DIAGNOSIS — S91104D Unspecified open wound of right lesser toe(s) without damage to nail, subsequent encounter: Secondary | ICD-10-CM

## 2023-01-19 DIAGNOSIS — M542 Cervicalgia: Secondary | ICD-10-CM

## 2023-01-19 DIAGNOSIS — L84 Corns and callosities: Secondary | ICD-10-CM

## 2023-01-19 DIAGNOSIS — F419 Anxiety disorder, unspecified: Secondary | ICD-10-CM

## 2023-01-19 DIAGNOSIS — K219 Gastro-esophageal reflux disease without esophagitis: Secondary | ICD-10-CM

## 2023-01-19 DIAGNOSIS — C4492 Squamous cell carcinoma of skin, unspecified: Secondary | ICD-10-CM

## 2023-01-19 DIAGNOSIS — I10 Essential (primary) hypertension: Principal | ICD-10-CM

## 2023-01-19 DIAGNOSIS — G629 Polyneuropathy, unspecified: Secondary | ICD-10-CM

## 2023-01-19 DIAGNOSIS — M1990 Unspecified osteoarthritis, unspecified site: Secondary | ICD-10-CM

## 2023-01-19 DIAGNOSIS — N189 Chronic kidney disease, unspecified: Secondary | ICD-10-CM

## 2023-01-19 DIAGNOSIS — Z9109 Other allergy status, other than to drugs and biological substances: Secondary | ICD-10-CM

## 2023-01-19 DIAGNOSIS — C4491 Basal cell carcinoma of skin, unspecified: Secondary | ICD-10-CM

## 2023-01-19 DIAGNOSIS — M79675 Pain in left toe(s): Secondary | ICD-10-CM

## 2023-01-19 DIAGNOSIS — N400 Enlarged prostate without lower urinary tract symptoms: Secondary | ICD-10-CM

## 2023-01-19 MED ORDER — SODIUM CHLORIDE 0.9 % IV SOLN
1000 mL | INTRAVENOUS
Start: 2023-01-19 — End: ?

## 2023-02-08 ENCOUNTER — Ambulatory Visit: Payer: Medicare Other | Attending: Family Medicine | Primary: Family Medicine

## 2023-02-08 DIAGNOSIS — I25810 Atherosclerosis of coronary artery bypass graft(s) without angina pectoris: Secondary | ICD-10-CM

## 2023-02-08 DIAGNOSIS — C439 Malignant melanoma of skin, unspecified: Secondary | ICD-10-CM

## 2023-02-08 DIAGNOSIS — I10 Essential (primary) hypertension: Secondary | ICD-10-CM

## 2023-02-08 DIAGNOSIS — Z951 Presence of aortocoronary bypass graft: Secondary | ICD-10-CM

## 2023-02-08 DIAGNOSIS — K219 Gastro-esophageal reflux disease without esophagitis: Secondary | ICD-10-CM

## 2023-02-08 DIAGNOSIS — Z6826 Body mass index (BMI) 26.0-26.9, adult: Secondary | ICD-10-CM

## 2023-02-08 DIAGNOSIS — M542 Cervicalgia: Secondary | ICD-10-CM

## 2023-02-08 DIAGNOSIS — N400 Enlarged prostate without lower urinary tract symptoms: Secondary | ICD-10-CM

## 2023-02-08 DIAGNOSIS — S3289XD Fracture of other parts of pelvis, subsequent encounter for fracture with routine healing: Secondary | ICD-10-CM

## 2023-02-08 DIAGNOSIS — N1831 Stage 3a chronic kidney disease (CMS-HCC: 138): Secondary | ICD-10-CM

## 2023-02-08 DIAGNOSIS — M170 Bilateral primary osteoarthritis of knee: Secondary | ICD-10-CM

## 2023-02-08 DIAGNOSIS — Z9109 Other allergy status, other than to drugs and biological substances: Secondary | ICD-10-CM

## 2023-02-08 DIAGNOSIS — Z6825 Body mass index (BMI) 25.0-25.9, adult: Principal | ICD-10-CM

## 2023-02-08 DIAGNOSIS — C4492 Squamous cell carcinoma of skin, unspecified: Secondary | ICD-10-CM

## 2023-02-08 DIAGNOSIS — I2510 Atherosclerotic heart disease of native coronary artery without angina pectoris: Secondary | ICD-10-CM

## 2023-02-08 DIAGNOSIS — Z85820 Personal history of malignant melanoma of skin: Secondary | ICD-10-CM

## 2023-02-08 DIAGNOSIS — E119 Type 2 diabetes mellitus without complications: Secondary | ICD-10-CM

## 2023-02-08 DIAGNOSIS — M1990 Unspecified osteoarthritis, unspecified site: Secondary | ICD-10-CM

## 2023-02-08 DIAGNOSIS — I739 Peripheral vascular disease, unspecified: Secondary | ICD-10-CM

## 2023-02-08 DIAGNOSIS — E1151 Type 2 diabetes mellitus with diabetic peripheral angiopathy without gangrene: Secondary | ICD-10-CM

## 2023-02-08 DIAGNOSIS — E785 Hyperlipidemia, unspecified: Secondary | ICD-10-CM

## 2023-02-08 DIAGNOSIS — N189 Chronic kidney disease, unspecified: Secondary | ICD-10-CM

## 2023-02-08 DIAGNOSIS — C4491 Basal cell carcinoma of skin, unspecified: Secondary | ICD-10-CM

## 2023-02-08 DIAGNOSIS — F419 Anxiety disorder, unspecified: Secondary | ICD-10-CM

## 2023-02-08 DIAGNOSIS — Z9189 Other specified personal risk factors, not elsewhere classified: Secondary | ICD-10-CM

## 2023-02-08 DIAGNOSIS — G629 Polyneuropathy, unspecified: Secondary | ICD-10-CM

## 2023-02-08 DIAGNOSIS — Z981 Arthrodesis status: Secondary | ICD-10-CM

## 2023-02-08 DIAGNOSIS — I219 Acute myocardial infarction, unspecified: Secondary | ICD-10-CM

## 2023-02-08 MED ORDER — HYDROCODONE-ACETAMINOPHEN 5-325 MG PO TABS
1 | ORAL_TABLET | Freq: Four times a day (QID) | ORAL | 0 refills | Status: CP | PRN
Start: 2023-02-08 — End: ?

## 2023-02-08 MED ORDER — HYDROCODONE-ACETAMINOPHEN 5-325 MG PO TABS
1 | ORAL_TABLET | Freq: Four times a day (QID) | ORAL | 0 refills | 30.00000 days | Status: CP | PRN
Start: 2023-02-08 — End: ?

## 2023-02-11 MED ORDER — HUMALOG 100 UNIT/ML IJ SOLN
SUBCUTANEOUS | 3 refills | 34.00000 days | Status: CP
Start: 2023-02-11 — End: ?

## 2023-02-14 DIAGNOSIS — E1121 Type 2 diabetes mellitus with diabetic nephropathy: Secondary | ICD-10-CM

## 2023-02-14 DIAGNOSIS — N1832 Stage 3b chronic kidney disease (CMS-HCC: 138): Secondary | ICD-10-CM

## 2023-02-14 DIAGNOSIS — N179 Acute kidney failure, unspecified: Principal | ICD-10-CM

## 2023-02-14 DIAGNOSIS — E1151 Type 2 diabetes mellitus with diabetic peripheral angiopathy without gangrene: Principal | ICD-10-CM

## 2023-02-21 MED ORDER — LORAZEPAM 0.5 MG PO TABS
.5 mg | Freq: Every evening | ORAL | 1 refills | 15.00000 days | Status: CP
Start: 2023-02-21 — End: ?

## 2023-03-02 ENCOUNTER — Ambulatory Visit: Payer: Medicare Other | Attending: "Endocrinology | Primary: Family Medicine

## 2023-03-02 DIAGNOSIS — E11621 Type 2 diabetes mellitus with foot ulcer: Secondary | ICD-10-CM

## 2023-03-02 DIAGNOSIS — N400 Enlarged prostate without lower urinary tract symptoms: Secondary | ICD-10-CM

## 2023-03-02 DIAGNOSIS — E119 Type 2 diabetes mellitus without complications: Secondary | ICD-10-CM

## 2023-03-02 DIAGNOSIS — M542 Cervicalgia: Secondary | ICD-10-CM

## 2023-03-02 DIAGNOSIS — I219 Acute myocardial infarction, unspecified: Secondary | ICD-10-CM

## 2023-03-02 DIAGNOSIS — I10 Essential (primary) hypertension: Principal | ICD-10-CM

## 2023-03-02 DIAGNOSIS — K219 Gastro-esophageal reflux disease without esophagitis: Secondary | ICD-10-CM

## 2023-03-02 DIAGNOSIS — I739 Peripheral vascular disease, unspecified: Secondary | ICD-10-CM

## 2023-03-02 DIAGNOSIS — I2510 Atherosclerotic heart disease of native coronary artery without angina pectoris: Secondary | ICD-10-CM

## 2023-03-02 DIAGNOSIS — N189 Chronic kidney disease, unspecified: Secondary | ICD-10-CM

## 2023-03-02 DIAGNOSIS — E1151 Type 2 diabetes mellitus with diabetic peripheral angiopathy without gangrene: Principal | ICD-10-CM

## 2023-03-02 DIAGNOSIS — L97502 Non-pressure chronic ulcer of other part of unspecified foot with fat layer exposed: Secondary | ICD-10-CM

## 2023-03-02 DIAGNOSIS — Z9189 Other specified personal risk factors, not elsewhere classified: Secondary | ICD-10-CM

## 2023-03-02 DIAGNOSIS — Z9109 Other allergy status, other than to drugs and biological substances: Secondary | ICD-10-CM

## 2023-03-02 DIAGNOSIS — E785 Hyperlipidemia, unspecified: Secondary | ICD-10-CM

## 2023-03-02 DIAGNOSIS — C4492 Squamous cell carcinoma of skin, unspecified: Secondary | ICD-10-CM

## 2023-03-02 DIAGNOSIS — G629 Polyneuropathy, unspecified: Secondary | ICD-10-CM

## 2023-03-02 DIAGNOSIS — F419 Anxiety disorder, unspecified: Secondary | ICD-10-CM

## 2023-03-02 DIAGNOSIS — M1990 Unspecified osteoarthritis, unspecified site: Secondary | ICD-10-CM

## 2023-03-02 DIAGNOSIS — C439 Malignant melanoma of skin, unspecified: Secondary | ICD-10-CM

## 2023-03-02 DIAGNOSIS — C4491 Basal cell carcinoma of skin, unspecified: Secondary | ICD-10-CM

## 2023-03-02 MED ORDER — CIPROFLOXACIN HCL 500 MG PO TABS
500 mg | Freq: Two times a day (BID) | ORAL | 0 refills | Status: CP
Start: 2023-03-02 — End: ?

## 2023-03-04 DIAGNOSIS — I10 Essential (primary) hypertension: Principal | ICD-10-CM

## 2023-03-04 DIAGNOSIS — I25118 Atherosclerotic heart disease of native coronary artery with other forms of angina pectoris: Secondary | ICD-10-CM

## 2023-03-04 MED ORDER — HUMALOG 100 UNIT/ML IJ SOLN
SUBCUTANEOUS | 3 refills | 34.00000 days | Status: CP
Start: 2023-03-04 — End: ?

## 2023-03-04 MED ORDER — DEXCOM G7 SENSOR MISC
8 refills | Status: CP
Start: 2023-03-04 — End: ?

## 2023-03-04 MED ORDER — DEXCOM G7 RECEIVER DEVI
0 refills | Status: CP
Start: 2023-03-04 — End: ?

## 2023-03-04 MED ORDER — METFORMIN HCL 1000 MG PO TABS
1000 mg | Freq: Two times a day (BID) | ORAL | 1 refills | 90.00 days | Status: CP
Start: 2023-03-04 — End: ?

## 2023-03-07 MED ORDER — ATORVASTATIN CALCIUM 40 MG PO TABS
40 mg | Freq: Every day | ORAL | 0 refills | Status: CP
Start: 2023-03-07 — End: ?

## 2023-03-07 MED ORDER — CLOPIDOGREL BISULFATE 75 MG PO TABS
75 mg | Freq: Every day | ORAL | 0 refills | 90.00000 days | Status: CP
Start: 2023-03-07 — End: ?

## 2023-03-07 MED ORDER — METOPROLOL SUCCINATE ER 50 MG PO TB24
50 mg | Freq: Two times a day (BID) | ORAL | 0 refills | 90.00000 days | Status: CP
Start: 2023-03-07 — End: ?

## 2023-03-15 ENCOUNTER — Encounter: Payer: Medicare Other | Attending: Family Medicine | Primary: Family Medicine

## 2023-03-16 ENCOUNTER — Encounter: Payer: Medicare Other | Attending: Podiatrist | Primary: Family Medicine

## 2023-03-18 DIAGNOSIS — I25118 Atherosclerotic heart disease of native coronary artery with other forms of angina pectoris: Principal | ICD-10-CM

## 2023-03-21 ENCOUNTER — Ambulatory Visit
Payer: Medicare Other | Attending: Student in an Organized Health Care Education/Training Program | Primary: Family Medicine

## 2023-03-21 DIAGNOSIS — Z9109 Other allergy status, other than to drugs and biological substances: Secondary | ICD-10-CM

## 2023-03-21 DIAGNOSIS — M1990 Unspecified osteoarthritis, unspecified site: Secondary | ICD-10-CM

## 2023-03-21 DIAGNOSIS — E1121 Type 2 diabetes mellitus with diabetic nephropathy: Secondary | ICD-10-CM

## 2023-03-21 DIAGNOSIS — N183 Benign hypertension with CKD (chronic kidney disease) stage III (CMS-HCC: 138): Secondary | ICD-10-CM

## 2023-03-21 DIAGNOSIS — Z9189 Other specified personal risk factors, not elsewhere classified: Secondary | ICD-10-CM

## 2023-03-21 DIAGNOSIS — N189 Chronic kidney disease, unspecified: Secondary | ICD-10-CM

## 2023-03-21 DIAGNOSIS — I739 Peripheral vascular disease, unspecified: Secondary | ICD-10-CM

## 2023-03-21 DIAGNOSIS — N400 Enlarged prostate without lower urinary tract symptoms: Secondary | ICD-10-CM

## 2023-03-21 DIAGNOSIS — I10 Essential (primary) hypertension: Principal | ICD-10-CM

## 2023-03-21 DIAGNOSIS — I2510 Atherosclerotic heart disease of native coronary artery without angina pectoris: Secondary | ICD-10-CM

## 2023-03-21 DIAGNOSIS — N1831 Stage 3a chronic kidney disease (CMS-HCC: 138): Principal | ICD-10-CM

## 2023-03-21 DIAGNOSIS — G629 Polyneuropathy, unspecified: Secondary | ICD-10-CM

## 2023-03-21 DIAGNOSIS — C4492 Squamous cell carcinoma of skin, unspecified: Secondary | ICD-10-CM

## 2023-03-21 DIAGNOSIS — C439 Malignant melanoma of skin, unspecified: Secondary | ICD-10-CM

## 2023-03-21 DIAGNOSIS — K219 Gastro-esophageal reflux disease without esophagitis: Secondary | ICD-10-CM

## 2023-03-21 DIAGNOSIS — E119 Type 2 diabetes mellitus without complications: Secondary | ICD-10-CM

## 2023-03-21 DIAGNOSIS — C4491 Basal cell carcinoma of skin, unspecified: Secondary | ICD-10-CM

## 2023-03-21 DIAGNOSIS — D631 Anemia in chronic kidney disease: Secondary | ICD-10-CM

## 2023-03-21 DIAGNOSIS — I219 Acute myocardial infarction, unspecified: Secondary | ICD-10-CM

## 2023-03-21 DIAGNOSIS — E785 Hyperlipidemia, unspecified: Secondary | ICD-10-CM

## 2023-03-21 DIAGNOSIS — M542 Cervicalgia: Secondary | ICD-10-CM

## 2023-03-21 DIAGNOSIS — I129 Hypertensive chronic kidney disease with stage 1 through stage 4 chronic kidney disease, or unspecified chronic kidney disease: Secondary | ICD-10-CM

## 2023-03-21 DIAGNOSIS — F419 Anxiety disorder, unspecified: Secondary | ICD-10-CM

## 2023-03-21 MED ORDER — TAMSULOSIN HCL 0.4 MG PO CAPS
0.4 mg | Freq: Every day | ORAL | 0 refills | Status: CP
Start: 2023-03-21 — End: ?

## 2023-03-21 MED ORDER — CLOPIDOGREL BISULFATE 75 MG PO TABS
75 mg | Freq: Every day | ORAL | 0 refills
Start: 2023-03-21 — End: ?

## 2023-03-28 DIAGNOSIS — I10 Essential (primary) hypertension: Principal | ICD-10-CM

## 2023-03-29 ENCOUNTER — Ambulatory Visit: Payer: Medicare Other | Attending: Family Medicine | Primary: Family Medicine

## 2023-03-29 DIAGNOSIS — S3289XD Fracture of other parts of pelvis, subsequent encounter for fracture with routine healing: Secondary | ICD-10-CM

## 2023-03-29 DIAGNOSIS — Z6826 Body mass index (BMI) 26.0-26.9, adult: Secondary | ICD-10-CM

## 2023-03-29 DIAGNOSIS — G629 Polyneuropathy, unspecified: Secondary | ICD-10-CM

## 2023-03-29 DIAGNOSIS — Z9189 Other specified personal risk factors, not elsewhere classified: Secondary | ICD-10-CM

## 2023-03-29 DIAGNOSIS — Z6825 Body mass index (BMI) 25.0-25.9, adult: Principal | ICD-10-CM

## 2023-03-29 DIAGNOSIS — E785 Hyperlipidemia, unspecified: Secondary | ICD-10-CM

## 2023-03-29 DIAGNOSIS — K219 Gastro-esophageal reflux disease without esophagitis: Secondary | ICD-10-CM

## 2023-03-29 DIAGNOSIS — C4491 Basal cell carcinoma of skin, unspecified: Secondary | ICD-10-CM

## 2023-03-29 DIAGNOSIS — E538 Deficiency of other specified B group vitamins: Secondary | ICD-10-CM

## 2023-03-29 DIAGNOSIS — F419 Anxiety disorder, unspecified: Secondary | ICD-10-CM

## 2023-03-29 DIAGNOSIS — Z951 Presence of aortocoronary bypass graft: Secondary | ICD-10-CM

## 2023-03-29 DIAGNOSIS — I25810 Atherosclerosis of coronary artery bypass graft(s) without angina pectoris: Secondary | ICD-10-CM

## 2023-03-29 DIAGNOSIS — I25118 Atherosclerotic heart disease of native coronary artery with other forms of angina pectoris: Secondary | ICD-10-CM

## 2023-03-29 DIAGNOSIS — E1151 Type 2 diabetes mellitus with diabetic peripheral angiopathy without gangrene: Secondary | ICD-10-CM

## 2023-03-29 DIAGNOSIS — N400 Enlarged prostate without lower urinary tract symptoms: Secondary | ICD-10-CM

## 2023-03-29 DIAGNOSIS — I10 Essential (primary) hypertension: Secondary | ICD-10-CM

## 2023-03-29 DIAGNOSIS — I739 Peripheral vascular disease, unspecified: Secondary | ICD-10-CM

## 2023-03-29 DIAGNOSIS — C4492 Squamous cell carcinoma of skin, unspecified: Secondary | ICD-10-CM

## 2023-03-29 DIAGNOSIS — N189 Chronic kidney disease, unspecified: Secondary | ICD-10-CM

## 2023-03-29 DIAGNOSIS — Z85820 Personal history of malignant melanoma of skin: Secondary | ICD-10-CM

## 2023-03-29 DIAGNOSIS — I2510 Atherosclerotic heart disease of native coronary artery without angina pectoris: Secondary | ICD-10-CM

## 2023-03-29 DIAGNOSIS — Z794 Long term (current) use of insulin: Secondary | ICD-10-CM

## 2023-03-29 DIAGNOSIS — N401 Enlarged prostate with lower urinary tract symptoms: Secondary | ICD-10-CM

## 2023-03-29 DIAGNOSIS — M1990 Unspecified osteoarthritis, unspecified site: Secondary | ICD-10-CM

## 2023-03-29 DIAGNOSIS — Z79899 Other long term (current) drug therapy: Secondary | ICD-10-CM

## 2023-03-29 DIAGNOSIS — Z9109 Other allergy status, other than to drugs and biological substances: Secondary | ICD-10-CM

## 2023-03-29 DIAGNOSIS — E119 Type 2 diabetes mellitus without complications: Secondary | ICD-10-CM

## 2023-03-29 DIAGNOSIS — I219 Acute myocardial infarction, unspecified: Secondary | ICD-10-CM

## 2023-03-29 DIAGNOSIS — F411 Generalized anxiety disorder: Secondary | ICD-10-CM

## 2023-03-29 DIAGNOSIS — M170 Bilateral primary osteoarthritis of knee: Secondary | ICD-10-CM

## 2023-03-29 DIAGNOSIS — N1831 Stage 3a chronic kidney disease (CMS-HCC: 138): Secondary | ICD-10-CM

## 2023-03-29 DIAGNOSIS — E1142 Type 2 diabetes mellitus with diabetic polyneuropathy: Secondary | ICD-10-CM

## 2023-03-29 DIAGNOSIS — R351 Nocturia: Secondary | ICD-10-CM

## 2023-03-29 DIAGNOSIS — M542 Cervicalgia: Secondary | ICD-10-CM

## 2023-03-29 DIAGNOSIS — C439 Malignant melanoma of skin, unspecified: Secondary | ICD-10-CM

## 2023-03-29 MED ORDER — NALOXONE HCL 4 MG/0.1ML NA LIQD
0 refills | Status: CP
Start: 2023-03-29 — End: ?

## 2023-03-29 MED ORDER — NITROGLYCERIN 0.4 MG SL SUBL
0.4 mg | SUBLINGUAL | 1 refills | 25.00000 days | Status: CP | PRN
Start: 2023-03-29 — End: ?

## 2023-03-29 MED ORDER — ATORVASTATIN CALCIUM 40 MG PO TABS
40 mg | Freq: Every day | ORAL | 0 refills | Status: CP
Start: 2023-03-29 — End: ?

## 2023-03-29 MED ORDER — METOPROLOL SUCCINATE ER 50 MG PO TB24
50 mg | Freq: Two times a day (BID) | ORAL | 0 refills
Start: 2023-03-29 — End: ?

## 2023-03-29 MED ORDER — HYDROCODONE-ACETAMINOPHEN 5-325 MG PO TABS
1 | ORAL_TABLET | Freq: Four times a day (QID) | ORAL | 0 refills | 30.00000 days | Status: CP | PRN
Start: 2023-03-29 — End: ?

## 2023-03-29 MED ORDER — GABAPENTIN 600 MG PO TABS
600 mg | Freq: Three times a day (TID) | ORAL | 0 refills | Status: CP
Start: 2023-03-29 — End: ?

## 2023-03-29 MED ORDER — LORAZEPAM 0.5 MG PO TABS
.5 mg | Freq: Every evening | ORAL | 1 refills | 15.00000 days | Status: CP
Start: 2023-03-29 — End: ?

## 2023-03-29 MED ORDER — TAMSULOSIN HCL 0.4 MG PO CAPS
0.4 mg | Freq: Every day | ORAL | 0 refills | Status: CP
Start: 2023-03-29 — End: ?

## 2023-03-29 MED ORDER — METOPROLOL SUCCINATE ER 50 MG PO TB24
50 mg | Freq: Two times a day (BID) | ORAL | 0 refills | Status: CP
Start: 2023-03-29 — End: ?

## 2023-03-29 MED ORDER — LOSARTAN POTASSIUM 50 MG PO TABS
0 refills | 30.00 days | Status: CP
Start: 2023-03-29 — End: ?

## 2023-03-29 MED ORDER — AMLODIPINE BESYLATE 5 MG PO TABS
5 mg | Freq: Every day | ORAL | 3 refills | Status: CP
Start: 2023-03-29 — End: ?

## 2023-04-01 DIAGNOSIS — N1831 Stage 3a chronic kidney disease (CMS-HCC: 138): Secondary | ICD-10-CM

## 2023-04-01 DIAGNOSIS — M869 Osteomyelitis, unspecified: Secondary | ICD-10-CM

## 2023-04-01 DIAGNOSIS — R519 Headache: Principal | ICD-10-CM

## 2023-04-01 DIAGNOSIS — M79671 Pain in right foot: Secondary | ICD-10-CM

## 2023-04-01 DIAGNOSIS — I509 Heart failure, unspecified: Secondary | ICD-10-CM

## 2023-04-01 DIAGNOSIS — I10 Essential (primary) hypertension: Secondary | ICD-10-CM

## 2023-04-01 MED ORDER — PROCHLORPERAZINE MALEATE 10 MG PO TABS
10 mg | Freq: Four times a day (QID) | ORAL | 0 refills | Status: CP | PRN
Start: 2023-04-01 — End: ?

## 2023-04-11 MED ORDER — HYDROCODONE-ACETAMINOPHEN 5-325 MG PO TABS
1 | ORAL_TABLET | Freq: Four times a day (QID) | ORAL | 0 refills | 30.00000 days | Status: CP | PRN
Start: 2023-04-11 — End: ?

## 2023-04-11 MED ORDER — LORAZEPAM 0.5 MG PO TABS
.5 mg | Freq: Every evening | ORAL | 1 refills | 15.00000 days | Status: CP
Start: 2023-04-11 — End: ?

## 2023-04-18 DIAGNOSIS — I10 Essential (primary) hypertension: Principal | ICD-10-CM

## 2023-04-20 MED ORDER — LOSARTAN POTASSIUM 50 MG PO TABS
0 refills | Status: CP
Start: 2023-04-20 — End: ?

## 2023-04-29 ENCOUNTER — Ambulatory Visit: Payer: Medicare Other | Attending: Family Medicine | Primary: Family Medicine

## 2023-04-29 DIAGNOSIS — Z794 Long term (current) use of insulin: Secondary | ICD-10-CM

## 2023-04-29 DIAGNOSIS — N189 Chronic kidney disease, unspecified: Secondary | ICD-10-CM

## 2023-04-29 DIAGNOSIS — I10 Essential (primary) hypertension: Principal | ICD-10-CM

## 2023-04-29 DIAGNOSIS — C4492 Squamous cell carcinoma of skin, unspecified: Secondary | ICD-10-CM

## 2023-04-29 DIAGNOSIS — E1142 Type 2 diabetes mellitus with diabetic polyneuropathy: Principal | ICD-10-CM

## 2023-04-29 DIAGNOSIS — Z9109 Other allergy status, other than to drugs and biological substances: Secondary | ICD-10-CM

## 2023-04-29 DIAGNOSIS — I739 Peripheral vascular disease, unspecified: Secondary | ICD-10-CM

## 2023-04-29 DIAGNOSIS — C439 Malignant melanoma of skin, unspecified: Secondary | ICD-10-CM

## 2023-04-29 DIAGNOSIS — I6330 Cerebral infarction due to thrombosis of unspecified cerebral artery: Secondary | ICD-10-CM

## 2023-04-29 DIAGNOSIS — E785 Hyperlipidemia, unspecified: Secondary | ICD-10-CM

## 2023-04-29 DIAGNOSIS — N400 Enlarged prostate without lower urinary tract symptoms: Secondary | ICD-10-CM

## 2023-04-29 DIAGNOSIS — K219 Gastro-esophageal reflux disease without esophagitis: Secondary | ICD-10-CM

## 2023-04-29 DIAGNOSIS — I219 Acute myocardial infarction, unspecified: Secondary | ICD-10-CM

## 2023-04-29 DIAGNOSIS — Z6825 Body mass index (BMI) 25.0-25.9, adult: Secondary | ICD-10-CM

## 2023-04-29 DIAGNOSIS — M542 Cervicalgia: Secondary | ICD-10-CM

## 2023-04-29 DIAGNOSIS — E1151 Type 2 diabetes mellitus with diabetic peripheral angiopathy without gangrene: Secondary | ICD-10-CM

## 2023-04-29 DIAGNOSIS — M1990 Unspecified osteoarthritis, unspecified site: Secondary | ICD-10-CM

## 2023-04-29 DIAGNOSIS — C4491 Basal cell carcinoma of skin, unspecified: Secondary | ICD-10-CM

## 2023-04-29 DIAGNOSIS — F419 Anxiety disorder, unspecified: Secondary | ICD-10-CM

## 2023-04-29 DIAGNOSIS — Z9189 Other specified personal risk factors, not elsewhere classified: Secondary | ICD-10-CM

## 2023-04-29 DIAGNOSIS — I2510 Atherosclerotic heart disease of native coronary artery without angina pectoris: Secondary | ICD-10-CM

## 2023-04-29 DIAGNOSIS — G629 Polyneuropathy, unspecified: Secondary | ICD-10-CM

## 2023-04-29 DIAGNOSIS — E119 Type 2 diabetes mellitus without complications: Secondary | ICD-10-CM

## 2023-04-29 MED ORDER — ASPIRIN 81 MG PO TBEC
81 mg | Freq: Every day | ORAL | 1 refills | Status: CP
Start: 2023-04-29 — End: ?

## 2023-04-29 MED ORDER — EMPAGLIFLOZIN 25 MG PO TABS
25 mg | Freq: Every morning | ORAL | 2 refills | Status: CP
Start: 2023-04-29 — End: ?

## 2023-05-02 ENCOUNTER — Ambulatory Visit
Payer: Medicare Other | Attending: Student in an Organized Health Care Education/Training Program | Primary: Family Medicine

## 2023-05-02 DIAGNOSIS — N189 Chronic kidney disease, unspecified: Secondary | ICD-10-CM

## 2023-05-02 DIAGNOSIS — C4491 Basal cell carcinoma of skin, unspecified: Secondary | ICD-10-CM

## 2023-05-02 DIAGNOSIS — I10 Essential (primary) hypertension: Principal | ICD-10-CM

## 2023-05-02 DIAGNOSIS — E1121 Type 2 diabetes mellitus with diabetic nephropathy: Secondary | ICD-10-CM

## 2023-05-02 DIAGNOSIS — N400 Enlarged prostate without lower urinary tract symptoms: Secondary | ICD-10-CM

## 2023-05-02 DIAGNOSIS — N1831 Stage 3a chronic kidney disease (CMS-HCC: 138) (HCC): Principal | ICD-10-CM

## 2023-05-02 DIAGNOSIS — E785 Hyperlipidemia, unspecified: Secondary | ICD-10-CM

## 2023-05-02 DIAGNOSIS — D631 Anemia in chronic kidney disease: Secondary | ICD-10-CM

## 2023-05-02 DIAGNOSIS — E119 Type 2 diabetes mellitus without complications: Secondary | ICD-10-CM

## 2023-05-02 DIAGNOSIS — N183 Benign hypertension with CKD (chronic kidney disease) stage III (CMS-HCC: 138) (HCC): Secondary | ICD-10-CM

## 2023-05-02 DIAGNOSIS — I129 Hypertensive chronic kidney disease with stage 1 through stage 4 chronic kidney disease, or unspecified chronic kidney disease: Secondary | ICD-10-CM

## 2023-05-02 DIAGNOSIS — I219 Acute myocardial infarction, unspecified: Secondary | ICD-10-CM

## 2023-05-02 DIAGNOSIS — K219 Gastro-esophageal reflux disease without esophagitis: Secondary | ICD-10-CM

## 2023-05-02 DIAGNOSIS — Z9109 Other allergy status, other than to drugs and biological substances: Secondary | ICD-10-CM

## 2023-05-02 DIAGNOSIS — C439 Malignant melanoma of skin, unspecified: Secondary | ICD-10-CM

## 2023-05-02 DIAGNOSIS — M542 Cervicalgia: Secondary | ICD-10-CM

## 2023-05-02 DIAGNOSIS — M1990 Unspecified osteoarthritis, unspecified site: Secondary | ICD-10-CM

## 2023-05-02 DIAGNOSIS — I739 Peripheral vascular disease, unspecified: Secondary | ICD-10-CM

## 2023-05-02 DIAGNOSIS — G629 Polyneuropathy, unspecified: Secondary | ICD-10-CM

## 2023-05-02 DIAGNOSIS — F419 Anxiety disorder, unspecified: Secondary | ICD-10-CM

## 2023-05-02 DIAGNOSIS — C4492 Squamous cell carcinoma of skin, unspecified: Secondary | ICD-10-CM

## 2023-05-02 DIAGNOSIS — I2510 Atherosclerotic heart disease of native coronary artery without angina pectoris: Secondary | ICD-10-CM

## 2023-05-02 DIAGNOSIS — Z9189 Other specified personal risk factors, not elsewhere classified: Secondary | ICD-10-CM

## 2023-05-04 ENCOUNTER — Ambulatory Visit: Payer: Medicare Other | Attending: "Endocrinology | Primary: Family Medicine

## 2023-05-04 DIAGNOSIS — Z9189 Other specified personal risk factors, not elsewhere classified: Secondary | ICD-10-CM

## 2023-05-04 DIAGNOSIS — C4492 Squamous cell carcinoma of skin, unspecified: Secondary | ICD-10-CM

## 2023-05-04 DIAGNOSIS — E1142 Type 2 diabetes mellitus with diabetic polyneuropathy: Principal | ICD-10-CM

## 2023-05-04 DIAGNOSIS — E119 Type 2 diabetes mellitus without complications: Secondary | ICD-10-CM

## 2023-05-04 DIAGNOSIS — N400 Enlarged prostate without lower urinary tract symptoms: Secondary | ICD-10-CM

## 2023-05-04 DIAGNOSIS — F419 Anxiety disorder, unspecified: Secondary | ICD-10-CM

## 2023-05-04 DIAGNOSIS — N189 Chronic kidney disease, unspecified: Secondary | ICD-10-CM

## 2023-05-04 DIAGNOSIS — I10 Essential (primary) hypertension: Secondary | ICD-10-CM

## 2023-05-04 DIAGNOSIS — I219 Acute myocardial infarction, unspecified: Secondary | ICD-10-CM

## 2023-05-04 DIAGNOSIS — I2510 Atherosclerotic heart disease of native coronary artery without angina pectoris: Secondary | ICD-10-CM

## 2023-05-04 DIAGNOSIS — I739 Peripheral vascular disease, unspecified: Secondary | ICD-10-CM

## 2023-05-04 DIAGNOSIS — E11621 Type 2 diabetes mellitus with foot ulcer: Secondary | ICD-10-CM

## 2023-05-04 DIAGNOSIS — E7849 Other hyperlipidemia: Secondary | ICD-10-CM

## 2023-05-04 DIAGNOSIS — Z794 Long term (current) use of insulin: Secondary | ICD-10-CM

## 2023-05-04 DIAGNOSIS — N1832 Stage 3b chronic kidney disease (CMS-HCC: 138) (HCC): Secondary | ICD-10-CM

## 2023-05-04 DIAGNOSIS — K219 Gastro-esophageal reflux disease without esophagitis: Secondary | ICD-10-CM

## 2023-05-04 DIAGNOSIS — L97502 Non-pressure chronic ulcer of other part of unspecified foot with fat layer exposed: Secondary | ICD-10-CM

## 2023-05-04 DIAGNOSIS — G629 Polyneuropathy, unspecified: Secondary | ICD-10-CM

## 2023-05-04 DIAGNOSIS — C4491 Basal cell carcinoma of skin, unspecified: Secondary | ICD-10-CM

## 2023-05-04 DIAGNOSIS — M542 Cervicalgia: Secondary | ICD-10-CM

## 2023-05-04 DIAGNOSIS — C439 Malignant melanoma of skin, unspecified: Secondary | ICD-10-CM

## 2023-05-04 DIAGNOSIS — M1990 Unspecified osteoarthritis, unspecified site: Secondary | ICD-10-CM

## 2023-05-04 DIAGNOSIS — E785 Hyperlipidemia, unspecified: Secondary | ICD-10-CM

## 2023-05-04 DIAGNOSIS — Z9109 Other allergy status, other than to drugs and biological substances: Secondary | ICD-10-CM

## 2023-05-04 MED ORDER — DEXCOM G7 RECEIVER DEVI
0 refills | Status: CP
Start: 2023-05-04 — End: ?

## 2023-05-10 ENCOUNTER — Encounter: Payer: Medicare Other | Attending: Family Medicine | Primary: Family Medicine

## 2023-05-16 DIAGNOSIS — I25118 Atherosclerotic heart disease of native coronary artery with other forms of angina pectoris: Secondary | ICD-10-CM

## 2023-05-16 DIAGNOSIS — I509 Heart failure, unspecified: Secondary | ICD-10-CM

## 2023-05-16 DIAGNOSIS — N1831 Stage 3a chronic kidney disease (CMS-HCC: 138) (HCC): Principal | ICD-10-CM

## 2023-05-16 DIAGNOSIS — I10 Essential (primary) hypertension: Secondary | ICD-10-CM

## 2023-05-17 MED ORDER — INSULIN PEN NEEDLE 31G X 6 MM MISC
PEN_INJECTOR | 1 refills | Status: CP
Start: 2023-05-17 — End: ?

## 2023-05-17 MED ORDER — METOPROLOL SUCCINATE ER 50 MG PO TB24
50 mg | Freq: Two times a day (BID) | ORAL | 0 refills | Status: CP
Start: 2023-05-17 — End: ?

## 2023-05-17 MED ORDER — ACCU-CHEK GUIDE W/DEVICE KIT
0 refills | Status: CP
Start: 2023-05-17 — End: ?

## 2023-05-17 MED ORDER — LOSARTAN POTASSIUM 50 MG PO TABS
0 refills | Status: CP
Start: 2023-05-17 — End: ?

## 2023-05-17 MED ORDER — CLOPIDOGREL BISULFATE 75 MG PO TABS
75 mg | Freq: Every day | ORAL | 0 refills | 90.00000 days | Status: CP
Start: 2023-05-17 — End: ?

## 2023-05-17 MED ORDER — ATORVASTATIN CALCIUM 40 MG PO TABS
40 mg | Freq: Every day | ORAL | 0 refills | Status: CP
Start: 2023-05-17 — End: ?

## 2023-05-17 MED ORDER — TAMSULOSIN HCL 0.4 MG PO CAPS
0.4 mg | Freq: Every day | ORAL | 0 refills | 30.00 days | Status: CP
Start: 2023-05-17 — End: ?

## 2023-05-23 ENCOUNTER — Encounter: Payer: Medicare Other | Attending: Family Medicine | Primary: Family Medicine

## 2023-05-30 ENCOUNTER — Ambulatory Visit: Payer: Medicare Other | Attending: Family Medicine | Primary: Family Medicine

## 2023-05-30 DIAGNOSIS — Z85820 Personal history of malignant melanoma of skin: Secondary | ICD-10-CM

## 2023-05-30 DIAGNOSIS — C4492 Squamous cell carcinoma of skin, unspecified: Secondary | ICD-10-CM

## 2023-05-30 DIAGNOSIS — Z981 Arthrodesis status: Secondary | ICD-10-CM

## 2023-05-30 DIAGNOSIS — I739 Peripheral vascular disease, unspecified: Secondary | ICD-10-CM

## 2023-05-30 DIAGNOSIS — E1151 Type 2 diabetes mellitus with diabetic peripheral angiopathy without gangrene: Secondary | ICD-10-CM

## 2023-05-30 DIAGNOSIS — M170 Bilateral primary osteoarthritis of knee: Secondary | ICD-10-CM

## 2023-05-30 DIAGNOSIS — I25810 Atherosclerosis of coronary artery bypass graft(s) without angina pectoris: Secondary | ICD-10-CM

## 2023-05-30 DIAGNOSIS — Z9189 Other specified personal risk factors, not elsewhere classified: Secondary | ICD-10-CM

## 2023-05-30 DIAGNOSIS — C439 Malignant melanoma of skin, unspecified: Secondary | ICD-10-CM

## 2023-05-30 DIAGNOSIS — N189 Chronic kidney disease, unspecified: Secondary | ICD-10-CM

## 2023-05-30 DIAGNOSIS — I10 Essential (primary) hypertension: Secondary | ICD-10-CM

## 2023-05-30 DIAGNOSIS — I219 Acute myocardial infarction, unspecified: Secondary | ICD-10-CM

## 2023-05-30 DIAGNOSIS — Z9109 Other allergy status, other than to drugs and biological substances: Secondary | ICD-10-CM

## 2023-05-30 DIAGNOSIS — E538 Deficiency of other specified B group vitamins: Principal | ICD-10-CM

## 2023-05-30 DIAGNOSIS — K219 Gastro-esophageal reflux disease without esophagitis: Secondary | ICD-10-CM

## 2023-05-30 DIAGNOSIS — G629 Polyneuropathy, unspecified: Secondary | ICD-10-CM

## 2023-05-30 DIAGNOSIS — C4491 Basal cell carcinoma of skin, unspecified: Secondary | ICD-10-CM

## 2023-05-30 DIAGNOSIS — E785 Hyperlipidemia, unspecified: Secondary | ICD-10-CM

## 2023-05-30 DIAGNOSIS — Z1159 Encounter for screening for other viral diseases: Secondary | ICD-10-CM

## 2023-05-30 DIAGNOSIS — Z794 Long term (current) use of insulin: Secondary | ICD-10-CM

## 2023-05-30 DIAGNOSIS — M542 Cervicalgia: Secondary | ICD-10-CM

## 2023-05-30 DIAGNOSIS — Z9641 Presence of insulin pump (external) (internal): Secondary | ICD-10-CM

## 2023-05-30 DIAGNOSIS — S3289XD Fracture of other parts of pelvis, subsequent encounter for fracture with routine healing: Secondary | ICD-10-CM

## 2023-05-30 DIAGNOSIS — N1831 Stage 3a chronic kidney disease (CMS-HCC: 138) (HCC): Secondary | ICD-10-CM

## 2023-05-30 DIAGNOSIS — Z951 Presence of aortocoronary bypass graft: Secondary | ICD-10-CM

## 2023-05-30 DIAGNOSIS — E119 Type 2 diabetes mellitus without complications: Secondary | ICD-10-CM

## 2023-05-30 DIAGNOSIS — F419 Anxiety disorder, unspecified: Secondary | ICD-10-CM

## 2023-05-30 DIAGNOSIS — E1142 Type 2 diabetes mellitus with diabetic polyneuropathy: Secondary | ICD-10-CM

## 2023-05-30 DIAGNOSIS — M1990 Unspecified osteoarthritis, unspecified site: Secondary | ICD-10-CM

## 2023-05-30 DIAGNOSIS — N400 Enlarged prostate without lower urinary tract symptoms: Secondary | ICD-10-CM

## 2023-05-30 DIAGNOSIS — I2510 Atherosclerotic heart disease of native coronary artery without angina pectoris: Secondary | ICD-10-CM

## 2023-05-30 MED ORDER — HYDROCODONE-ACETAMINOPHEN 5-325 MG PO TABS
1 | ORAL_TABLET | Freq: Four times a day (QID) | ORAL | 0 refills | 30.00000 days | Status: CP | PRN
Start: 2023-05-30 — End: ?

## 2023-05-30 MED ORDER — DEXCOM G7 SENSOR MISC
8 refills | Status: CP
Start: 2023-05-30 — End: ?

## 2023-06-07 ENCOUNTER — Encounter: Payer: Medicare Other | Attending: Family Medicine | Primary: Family Medicine

## 2023-07-11 ENCOUNTER — Ambulatory Visit: Payer: Medicare Other | Attending: Family Medicine | Primary: Family Medicine

## 2023-07-11 DIAGNOSIS — C439 Malignant melanoma of skin, unspecified: Secondary | ICD-10-CM

## 2023-07-11 DIAGNOSIS — Z9109 Other allergy status, other than to drugs and biological substances: Secondary | ICD-10-CM

## 2023-07-11 DIAGNOSIS — G629 Polyneuropathy, unspecified: Secondary | ICD-10-CM

## 2023-07-11 DIAGNOSIS — K224 Dyskinesia of esophagus: Secondary | ICD-10-CM

## 2023-07-11 DIAGNOSIS — M1990 Unspecified osteoarthritis, unspecified site: Secondary | ICD-10-CM

## 2023-07-11 DIAGNOSIS — E119 Type 2 diabetes mellitus without complications: Secondary | ICD-10-CM

## 2023-07-11 DIAGNOSIS — I739 Peripheral vascular disease, unspecified: Secondary | ICD-10-CM

## 2023-07-11 DIAGNOSIS — Z85820 Personal history of malignant melanoma of skin: Secondary | ICD-10-CM

## 2023-07-11 DIAGNOSIS — S3289XD Fracture of other parts of pelvis, subsequent encounter for fracture with routine healing: Secondary | ICD-10-CM

## 2023-07-11 DIAGNOSIS — K219 Gastro-esophageal reflux disease without esophagitis: Secondary | ICD-10-CM

## 2023-07-11 DIAGNOSIS — C4491 Basal cell carcinoma of skin, unspecified: Secondary | ICD-10-CM

## 2023-07-11 DIAGNOSIS — I10 Essential (primary) hypertension: Principal | ICD-10-CM

## 2023-07-11 DIAGNOSIS — M170 Bilateral primary osteoarthritis of knee: Secondary | ICD-10-CM

## 2023-07-11 DIAGNOSIS — I219 Acute myocardial infarction, unspecified: Secondary | ICD-10-CM

## 2023-07-11 DIAGNOSIS — E785 Hyperlipidemia, unspecified: Secondary | ICD-10-CM

## 2023-07-11 DIAGNOSIS — N400 Enlarged prostate without lower urinary tract symptoms: Secondary | ICD-10-CM

## 2023-07-11 DIAGNOSIS — Z6826 Body mass index (BMI) 26.0-26.9, adult: Secondary | ICD-10-CM

## 2023-07-11 DIAGNOSIS — I2510 Atherosclerotic heart disease of native coronary artery without angina pectoris: Secondary | ICD-10-CM

## 2023-07-11 DIAGNOSIS — F419 Anxiety disorder, unspecified: Secondary | ICD-10-CM

## 2023-07-11 DIAGNOSIS — Z1211 Encounter for screening for malignant neoplasm of colon: Secondary | ICD-10-CM

## 2023-07-11 DIAGNOSIS — M67911 Unspecified disorder of synovium and tendon, right shoulder: Secondary | ICD-10-CM

## 2023-07-11 DIAGNOSIS — Q2112 PFO (patent foramen ovale): Secondary | ICD-10-CM

## 2023-07-11 DIAGNOSIS — M542 Cervicalgia: Secondary | ICD-10-CM

## 2023-07-11 DIAGNOSIS — I25810 Atherosclerosis of coronary artery bypass graft(s) without angina pectoris: Secondary | ICD-10-CM

## 2023-07-11 DIAGNOSIS — S32511S Fracture of superior rim of right pubis, sequela: Secondary | ICD-10-CM

## 2023-07-11 DIAGNOSIS — C4492 Squamous cell carcinoma of skin, unspecified: Secondary | ICD-10-CM

## 2023-07-11 DIAGNOSIS — Z9189 Other specified personal risk factors, not elsewhere classified: Secondary | ICD-10-CM

## 2023-07-11 DIAGNOSIS — N189 Chronic kidney disease, unspecified: Secondary | ICD-10-CM

## 2023-07-11 MED ORDER — ACCU-CHEK AVIVA PLUS VI STRP
1 | ORAL_STRIP | Freq: Three times a day (TID) | 10 refills | 30.00000 days | Status: CP
Start: 2023-07-11 — End: ?

## 2023-07-11 MED ORDER — HYDROCODONE-ACETAMINOPHEN 5-325 MG PO TABS
1 | ORAL_TABLET | Freq: Four times a day (QID) | ORAL | 0 refills | 30.00000 days | Status: CP | PRN
Start: 2023-07-11 — End: ?

## 2023-07-12 ENCOUNTER — Encounter: Payer: Medicare Other | Attending: Family Medicine | Primary: Family Medicine

## 2023-07-23 DIAGNOSIS — E1151 Type 2 diabetes mellitus with diabetic peripheral angiopathy without gangrene: Principal | ICD-10-CM

## 2023-07-23 DIAGNOSIS — I10 Essential (primary) hypertension: Principal | ICD-10-CM

## 2023-07-23 DIAGNOSIS — I6330 Cerebral infarction due to thrombosis of unspecified cerebral artery: Secondary | ICD-10-CM

## 2023-07-23 DIAGNOSIS — F411 Generalized anxiety disorder: Principal | ICD-10-CM

## 2023-07-23 DIAGNOSIS — I25118 Atherosclerotic heart disease of native coronary artery with other forms of angina pectoris: Secondary | ICD-10-CM

## 2023-07-23 MED ORDER — DEXCOM G7 SENSOR MISC
8 refills
Start: 2023-07-23 — End: ?

## 2023-07-23 MED ORDER — AMLODIPINE BESYLATE 5 MG PO TABS
5 mg | Freq: Every day | ORAL | 3 refills
Start: 2023-07-23 — End: ?

## 2023-07-23 MED ORDER — LORAZEPAM 0.5 MG PO TABS
.5 mg | Freq: Every evening | ORAL | 1 refills
Start: 2023-07-23 — End: ?

## 2023-07-25 DIAGNOSIS — I10 Essential (primary) hypertension: Principal | ICD-10-CM

## 2023-07-25 DIAGNOSIS — I25118 Atherosclerotic heart disease of native coronary artery with other forms of angina pectoris: Principal | ICD-10-CM

## 2023-07-25 MED ORDER — METFORMIN HCL 1000 MG PO TABS
1000 mg | Freq: Two times a day (BID) | ORAL | 1 refills | Status: CP
Start: 2023-07-25 — End: ?

## 2023-07-25 MED ORDER — DEXCOM G7 RECEIVER DEVI
0 refills | Status: CP
Start: 2023-07-25 — End: ?

## 2023-07-26 MED ORDER — LOSARTAN POTASSIUM 50 MG PO TABS
0 refills
Start: 2023-07-26 — End: ?

## 2023-07-26 MED ORDER — GABAPENTIN 600 MG PO TABS
600 mg | Freq: Three times a day (TID) | ORAL | 0 refills
Start: 2023-07-26 — End: ?

## 2023-07-26 MED ORDER — CLOPIDOGREL BISULFATE 75 MG PO TABS
75 mg | Freq: Every day | ORAL | 0 refills
Start: 2023-07-26 — End: ?

## 2023-07-26 MED ORDER — ASPIRIN 81 MG PO TBEC
81 mg | Freq: Every day | ORAL | 2 refills | Status: CP
Start: 2023-07-26 — End: ?

## 2023-07-26 MED ORDER — LORAZEPAM 0.5 MG PO TABS
.5 mg | Freq: Every evening | ORAL | 1 refills
Start: 2023-07-26 — End: ?

## 2023-07-26 MED ORDER — ATORVASTATIN CALCIUM 40 MG PO TABS
40 mg | Freq: Every day | ORAL | 0 refills
Start: 2023-07-26 — End: ?

## 2023-07-26 MED ORDER — GABAPENTIN 600 MG PO TABS
600 mg | Freq: Three times a day (TID) | ORAL | 0 refills | Status: CP
Start: 2023-07-26 — End: ?

## 2023-07-26 MED ORDER — ASPIRIN 81 MG PO TBEC
81 mg | Freq: Every day | ORAL | 1 refills
Start: 2023-07-26 — End: ?

## 2023-07-26 MED ORDER — TAMSULOSIN HCL 0.4 MG PO CAPS
0.4 mg | Freq: Every day | ORAL | 0 refills
Start: 2023-07-26 — End: ?

## 2023-07-26 MED ORDER — DEXCOM G7 SENSOR MISC
8 refills
Start: 2023-07-26 — End: ?

## 2023-07-26 MED ORDER — AMLODIPINE BESYLATE 5 MG PO TABS
5 mg | Freq: Every day | ORAL | 3 refills
Start: 2023-07-26 — End: ?

## 2023-07-26 MED ORDER — METOPROLOL SUCCINATE ER 50 MG PO TB24
50 mg | Freq: Two times a day (BID) | ORAL | 0 refills
Start: 2023-07-26 — End: ?

## 2023-07-28 ENCOUNTER — Ambulatory Visit: Payer: Medicare Other | Attending: Nurse Practitioner | Primary: Family Medicine

## 2023-07-28 DIAGNOSIS — I10 Essential (primary) hypertension: Secondary | ICD-10-CM

## 2023-07-28 DIAGNOSIS — K219 Gastro-esophageal reflux disease without esophagitis: Secondary | ICD-10-CM

## 2023-07-28 DIAGNOSIS — C439 Malignant melanoma of skin, unspecified: Secondary | ICD-10-CM

## 2023-07-28 DIAGNOSIS — C4492 Squamous cell carcinoma of skin, unspecified: Secondary | ICD-10-CM

## 2023-07-28 DIAGNOSIS — E119 Type 2 diabetes mellitus without complications: Secondary | ICD-10-CM

## 2023-07-28 DIAGNOSIS — M1990 Unspecified osteoarthritis, unspecified site: Secondary | ICD-10-CM

## 2023-07-28 DIAGNOSIS — M542 Cervicalgia: Secondary | ICD-10-CM

## 2023-07-28 DIAGNOSIS — G629 Polyneuropathy, unspecified: Secondary | ICD-10-CM

## 2023-07-28 DIAGNOSIS — Z9109 Other allergy status, other than to drugs and biological substances: Secondary | ICD-10-CM

## 2023-07-28 DIAGNOSIS — N400 Enlarged prostate without lower urinary tract symptoms: Secondary | ICD-10-CM

## 2023-07-28 DIAGNOSIS — I739 Peripheral vascular disease, unspecified: Secondary | ICD-10-CM

## 2023-07-28 DIAGNOSIS — N189 Chronic kidney disease, unspecified: Secondary | ICD-10-CM

## 2023-07-28 DIAGNOSIS — C4491 Basal cell carcinoma of skin, unspecified: Secondary | ICD-10-CM

## 2023-07-28 DIAGNOSIS — F419 Anxiety disorder, unspecified: Secondary | ICD-10-CM

## 2023-07-28 DIAGNOSIS — E785 Hyperlipidemia, unspecified: Secondary | ICD-10-CM

## 2023-07-28 DIAGNOSIS — Z4681 Encounter for fitting and adjustment of insulin pump: Secondary | ICD-10-CM

## 2023-07-28 DIAGNOSIS — I2510 Atherosclerotic heart disease of native coronary artery without angina pectoris: Secondary | ICD-10-CM

## 2023-07-28 DIAGNOSIS — I219 Acute myocardial infarction, unspecified: Secondary | ICD-10-CM

## 2023-07-28 DIAGNOSIS — E7849 Other hyperlipidemia: Secondary | ICD-10-CM

## 2023-07-28 DIAGNOSIS — Z9189 Other specified personal risk factors, not elsewhere classified: Secondary | ICD-10-CM

## 2023-07-28 MED ORDER — LOSARTAN POTASSIUM 50 MG PO TABS
0 refills | Status: CP
Start: 2023-07-28 — End: ?

## 2023-07-28 MED ORDER — CLOPIDOGREL BISULFATE 75 MG PO TABS
75 mg | Freq: Every day | ORAL | 0 refills | 90.00000 days | Status: CP
Start: 2023-07-28 — End: ?

## 2023-07-28 MED ORDER — CLOPIDOGREL BISULFATE 75 MG PO TABS
75 mg | Freq: Every day | ORAL | 0 refills
Start: 2023-07-28 — End: ?

## 2023-07-28 MED ORDER — GABAPENTIN 600 MG PO TABS
600 mg | Freq: Three times a day (TID) | ORAL | 0 refills | 30.00000 days | Status: CP
Start: 2023-07-28 — End: ?

## 2023-07-28 MED ORDER — AMLODIPINE BESYLATE 5 MG PO TABS
5 mg | Freq: Every day | ORAL | 0 refills | Status: CP
Start: 2023-07-28 — End: ?

## 2023-07-28 MED ORDER — METOPROLOL SUCCINATE ER 50 MG PO TB24
50 mg | Freq: Two times a day (BID) | ORAL | 0 refills | 90.00 days | Status: CP
Start: 2023-07-28 — End: ?

## 2023-08-02 ENCOUNTER — Encounter: Payer: Medicare Other | Attending: Family Medicine | Primary: Family Medicine

## 2023-08-02 DIAGNOSIS — N1831 Stage 3a chronic kidney disease (CMS-HCC: 138) (HCC): Principal | ICD-10-CM

## 2023-08-04 ENCOUNTER — Encounter: Payer: Medicare Other | Attending: "Endocrinology | Primary: Family Medicine

## 2023-08-22 MED ORDER — INSULIN PEN NEEDLE 31G X 6 MM MISC
PEN_INJECTOR | 1 refills | Status: CP
Start: 2023-08-22 — End: ?

## 2023-08-22 MED ORDER — LANTUS SOLOSTAR 100 UNIT/ML SC SOPN
24 [IU] | Freq: Every evening | SUBCUTANEOUS | 11 refills | 54.00000 days | Status: CP
Start: 2023-08-22 — End: ?

## 2023-08-22 MED ORDER — HUMALOG KWIKPEN 100 UNIT/ML SC SOPN
3-5 [IU] | Freq: Three times a day (TID) | SUBCUTANEOUS | 11 refills | 34.00000 days | Status: CP
Start: 2023-08-22 — End: ?

## 2023-08-23 ENCOUNTER — Encounter: Payer: Medicare Other | Attending: "Endocrinology | Primary: Family Medicine

## 2023-09-08 ENCOUNTER — Encounter
Payer: Medicare Other | Attending: Student in an Organized Health Care Education/Training Program | Primary: Family Medicine

## 2023-09-21 DIAGNOSIS — E1121 Type 2 diabetes mellitus with diabetic nephropathy: Secondary | ICD-10-CM

## 2023-09-21 DIAGNOSIS — N1831 Stage 3a chronic kidney disease (CMS-HCC: 138): Principal | ICD-10-CM

## 2023-10-04 ENCOUNTER — Ambulatory Visit: Attending: Family Medicine | Primary: Family Medicine

## 2023-10-04 DIAGNOSIS — E538 Deficiency of other specified B group vitamins: Secondary | ICD-10-CM

## 2023-10-04 DIAGNOSIS — G629 Polyneuropathy, unspecified: Secondary | ICD-10-CM

## 2023-10-04 DIAGNOSIS — S3289XD Fracture of other parts of pelvis, subsequent encounter for fracture with routine healing: Secondary | ICD-10-CM

## 2023-10-04 DIAGNOSIS — I25118 Atherosclerotic heart disease of native coronary artery with other forms of angina pectoris: Secondary | ICD-10-CM

## 2023-10-04 DIAGNOSIS — M170 Bilateral primary osteoarthritis of knee: Secondary | ICD-10-CM

## 2023-10-04 DIAGNOSIS — I10 Essential (primary) hypertension: Principal | ICD-10-CM

## 2023-10-04 DIAGNOSIS — E1151 Type 2 diabetes mellitus with diabetic peripheral angiopathy without gangrene: Principal | ICD-10-CM

## 2023-10-04 DIAGNOSIS — E119 Type 2 diabetes mellitus without complications: Secondary | ICD-10-CM

## 2023-10-04 DIAGNOSIS — M542 Cervicalgia: Secondary | ICD-10-CM

## 2023-10-04 DIAGNOSIS — N189 Chronic kidney disease, unspecified: Secondary | ICD-10-CM

## 2023-10-04 DIAGNOSIS — E785 Hyperlipidemia, unspecified: Secondary | ICD-10-CM

## 2023-10-04 DIAGNOSIS — C4491 Basal cell carcinoma of skin, unspecified: Secondary | ICD-10-CM

## 2023-10-04 DIAGNOSIS — Z9109 Other allergy status, other than to drugs and biological substances: Secondary | ICD-10-CM

## 2023-10-04 DIAGNOSIS — I219 Acute myocardial infarction, unspecified: Secondary | ICD-10-CM

## 2023-10-04 DIAGNOSIS — F419 Anxiety disorder, unspecified: Secondary | ICD-10-CM

## 2023-10-04 DIAGNOSIS — Z9189 Other specified personal risk factors, not elsewhere classified: Secondary | ICD-10-CM

## 2023-10-04 DIAGNOSIS — C439 Malignant melanoma of skin, unspecified: Secondary | ICD-10-CM

## 2023-10-04 DIAGNOSIS — N400 Enlarged prostate without lower urinary tract symptoms: Secondary | ICD-10-CM

## 2023-10-04 DIAGNOSIS — I2510 Atherosclerotic heart disease of native coronary artery without angina pectoris: Secondary | ICD-10-CM

## 2023-10-04 DIAGNOSIS — I739 Peripheral vascular disease, unspecified: Secondary | ICD-10-CM

## 2023-10-04 DIAGNOSIS — I6330 Cerebral infarction due to thrombosis of unspecified cerebral artery: Secondary | ICD-10-CM

## 2023-10-04 DIAGNOSIS — M1990 Unspecified osteoarthritis, unspecified site: Secondary | ICD-10-CM

## 2023-10-04 DIAGNOSIS — Z6826 Body mass index (BMI) 26.0-26.9, adult: Secondary | ICD-10-CM

## 2023-10-04 DIAGNOSIS — K219 Gastro-esophageal reflux disease without esophagitis: Secondary | ICD-10-CM

## 2023-10-04 DIAGNOSIS — C4492 Squamous cell carcinoma of skin, unspecified: Secondary | ICD-10-CM

## 2023-10-04 MED ORDER — HYDROCODONE-ACETAMINOPHEN 5-325 MG PO TABS
1 | ORAL_TABLET | Freq: Four times a day (QID) | ORAL | 0 refills | 30.00000 days | Status: CP | PRN
Start: 2023-10-04 — End: 2023-10-04

## 2023-10-04 MED ORDER — DEXCOM G7 SENSOR MISC
1 | Freq: Once | 0 refills | Status: CP
Start: 2023-10-04 — End: ?

## 2023-10-04 MED ORDER — HYDROCODONE-ACETAMINOPHEN 5-325 MG PO TABS
1 | ORAL_TABLET | Freq: Four times a day (QID) | ORAL | 0 refills | 30.00000 days | Status: CP | PRN
Start: 2023-10-04 — End: ?

## 2023-10-06 ENCOUNTER — Encounter: Attending: Family Medicine | Primary: Family Medicine

## 2023-10-28 DIAGNOSIS — I10 Essential (primary) hypertension: Principal | ICD-10-CM

## 2023-10-28 MED ORDER — LOSARTAN POTASSIUM 50 MG PO TABS
50 mg | Freq: Two times a day (BID) | ORAL | 0 refills | 45.00 days | Status: CP
Start: 2023-10-28 — End: ?

## 2023-10-28 MED ORDER — HUMALOG 100 UNIT/ML IJ SOLN
SUBCUTANEOUS | 4 refills | 34.00000 days | Status: CP
Start: 2023-10-28 — End: ?

## 2023-11-01 ENCOUNTER — Encounter: Attending: "Endocrinology | Primary: Family Medicine

## 2023-11-21 DIAGNOSIS — E7849 Other hyperlipidemia: Secondary | ICD-10-CM

## 2023-11-28 ENCOUNTER — Ambulatory Visit: Payer: MEDICARE | Attending: Family Medicine | Primary: Family Medicine

## 2023-11-28 ENCOUNTER — Encounter: Payer: MEDICARE | Attending: Student in an Organized Health Care Education/Training Program | Primary: Family Medicine

## 2023-11-28 DIAGNOSIS — I2510 Atherosclerotic heart disease of native coronary artery without angina pectoris: Secondary | ICD-10-CM

## 2023-11-28 DIAGNOSIS — Z9189 Other specified personal risk factors, not elsewhere classified: Secondary | ICD-10-CM

## 2023-11-28 DIAGNOSIS — E538 Deficiency of other specified B group vitamins: Principal | ICD-10-CM

## 2023-11-28 DIAGNOSIS — M170 Bilateral primary osteoarthritis of knee: Secondary | ICD-10-CM

## 2023-11-28 DIAGNOSIS — I739 Peripheral vascular disease, unspecified: Secondary | ICD-10-CM

## 2023-11-28 DIAGNOSIS — Z79899 Other long term (current) drug therapy: Secondary | ICD-10-CM

## 2023-11-28 DIAGNOSIS — I10 Essential (primary) hypertension: Principal | ICD-10-CM

## 2023-11-28 DIAGNOSIS — I219 Acute myocardial infarction, unspecified: Secondary | ICD-10-CM

## 2023-11-28 DIAGNOSIS — N189 Chronic kidney disease, unspecified: Secondary | ICD-10-CM

## 2023-11-28 DIAGNOSIS — C4491 Basal cell carcinoma of skin, unspecified: Secondary | ICD-10-CM

## 2023-11-28 DIAGNOSIS — C439 Malignant melanoma of skin, unspecified: Secondary | ICD-10-CM

## 2023-11-28 DIAGNOSIS — Z6826 Body mass index (BMI) 26.0-26.9, adult: Secondary | ICD-10-CM

## 2023-11-28 DIAGNOSIS — G4733 Obstructive sleep apnea (adult) (pediatric): Secondary | ICD-10-CM

## 2023-11-28 DIAGNOSIS — M542 Cervicalgia: Secondary | ICD-10-CM

## 2023-11-28 DIAGNOSIS — Z85820 Personal history of malignant melanoma of skin: Secondary | ICD-10-CM

## 2023-11-28 DIAGNOSIS — F419 Anxiety disorder, unspecified: Secondary | ICD-10-CM

## 2023-11-28 DIAGNOSIS — E785 Hyperlipidemia, unspecified: Secondary | ICD-10-CM

## 2023-11-28 DIAGNOSIS — E119 Type 2 diabetes mellitus without complications: Secondary | ICD-10-CM

## 2023-11-28 DIAGNOSIS — C4492 Squamous cell carcinoma of skin, unspecified: Secondary | ICD-10-CM

## 2023-11-28 DIAGNOSIS — I25118 Atherosclerotic heart disease of native coronary artery with other forms of angina pectoris: Secondary | ICD-10-CM

## 2023-11-28 DIAGNOSIS — I25810 Atherosclerosis of coronary artery bypass graft(s) without angina pectoris: Secondary | ICD-10-CM

## 2023-11-28 DIAGNOSIS — N1831 Stage 3a chronic kidney disease (CMS-HCC: 138): Secondary | ICD-10-CM

## 2023-11-28 DIAGNOSIS — M1990 Unspecified osteoarthritis, unspecified site: Secondary | ICD-10-CM

## 2023-11-28 DIAGNOSIS — G629 Polyneuropathy, unspecified: Secondary | ICD-10-CM

## 2023-11-28 DIAGNOSIS — N400 Enlarged prostate without lower urinary tract symptoms: Secondary | ICD-10-CM

## 2023-11-28 DIAGNOSIS — F411 Generalized anxiety disorder: Secondary | ICD-10-CM

## 2023-11-28 DIAGNOSIS — K219 Gastro-esophageal reflux disease without esophagitis: Secondary | ICD-10-CM

## 2023-11-28 DIAGNOSIS — Z9109 Other allergy status, other than to drugs and biological substances: Secondary | ICD-10-CM

## 2023-11-28 MED ORDER — LANCETS MISC
3 refills | Status: CP
Start: 2023-11-28 — End: ?

## 2023-11-28 MED ORDER — ONETOUCH VERIO FLEX SYSTEM W/DEVICE KIT
0 refills | Status: CP
Start: 2023-11-28 — End: ?

## 2023-11-28 MED ORDER — NALOXONE HCL 4 MG/0.1ML NA LIQD
NASAL | 0 refills | 1.00000 days | Status: CP
Start: 2023-11-28 — End: ?

## 2023-11-28 MED ORDER — HYDROCODONE-ACETAMINOPHEN 5-325 MG PO TABS
1 | ORAL_TABLET | Freq: Four times a day (QID) | ORAL | 0 refills | 30.00000 days | Status: CP | PRN
Start: 2023-11-28 — End: 2023-11-28

## 2023-11-28 MED ORDER — LORAZEPAM 0.5 MG PO TABS
.5 mg | Freq: Every evening | ORAL | 1 refills | 15.00000 days | Status: CP
Start: 2023-11-28 — End: ?

## 2023-11-28 MED ORDER — DEXCOM G7 SENSOR MISC
3 refills | Status: CP
Start: 2023-11-28 — End: ?

## 2023-11-28 MED ORDER — HYDROCODONE-ACETAMINOPHEN 5-325 MG PO TABS
1 | ORAL_TABLET | Freq: Four times a day (QID) | ORAL | 0 refills | 30.00000 days | Status: CP | PRN
Start: 2023-11-28 — End: ?

## 2023-11-28 MED ORDER — GLUCOSE BLOOD VI STRP
1 | ORAL_STRIP | Freq: Three times a day (TID) | 3 refills | 33.00000 days | Status: CP
Start: 2023-11-28 — End: ?

## 2023-11-29 ENCOUNTER — Ambulatory Visit: Payer: MEDICARE | Attending: "Endocrinology | Primary: Family Medicine

## 2023-11-29 DIAGNOSIS — N1832 Stage 3b chronic kidney disease (CMS-HCC: 138): Secondary | ICD-10-CM

## 2023-11-29 DIAGNOSIS — M1990 Unspecified osteoarthritis, unspecified site: Secondary | ICD-10-CM

## 2023-11-29 DIAGNOSIS — N400 Enlarged prostate without lower urinary tract symptoms: Secondary | ICD-10-CM

## 2023-11-29 DIAGNOSIS — E785 Hyperlipidemia, unspecified: Secondary | ICD-10-CM

## 2023-11-29 DIAGNOSIS — I10 Essential (primary) hypertension: Principal | ICD-10-CM

## 2023-11-29 DIAGNOSIS — F419 Anxiety disorder, unspecified: Secondary | ICD-10-CM

## 2023-11-29 DIAGNOSIS — G629 Polyneuropathy, unspecified: Secondary | ICD-10-CM

## 2023-11-29 DIAGNOSIS — I739 Peripheral vascular disease, unspecified: Secondary | ICD-10-CM

## 2023-11-29 DIAGNOSIS — E7849 Other hyperlipidemia: Secondary | ICD-10-CM

## 2023-11-29 DIAGNOSIS — E119 Type 2 diabetes mellitus without complications: Secondary | ICD-10-CM

## 2023-11-29 DIAGNOSIS — I2510 Atherosclerotic heart disease of native coronary artery without angina pectoris: Secondary | ICD-10-CM

## 2023-11-29 DIAGNOSIS — M542 Cervicalgia: Secondary | ICD-10-CM

## 2023-11-29 DIAGNOSIS — C4491 Basal cell carcinoma of skin, unspecified: Secondary | ICD-10-CM

## 2023-11-29 DIAGNOSIS — Z9109 Other allergy status, other than to drugs and biological substances: Secondary | ICD-10-CM

## 2023-11-29 DIAGNOSIS — Z4681 Encounter for fitting and adjustment of insulin pump: Secondary | ICD-10-CM

## 2023-11-29 DIAGNOSIS — K219 Gastro-esophageal reflux disease without esophagitis: Secondary | ICD-10-CM

## 2023-11-29 DIAGNOSIS — Z9189 Other specified personal risk factors, not elsewhere classified: Secondary | ICD-10-CM

## 2023-11-29 DIAGNOSIS — C4492 Squamous cell carcinoma of skin, unspecified: Secondary | ICD-10-CM

## 2023-11-29 DIAGNOSIS — N189 Chronic kidney disease, unspecified: Secondary | ICD-10-CM

## 2023-11-29 DIAGNOSIS — C439 Malignant melanoma of skin, unspecified: Secondary | ICD-10-CM

## 2023-11-29 DIAGNOSIS — I219 Acute myocardial infarction, unspecified: Secondary | ICD-10-CM

## 2023-11-29 MED ORDER — OZEMPIC (0.25 OR 0.5 MG/DOSE) 2 MG/3ML SC SOPN
.5 mg | SUBCUTANEOUS | 5 refills | 28.00000 days | Status: CP
Start: 2023-11-29 — End: ?

## 2023-12-01 ENCOUNTER — Encounter: Payer: MEDICARE | Attending: Student in an Organized Health Care Education/Training Program | Primary: Family Medicine

## 2023-12-05 ENCOUNTER — Ambulatory Visit: Admit: 2023-12-05 | Discharge: 2023-12-06 | Payer: MEDICARE | Attending: Podiatrist | Primary: Family Medicine

## 2023-12-05 DIAGNOSIS — K219 Gastro-esophageal reflux disease without esophagitis: Secondary | ICD-10-CM

## 2023-12-05 DIAGNOSIS — I739 Peripheral vascular disease, unspecified: Secondary | ICD-10-CM

## 2023-12-05 DIAGNOSIS — I219 Acute myocardial infarction, unspecified: Secondary | ICD-10-CM

## 2023-12-05 DIAGNOSIS — N189 Chronic kidney disease, unspecified: Secondary | ICD-10-CM

## 2023-12-05 DIAGNOSIS — Z9109 Other allergy status, other than to drugs and biological substances: Secondary | ICD-10-CM

## 2023-12-05 DIAGNOSIS — M1990 Unspecified osteoarthritis, unspecified site: Secondary | ICD-10-CM

## 2023-12-05 DIAGNOSIS — C4492 Squamous cell carcinoma of skin, unspecified: Secondary | ICD-10-CM

## 2023-12-05 DIAGNOSIS — C439 Malignant melanoma of skin, unspecified: Secondary | ICD-10-CM

## 2023-12-05 DIAGNOSIS — E119 Type 2 diabetes mellitus without complications: Secondary | ICD-10-CM

## 2023-12-05 DIAGNOSIS — M2041 Other hammer toe(s) (acquired), right foot: Secondary | ICD-10-CM

## 2023-12-05 DIAGNOSIS — I6350 Cerebral infarction due to unspecified occlusion or stenosis of unspecified cerebral artery: Secondary | ICD-10-CM

## 2023-12-05 DIAGNOSIS — I2510 Atherosclerotic heart disease of native coronary artery without angina pectoris: Secondary | ICD-10-CM

## 2023-12-05 DIAGNOSIS — I10 Essential (primary) hypertension: Principal | ICD-10-CM

## 2023-12-05 DIAGNOSIS — Z9189 Other specified personal risk factors, not elsewhere classified: Secondary | ICD-10-CM

## 2023-12-05 DIAGNOSIS — E785 Hyperlipidemia, unspecified: Secondary | ICD-10-CM

## 2023-12-05 DIAGNOSIS — N400 Enlarged prostate without lower urinary tract symptoms: Secondary | ICD-10-CM

## 2023-12-05 DIAGNOSIS — C4491 Basal cell carcinoma of skin, unspecified: Secondary | ICD-10-CM

## 2023-12-05 DIAGNOSIS — R011 Cardiac murmur, unspecified: Secondary | ICD-10-CM

## 2023-12-05 DIAGNOSIS — G629 Polyneuropathy, unspecified: Secondary | ICD-10-CM

## 2023-12-05 DIAGNOSIS — F419 Anxiety disorder, unspecified: Secondary | ICD-10-CM

## 2023-12-05 DIAGNOSIS — M542 Cervicalgia: Secondary | ICD-10-CM

## 2023-12-07 DIAGNOSIS — I25118 Atherosclerotic heart disease of native coronary artery with other forms of angina pectoris: Principal | ICD-10-CM

## 2023-12-07 DIAGNOSIS — I10 Essential (primary) hypertension: Secondary | ICD-10-CM

## 2023-12-07 DIAGNOSIS — E1151 Type 2 diabetes mellitus with diabetic peripheral angiopathy without gangrene: Principal | ICD-10-CM

## 2023-12-07 MED ORDER — METFORMIN HCL 1000 MG PO TABS
1000 mg | Freq: Two times a day (BID) | ORAL | 0 refills | 90.00000 days | Status: CP
Start: 2023-12-07 — End: ?

## 2023-12-07 MED ORDER — OZEMPIC (0.25 OR 0.5 MG/DOSE) 2 MG/3ML SC SOPN
.5 mg | SUBCUTANEOUS | 2 refills | 28.00000 days | Status: CP
Start: 2023-12-07 — End: ?

## 2023-12-07 MED ORDER — DEXCOM G7 RECEIVER DEVI
0 refills | Status: CP
Start: 2023-12-07 — End: ?

## 2023-12-08 MED ORDER — ONETOUCH VERIO FLEX SYSTEM W/DEVICE KIT
0 refills | Status: CP
Start: 2023-12-08 — End: ?

## 2023-12-08 MED ORDER — METOPROLOL SUCCINATE ER 50 MG PO TB24
50 mg | Freq: Two times a day (BID) | ORAL | 0 refills | 90.00000 days | Status: CP
Start: 2023-12-08 — End: ?

## 2023-12-08 MED ORDER — NITROGLYCERIN 0.4 MG SL SUBL
0.4 mg | SUBLINGUAL | 1 refills | 25.00000 days | Status: CP | PRN
Start: 2023-12-08 — End: ?

## 2023-12-08 MED ORDER — AMLODIPINE BESYLATE 5 MG PO TABS
5 mg | Freq: Every day | ORAL | 0 refills | 90.00000 days | Status: CP
Start: 2023-12-08 — End: ?

## 2023-12-08 MED ORDER — GABAPENTIN 600 MG PO TABS
600 mg | Freq: Three times a day (TID) | ORAL | 0 refills | 30.00000 days | Status: CP
Start: 2023-12-08 — End: ?

## 2023-12-08 MED ORDER — TAMSULOSIN HCL 0.4 MG PO CAPS
0.4 mg | Freq: Every day | ORAL | 0 refills | 30.00000 days | Status: CP
Start: 2023-12-08 — End: ?

## 2023-12-08 MED ORDER — ATORVASTATIN CALCIUM 40 MG PO TABS
40 mg | Freq: Every day | ORAL | 0 refills | 90.00000 days | Status: CP
Start: 2023-12-08 — End: ?

## 2023-12-08 MED ORDER — DEXCOM G7 SENSOR MISC
3 refills | Status: CP
Start: 2023-12-08 — End: ?

## 2023-12-08 MED ORDER — LOSARTAN POTASSIUM 50 MG PO TABS
50 mg | Freq: Two times a day (BID) | ORAL | 0 refills | 90.00000 days | Status: CP
Start: 2023-12-08 — End: ?

## 2023-12-08 MED ORDER — CLOPIDOGREL BISULFATE 75 MG PO TABS
75 mg | Freq: Every day | ORAL | 0 refills | 90.00000 days | Status: CP
Start: 2023-12-08 — End: ?

## 2023-12-09 MED ORDER — DEXCOM G7 SENSOR MISC
3 refills
Start: 2023-12-09 — End: ?

## 2023-12-12 MED ORDER — DEXCOM G7 SENSOR MISC
3 refills | Status: CP
Start: 2023-12-12 — End: ?

## 2023-12-26 ENCOUNTER — Ambulatory Visit: Admit: 2023-12-26 | Discharge: 2023-12-27 | Payer: MEDICARE | Attending: Podiatrist | Primary: Family Medicine

## 2023-12-26 DIAGNOSIS — M1990 Unspecified osteoarthritis, unspecified site: Secondary | ICD-10-CM

## 2023-12-26 DIAGNOSIS — C4492 Squamous cell carcinoma of skin, unspecified: Secondary | ICD-10-CM

## 2023-12-26 DIAGNOSIS — K219 Gastro-esophageal reflux disease without esophagitis: Secondary | ICD-10-CM

## 2023-12-26 DIAGNOSIS — N189 Chronic kidney disease, unspecified: Secondary | ICD-10-CM

## 2023-12-26 DIAGNOSIS — E119 Type 2 diabetes mellitus without complications: Secondary | ICD-10-CM

## 2023-12-26 DIAGNOSIS — M2042 Other hammer toe(s) (acquired), left foot: Secondary | ICD-10-CM

## 2023-12-26 DIAGNOSIS — G629 Polyneuropathy, unspecified: Secondary | ICD-10-CM

## 2023-12-26 DIAGNOSIS — I2510 Atherosclerotic heart disease of native coronary artery without angina pectoris: Secondary | ICD-10-CM

## 2023-12-26 DIAGNOSIS — E1142 Type 2 diabetes mellitus with diabetic polyneuropathy: Secondary | ICD-10-CM

## 2023-12-26 DIAGNOSIS — Z9109 Other allergy status, other than to drugs and biological substances: Secondary | ICD-10-CM

## 2023-12-26 DIAGNOSIS — C439 Malignant melanoma of skin, unspecified: Secondary | ICD-10-CM

## 2023-12-26 DIAGNOSIS — E785 Hyperlipidemia, unspecified: Secondary | ICD-10-CM

## 2023-12-26 DIAGNOSIS — Z9189 Other specified personal risk factors, not elsewhere classified: Secondary | ICD-10-CM

## 2023-12-26 DIAGNOSIS — I739 Peripheral vascular disease, unspecified: Secondary | ICD-10-CM

## 2023-12-26 DIAGNOSIS — N400 Enlarged prostate without lower urinary tract symptoms: Secondary | ICD-10-CM

## 2023-12-26 DIAGNOSIS — F419 Anxiety disorder, unspecified: Secondary | ICD-10-CM

## 2023-12-26 DIAGNOSIS — C4491 Basal cell carcinoma of skin, unspecified: Secondary | ICD-10-CM

## 2023-12-26 DIAGNOSIS — M542 Cervicalgia: Secondary | ICD-10-CM

## 2023-12-26 DIAGNOSIS — M79672 Pain in left foot: Principal | ICD-10-CM

## 2023-12-26 DIAGNOSIS — I219 Acute myocardial infarction, unspecified: Secondary | ICD-10-CM

## 2023-12-26 DIAGNOSIS — I10 Essential (primary) hypertension: Principal | ICD-10-CM

## 2023-12-26 DIAGNOSIS — R011 Cardiac murmur, unspecified: Secondary | ICD-10-CM

## 2023-12-26 DIAGNOSIS — I6350 Cerebral infarction due to unspecified occlusion or stenosis of unspecified cerebral artery: Secondary | ICD-10-CM

## 2023-12-26 MED ORDER — SODIUM CHLORIDE 0.9 % IV SOLN
1000 mL | Freq: Once | INTRAVENOUS
Start: 2023-12-26 — End: ?

## 2023-12-26 MED ORDER — CEFAZOLIN SODIUM 1 G IJ SOLR
2 g | Freq: Once | INTRAVENOUS
Start: 2023-12-26 — End: ?

## 2023-12-27 ENCOUNTER — Encounter: Payer: MEDICARE | Primary: Family Medicine

## 2024-01-02 ENCOUNTER — Encounter: Payer: MEDICARE | Attending: Student in an Organized Health Care Education/Training Program | Primary: Family Medicine

## 2024-01-02 ENCOUNTER — Ambulatory Visit: Payer: MEDICARE | Attending: Student in an Organized Health Care Education/Training Program | Primary: Family Medicine

## 2024-01-02 DIAGNOSIS — C4491 Basal cell carcinoma of skin, unspecified: Secondary | ICD-10-CM

## 2024-01-02 DIAGNOSIS — N1832 Stage 3b chronic kidney disease (CMS-HCC: 138): Principal | ICD-10-CM

## 2024-01-02 DIAGNOSIS — M1990 Unspecified osteoarthritis, unspecified site: Secondary | ICD-10-CM

## 2024-01-02 DIAGNOSIS — C439 Malignant melanoma of skin, unspecified: Secondary | ICD-10-CM

## 2024-01-02 DIAGNOSIS — Z9189 Other specified personal risk factors, not elsewhere classified: Secondary | ICD-10-CM

## 2024-01-02 DIAGNOSIS — I219 Acute myocardial infarction, unspecified: Secondary | ICD-10-CM

## 2024-01-02 DIAGNOSIS — I10 Essential (primary) hypertension: Principal | ICD-10-CM

## 2024-01-02 DIAGNOSIS — G629 Polyneuropathy, unspecified: Secondary | ICD-10-CM

## 2024-01-02 DIAGNOSIS — N189 Chronic kidney disease, unspecified: Secondary | ICD-10-CM

## 2024-01-02 DIAGNOSIS — I739 Peripheral vascular disease, unspecified: Secondary | ICD-10-CM

## 2024-01-02 DIAGNOSIS — R011 Cardiac murmur, unspecified: Secondary | ICD-10-CM

## 2024-01-02 DIAGNOSIS — I6350 Cerebral infarction due to unspecified occlusion or stenosis of unspecified cerebral artery: Secondary | ICD-10-CM

## 2024-01-02 DIAGNOSIS — E1121 Type 2 diabetes mellitus with diabetic nephropathy: Secondary | ICD-10-CM

## 2024-01-02 DIAGNOSIS — I2510 Atherosclerotic heart disease of native coronary artery without angina pectoris: Secondary | ICD-10-CM

## 2024-01-02 DIAGNOSIS — M542 Cervicalgia: Secondary | ICD-10-CM

## 2024-01-02 DIAGNOSIS — N400 Enlarged prostate without lower urinary tract symptoms: Secondary | ICD-10-CM

## 2024-01-02 DIAGNOSIS — K219 Gastro-esophageal reflux disease without esophagitis: Secondary | ICD-10-CM

## 2024-01-02 DIAGNOSIS — Z9109 Other allergy status, other than to drugs and biological substances: Secondary | ICD-10-CM

## 2024-01-02 DIAGNOSIS — E875 Hyperkalemia: Secondary | ICD-10-CM

## 2024-01-02 DIAGNOSIS — E119 Type 2 diabetes mellitus without complications: Secondary | ICD-10-CM

## 2024-01-02 DIAGNOSIS — C4492 Squamous cell carcinoma of skin, unspecified: Secondary | ICD-10-CM

## 2024-01-02 DIAGNOSIS — E785 Hyperlipidemia, unspecified: Secondary | ICD-10-CM

## 2024-01-02 DIAGNOSIS — E872 Metabolic acidosis: Secondary | ICD-10-CM

## 2024-01-02 DIAGNOSIS — F419 Anxiety disorder, unspecified: Secondary | ICD-10-CM

## 2024-01-02 MED ORDER — LOSARTAN POTASSIUM 50 MG PO TABS
50 mg | Freq: Every day | ORAL
Start: 2024-01-02 — End: ?

## 2024-01-02 MED ORDER — AMLODIPINE BESYLATE 2.5 MG PO TABS
2.5 mg | Freq: Every day | ORAL | 3 refills | 90.00000 days | Status: CP
Start: 2024-01-02 — End: ?

## 2024-01-06 ENCOUNTER — Ambulatory Visit: Payer: MEDICARE | Attending: Cardiovascular Disease | Primary: Family Medicine

## 2024-01-06 DIAGNOSIS — C439 Malignant melanoma of skin, unspecified: Secondary | ICD-10-CM

## 2024-01-06 DIAGNOSIS — K219 Gastro-esophageal reflux disease without esophagitis: Secondary | ICD-10-CM

## 2024-01-06 DIAGNOSIS — E119 Type 2 diabetes mellitus without complications: Secondary | ICD-10-CM

## 2024-01-06 DIAGNOSIS — C4492 Squamous cell carcinoma of skin, unspecified: Secondary | ICD-10-CM

## 2024-01-06 DIAGNOSIS — M1990 Unspecified osteoarthritis, unspecified site: Secondary | ICD-10-CM

## 2024-01-06 DIAGNOSIS — I739 Peripheral vascular disease, unspecified: Secondary | ICD-10-CM

## 2024-01-06 DIAGNOSIS — R011 Cardiac murmur, unspecified: Secondary | ICD-10-CM

## 2024-01-06 DIAGNOSIS — G629 Polyneuropathy, unspecified: Secondary | ICD-10-CM

## 2024-01-06 DIAGNOSIS — M542 Cervicalgia: Secondary | ICD-10-CM

## 2024-01-06 DIAGNOSIS — I10 Essential (primary) hypertension: Principal | ICD-10-CM

## 2024-01-06 DIAGNOSIS — Z9189 Other specified personal risk factors, not elsewhere classified: Secondary | ICD-10-CM

## 2024-01-06 DIAGNOSIS — F419 Anxiety disorder, unspecified: Secondary | ICD-10-CM

## 2024-01-06 DIAGNOSIS — N189 Chronic kidney disease, unspecified: Secondary | ICD-10-CM

## 2024-01-06 DIAGNOSIS — E785 Hyperlipidemia, unspecified: Secondary | ICD-10-CM

## 2024-01-06 DIAGNOSIS — C4491 Basal cell carcinoma of skin, unspecified: Secondary | ICD-10-CM

## 2024-01-06 DIAGNOSIS — I2510 Atherosclerotic heart disease of native coronary artery without angina pectoris: Secondary | ICD-10-CM

## 2024-01-06 DIAGNOSIS — I219 Acute myocardial infarction, unspecified: Secondary | ICD-10-CM

## 2024-01-06 DIAGNOSIS — I6350 Cerebral infarction due to unspecified occlusion or stenosis of unspecified cerebral artery: Secondary | ICD-10-CM

## 2024-01-06 DIAGNOSIS — N400 Enlarged prostate without lower urinary tract symptoms: Secondary | ICD-10-CM

## 2024-01-06 DIAGNOSIS — Z9109 Other allergy status, other than to drugs and biological substances: Secondary | ICD-10-CM

## 2024-01-16 DIAGNOSIS — Z899 Acquired absence of limb, unspecified: Secondary | ICD-10-CM

## 2024-01-16 DIAGNOSIS — M79672 Pain in left foot: Secondary | ICD-10-CM

## 2024-01-16 DIAGNOSIS — Z981 Arthrodesis status: Principal | ICD-10-CM

## 2024-01-16 DIAGNOSIS — E1142 Type 2 diabetes mellitus with diabetic polyneuropathy: Secondary | ICD-10-CM

## 2024-01-16 DIAGNOSIS — M2041 Other hammer toe(s) (acquired), right foot: Secondary | ICD-10-CM

## 2024-01-16 DIAGNOSIS — M25572 Pain in left ankle and joints of left foot: Secondary | ICD-10-CM

## 2024-01-16 DIAGNOSIS — M2042 Other hammer toe(s) (acquired), left foot: Principal | ICD-10-CM

## 2024-01-16 DIAGNOSIS — I25118 Atherosclerotic heart disease of native coronary artery with other forms of angina pectoris: Principal | ICD-10-CM

## 2024-01-16 DIAGNOSIS — M792 Neuralgia and neuritis, unspecified: Secondary | ICD-10-CM

## 2024-01-16 MED ORDER — LIDOCAINE 5 % EX PTCH
1 | MEDICATED_PATCH | TRANSDERMAL | 2 refills | 30.00000 days | Status: CP
Start: 2024-01-16 — End: ?

## 2024-01-16 MED ORDER — LIDOCAINE 5 % EX PTCH
1 | MEDICATED_PATCH | Freq: Every day | TRANSDERMAL | 0 refills | 30.00000 days | Status: CP
Start: 2024-01-16 — End: ?

## 2024-01-18 MED ORDER — CLOPIDOGREL BISULFATE 75 MG PO TABS
75 mg | Freq: Every day | ORAL | 0 refills | 30.00000 days | Status: CP
Start: 2024-01-18 — End: ?

## 2024-01-18 MED ORDER — ATORVASTATIN CALCIUM 40 MG PO TABS
40 mg | Freq: Every day | ORAL | 0 refills | 90.00000 days | Status: CP
Start: 2024-01-18 — End: ?

## 2024-02-15 DIAGNOSIS — I2510 Atherosclerotic heart disease of native coronary artery without angina pectoris: Secondary | ICD-10-CM

## 2024-02-15 DIAGNOSIS — K219 Gastro-esophageal reflux disease without esophagitis: Secondary | ICD-10-CM

## 2024-02-15 DIAGNOSIS — C439 Malignant melanoma of skin, unspecified: Secondary | ICD-10-CM

## 2024-02-15 DIAGNOSIS — C4491 Basal cell carcinoma of skin, unspecified: Secondary | ICD-10-CM

## 2024-02-15 DIAGNOSIS — E785 Hyperlipidemia, unspecified: Secondary | ICD-10-CM

## 2024-02-15 DIAGNOSIS — E119 Type 2 diabetes mellitus without complications: Secondary | ICD-10-CM

## 2024-02-15 DIAGNOSIS — M1990 Unspecified osteoarthritis, unspecified site: Secondary | ICD-10-CM

## 2024-02-15 DIAGNOSIS — N400 Enlarged prostate without lower urinary tract symptoms: Secondary | ICD-10-CM

## 2024-02-15 DIAGNOSIS — M79671 Pain in right foot: Principal | ICD-10-CM

## 2024-02-15 DIAGNOSIS — F419 Anxiety disorder, unspecified: Secondary | ICD-10-CM

## 2024-02-15 DIAGNOSIS — G629 Polyneuropathy, unspecified: Secondary | ICD-10-CM

## 2024-02-15 DIAGNOSIS — I739 Peripheral vascular disease, unspecified: Secondary | ICD-10-CM

## 2024-02-15 DIAGNOSIS — Z9109 Other allergy status, other than to drugs and biological substances: Secondary | ICD-10-CM

## 2024-02-15 DIAGNOSIS — I10 Essential (primary) hypertension: Principal | ICD-10-CM

## 2024-02-15 DIAGNOSIS — Z9189 Other specified personal risk factors, not elsewhere classified: Secondary | ICD-10-CM

## 2024-02-15 DIAGNOSIS — I6350 Cerebral infarction due to unspecified occlusion or stenosis of unspecified cerebral artery: Secondary | ICD-10-CM

## 2024-02-15 DIAGNOSIS — M542 Cervicalgia: Secondary | ICD-10-CM

## 2024-02-15 DIAGNOSIS — N189 Chronic kidney disease, unspecified: Secondary | ICD-10-CM

## 2024-02-15 DIAGNOSIS — R011 Cardiac murmur, unspecified: Secondary | ICD-10-CM

## 2024-02-15 DIAGNOSIS — C4492 Squamous cell carcinoma of skin, unspecified: Secondary | ICD-10-CM

## 2024-02-15 DIAGNOSIS — I219 Acute myocardial infarction, unspecified: Secondary | ICD-10-CM

## 2024-02-16 ENCOUNTER — Inpatient Hospital Stay: Admit: 2024-02-16 | Discharge: 2024-02-16 | Payer: MEDICARE

## 2024-02-16 ENCOUNTER — Emergency Department: Admit: 2024-02-16 | Discharge: 2024-02-16 | Payer: MEDICARE

## 2024-02-17 ENCOUNTER — Ambulatory Visit: Payer: MEDICARE | Attending: Podiatrist | Primary: Family Medicine

## 2024-02-17 DIAGNOSIS — I10 Essential (primary) hypertension: Principal | ICD-10-CM

## 2024-02-17 DIAGNOSIS — I6350 Cerebral infarction due to unspecified occlusion or stenosis of unspecified cerebral artery: Secondary | ICD-10-CM

## 2024-02-17 DIAGNOSIS — Z9189 Other specified personal risk factors, not elsewhere classified: Secondary | ICD-10-CM

## 2024-02-17 DIAGNOSIS — R011 Cardiac murmur, unspecified: Secondary | ICD-10-CM

## 2024-02-17 DIAGNOSIS — E119 Type 2 diabetes mellitus without complications: Secondary | ICD-10-CM

## 2024-02-17 DIAGNOSIS — G629 Polyneuropathy, unspecified: Secondary | ICD-10-CM

## 2024-02-17 DIAGNOSIS — C4492 Squamous cell carcinoma of skin, unspecified: Secondary | ICD-10-CM

## 2024-02-17 DIAGNOSIS — S91104D Unspecified open wound of right lesser toe(s) without damage to nail, subsequent encounter: Secondary | ICD-10-CM

## 2024-02-17 DIAGNOSIS — M1990 Unspecified osteoarthritis, unspecified site: Secondary | ICD-10-CM

## 2024-02-17 DIAGNOSIS — M79671 Pain in right foot: Principal | ICD-10-CM

## 2024-02-17 DIAGNOSIS — Z9109 Other allergy status, other than to drugs and biological substances: Secondary | ICD-10-CM

## 2024-02-17 DIAGNOSIS — C4491 Basal cell carcinoma of skin, unspecified: Secondary | ICD-10-CM

## 2024-02-17 DIAGNOSIS — M2042 Other hammer toe(s) (acquired), left foot: Secondary | ICD-10-CM

## 2024-02-17 DIAGNOSIS — C439 Malignant melanoma of skin, unspecified: Secondary | ICD-10-CM

## 2024-02-17 DIAGNOSIS — M542 Cervicalgia: Secondary | ICD-10-CM

## 2024-02-17 DIAGNOSIS — N189 Chronic kidney disease, unspecified: Secondary | ICD-10-CM

## 2024-02-17 DIAGNOSIS — I739 Peripheral vascular disease, unspecified: Secondary | ICD-10-CM

## 2024-02-17 DIAGNOSIS — M795 Residual foreign body in soft tissue: Secondary | ICD-10-CM

## 2024-02-17 DIAGNOSIS — K219 Gastro-esophageal reflux disease without esophagitis: Secondary | ICD-10-CM

## 2024-02-17 DIAGNOSIS — I219 Acute myocardial infarction, unspecified: Secondary | ICD-10-CM

## 2024-02-17 DIAGNOSIS — I2510 Atherosclerotic heart disease of native coronary artery without angina pectoris: Secondary | ICD-10-CM

## 2024-02-17 DIAGNOSIS — N400 Enlarged prostate without lower urinary tract symptoms: Secondary | ICD-10-CM

## 2024-02-17 DIAGNOSIS — F419 Anxiety disorder, unspecified: Secondary | ICD-10-CM

## 2024-02-17 DIAGNOSIS — E785 Hyperlipidemia, unspecified: Secondary | ICD-10-CM

## 2024-02-19 MED ORDER — ONETOUCH ULTRA VI STRP
ORAL_STRIP | Freq: Four times a day (QID) | 1 refills | 30.00000 days | Status: CP
Start: 2024-02-19 — End: ?

## 2024-02-20 DIAGNOSIS — S90859A Superficial foreign body, unspecified foot, initial encounter: Principal | ICD-10-CM

## 2024-02-21 DIAGNOSIS — I2510 Atherosclerotic heart disease of native coronary artery without angina pectoris: Secondary | ICD-10-CM

## 2024-02-21 DIAGNOSIS — I739 Peripheral vascular disease, unspecified: Secondary | ICD-10-CM

## 2024-02-21 DIAGNOSIS — R011 Cardiac murmur, unspecified: Secondary | ICD-10-CM

## 2024-02-21 DIAGNOSIS — C4491 Basal cell carcinoma of skin, unspecified: Secondary | ICD-10-CM

## 2024-02-21 DIAGNOSIS — M542 Cervicalgia: Secondary | ICD-10-CM

## 2024-02-21 DIAGNOSIS — C4492 Squamous cell carcinoma of skin, unspecified: Secondary | ICD-10-CM

## 2024-02-21 DIAGNOSIS — F419 Anxiety disorder, unspecified: Secondary | ICD-10-CM

## 2024-02-21 DIAGNOSIS — I219 Acute myocardial infarction, unspecified: Secondary | ICD-10-CM

## 2024-02-21 DIAGNOSIS — E785 Hyperlipidemia, unspecified: Secondary | ICD-10-CM

## 2024-02-21 DIAGNOSIS — Z9189 Other specified personal risk factors, not elsewhere classified: Secondary | ICD-10-CM

## 2024-02-21 DIAGNOSIS — N189 Chronic kidney disease, unspecified: Secondary | ICD-10-CM

## 2024-02-21 DIAGNOSIS — K219 Gastro-esophageal reflux disease without esophagitis: Secondary | ICD-10-CM

## 2024-02-21 DIAGNOSIS — C439 Malignant melanoma of skin, unspecified: Secondary | ICD-10-CM

## 2024-02-21 DIAGNOSIS — I6350 Cerebral infarction due to unspecified occlusion or stenosis of unspecified cerebral artery: Secondary | ICD-10-CM

## 2024-02-21 DIAGNOSIS — E119 Type 2 diabetes mellitus without complications: Secondary | ICD-10-CM

## 2024-02-21 DIAGNOSIS — M1990 Unspecified osteoarthritis, unspecified site: Secondary | ICD-10-CM

## 2024-02-21 DIAGNOSIS — I10 Essential (primary) hypertension: Principal | ICD-10-CM

## 2024-02-21 DIAGNOSIS — Z9109 Other allergy status, other than to drugs and biological substances: Secondary | ICD-10-CM

## 2024-02-21 DIAGNOSIS — I25118 Atherosclerotic heart disease of native coronary artery with other forms of angina pectoris: Principal | ICD-10-CM

## 2024-02-21 DIAGNOSIS — N400 Enlarged prostate without lower urinary tract symptoms: Secondary | ICD-10-CM

## 2024-02-21 DIAGNOSIS — G629 Polyneuropathy, unspecified: Secondary | ICD-10-CM

## 2024-02-21 MED ORDER — LOSARTAN POTASSIUM 50 MG PO TABS
50 mg | Freq: Every day | ORAL | 3 refills | 90.00000 days | Status: CP
Start: 2024-02-21 — End: ?

## 2024-02-25 ENCOUNTER — Inpatient Hospital Stay: Admit: 2024-02-25 | Payer: MEDICARE | Primary: Family Medicine

## 2024-02-25 DIAGNOSIS — S90859A Superficial foreign body, unspecified foot, initial encounter: Principal | ICD-10-CM

## 2024-02-28 ENCOUNTER — Ambulatory Visit: Payer: MEDICARE | Attending: Family Medicine | Primary: Family Medicine

## 2024-02-28 DIAGNOSIS — N189 Chronic kidney disease, unspecified: Secondary | ICD-10-CM

## 2024-02-28 DIAGNOSIS — I10 Essential (primary) hypertension: Principal | ICD-10-CM

## 2024-02-28 DIAGNOSIS — F419 Anxiety disorder, unspecified: Secondary | ICD-10-CM

## 2024-02-28 DIAGNOSIS — I2510 Atherosclerotic heart disease of native coronary artery without angina pectoris: Secondary | ICD-10-CM

## 2024-02-28 DIAGNOSIS — Z9189 Other specified personal risk factors, not elsewhere classified: Secondary | ICD-10-CM

## 2024-02-28 DIAGNOSIS — E119 Type 2 diabetes mellitus without complications: Secondary | ICD-10-CM

## 2024-02-28 DIAGNOSIS — Z9109 Other allergy status, other than to drugs and biological substances: Secondary | ICD-10-CM

## 2024-02-28 DIAGNOSIS — C4492 Squamous cell carcinoma of skin, unspecified: Secondary | ICD-10-CM

## 2024-02-28 DIAGNOSIS — M1990 Unspecified osteoarthritis, unspecified site: Secondary | ICD-10-CM

## 2024-02-28 DIAGNOSIS — K219 Gastro-esophageal reflux disease without esophagitis: Secondary | ICD-10-CM

## 2024-02-28 DIAGNOSIS — G629 Polyneuropathy, unspecified: Secondary | ICD-10-CM

## 2024-02-28 DIAGNOSIS — C439 Malignant melanoma of skin, unspecified: Secondary | ICD-10-CM

## 2024-02-28 DIAGNOSIS — I219 Acute myocardial infarction, unspecified: Secondary | ICD-10-CM

## 2024-02-28 DIAGNOSIS — M170 Bilateral primary osteoarthritis of knee: Secondary | ICD-10-CM

## 2024-02-28 DIAGNOSIS — M542 Cervicalgia: Secondary | ICD-10-CM

## 2024-02-28 DIAGNOSIS — N400 Enlarged prostate without lower urinary tract symptoms: Secondary | ICD-10-CM

## 2024-02-28 DIAGNOSIS — R011 Cardiac murmur, unspecified: Secondary | ICD-10-CM

## 2024-02-28 DIAGNOSIS — E538 Deficiency of other specified B group vitamins: Secondary | ICD-10-CM

## 2024-02-28 DIAGNOSIS — I6350 Cerebral infarction due to unspecified occlusion or stenosis of unspecified cerebral artery: Secondary | ICD-10-CM

## 2024-02-28 DIAGNOSIS — C4491 Basal cell carcinoma of skin, unspecified: Secondary | ICD-10-CM

## 2024-02-28 DIAGNOSIS — E785 Hyperlipidemia, unspecified: Secondary | ICD-10-CM

## 2024-02-28 DIAGNOSIS — F411 Generalized anxiety disorder: Principal | ICD-10-CM

## 2024-02-28 DIAGNOSIS — M792 Neuralgia and neuritis, unspecified: Secondary | ICD-10-CM

## 2024-02-28 DIAGNOSIS — I739 Peripheral vascular disease, unspecified: Secondary | ICD-10-CM

## 2024-02-28 DIAGNOSIS — Z981 Arthrodesis status: Secondary | ICD-10-CM

## 2024-02-28 MED ORDER — HYDROCODONE-ACETAMINOPHEN 5-325 MG PO TABS
1 | ORAL_TABLET | Freq: Four times a day (QID) | ORAL | 0 refills | 30.00000 days | Status: CP | PRN
Start: 2024-02-28 — End: ?

## 2024-02-28 MED ORDER — HYDROCODONE-ACETAMINOPHEN 5-325 MG PO TABS
1 | ORAL_TABLET | ORAL | 0 refills | 30.00000 days | Status: CP | PRN
Start: 2024-02-28 — End: ?

## 2024-02-28 MED ORDER — TAMSULOSIN HCL 0.4 MG PO CAPS
0.4 mg | Freq: Every day | ORAL | 0 refills | 30.00000 days | Status: CP
Start: 2024-02-28 — End: ?

## 2024-02-28 MED ORDER — HYDROCODONE-ACETAMINOPHEN 5-325 MG PO TABS
1 | ORAL_TABLET | Freq: Four times a day (QID) | ORAL | 0 refills | 30.00000 days | Status: CP | PRN
Start: 2024-02-28 — End: 2024-02-28

## 2024-02-28 MED ORDER — LORAZEPAM 0.5 MG PO TABS
.5 mg | Freq: Every evening | ORAL | 1 refills | 15.50000 days | Status: CP
Start: 2024-02-28 — End: 2024-02-28

## 2024-02-28 MED ORDER — HYDROCODONE-ACETAMINOPHEN 5-325 MG PO TABS
1 | ORAL_TABLET | Freq: Four times a day (QID) | ORAL | 0 refills | Status: CN | PRN
Start: 2024-02-28 — End: ?

## 2024-02-28 MED ORDER — LORAZEPAM 0.5 MG PO TABS
.5 mg | Freq: Every evening | ORAL | 1 refills | 15.50000 days | Status: CP
Start: 2024-02-28 — End: ?

## 2024-02-28 MED ORDER — GABAPENTIN 600 MG PO TABS
600 mg | Freq: Three times a day (TID) | ORAL | 0 refills | 30.00000 days | Status: CP
Start: 2024-02-28 — End: ?

## 2024-02-28 MED ORDER — METOPROLOL SUCCINATE ER 50 MG PO TB24
50 mg | Freq: Two times a day (BID) | ORAL | 0 refills | 90.00000 days | Status: CP
Start: 2024-02-28 — End: ?

## 2024-03-07 DIAGNOSIS — I25118 Atherosclerotic heart disease of native coronary artery with other forms of angina pectoris: Secondary | ICD-10-CM

## 2024-03-07 MED ORDER — ATORVASTATIN CALCIUM 40 MG PO TABS
40 mg | Freq: Every day | ORAL | 0 refills | 90.00000 days | Status: CP
Start: 2024-03-07 — End: ?

## 2024-03-09 MED ORDER — ATORVASTATIN CALCIUM 40 MG PO TABS
40 mg | Freq: Every day | ORAL | 0 refills
Start: 2024-03-09 — End: ?

## 2024-03-09 MED ORDER — CLOPIDOGREL BISULFATE 75 MG PO TABS
75 mg | Freq: Every day | ORAL | 0 refills
Start: 2024-03-09 — End: ?

## 2024-03-09 MED ORDER — GABAPENTIN 600 MG PO TABS
600 mg | Freq: Three times a day (TID) | ORAL | 0 refills
Start: 2024-03-09 — End: ?

## 2024-03-10 ENCOUNTER — Encounter: Primary: Family Medicine

## 2024-03-12 MED ORDER — TAMSULOSIN HCL 0.4 MG PO CAPS
0.4 mg | Freq: Every day | ORAL | 0 refills | Status: CP
Start: 2024-03-12 — End: ?

## 2024-03-22 DIAGNOSIS — I25118 Atherosclerotic heart disease of native coronary artery with other forms of angina pectoris: Principal | ICD-10-CM

## 2024-03-22 DIAGNOSIS — I10 Essential (primary) hypertension: Secondary | ICD-10-CM

## 2024-03-22 MED ORDER — CLOPIDOGREL BISULFATE 75 MG PO TABS
75 mg | Freq: Every day | ORAL | 0 refills
Start: 2024-03-22 — End: ?

## 2024-03-22 MED ORDER — METOPROLOL SUCCINATE ER 50 MG PO TB24
50 mg | Freq: Two times a day (BID) | ORAL | 0 refills
Start: 2024-03-22 — End: ?

## 2024-04-03 DIAGNOSIS — E1121 Type 2 diabetes mellitus with diabetic nephropathy: Secondary | ICD-10-CM

## 2024-04-03 DIAGNOSIS — E872 Metabolic acidosis: Secondary | ICD-10-CM

## 2024-04-03 DIAGNOSIS — E875 Hyperkalemia: Secondary | ICD-10-CM

## 2024-04-03 DIAGNOSIS — N1832 Stage 3b chronic kidney disease: Principal | ICD-10-CM

## 2024-04-05 ENCOUNTER — Ambulatory Visit: Payer: MEDICARE | Attending: Student in an Organized Health Care Education/Training Program | Primary: Family Medicine

## 2024-04-05 DIAGNOSIS — N1832 Stage 3b chronic kidney disease: Secondary | ICD-10-CM

## 2024-04-05 DIAGNOSIS — F419 Anxiety disorder, unspecified: Secondary | ICD-10-CM

## 2024-04-05 DIAGNOSIS — I219 Acute myocardial infarction, unspecified: Secondary | ICD-10-CM

## 2024-04-05 DIAGNOSIS — M1990 Unspecified osteoarthritis, unspecified site: Secondary | ICD-10-CM

## 2024-04-05 DIAGNOSIS — Z9109 Other allergy status, other than to drugs and biological substances: Secondary | ICD-10-CM

## 2024-04-05 DIAGNOSIS — I6350 Cerebral infarction due to unspecified occlusion or stenosis of unspecified cerebral artery: Secondary | ICD-10-CM

## 2024-04-05 DIAGNOSIS — E119 Type 2 diabetes mellitus without complications: Secondary | ICD-10-CM

## 2024-04-05 DIAGNOSIS — I10 Essential (primary) hypertension: Principal | ICD-10-CM

## 2024-04-05 DIAGNOSIS — C4491 Basal cell carcinoma of skin, unspecified: Secondary | ICD-10-CM

## 2024-04-05 DIAGNOSIS — I739 Peripheral vascular disease, unspecified: Secondary | ICD-10-CM

## 2024-04-05 DIAGNOSIS — C4492 Squamous cell carcinoma of skin, unspecified: Secondary | ICD-10-CM

## 2024-04-05 DIAGNOSIS — M542 Cervicalgia: Secondary | ICD-10-CM

## 2024-04-05 DIAGNOSIS — C439 Malignant melanoma of skin, unspecified: Secondary | ICD-10-CM

## 2024-04-05 DIAGNOSIS — Z9189 Other specified personal risk factors, not elsewhere classified: Secondary | ICD-10-CM

## 2024-04-05 DIAGNOSIS — N189 Chronic kidney disease, unspecified: Secondary | ICD-10-CM

## 2024-04-05 DIAGNOSIS — I2510 Atherosclerotic heart disease of native coronary artery without angina pectoris: Secondary | ICD-10-CM

## 2024-04-05 DIAGNOSIS — G629 Polyneuropathy, unspecified: Secondary | ICD-10-CM

## 2024-04-05 DIAGNOSIS — N400 Enlarged prostate without lower urinary tract symptoms: Secondary | ICD-10-CM

## 2024-04-05 DIAGNOSIS — E1121 Type 2 diabetes mellitus with diabetic nephropathy: Secondary | ICD-10-CM

## 2024-04-05 DIAGNOSIS — E559 Vitamin D deficiency, unspecified: Secondary | ICD-10-CM

## 2024-04-05 DIAGNOSIS — K219 Gastro-esophageal reflux disease without esophagitis: Secondary | ICD-10-CM

## 2024-04-05 DIAGNOSIS — R011 Cardiac murmur, unspecified: Secondary | ICD-10-CM

## 2024-04-05 DIAGNOSIS — N179 Acute kidney failure, unspecified: Principal | ICD-10-CM

## 2024-04-05 DIAGNOSIS — E785 Hyperlipidemia, unspecified: Secondary | ICD-10-CM

## 2024-04-13 DIAGNOSIS — L84 Corns and callosities: Secondary | ICD-10-CM

## 2024-04-13 DIAGNOSIS — E118 Type 2 diabetes mellitus with unspecified complications: Secondary | ICD-10-CM

## 2024-04-13 DIAGNOSIS — M79672 Pain in left foot: Secondary | ICD-10-CM

## 2024-04-13 DIAGNOSIS — M2042 Other hammer toe(s) (acquired), left foot: Principal | ICD-10-CM

## 2024-04-13 MED ORDER — CEFAZOLIN SODIUM 1 G IJ SOLR
2 g | Freq: Once | INTRAVENOUS
Start: 2024-04-13 — End: ?

## 2024-04-13 MED ORDER — LACTATED RINGERS IV SOLN
1000 mL | INTRAVENOUS
Start: 2024-04-13 — End: ?

## 2024-04-16 DIAGNOSIS — I25118 Atherosclerotic heart disease of native coronary artery with other forms of angina pectoris: Secondary | ICD-10-CM

## 2024-04-16 DIAGNOSIS — I10 Essential (primary) hypertension: Principal | ICD-10-CM

## 2024-04-16 MED ORDER — CLOPIDOGREL BISULFATE 75 MG PO TABS
75 mg | Freq: Every day | ORAL | 0 refills
Start: 2024-04-16 — End: ?

## 2024-04-16 MED ORDER — METOPROLOL SUCCINATE ER 50 MG PO TB24
50 mg | Freq: Two times a day (BID) | ORAL | 0 refills
Start: 2024-04-16 — End: ?

## 2024-04-17 ENCOUNTER — Ambulatory Visit: Payer: MEDICARE | Attending: Family Medicine | Primary: Family Medicine

## 2024-04-22 ENCOUNTER — Inpatient Hospital Stay: Admit: 2024-04-22 | Payer: MEDICARE | Primary: Family Medicine

## 2024-04-22 DIAGNOSIS — N1832 Stage 3b chronic kidney disease: Secondary | ICD-10-CM

## 2024-04-22 DIAGNOSIS — N179 Acute kidney failure, unspecified: Principal | ICD-10-CM

## 2024-05-05 DIAGNOSIS — N179 Acute kidney failure, unspecified: Principal | ICD-10-CM

## 2024-05-05 DIAGNOSIS — N1832 Stage 3b chronic kidney disease: Secondary | ICD-10-CM

## 2024-05-10 DIAGNOSIS — I509 Heart failure, unspecified: Secondary | ICD-10-CM

## 2024-05-10 DIAGNOSIS — I10 Essential (primary) hypertension: Principal | ICD-10-CM

## 2024-05-10 DIAGNOSIS — N1831 Stage 3a chronic kidney disease: Principal | ICD-10-CM

## 2024-05-11 MED ORDER — LOSARTAN POTASSIUM 50 MG PO TABS
50 mg | Freq: Every day | ORAL | 3 refills | 90.00000 days | Status: CP
Start: 2024-05-11 — End: ?

## 2024-05-17 ENCOUNTER — Inpatient Hospital Stay: Payer: MEDICARE | Primary: Family Medicine

## 2024-05-18 ENCOUNTER — Inpatient Hospital Stay: Admit: 2024-05-18 | Payer: MEDICARE | Primary: Family Medicine

## 2024-05-18 DIAGNOSIS — I6350 Cerebral infarction due to unspecified occlusion or stenosis of unspecified cerebral artery: Secondary | ICD-10-CM

## 2024-05-18 DIAGNOSIS — E785 Hyperlipidemia, unspecified: Secondary | ICD-10-CM

## 2024-05-18 DIAGNOSIS — N400 Enlarged prostate without lower urinary tract symptoms: Secondary | ICD-10-CM

## 2024-05-18 DIAGNOSIS — G629 Polyneuropathy, unspecified: Secondary | ICD-10-CM

## 2024-05-18 DIAGNOSIS — N189 Chronic kidney disease, unspecified: Secondary | ICD-10-CM

## 2024-05-18 DIAGNOSIS — C4492 Squamous cell carcinoma of skin, unspecified: Secondary | ICD-10-CM

## 2024-05-18 DIAGNOSIS — E119 Type 2 diabetes mellitus without complications: Secondary | ICD-10-CM

## 2024-05-18 DIAGNOSIS — I739 Peripheral vascular disease, unspecified: Secondary | ICD-10-CM

## 2024-05-18 DIAGNOSIS — K219 Gastro-esophageal reflux disease without esophagitis: Secondary | ICD-10-CM

## 2024-05-18 DIAGNOSIS — I10 Essential (primary) hypertension: Principal | ICD-10-CM

## 2024-05-18 DIAGNOSIS — Z9109 Other allergy status, other than to drugs and biological substances: Secondary | ICD-10-CM

## 2024-05-18 DIAGNOSIS — F419 Anxiety disorder, unspecified: Secondary | ICD-10-CM

## 2024-05-18 DIAGNOSIS — Z9189 Other specified personal risk factors, not elsewhere classified: Secondary | ICD-10-CM

## 2024-05-18 DIAGNOSIS — M1990 Unspecified osteoarthritis, unspecified site: Secondary | ICD-10-CM

## 2024-05-18 DIAGNOSIS — I219 Acute myocardial infarction, unspecified: Secondary | ICD-10-CM

## 2024-05-18 DIAGNOSIS — R011 Cardiac murmur, unspecified: Secondary | ICD-10-CM

## 2024-05-18 DIAGNOSIS — I2510 Atherosclerotic heart disease of native coronary artery without angina pectoris: Secondary | ICD-10-CM

## 2024-05-18 DIAGNOSIS — C4491 Basal cell carcinoma of skin, unspecified: Secondary | ICD-10-CM

## 2024-05-18 DIAGNOSIS — M542 Cervicalgia: Secondary | ICD-10-CM

## 2024-05-18 DIAGNOSIS — C439 Malignant melanoma of skin, unspecified: Secondary | ICD-10-CM

## 2024-05-21 ENCOUNTER — Inpatient Hospital Stay: Admit: 2024-05-21 | Payer: MEDICARE | Primary: Family Medicine

## 2024-05-22 ENCOUNTER — Ambulatory Visit: Payer: MEDICARE | Attending: Family Medicine | Primary: Family Medicine

## 2024-05-22 DIAGNOSIS — G629 Polyneuropathy, unspecified: Secondary | ICD-10-CM

## 2024-05-22 DIAGNOSIS — E785 Hyperlipidemia, unspecified: Secondary | ICD-10-CM

## 2024-05-22 DIAGNOSIS — E119 Type 2 diabetes mellitus without complications: Secondary | ICD-10-CM

## 2024-05-22 DIAGNOSIS — N1832 Stage 3b chronic kidney disease: Secondary | ICD-10-CM

## 2024-05-22 DIAGNOSIS — Z6825 Body mass index (BMI) 25.0-25.9, adult: Principal | ICD-10-CM

## 2024-05-22 DIAGNOSIS — F419 Anxiety disorder, unspecified: Secondary | ICD-10-CM

## 2024-05-22 DIAGNOSIS — Z9109 Other allergy status, other than to drugs and biological substances: Secondary | ICD-10-CM

## 2024-05-22 DIAGNOSIS — E538 Deficiency of other specified B group vitamins: Secondary | ICD-10-CM

## 2024-05-22 DIAGNOSIS — Z981 Arthrodesis status: Secondary | ICD-10-CM

## 2024-05-22 DIAGNOSIS — M170 Bilateral primary osteoarthritis of knee: Secondary | ICD-10-CM

## 2024-05-22 DIAGNOSIS — Z9189 Other specified personal risk factors, not elsewhere classified: Secondary | ICD-10-CM

## 2024-05-22 DIAGNOSIS — M792 Neuralgia and neuritis, unspecified: Secondary | ICD-10-CM

## 2024-05-22 DIAGNOSIS — N189 Chronic kidney disease, unspecified: Secondary | ICD-10-CM

## 2024-05-22 DIAGNOSIS — M1990 Unspecified osteoarthritis, unspecified site: Secondary | ICD-10-CM

## 2024-05-22 DIAGNOSIS — I10 Essential (primary) hypertension: Secondary | ICD-10-CM

## 2024-05-22 DIAGNOSIS — E7849 Other hyperlipidemia: Secondary | ICD-10-CM

## 2024-05-22 DIAGNOSIS — N400 Enlarged prostate without lower urinary tract symptoms: Secondary | ICD-10-CM

## 2024-05-22 DIAGNOSIS — K219 Gastro-esophageal reflux disease without esophagitis: Secondary | ICD-10-CM

## 2024-05-22 DIAGNOSIS — I219 Acute myocardial infarction, unspecified: Secondary | ICD-10-CM

## 2024-05-22 DIAGNOSIS — C4492 Squamous cell carcinoma of skin, unspecified: Secondary | ICD-10-CM

## 2024-05-22 DIAGNOSIS — I739 Peripheral vascular disease, unspecified: Secondary | ICD-10-CM

## 2024-05-22 DIAGNOSIS — R011 Cardiac murmur, unspecified: Secondary | ICD-10-CM

## 2024-05-22 DIAGNOSIS — M542 Cervicalgia: Secondary | ICD-10-CM

## 2024-05-22 DIAGNOSIS — I6350 Cerebral infarction due to unspecified occlusion or stenosis of unspecified cerebral artery: Secondary | ICD-10-CM

## 2024-05-22 DIAGNOSIS — C439 Malignant melanoma of skin, unspecified: Secondary | ICD-10-CM

## 2024-05-22 DIAGNOSIS — C4491 Basal cell carcinoma of skin, unspecified: Secondary | ICD-10-CM

## 2024-05-22 DIAGNOSIS — I2510 Atherosclerotic heart disease of native coronary artery without angina pectoris: Secondary | ICD-10-CM

## 2024-05-22 DIAGNOSIS — I25118 Atherosclerotic heart disease of native coronary artery with other forms of angina pectoris: Secondary | ICD-10-CM

## 2024-05-22 MED ORDER — TAMSULOSIN HCL 0.4 MG PO CAPS
0.4 mg | Freq: Every day | ORAL | 3 refills | 30.00000 days | Status: CP
Start: 2024-05-22 — End: ?

## 2024-05-22 MED ORDER — ATORVASTATIN CALCIUM 40 MG PO TABS
40 mg | Freq: Every day | ORAL | 3 refills | 90.00000 days | Status: CP
Start: 2024-05-22 — End: ?

## 2024-05-22 MED ORDER — LANCETS MISC
ORAL_STRIP | 3 refills | Status: CP
Start: 2024-05-22 — End: ?

## 2024-05-22 MED ORDER — ACCU-CHEK GUIDE W/DEVICE KIT
SUBCUTANEOUS | 0 refills | Status: CP
Start: 2024-05-22 — End: ?

## 2024-05-22 MED ORDER — METOPROLOL SUCCINATE ER 50 MG PO TB24
50 mg | Freq: Two times a day (BID) | ORAL | 3 refills | 90.00000 days | Status: CP
Start: 2024-05-22 — End: ?

## 2024-05-22 MED ORDER — HYDROCODONE-ACETAMINOPHEN 5-325 MG PO TABS
1 | ORAL_TABLET | ORAL | 0 refills | 30.00000 days | Status: CP | PRN
Start: 2024-05-22 — End: ?

## 2024-05-22 MED ORDER — GLUCOSE BLOOD VI STRP
1 | ORAL_STRIP | Freq: Every day | 3 refills | 33.00000 days | Status: CP
Start: 2024-05-22 — End: ?

## 2024-05-22 MED ORDER — HYDROCODONE-ACETAMINOPHEN 5-325 MG PO TABS
1 | ORAL_TABLET | Freq: Four times a day (QID) | ORAL | 0 refills | 30.00000 days | Status: CP | PRN
Start: 2024-05-22 — End: 2024-05-22

## 2024-05-22 MED ORDER — HYDROCODONE-ACETAMINOPHEN 5-325 MG PO TABS
1 | ORAL_TABLET | Freq: Four times a day (QID) | ORAL | 0 refills | 30.00000 days | Status: CP | PRN
Start: 2024-05-22 — End: ?

## 2024-05-22 MED ORDER — HYDROCODONE-ACETAMINOPHEN 5-325 MG PO TABS
1 | ORAL_TABLET | ORAL | 0 refills | 30.00000 days | Status: CP | PRN
Start: 2024-05-22 — End: 2024-05-22

## 2024-05-22 MED ORDER — LOSARTAN POTASSIUM 50 MG PO TABS
50 mg | Freq: Every day | ORAL | 3 refills | 90.00000 days | Status: CP
Start: 2024-05-22 — End: ?

## 2024-05-22 MED ORDER — LIDOCAINE 5 % EX PTCH
1 | MEDICATED_PATCH | TRANSDERMAL | 2 refills | 30.00000 days | Status: CP
Start: 2024-05-22 — End: ?

## 2024-05-22 MED ORDER — AMLODIPINE BESYLATE 5 MG PO TABS
5 mg | Freq: Every day | ORAL | 3 refills | 90.00000 days | Status: CP
Start: 2024-05-22 — End: ?

## 2024-05-22 MED ORDER — CLOPIDOGREL BISULFATE 75 MG PO TABS
75 mg | Freq: Every day | ORAL | 3 refills | 60.00000 days | Status: CP
Start: 2024-05-22 — End: ?

## 2024-05-22 MED ORDER — GABAPENTIN 600 MG PO TABS
600 mg | Freq: Three times a day (TID) | ORAL | 0 refills | 30.00000 days | Status: CP
Start: 2024-05-22 — End: ?

## 2024-05-24 ENCOUNTER — Ambulatory Visit: Payer: MEDICARE

## 2024-05-24 DIAGNOSIS — N189 Chronic kidney disease, unspecified: Secondary | ICD-10-CM

## 2024-05-24 DIAGNOSIS — M1990 Unspecified osteoarthritis, unspecified site: Secondary | ICD-10-CM

## 2024-05-24 DIAGNOSIS — N400 Enlarged prostate without lower urinary tract symptoms: Secondary | ICD-10-CM

## 2024-05-24 DIAGNOSIS — Z9189 Other specified personal risk factors, not elsewhere classified: Secondary | ICD-10-CM

## 2024-05-24 DIAGNOSIS — I6350 Cerebral infarction due to unspecified occlusion or stenosis of unspecified cerebral artery: Secondary | ICD-10-CM

## 2024-05-24 DIAGNOSIS — E785 Hyperlipidemia, unspecified: Secondary | ICD-10-CM

## 2024-05-24 DIAGNOSIS — C4491 Basal cell carcinoma of skin, unspecified: Secondary | ICD-10-CM

## 2024-05-24 DIAGNOSIS — I219 Acute myocardial infarction, unspecified: Secondary | ICD-10-CM

## 2024-05-24 DIAGNOSIS — K219 Gastro-esophageal reflux disease without esophagitis: Secondary | ICD-10-CM

## 2024-05-24 DIAGNOSIS — E119 Type 2 diabetes mellitus without complications: Secondary | ICD-10-CM

## 2024-05-24 DIAGNOSIS — G629 Polyneuropathy, unspecified: Secondary | ICD-10-CM

## 2024-05-24 DIAGNOSIS — Z9109 Other allergy status, other than to drugs and biological substances: Secondary | ICD-10-CM

## 2024-05-24 DIAGNOSIS — M542 Cervicalgia: Secondary | ICD-10-CM

## 2024-05-24 DIAGNOSIS — I10 Essential (primary) hypertension: Principal | ICD-10-CM

## 2024-05-24 DIAGNOSIS — C4492 Squamous cell carcinoma of skin, unspecified: Secondary | ICD-10-CM

## 2024-05-24 DIAGNOSIS — I2510 Atherosclerotic heart disease of native coronary artery without angina pectoris: Secondary | ICD-10-CM

## 2024-05-24 DIAGNOSIS — C439 Malignant melanoma of skin, unspecified: Secondary | ICD-10-CM

## 2024-05-24 DIAGNOSIS — R011 Cardiac murmur, unspecified: Secondary | ICD-10-CM

## 2024-05-24 DIAGNOSIS — I739 Peripheral vascular disease, unspecified: Secondary | ICD-10-CM

## 2024-05-24 DIAGNOSIS — F419 Anxiety disorder, unspecified: Secondary | ICD-10-CM

## 2024-05-24 MED ORDER — CEFAZOLIN SODIUM 1 G IJ SOLR
2 g | Freq: Once | INTRAVENOUS | Status: DC
Start: 2024-05-24 — End: 2024-05-24

## 2024-05-24 MED ORDER — LACTATED RINGERS IV SOLN
1000 mL | INTRAVENOUS | Status: DC
Start: 2024-05-24 — End: 2024-05-24

## 2024-05-24 MED ORDER — LACTATED RINGERS IV SOLN
1000 mL | Freq: Once | INTRAVENOUS | Status: DC
Start: 2024-05-24 — End: 2024-05-24

## 2024-06-06 ENCOUNTER — Encounter: Payer: MEDICARE | Primary: Family Medicine

## 2024-06-08 ENCOUNTER — Encounter: Payer: MEDICARE | Attending: Podiatrist | Primary: Family Medicine

## 2024-06-18 ENCOUNTER — Encounter: Payer: MEDICARE | Attending: Nurse Practitioner | Primary: Family Medicine

## 2024-06-19 ENCOUNTER — Telehealth: Payer: MEDICARE | Primary: Family Medicine

## 2024-06-19 DIAGNOSIS — M542 Cervicalgia: Secondary | ICD-10-CM

## 2024-06-19 DIAGNOSIS — C4492 Squamous cell carcinoma of skin, unspecified: Secondary | ICD-10-CM

## 2024-06-19 DIAGNOSIS — Z9109 Other allergy status, other than to drugs and biological substances: Secondary | ICD-10-CM

## 2024-06-19 DIAGNOSIS — I2510 Atherosclerotic heart disease of native coronary artery without angina pectoris: Secondary | ICD-10-CM

## 2024-06-19 DIAGNOSIS — Z9189 Other specified personal risk factors, not elsewhere classified: Secondary | ICD-10-CM

## 2024-06-19 DIAGNOSIS — C4491 Basal cell carcinoma of skin, unspecified: Secondary | ICD-10-CM

## 2024-06-19 DIAGNOSIS — N400 Enlarged prostate without lower urinary tract symptoms: Secondary | ICD-10-CM

## 2024-06-19 DIAGNOSIS — E119 Type 2 diabetes mellitus without complications: Secondary | ICD-10-CM

## 2024-06-19 DIAGNOSIS — R011 Cardiac murmur, unspecified: Secondary | ICD-10-CM

## 2024-06-19 DIAGNOSIS — E785 Hyperlipidemia, unspecified: Secondary | ICD-10-CM

## 2024-06-19 DIAGNOSIS — I6350 Cerebral infarction due to unspecified occlusion or stenosis of unspecified cerebral artery: Secondary | ICD-10-CM

## 2024-06-19 DIAGNOSIS — K219 Gastro-esophageal reflux disease without esophagitis: Secondary | ICD-10-CM

## 2024-06-19 DIAGNOSIS — I219 Acute myocardial infarction, unspecified: Secondary | ICD-10-CM

## 2024-06-19 DIAGNOSIS — I25810 Atherosclerosis of coronary artery bypass graft(s) without angina pectoris: Principal | ICD-10-CM

## 2024-06-19 DIAGNOSIS — M1990 Unspecified osteoarthritis, unspecified site: Secondary | ICD-10-CM

## 2024-06-19 DIAGNOSIS — N189 Chronic kidney disease, unspecified: Secondary | ICD-10-CM

## 2024-06-19 DIAGNOSIS — G629 Polyneuropathy, unspecified: Secondary | ICD-10-CM

## 2024-06-19 DIAGNOSIS — C439 Malignant melanoma of skin, unspecified: Secondary | ICD-10-CM

## 2024-06-19 DIAGNOSIS — F419 Anxiety disorder, unspecified: Secondary | ICD-10-CM

## 2024-06-19 DIAGNOSIS — I10 Essential (primary) hypertension: Principal | ICD-10-CM

## 2024-06-19 DIAGNOSIS — I739 Peripheral vascular disease, unspecified: Secondary | ICD-10-CM

## 2024-06-25 DIAGNOSIS — I25810 Atherosclerosis of coronary artery bypass graft(s) without angina pectoris: Principal | ICD-10-CM

## 2024-06-28 ENCOUNTER — Encounter: Payer: MEDICARE | Primary: Family Medicine

## 2024-07-05 ENCOUNTER — Encounter: Payer: MEDICARE | Attending: Student in an Organized Health Care Education/Training Program | Primary: Family Medicine

## 2024-07-05 DIAGNOSIS — N1832 Stage 3b chronic kidney disease: Principal | ICD-10-CM

## 2024-07-05 DIAGNOSIS — E1121 Type 2 diabetes mellitus with diabetic nephropathy: Secondary | ICD-10-CM

## 2024-07-09 MED ORDER — ONETOUCH ULTRA VI STRP
ORAL_STRIP | Freq: Four times a day (QID) | ORAL | 1 refills | 30.00000 days | Status: CP
Start: 2024-07-09 — End: ?

## 2024-07-09 MED ORDER — CONTINUOUS GLUCOSE RECEIVER DEVI
1 | Freq: Once | 0 refills | 33.00000 days | Status: CP
Start: 2024-07-09 — End: ?

## 2024-07-09 MED ORDER — CONTINUOUS GLUCOSE SENSOR MISC
1 | 3 refills | 33.00000 days | Status: CP
Start: 2024-07-09 — End: ?

## 2024-07-12 ENCOUNTER — Ambulatory Visit: Payer: MEDICARE | Attending: Student in an Organized Health Care Education/Training Program | Primary: Family Medicine

## 2024-07-12 DIAGNOSIS — C439 Malignant melanoma of skin, unspecified: Secondary | ICD-10-CM

## 2024-07-12 DIAGNOSIS — Z9109 Other allergy status, other than to drugs and biological substances: Secondary | ICD-10-CM

## 2024-07-12 DIAGNOSIS — N1832 Stage 3b chronic kidney disease: Principal | ICD-10-CM

## 2024-07-12 DIAGNOSIS — F419 Anxiety disorder, unspecified: Secondary | ICD-10-CM

## 2024-07-12 DIAGNOSIS — I2510 Atherosclerotic heart disease of native coronary artery without angina pectoris: Secondary | ICD-10-CM

## 2024-07-12 DIAGNOSIS — Z9189 Other specified personal risk factors, not elsewhere classified: Secondary | ICD-10-CM

## 2024-07-12 DIAGNOSIS — K219 Gastro-esophageal reflux disease without esophagitis: Secondary | ICD-10-CM

## 2024-07-12 DIAGNOSIS — E875 Hyperkalemia: Secondary | ICD-10-CM

## 2024-07-12 DIAGNOSIS — I6350 Cerebral infarction due to unspecified occlusion or stenosis of unspecified cerebral artery: Secondary | ICD-10-CM

## 2024-07-12 DIAGNOSIS — C4491 Basal cell carcinoma of skin, unspecified: Secondary | ICD-10-CM

## 2024-07-12 DIAGNOSIS — N189 Chronic kidney disease, unspecified: Secondary | ICD-10-CM

## 2024-07-12 DIAGNOSIS — C4492 Squamous cell carcinoma of skin, unspecified: Secondary | ICD-10-CM

## 2024-07-12 DIAGNOSIS — N400 Enlarged prostate without lower urinary tract symptoms: Secondary | ICD-10-CM

## 2024-07-12 DIAGNOSIS — E785 Hyperlipidemia, unspecified: Secondary | ICD-10-CM

## 2024-07-12 DIAGNOSIS — E119 Type 2 diabetes mellitus without complications: Secondary | ICD-10-CM

## 2024-07-12 DIAGNOSIS — G629 Polyneuropathy, unspecified: Secondary | ICD-10-CM

## 2024-07-12 DIAGNOSIS — E1121 Type 2 diabetes mellitus with diabetic nephropathy: Secondary | ICD-10-CM

## 2024-07-12 DIAGNOSIS — M1990 Unspecified osteoarthritis, unspecified site: Secondary | ICD-10-CM

## 2024-07-12 DIAGNOSIS — R011 Cardiac murmur, unspecified: Secondary | ICD-10-CM

## 2024-07-12 DIAGNOSIS — I739 Peripheral vascular disease, unspecified: Secondary | ICD-10-CM

## 2024-07-12 DIAGNOSIS — M542 Cervicalgia: Secondary | ICD-10-CM

## 2024-07-12 DIAGNOSIS — I219 Acute myocardial infarction, unspecified: Secondary | ICD-10-CM

## 2024-07-12 DIAGNOSIS — I10 Essential (primary) hypertension: Principal | ICD-10-CM

## 2024-07-16 ENCOUNTER — Encounter: Payer: MEDICARE | Attending: Student in an Organized Health Care Education/Training Program | Primary: Family Medicine

## 2024-07-17 MED ORDER — DEXCOM G7 SENSOR MISC
0 refills
Start: 2024-07-17 — End: ?

## 2024-08-07 ENCOUNTER — Ambulatory Visit: Payer: MEDICARE | Attending: Family Medicine | Primary: Family Medicine

## 2024-08-07 DIAGNOSIS — R609 Edema, unspecified: Secondary | ICD-10-CM

## 2024-08-07 DIAGNOSIS — C4491 Basal cell carcinoma of skin, unspecified: Secondary | ICD-10-CM

## 2024-08-07 DIAGNOSIS — N189 Chronic kidney disease, unspecified: Secondary | ICD-10-CM

## 2024-08-07 DIAGNOSIS — C4492 Squamous cell carcinoma of skin, unspecified: Secondary | ICD-10-CM

## 2024-08-07 DIAGNOSIS — Z6825 Body mass index (BMI) 25.0-25.9, adult: Secondary | ICD-10-CM

## 2024-08-07 DIAGNOSIS — R011 Cardiac murmur, unspecified: Secondary | ICD-10-CM

## 2024-08-07 DIAGNOSIS — I739 Peripheral vascular disease, unspecified: Secondary | ICD-10-CM

## 2024-08-07 DIAGNOSIS — I10 Essential (primary) hypertension: Secondary | ICD-10-CM

## 2024-08-07 DIAGNOSIS — E785 Hyperlipidemia, unspecified: Secondary | ICD-10-CM

## 2024-08-07 DIAGNOSIS — I219 Acute myocardial infarction, unspecified: Secondary | ICD-10-CM

## 2024-08-07 DIAGNOSIS — C439 Malignant melanoma of skin, unspecified: Secondary | ICD-10-CM

## 2024-08-07 DIAGNOSIS — G629 Polyneuropathy, unspecified: Secondary | ICD-10-CM

## 2024-08-07 DIAGNOSIS — J329 Chronic sinusitis, unspecified: Secondary | ICD-10-CM

## 2024-08-07 DIAGNOSIS — Z85820 Personal history of malignant melanoma of skin: Secondary | ICD-10-CM

## 2024-08-07 DIAGNOSIS — Z981 Arthrodesis status: Principal | ICD-10-CM

## 2024-08-07 DIAGNOSIS — M792 Neuralgia and neuritis, unspecified: Secondary | ICD-10-CM

## 2024-08-07 DIAGNOSIS — N400 Enlarged prostate without lower urinary tract symptoms: Secondary | ICD-10-CM

## 2024-08-07 DIAGNOSIS — K219 Gastro-esophageal reflux disease without esophagitis: Secondary | ICD-10-CM

## 2024-08-07 DIAGNOSIS — I2510 Atherosclerotic heart disease of native coronary artery without angina pectoris: Secondary | ICD-10-CM

## 2024-08-07 DIAGNOSIS — Z9109 Other allergy status, other than to drugs and biological substances: Secondary | ICD-10-CM

## 2024-08-07 DIAGNOSIS — F419 Anxiety disorder, unspecified: Secondary | ICD-10-CM

## 2024-08-07 DIAGNOSIS — M542 Cervicalgia: Secondary | ICD-10-CM

## 2024-08-07 DIAGNOSIS — F411 Generalized anxiety disorder: Secondary | ICD-10-CM

## 2024-08-07 DIAGNOSIS — E538 Deficiency of other specified B group vitamins: Secondary | ICD-10-CM

## 2024-08-07 DIAGNOSIS — I6350 Cerebral infarction due to unspecified occlusion or stenosis of unspecified cerebral artery: Secondary | ICD-10-CM

## 2024-08-07 DIAGNOSIS — Z9189 Other specified personal risk factors, not elsewhere classified: Secondary | ICD-10-CM

## 2024-08-07 DIAGNOSIS — M1990 Unspecified osteoarthritis, unspecified site: Secondary | ICD-10-CM

## 2024-08-07 DIAGNOSIS — E119 Type 2 diabetes mellitus without complications: Secondary | ICD-10-CM

## 2024-08-07 MED ORDER — HYDROCODONE-ACETAMINOPHEN 5-325 MG PO TABS
1 | ORAL | 0 refills | 30.00000 days | Status: CP | PRN
Start: 2024-08-07 — End: 2024-08-07

## 2024-08-07 MED ORDER — HYDROCODONE-ACETAMINOPHEN 5-325 MG PO TABS
1 | ORAL | 0 refills | 30.00000 days | Status: CP | PRN
Start: 2024-08-07 — End: ?

## 2024-08-07 MED ORDER — LORAZEPAM 0.5 MG PO TABS
.5 mg | Freq: Every evening | ORAL | 1 refills | 4.00000 days | Status: CP
Start: 2024-08-07 — End: 2024-08-07

## 2024-08-07 MED ORDER — LORAZEPAM 0.5 MG PO TABS
.5 mg | Freq: Every evening | ORAL | 1 refills | 4.00000 days | Status: CP
Start: 2024-08-07 — End: ?

## 2024-08-07 MED ORDER — AZITHROMYCIN 250 MG PO TABS
ORAL | 0 refills | 5.00000 days | Status: CP
Start: 2024-08-07 — End: ?

## 2024-08-07 MED ORDER — FUROSEMIDE 20 MG PO TABS
ORAL | 0 refills | 30.00000 days | Status: CP
Start: 2024-08-07 — End: ?

## 2024-08-08 ENCOUNTER — Encounter: Payer: MEDICARE | Attending: Nurse Practitioner | Primary: Family Medicine

## 2024-08-09 ENCOUNTER — Ambulatory Visit: Payer: MEDICARE | Attending: Nurse Practitioner | Primary: Family Medicine

## 2024-08-09 DIAGNOSIS — I10 Essential (primary) hypertension: Secondary | ICD-10-CM

## 2024-08-09 DIAGNOSIS — C4491 Basal cell carcinoma of skin, unspecified: Secondary | ICD-10-CM

## 2024-08-09 DIAGNOSIS — E7849 Other hyperlipidemia: Secondary | ICD-10-CM

## 2024-08-09 DIAGNOSIS — Z9189 Other specified personal risk factors, not elsewhere classified: Secondary | ICD-10-CM

## 2024-08-09 DIAGNOSIS — I219 Acute myocardial infarction, unspecified: Secondary | ICD-10-CM

## 2024-08-09 DIAGNOSIS — I6350 Cerebral infarction due to unspecified occlusion or stenosis of unspecified cerebral artery: Secondary | ICD-10-CM

## 2024-08-09 DIAGNOSIS — R011 Cardiac murmur, unspecified: Secondary | ICD-10-CM

## 2024-08-09 DIAGNOSIS — N189 Chronic kidney disease, unspecified: Secondary | ICD-10-CM

## 2024-08-09 DIAGNOSIS — I739 Peripheral vascular disease, unspecified: Secondary | ICD-10-CM

## 2024-08-09 DIAGNOSIS — E119 Type 2 diabetes mellitus without complications: Secondary | ICD-10-CM

## 2024-08-09 DIAGNOSIS — N400 Enlarged prostate without lower urinary tract symptoms: Secondary | ICD-10-CM

## 2024-08-09 DIAGNOSIS — E785 Hyperlipidemia, unspecified: Secondary | ICD-10-CM

## 2024-08-09 DIAGNOSIS — G629 Polyneuropathy, unspecified: Secondary | ICD-10-CM

## 2024-08-09 DIAGNOSIS — I2510 Atherosclerotic heart disease of native coronary artery without angina pectoris: Secondary | ICD-10-CM

## 2024-08-09 DIAGNOSIS — K219 Gastro-esophageal reflux disease without esophagitis: Secondary | ICD-10-CM

## 2024-08-09 DIAGNOSIS — Z9109 Other allergy status, other than to drugs and biological substances: Secondary | ICD-10-CM

## 2024-08-09 DIAGNOSIS — N1832 Stage 3b chronic kidney disease: Secondary | ICD-10-CM

## 2024-08-09 DIAGNOSIS — C4492 Squamous cell carcinoma of skin, unspecified: Secondary | ICD-10-CM

## 2024-08-09 DIAGNOSIS — M542 Cervicalgia: Secondary | ICD-10-CM

## 2024-08-09 DIAGNOSIS — F419 Anxiety disorder, unspecified: Secondary | ICD-10-CM

## 2024-08-09 DIAGNOSIS — M1990 Unspecified osteoarthritis, unspecified site: Secondary | ICD-10-CM

## 2024-08-09 DIAGNOSIS — C439 Malignant melanoma of skin, unspecified: Secondary | ICD-10-CM

## 2024-08-09 DIAGNOSIS — Z4681 Encounter for fitting and adjustment of insulin pump: Secondary | ICD-10-CM

## 2024-08-09 MED ORDER — ONETOUCH ULTRA VI STRP
ORAL_STRIP | Freq: Four times a day (QID) | ORAL | 3 refills | Status: CP
Start: 2024-08-09 — End: ?

## 2024-08-09 MED ORDER — OZEMPIC (0.25 OR 0.5 MG/DOSE) 2 MG/3ML SC SOPN
.5 mg | SUBCUTANEOUS | 2 refills | 30.00000 days | Status: CP
Start: 2024-08-09 — End: ?

## 2024-08-09 MED ORDER — ACCU-CHEK AVIVA PLUS VI STRP
1 | ORAL_STRIP | Freq: Three times a day (TID) | 10 refills | Status: CN
Start: 2024-08-09 — End: ?

## 2024-08-14 ENCOUNTER — Encounter: Payer: MEDICARE | Attending: Nurse Practitioner | Primary: Family Medicine

## 2024-08-14 DIAGNOSIS — I25810 Atherosclerosis of coronary artery bypass graft(s) without angina pectoris: Principal | ICD-10-CM

## 2024-08-14 DIAGNOSIS — E785 Hyperlipidemia, unspecified: Secondary | ICD-10-CM

## 2024-08-16 MED ORDER — ACCU-CHEK GUIDE TEST VI STRP
1 | ORAL_STRIP | Freq: Four times a day (QID) | 3 refills | Status: CP
Start: 2024-08-16 — End: ?

## 2024-08-20 DIAGNOSIS — I10 Essential (primary) hypertension: Secondary | ICD-10-CM

## 2024-08-20 DIAGNOSIS — E7849 Other hyperlipidemia: Secondary | ICD-10-CM

## 2024-08-20 DIAGNOSIS — N1832 Stage 3b chronic kidney disease: Secondary | ICD-10-CM

## 2024-08-21 DIAGNOSIS — S91209S Unspecified open wound of unspecified toe(s) with damage to nail, sequela: Principal | ICD-10-CM
